# Patient Record
Sex: Male | Born: 1995 | Race: Black or African American | Hispanic: No | Marital: Single | Smoking: Current every day smoker
Health system: Southern US, Community
[De-identification: ages and names within clinical notes are randomized; demographics above are authoritative.]

## PROBLEM LIST (undated history)

## (undated) DIAGNOSIS — J45909 Unspecified asthma, uncomplicated: Secondary | ICD-10-CM

## (undated) DIAGNOSIS — F909 Attention-deficit hyperactivity disorder, unspecified type: Secondary | ICD-10-CM

## (undated) DIAGNOSIS — F319 Bipolar disorder, unspecified: Secondary | ICD-10-CM

## (undated) DIAGNOSIS — M1990 Unspecified osteoarthritis, unspecified site: Secondary | ICD-10-CM

## (undated) DIAGNOSIS — F39 Unspecified mood [affective] disorder: Secondary | ICD-10-CM

## (undated) DIAGNOSIS — F259 Schizoaffective disorder, unspecified: Secondary | ICD-10-CM

## (undated) DIAGNOSIS — F4310 Post-traumatic stress disorder, unspecified: Secondary | ICD-10-CM

## (undated) DIAGNOSIS — F329 Major depressive disorder, single episode, unspecified: Secondary | ICD-10-CM

## (undated) DIAGNOSIS — F913 Oppositional defiant disorder: Secondary | ICD-10-CM

## (undated) DIAGNOSIS — F32A Depression, unspecified: Secondary | ICD-10-CM

## (undated) HISTORY — DX: Unspecified mood (affective) disorder: F39

## (undated) HISTORY — DX: Bipolar disorder, unspecified: F31.9

## (undated) HISTORY — DX: Attention-deficit hyperactivity disorder, unspecified type: F90.9

## (undated) HISTORY — PX: NO PAST SURGERIES: SHX2092

## (undated) HISTORY — DX: Oppositional defiant disorder: F91.3

---
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
## 2013-01-07 ENCOUNTER — Emergency Department (HOSPITAL_COMMUNITY)
Admission: EM | Admit: 2013-01-07 | Discharge: 2013-01-07 | Disposition: A | Discharge status: Other Acute Inpatient Hospital | Payer: Medicaid Other | Attending: Emergency Medicine | Admitting: Emergency Medicine

## 2013-01-07 ENCOUNTER — Encounter (HOSPITAL_COMMUNITY): Payer: Self-pay | Admitting: Family Medicine

## 2013-01-07 DIAGNOSIS — R45851 Suicidal ideations: Secondary | ICD-10-CM | POA: Insufficient documentation

## 2013-01-07 DIAGNOSIS — M545 Low back pain, unspecified: Secondary | ICD-10-CM | POA: Insufficient documentation

## 2013-01-07 DIAGNOSIS — F913 Oppositional defiant disorder: Secondary | ICD-10-CM | POA: Insufficient documentation

## 2013-01-07 DIAGNOSIS — Z79899 Other long term (current) drug therapy: Secondary | ICD-10-CM | POA: Insufficient documentation

## 2013-01-07 DIAGNOSIS — F319 Bipolar disorder, unspecified: Secondary | ICD-10-CM | POA: Insufficient documentation

## 2013-01-07 DIAGNOSIS — F17200 Nicotine dependence, unspecified, uncomplicated: Secondary | ICD-10-CM | POA: Insufficient documentation

## 2013-01-07 DIAGNOSIS — Z8659 Personal history of other mental and behavioral disorders: Secondary | ICD-10-CM | POA: Insufficient documentation

## 2013-01-07 LAB — RAPID URINE DRUG SCREEN, HOSP PERFORMED
Cocaine: NOT DETECTED
Opiates: NOT DETECTED
Tetrahydrocannabinol: NOT DETECTED

## 2013-01-07 LAB — COMPREHENSIVE METABOLIC PANEL
ALT: 157 U/L — ABNORMAL HIGH (ref 0–53)
Albumin: 4.1 g/dL (ref 3.5–5.2)
Alkaline Phosphatase: 97 U/L (ref 39–117)
BUN: 9 mg/dL (ref 6–23)
Potassium: 3.9 mEq/L (ref 3.5–5.1)
Sodium: 139 mEq/L (ref 135–145)
Total Protein: 7.1 g/dL (ref 6.0–8.3)

## 2013-01-07 LAB — CBC
MCHC: 34.7 g/dL (ref 30.0–36.0)
RDW: 13.4 % (ref 11.5–15.5)

## 2013-01-07 LAB — ETHANOL: Alcohol, Ethyl (B): <11 mg/dL (ref 0–11)

## 2013-01-07 MED ORDER — LORAZEPAM 1 MG PO TABS
1.0000 mg | ORAL_TABLET | Freq: Once | ORAL | Status: AC
  Administered 2013-01-07: 1 mg via ORAL
  Filled 2013-01-07: qty 2

## 2013-01-07 MED ORDER — ACETAMINOPHEN 325 MG PO TABS
650.0000 mg | ORAL_TABLET | Freq: Once | ORAL | Status: AC
  Administered 2013-01-07: 650 mg via ORAL
  Filled 2013-01-07: qty 2

## 2013-01-07 NOTE — ED Provider Notes (Signed)
Disposition has not been able to be arranged and does not appear to be Advertising account executive. Patient will be transferred to [facility] where she can be kept in a psychiatric holding area pending disposition. Case is been discussed with Dr. Patria Mane who agrees to accept the patient in transfer.  Dione Booze, MD 01/07/13 2259

## 2013-01-07 NOTE — ED Notes (Signed)
Pt. Currently denies SI/HI. He does state that he does not have someone to pick him up. Allowed to take pillow and blanket with him to lobby to use while he waits for ride. All property returned to Pt. Before d/c.

## 2013-01-07 NOTE — BH Assessment (Signed)
Assessment Note   Ryan Bartlett is an 17 y.o. male that presented today after being discharged from [facility] voicing ongoing suicidal ideation with plan and intent to hang self.  Pt admits "I am always suicidal and have been since I was [age].  I put on my discharge plans plan to hang myself after discharge today."  Pt has had multiple previous attempts, including overdose that had him medically admitted at [facility] in 2013.  Pt admittedly has nowhere to stay and was not able to get a bed at [facility] and was sent to [facility] for placement.  Pt then presented to [facility].  Pt admits that he is unable to contract for safety and states "I have nothing to live for."  Pt denies Cannibus or alcohol use since his admission to [facility].  Clinician contacted [facility] and they are currently at capacity but encouraged Clinician to send referral for review.  Axis I: BiPolar II Disorder; r/o Cannibus Abuse Axis II: r/o AntiSocial Personality Disorder Axis III:  Past Medical History  Diagnosis Date  . ADHD (attention deficit hyperactivity disorder)   . Unspecified episodic mood disorder   . Oppositional defiant disorder   . Bipolar disorder    Axis IV: economic problems, housing problems, other psychosocial or environmental problems, problems related to legal system/crime, problems related to social environment and problems with primary support group Axis V: 31-40 impairment in reality testing  Past Medical History:  Past Medical History  Diagnosis Date  . ADHD (attention deficit hyperactivity disorder)   . Unspecified episodic mood disorder   . Oppositional defiant disorder   . Bipolar disorder     Past Surgical History  Procedure Laterality Date  . No past surgeries      Family History:  Family History  Problem Relation Age of Onset  . Depression Mother     Social History:  reports that he has been smoking Cigarettes.  He has a 2.5 pack-year smoking history. He does not have any smokeless tobacco  history on file. He reports that he does not drink alcohol or use illicit drugs.  Additional Social History:  Alcohol / Drug Use Pain Medications: Yes; see MAR Prescriptions: Yes; see MAR Over the Counter: PRN History of alcohol / drug use?: Yes Longest period of sobriety (when/how long): except for week at [facility] none in years Negative Consequences of Use: Financial;Legal;Personal relationships;Work / Science writer Symptoms: Agitation;Patient aware of relationship between substance abuse and physical/medical complications;Irritability Substance #1 Name of Substance 1: Cannibus 1 - Age of First Use: 12 1 - Amount (size/oz): 4-5 blunts 1 - Frequency: QD 1 - Duration: years 1 - Last Use / Amount: 9 days Substance #2 Name of Substance 2: ETOH 2 - Age of First Use: 12 2 - Amount (size/oz): several drinks 2 - Frequency: every few days 2 - Duration: years 2 - Last Use / Amount: last night- 5/20  CIWA: CIWA-Ar BP: 142/77 mmHg Pulse Rate: 117 COWS:    Allergies: No Known Allergies  Home Medications:  (Not in a hospital admission)  OB/GYN Status:  No LMP for male patient.  General Assessment Data Location of Assessment: [facility] ED Living Arrangements: Other (Comment) (Homeless on the street) Can pt return to current living arrangement?: No Admission Status: Voluntary Is patient capable of signing voluntary admission?: Yes Transfer from: Acute Hospital Referral Source: Other Museum/gallery curator)  Education Status Is patient currently in school?: Yes Current Grade: working on GED Highest grade of school patient has completed: 10th  Name of school: [employer]  Risk to self Suicidal Ideation: Yes-Currently Present Suicidal Intent: Yes-Currently Present Is patient at risk for suicide?: Yes Suicidal Plan?: Yes-Currently Present Specify Current Suicidal Plan: to hang self Access to Means: Yes Specify Access to Suicidal Means: available cords and blankets What has been your use of drugs/alcohol  within the last 12 months?: Cannibus and occassional alcohol use Previous Attempts/Gestures: Yes How many times?:  (multiple) Other Self Harm Risks: impulsive and destructive Triggers for Past Attempts: Other personal contacts;Unpredictable Intentional Self Injurious Behavior: Damaging Comment - Self Injurious Behavior: chronicity of mental illness Family Suicide History: No Recent stressful life event(s): Conflict (Comment);Turmoil (Comment) (released from [facility] while still suicidal; legal charges) Persecutory voices/beliefs?: Yes Depression: Yes Depression Symptoms: Guilt;Loss of interest in usual pleasures;Feeling worthless/self pity Substance abuse history and/or treatment for substance abuse?: Yes Suicide prevention information given to non-admitted patients: Not applicable  Risk to Others Homicidal Ideation: No Thoughts of Harm to Others: No Current Homicidal Intent: No Current Homicidal Plan: No Access to Homicidal Means: No Identified Victim: none per pt History of harm to others?: Yes Assessment of Violence: In past 6-12 months Violent Behavior Description: charges for resisting arrest Does patient have access to weapons?: Yes (Comment) (knives and possible guns available when not in ED) Criminal Charges Pending?: Yes Describe Pending Criminal Charges: Trespassing, Larceny and Conspiracy, Damage to Real Property, Resisting Arrest Does patient have a court date: No  Psychosis Hallucinations: Auditory (hears faint voices calling his name) Delusions: None noted  Mental Status Report Appear/Hygiene:  (casual in scrubs) Eye Contact: Good Motor Activity: Unremarkable Speech: Logical/coherent Level of Consciousness: Alert Mood: Depressed Affect: Apathetic;Depressed;Sad Anxiety Level: Minimal Thought Processes: Relevant Judgement: Impaired Orientation: Person;Place;Time;Situation Obsessive Compulsive Thoughts/Behaviors: Moderate  Cognitive Functioning Concentration:  Normal Memory: Recent Intact;Remote Intact IQ: Average Insight: Poor Impulse Control: Poor Appetite: Good Weight Loss: 0 Weight Gain: 0 Sleep: No Change Total Hours of Sleep:  ("about six") Vegetative Symptoms: None  ADLScreening [facility] Assessment Services) Patient's cognitive ability adequate to safely complete daily activities?: Yes Patient able to express need for assistance with ADLs?: Yes Independently performs ADLs?: Yes (appropriate for developmental age)  Abuse/Neglect [facility]) Physical Abuse: Denies Verbal Abuse: Denies Sexual Abuse: Denies  Prior Inpatient Therapy Prior Inpatient Therapy: Yes Prior Therapy Dates: discharged today Prior Therapy Facilty/Provider(s): [facility] today and multiple other hospitalizations Reason for Treatment: SI  Prior Outpatient Therapy Prior Outpatient Therapy: Yes Prior Therapy Dates: currently Prior Therapy Facilty/Provider(s): [facility] Reason for Treatment: ongoing and chronic Si  ADL Screening (condition at time of admission) Patient's cognitive ability adequate to safely complete daily activities?: Yes Patient able to express need for assistance with ADLs?: Yes Independently performs ADLs?: Yes (appropriate for developmental age) Weakness of Legs: None Weakness of Arms/Hands: None       Abuse/Neglect Assessment (Assessment to be complete while patient is alone) Physical Abuse: Denies Verbal Abuse: Denies Sexual Abuse: Denies Exploitation of patient/patient's resources: Denies Self-Neglect: Denies Values / Beliefs Cultural Requests During Hospitalization: None Spiritual Requests During Hospitalization: None   Advance Directives (For Healthcare) Advance Directive: Patient does not have advance directive    Additional Information 1:1 In Past 12 Months?: Yes CIRT Risk: No Elopement Risk: No Does patient have medical clearance?: Yes     Disposition:  Please consider for inpatient treatment. Disposition Initial Assessment  Completed for this Encounter: Yes Disposition of Patient: Inpatient treatment program Type of inpatient treatment program: Adult  On Site Evaluation by:   Reviewed with Physician:  Angelica Ran 01/07/2013 6:41 PM

## 2013-01-07 NOTE — ED Notes (Signed)
Report given to K. Cobb, RN. 

## 2013-01-07 NOTE — ED Notes (Signed)
Called house coverage to verify if sitter had been called by triage nurse. She had not so sitter was requested.

## 2013-01-07 NOTE — ED Notes (Signed)
Report received from Ulla Gallo, RN-

## 2013-01-07 NOTE — ED Notes (Signed)
Per pt he has been having thoughts of killing himself. sts [facility] sent him here. Denies plan.

## 2013-01-07 NOTE — ED Provider Notes (Signed)
History    This chart was scribed for non-physician practitioner, Roxy Horseman PA-C  working with Vida Roller, MD by Donne Anon, ED Scribe. This patient was seen in room TR06C/TR06C and the patient's care was started at 1415.   CSN: [id number]  Arrival date & time 01/07/13  1420   First MD Initiated Contact with Patient 01/07/13 1454      Chief Complaint  Patient presents with  . Suicidal     The history is provided by the patient. No language interpreter was used.   HPI Comments: Ryan Bartlett is a 17 y.o. male who presents to the Emergency Department complaining of SI since he was 12. He denies any recent stressors. He was discharged from [facility] in [place] earlier today and he stated he said that if he was discharged he would hang himself. He states he still feels like harming himself. He is compliant with is medication. He denies HI. He states that he is experiencing gradual onset, mild lower back pain currently. He has tried 800 mg Ibuprofen with little relief. He states he used marijuana but stopped 2 weeks ago when he entered [facility]. He states he drinks alcohol on occasion. He denies CP, difficulty breathing, fever, nausea, vomiting or any other pain.   Past Medical History  Diagnosis Date  . ADHD (attention deficit hyperactivity disorder)   . Unspecified episodic mood disorder   . Oppositional defiant disorder   . Bipolar disorder     Past Surgical History  Procedure Laterality Date  . No past surgeries      Family History  Problem Relation Age of Onset  . Depression Mother     History  Substance Use Topics  . Smoking status: Current Some Day Smoker -- 0.50 packs/day for 5 years    Types: Cigarettes  . Smokeless tobacco: Not on file  . Alcohol Use: No     Comment: Pt denies use      Review of Systems  Respiratory: Negative for shortness of breath.   Cardiovascular: Negative for chest pain.  Gastrointestinal: Negative for nausea and  vomiting.  Musculoskeletal: Positive for back pain.  Psychiatric/Behavioral: Positive for suicidal ideas.  All other systems reviewed and are negative.    Allergies  Review of patient's allergies indicates no known allergies.  Home Medications   Current Outpatient Rx  Name  Route  Sig  Dispense  Refill  . ibuprofen (ADVIL,MOTRIN) 200 MG tablet   Oral   Take 200 mg by mouth every 6 (six) hours as needed for pain.         Marland Kitchen PARoxetine (PAXIL) 10 MG tablet   Oral   Take 10 mg by mouth every morning.           BP 142/77  Pulse 117  Temp(Src) 98.3 F (36.8 C)  Resp 18  SpO2 98%  Physical Exam  Nursing note and vitals reviewed. Constitutional: He is oriented to person, place, and time. He appears well-developed and well-nourished. No distress.  HENT:  Head: Normocephalic and atraumatic.  Eyes: EOM are normal.  Neck: Neck supple. No tracheal deviation present.  Cardiovascular: Normal heart sounds.   Tachycardiac   Pulmonary/Chest: Effort normal and breath sounds normal. No respiratory distress. He has no wheezes. He has no rales. He exhibits no tenderness.  Abdominal: Soft. He exhibits no distension. There is no tenderness.  Musculoskeletal: Normal range of motion.  Right sided rhomboid muscle groups tender to palpation.  Neurological: He is  alert and oriented to person, place, and time.  Sensation in strength intact throughout.  Skin: Skin is warm and dry.  Psychiatric: He has a normal mood and affect. His behavior is normal.    ED Course  Procedures (including critical care time) DIAGNOSTIC STUDIES: Oxygen Saturation is 98% on room air, normal by my interpretation.    COORDINATION OF CARE: 3:20 PM We are going to move the pt to pod C. Will discuss pt with ACT team. Order basic labs. Will reevaluate.   Labs Reviewed  CBC  COMPREHENSIVE METABOLIC PANEL  ETHANOL  ACETAMINOPHEN LEVEL  SALICYLATE LEVEL  URINE RAPID DRUG SCREEN (HOSP PERFORMED)   Results  for orders placed during the hospital encounter of 01/07/13  CBC      Result Value Range   WBC 7.6  4.0 - 10.5 K/uL   RBC 4.98  4.22 - 5.81 MIL/uL   Hemoglobin 14.2  13.0 - 17.0 g/dL   HCT 19.1  47.8 - 29.5 %   MCV 82.1  78.0 - 100.0 fL   MCH 28.5  26.0 - 34.0 pg   MCHC 34.7  30.0 - 36.0 g/dL   RDW 62.1  30.8 - 65.7 %   Platelets 201  150 - 400 K/uL  COMPREHENSIVE METABOLIC PANEL      Result Value Range   Sodium 139  135 - 145 mEq/L   Potassium 3.9  3.5 - 5.1 mEq/L   Chloride 102  96 - 112 mEq/L   CO2 25  19 - 32 mEq/L   Glucose, Bld 121 (*) 70 - 99 mg/dL   BUN 9  6 - 23 mg/dL   Creatinine, Ser 8.46  0.50 - 1.35 mg/dL   Calcium 9.2  8.4 - 96.2 mg/dL   Total Protein 7.1  6.0 - 8.3 g/dL   Albumin 4.1  3.5 - 5.2 g/dL   AST 47 (*) 0 - 37 U/L   ALT 157 (*) 0 - 53 U/L   Alkaline Phosphatase 97  39 - 117 U/L   Total Bilirubin 0.4  0.3 - 1.2 mg/dL   GFR calc non Af Amer >90  >90 mL/min   GFR calc Af Amer >90  >90 mL/min  ETHANOL      Result Value Range   Alcohol, Ethyl (B) <11  0 - 11 mg/dL  ACETAMINOPHEN LEVEL      Result Value Range   Acetaminophen (Tylenol), Serum <15.0  10 - 30 ug/mL  SALICYLATE LEVEL      Result Value Range   Salicylate Lvl <2.0 (*) 2.8 - 20.0 mg/dL  URINE RAPID DRUG SCREEN (HOSP PERFORMED)      Result Value Range   Opiates NONE DETECTED  NONE DETECTED   Cocaine NONE DETECTED  NONE DETECTED   Benzodiazepines NONE DETECTED  NONE DETECTED   Amphetamines NONE DETECTED  NONE DETECTED   Tetrahydrocannabinol NONE DETECTED  NONE DETECTED   Barbiturates NONE DETECTED  NONE DETECTED     Filed Vitals:   01/07/13 1830  BP: 127/78  Pulse: 80  Temp: 97.4 F (36.3 C)  Resp: 18     1. Suicidal behavior       MDM  Patient with SI. Plan is to consult the activity. Will order telepsych.  Patient medically clear.  Anticipate admit to [facility].  I personally performed the services described in this documentation, which was scribed in my presence. The recorded  information has been reviewed and is accurate.  Roxy Horseman, PA-C 01/07/13 2024

## 2013-01-07 NOTE — ED Notes (Addendum)
Pt states "I want to kill myself...Marland KitchenMarland KitchenI'm going to hang myself". When questioned why, he said "because I don't have anywhere to go". Pt was discharged from [facility] today,where he has been for 8 days. Before that, he was at [facility]. Pt HAS BEEN WANDED. Belongings at nurses station.

## 2013-01-08 ENCOUNTER — Encounter (HOSPITAL_COMMUNITY): Payer: Self-pay | Admitting: Emergency Medicine

## 2013-01-08 ENCOUNTER — Emergency Department (HOSPITAL_COMMUNITY)
Admission: EM | Admit: 2013-01-08 | Discharge: 2013-01-08 | Disposition: A | Discharge status: Home / Self Care | Payer: Medicaid Other | Attending: Emergency Medicine | Admitting: Emergency Medicine

## 2013-01-08 DIAGNOSIS — M545 Low back pain, unspecified: Secondary | ICD-10-CM | POA: Insufficient documentation

## 2013-01-08 DIAGNOSIS — Z8659 Personal history of other mental and behavioral disorders: Secondary | ICD-10-CM | POA: Insufficient documentation

## 2013-01-08 DIAGNOSIS — Z5900 Homelessness unspecified: Secondary | ICD-10-CM | POA: Insufficient documentation

## 2013-01-08 DIAGNOSIS — Z79899 Other long term (current) drug therapy: Secondary | ICD-10-CM | POA: Insufficient documentation

## 2013-01-08 DIAGNOSIS — F913 Oppositional defiant disorder: Secondary | ICD-10-CM | POA: Insufficient documentation

## 2013-01-08 DIAGNOSIS — F319 Bipolar disorder, unspecified: Secondary | ICD-10-CM | POA: Insufficient documentation

## 2013-01-08 DIAGNOSIS — F17200 Nicotine dependence, unspecified, uncomplicated: Secondary | ICD-10-CM | POA: Insufficient documentation

## 2013-01-08 DIAGNOSIS — F39 Unspecified mood [affective] disorder: Secondary | ICD-10-CM | POA: Insufficient documentation

## 2013-01-08 MED ORDER — CARBAMAZEPINE 200 MG PO TABS
200.0000 mg | ORAL_TABLET | Freq: Three times a day (TID) | ORAL | Status: DC
  Administered 2013-01-08: 200 mg via ORAL
  Filled 2013-01-08 (×3): qty 1

## 2013-01-08 MED ORDER — ZOLPIDEM TARTRATE 5 MG PO TABS: 5.0000 mg | ORAL_TABLET | Freq: Every evening | ORAL | Status: DC | PRN

## 2013-01-08 MED ORDER — ONDANSETRON HCL 4 MG PO TABS: 4.0000 mg | ORAL_TABLET | Freq: Three times a day (TID) | ORAL | Status: DC | PRN

## 2013-01-08 MED ORDER — ACETAMINOPHEN 325 MG PO TABS: 650.0000 mg | ORAL_TABLET | ORAL | Status: DC | PRN

## 2013-01-08 MED ORDER — QUETIAPINE FUMARATE 200 MG PO TABS
200.0000 mg | ORAL_TABLET | Freq: Every day | ORAL | Status: DC
Start: 2013-01-08 — End: 2013-01-08

## 2013-01-08 MED ORDER — PAROXETINE HCL 20 MG PO TABS
20.0000 mg | ORAL_TABLET | ORAL | Status: DC
  Administered 2013-01-08: 20 mg via ORAL
  Filled 2013-01-08 (×2): qty 1

## 2013-01-08 MED ORDER — NICOTINE 21 MG/24HR TD PT24
21.0000 mg | MEDICATED_PATCH | Freq: Every day | TRANSDERMAL | Status: DC
  Administered 2013-01-08: 21 mg via TRANSDERMAL
  Filled 2013-01-08: qty 1

## 2013-01-08 MED ORDER — IBUPROFEN 400 MG PO TABS: 600.0000 mg | ORAL_TABLET | Freq: Three times a day (TID) | ORAL | Status: DC | PRN

## 2013-01-08 MED ORDER — ALUM & MAG HYDROXIDE-SIMETH 200-200-20 MG/5ML PO SUSP: 30.0000 mL | ORAL | Status: DC | PRN

## 2013-01-08 NOTE — Progress Notes (Signed)
CSW met with the Pt at the bedside for homeless resources and referrals to [facility]: specifically ACT TEAM referral.   CSW had contacted Elissa Lovett [phone] at [facility] and Ms. Earna Coder would like Pt referral information faxed to [phone] for review. Ms. Earna Coder also stated that Pt would be a perfect candidate for ACT TEAM services.   Pt is aware of resources for ACT TEAM and gave permission to send information for referral. Pt was given two bus passes and information for [employer] and [facility] contact numbers.   Pt appreciative and voiced understanding of importance of updating [facility] of his location for assistance.   No further CSW needs.   Leron Croak, LCSWA [facility] Emergency Dept.  [phone]

## 2013-01-08 NOTE — ED Provider Notes (Signed)
Medical screening examination/treatment/procedure(s) were performed by non-physician practitioner and as supervising physician I was immediately available for consultation/collaboration.  Lesslie Mckeehan, MD 01/08/13 2134 

## 2013-01-08 NOTE — BH Assessment (Signed)
BHH Assessment Progress Note      ACT was consulted to see pt pending telepsych recommendations.  Pt received telepsych and discharge with outpatient referrals recommended as well as a social work consult for homeless resources.  Referrals given to pt and social work to be notified.

## 2013-01-08 NOTE — ED Notes (Signed)
Patient states he has been having trouble with suicidal thoughts since he was 17 years old. Patient has attempted in the past he states. Patients plan this morning was to take all of his medication that he has. Patient denies any thoughts of hurting anyone else. Patient states he has no triggers that causes him to have the suicidal thoughts he just feels like dying. Patient was placed in blue scrubs and all belongings in bags. Patient does have stress ball in room to help relieve with anxiety and stress. All cords from monitor and phone were removed from room. Will continue to monitor.

## 2013-01-08 NOTE — ED Provider Notes (Signed)
Medical screening examination/treatment/procedure(s) were performed by non-physician practitioner and as supervising physician I was immediately available for consultation/collaboration.    Vida Roller, MD 01/08/13 1359

## 2013-01-08 NOTE — ED Notes (Signed)
House coverage aware of pt.

## 2013-01-08 NOTE — ED Provider Notes (Signed)
History     CSN: [id number]  Arrival date & time 01/08/13  4098   First MD Initiated Contact with Patient 01/08/13 [phone]      Chief Complaint  Patient presents with  . Suicidal    (Consider location/radiation/quality/duration/timing/severity/associated sxs/prior treatment) HPI  Ryan Bartlett is a 17 y.o. male who admits to being homeless, presents to the Emergency Department complaining of SI since he was 12. He denies any recent stressors. He was discharged from [facility] in [place] two days ago and then ALSO discharged from our ER last night after a Telepsych said he could be discharged, slept in the waiting room and checked back in this morning.  He stated he said last night that if he was discharged he would hang himself. This morning he says he will overdose on his Seroquel.     Marland Kitchen He is compliant with is medication. He denies HI. He states that he is experiencing gradual onset, mild lower back pain currently. He has tried 800 mg Ibuprofen with little relief. He states he used marijuana but stopped 2 weeks ago.when he entered [facility]. He states he drinks alcohol on occasion. He denies CP, difficulty breathing, fever, nausea, vomiting or any other pain.    Past Medical History  Diagnosis Date  . ADHD (attention deficit hyperactivity disorder)   . Unspecified episodic mood disorder   . Oppositional defiant disorder   . Bipolar disorder     Past Surgical History  Procedure Laterality Date  . No past surgeries      Family History  Problem Relation Age of Onset  . Depression Mother     History  Substance Use Topics  . Smoking status: Current Some Day Smoker -- 0.50 packs/day for 5 years    Types: Cigarettes  . Smokeless tobacco: Not on file  . Alcohol Use: No     Comment: Pt denies use      Review of Systems  All other systems reviewed and are negative.    Allergies  Peanut-containing drug products and Lactose intolerance (gi)  Home Medications    Current Outpatient Rx  Name  Route  Sig  Dispense  Refill  . carbamazepine (TEGRETOL) 200 MG tablet   Oral   Take 200 mg by mouth 3 (three) times daily.         . cyclobenzaprine (FLEXERIL) 5 MG tablet   Oral   Take 5 mg by mouth 3 (three) times daily as needed for muscle spasms. For spasms         . ibuprofen (ADVIL,MOTRIN) 200 MG tablet   Oral   Take 800 mg by mouth every 6 (six) hours as needed for pain.          Marland Kitchen PARoxetine (PAXIL) 20 MG tablet   Oral   Take 20 mg by mouth every morning.         Marland Kitchen QUEtiapine (SEROQUEL) 100 MG tablet   Oral   Take 200 mg by mouth at bedtime.           BP 132/86  Pulse 82  Temp(Src) 97.7 F (36.5 C) (Oral)  Resp 19  SpO2 99%  Physical Exam  Nursing note and vitals reviewed. Constitutional: He appears well-developed and well-nourished. No distress.  HENT:  Head: Normocephalic and atraumatic.  Eyes: Pupils are equal, round, and reactive to light.  Neck: Normal range of motion. Neck supple.  Cardiovascular: Normal rate and regular rhythm.   Pulmonary/Chest: Effort normal.  Abdominal: Soft.  Neurological: He is alert.  Skin: Skin is warm and dry.  Psychiatric: He exhibits a depressed mood. He expresses suicidal ideation. He expresses no homicidal ideation. He expresses suicidal plans. He expresses no homicidal plans.    ED Course  Procedures (including critical care time)  Labs Reviewed - No data to display No results found.   1. Homelessness   2. Suicidal ideation       MDM   ACt consulted. Recommend Telepsych. If patient is recommended dc by telepsych will consult Social Work.  Holding orders placed. Med rec completed.      Dorthula Matas, PA-C 01/08/13 [phone]

## 2013-01-08 NOTE — ED Notes (Signed)
Patient comes in stating he is suicidal. Patient was seen here last night for same thing, was then discharged. Stayed out in the waiting room throughout the night woke up and feelings of wanting to hurt his self again. States his plan is take his medication all at once. Patient states he is homeless. Has been seen several times for these feelings.

## 2013-01-08 NOTE — BH Assessment (Signed)
BHH Assessment Progress Note      Counselor was working on placement with [facility] and [facility] (pt was recently dc from there, but currently no beds available).  Charge nurse told this Clinical research associate that he would be moved to [facility] for holding due to increasing number of patients and bed availability in their psych ED.  Charge nurse later informed this Clinical research associate that patient had been discharged.

## 2013-01-11 ENCOUNTER — Emergency Department (HOSPITAL_COMMUNITY)
Admission: EM | Admit: 2013-01-11 | Discharge: 2013-01-12 | Disposition: A | Discharge status: Other Acute Inpatient Hospital | Payer: Medicaid Other | Attending: Emergency Medicine | Admitting: Emergency Medicine

## 2013-01-11 ENCOUNTER — Encounter (HOSPITAL_COMMUNITY): Payer: Self-pay | Admitting: Emergency Medicine

## 2013-01-11 DIAGNOSIS — T1491XA Suicide attempt, initial encounter: Secondary | ICD-10-CM

## 2013-01-11 DIAGNOSIS — F909 Attention-deficit hyperactivity disorder, unspecified type: Secondary | ICD-10-CM | POA: Insufficient documentation

## 2013-01-11 DIAGNOSIS — F3162 Bipolar disorder, current episode mixed, moderate: Secondary | ICD-10-CM

## 2013-01-11 DIAGNOSIS — Z8659 Personal history of other mental and behavioral disorders: Secondary | ICD-10-CM | POA: Insufficient documentation

## 2013-01-11 DIAGNOSIS — F17200 Nicotine dependence, unspecified, uncomplicated: Secondary | ICD-10-CM | POA: Insufficient documentation

## 2013-01-11 DIAGNOSIS — F902 Attention-deficit hyperactivity disorder, combined type: Secondary | ICD-10-CM

## 2013-01-11 DIAGNOSIS — F912 Conduct disorder, adolescent-onset type: Secondary | ICD-10-CM | POA: Insufficient documentation

## 2013-01-11 DIAGNOSIS — IMO0002 Reserved for concepts with insufficient information to code with codable children: Secondary | ICD-10-CM | POA: Insufficient documentation

## 2013-01-11 DIAGNOSIS — R45851 Suicidal ideations: Secondary | ICD-10-CM | POA: Insufficient documentation

## 2013-01-11 DIAGNOSIS — F319 Bipolar disorder, unspecified: Secondary | ICD-10-CM | POA: Insufficient documentation

## 2013-01-11 LAB — BASIC METABOLIC PANEL
Calcium: 9.4 mg/dL (ref 8.4–10.5)
GFR calc non Af Amer: >90 mL/min (ref >90)
Glucose, Bld: 104 mg/dL — ABNORMAL HIGH (ref 70–99)
Potassium: 4.1 mEq/L (ref 3.5–5.1)
Sodium: 140 mEq/L (ref 135–145)

## 2013-01-11 LAB — RAPID URINE DRUG SCREEN, HOSP PERFORMED
Amphetamines: NOT DETECTED
Barbiturates: NOT DETECTED
Benzodiazepines: NOT DETECTED

## 2013-01-11 LAB — URINALYSIS, ROUTINE W REFLEX MICROSCOPIC
Bilirubin Urine: NEGATIVE
Glucose, UA: NEGATIVE mg/dL
Ketones, ur: NEGATIVE mg/dL
Leukocytes, UA: NEGATIVE
Nitrite: NEGATIVE
Protein, ur: NEGATIVE mg/dL
pH: 7.5 (ref 5.0–8.0)

## 2013-01-11 LAB — CBC WITH DIFFERENTIAL/PLATELET
Hemoglobin: 13.1 g/dL (ref 13.0–17.0)
Lymphocytes Relative: 25 % (ref 12–46)
Lymphs Abs: 1.9 10*3/uL (ref 0.7–4.0)
Monocytes Relative: 11 % (ref 3–12)
Neutro Abs: 4.9 10*3/uL (ref 1.7–7.7)
Neutrophils Relative %: 65 % (ref 43–77)
Platelets: 210 10*3/uL (ref 150–400)
RBC: 4.86 MIL/uL (ref 4.22–5.81)
WBC: 7.5 10*3/uL (ref 4.0–10.5)

## 2013-01-11 MED ORDER — NICOTINE 21 MG/24HR TD PT24
21.0000 mg | MEDICATED_PATCH | Freq: Every day | TRANSDERMAL | Status: DC
Start: 2013-01-12 — End: 2013-01-12
  Administered 2013-01-12: 21 mg via TRANSDERMAL
  Filled 2013-01-11: qty 1

## 2013-01-11 MED ORDER — ZOLPIDEM TARTRATE 5 MG PO TABS: 5.0000 mg | ORAL_TABLET | Freq: Every evening | ORAL | Status: DC | PRN

## 2013-01-11 MED ORDER — ACETAMINOPHEN 325 MG PO TABS: 650.0000 mg | ORAL_TABLET | ORAL | Status: DC | PRN

## 2013-01-11 MED ORDER — LORAZEPAM 1 MG PO TABS: 1.0000 mg | ORAL_TABLET | Freq: Three times a day (TID) | ORAL | Status: DC | PRN

## 2013-01-11 MED ORDER — IBUPROFEN 600 MG PO TABS
600.0000 mg | ORAL_TABLET | Freq: Three times a day (TID) | ORAL | Status: DC | PRN
  Administered 2013-01-12 (×2): 600 mg via ORAL
  Filled 2013-01-11 (×2): qty 1

## 2013-01-11 MED ORDER — ONDANSETRON HCL 4 MG PO TABS: 4.0000 mg | ORAL_TABLET | Freq: Three times a day (TID) | ORAL | Status: DC | PRN

## 2013-01-11 NOTE — ED Notes (Signed)
Move to RM 38

## 2013-01-11 NOTE — ED Notes (Signed)
Pt states that he began feeling suicidal at 12. Has several plans including overdose, hanging, and other previous methods. Pt wwent to [facility] at 0500 this morning and was waiting for admission when he was sent home. Pt came to [facility].

## 2013-01-11 NOTE — ED Notes (Addendum)
Pt transferred from triage presents, SI with plan to electrocute self., Denies HI.  Pt states [facility] Dced him and he presented here for futher evaluation. Admits to drinking 4 40 0z beers per day., marijuana & cocaine abuse. Feeling hopeless. Diag. With Bipolar, Anxiety, Depression. Pt on home monitor,ankle bracelet as a stipulation for being out.  Wil not elaborate further. Pt also reports he has tried SI with the past 30 days by running into brick walls.  Pt calm & cooperative at present. Tech Art at bedside while bracelet is charging.

## 2013-01-11 NOTE — ED Notes (Signed)
Pt has prescriptions for his psy meds in with his belongings.

## 2013-01-11 NOTE — ED Provider Notes (Signed)
History     CSN: [id number]  Arrival date & time 01/11/13  2049   First MD Initiated Contact with Patient 01/11/13 2135      Chief Complaint  Patient presents with  . Suicidal    (Consider location/radiation/quality/duration/timing/severity/associated sxs/prior treatment) HPI Comments: Patient with long standing Psy Hx presents tonight stating he has a plan to electrocute himself with his ankle monitor.  He was at [facility] this morning and discharged , he was at [facility] for 3 days and discharged 5 days ago.  Currently staying at the shelter, did not finish high school, his mother put him out of her home and does not work    The history is provided by the patient.    Past Medical History  Diagnosis Date  . ADHD (attention deficit hyperactivity disorder)   . Unspecified episodic mood disorder   . Oppositional defiant disorder   . Bipolar disorder     Past Surgical History  Procedure Laterality Date  . No past surgeries      Family History  Problem Relation Age of Onset  . Depression Mother     History  Substance Use Topics  . Smoking status: Current Every Day Smoker -- 1.00 packs/day for 5 years    Types: Cigarettes  . Smokeless tobacco: Never Used  . Alcohol Use: 3.6 oz/week    6 Cans of beer per week      Review of Systems  Constitutional: Negative for activity change and appetite change.  Eyes: Negative for visual disturbance.  Respiratory: Negative for cough.   Gastrointestinal: Negative for abdominal pain.  Skin: Negative for rash.  Neurological: Negative for headaches.  Psychiatric/Behavioral: Positive for suicidal ideas.    Allergies  Peanut-containing drug products and Lactose intolerance (gi)  Home Medications   No current outpatient prescriptions on file.  BP 125/81  Pulse 83  Temp(Src) 98.6 F (37 C) (Oral)  Resp 16  SpO2 100%  Physical Exam  Nursing note and vitals reviewed. Constitutional: He appears well-developed and  well-nourished.  HENT:  Head: Normocephalic and atraumatic.  Eyes: Pupils are equal, round, and reactive to light.  Neck: Normal range of motion.  Cardiovascular: Normal rate and regular rhythm.   Pulmonary/Chest: Effort normal and breath sounds normal.  Abdominal: Soft. Bowel sounds are normal.  Musculoskeletal: Normal range of motion. He exhibits no edema and no tenderness.  Electric monitoring device R ankle  Skin: Skin is warm and dry.  Psychiatric: He has a normal mood and affect. His speech is normal. He is agitated. Cognition and memory are normal. He expresses inappropriate judgment. He expresses suicidal ideation. He expresses suicidal plans.    ED Course  Procedures (including critical care time)  Labs Reviewed  URINALYSIS, ROUTINE W REFLEX MICROSCOPIC - Abnormal; Notable for the following:    Urobilinogen, UA 2.0 (*)    All other components within normal limits  BASIC METABOLIC PANEL - Abnormal; Notable for the following:    Glucose, Bld 104 (*)    All other components within normal limits  URINE RAPID DRUG SCREEN (HOSP PERFORMED)  CBC WITH DIFFERENTIAL   No results found.   1. Suicide attempt   2. ADHD (attention deficit hyperactivity disorder), combined type   3. Bipolar affective disorder, mixed, moderate   4. Conduct disorder, adolescent onset type   5. Suicidal ideation       MDM           Arman Filter, NP 01/16/13 [phone]

## 2013-01-12 ENCOUNTER — Encounter (HOSPITAL_COMMUNITY): Payer: Self-pay | Admitting: Behavioral Health

## 2013-01-12 ENCOUNTER — Inpatient Hospital Stay (HOSPITAL_COMMUNITY)
Admission: AD | Admit: 2013-01-12 | Discharge: 2013-01-19 | DRG: 885 | Disposition: A | Discharge status: Home / Self Care | Payer: Medicaid Other | Source: Intra-hospital | Attending: Psychiatry | Admitting: Psychiatry

## 2013-01-12 DIAGNOSIS — F329 Major depressive disorder, single episode, unspecified: Secondary | ICD-10-CM

## 2013-01-12 DIAGNOSIS — T1491XA Suicide attempt, initial encounter: Secondary | ICD-10-CM

## 2013-01-12 DIAGNOSIS — R45851 Suicidal ideations: Secondary | ICD-10-CM

## 2013-01-12 DIAGNOSIS — R443 Hallucinations, unspecified: Secondary | ICD-10-CM

## 2013-01-12 DIAGNOSIS — F909 Attention-deficit hyperactivity disorder, unspecified type: Secondary | ICD-10-CM | POA: Diagnosis present

## 2013-01-12 DIAGNOSIS — Z79899 Other long term (current) drug therapy: Secondary | ICD-10-CM

## 2013-01-12 DIAGNOSIS — F919 Conduct disorder, unspecified: Secondary | ICD-10-CM | POA: Diagnosis present

## 2013-01-12 DIAGNOSIS — F39 Unspecified mood [affective] disorder: Secondary | ICD-10-CM

## 2013-01-12 DIAGNOSIS — F912 Conduct disorder, adolescent-onset type: Secondary | ICD-10-CM

## 2013-01-12 DIAGNOSIS — F902 Attention-deficit hyperactivity disorder, combined type: Secondary | ICD-10-CM | POA: Diagnosis present

## 2013-01-12 DIAGNOSIS — F3162 Bipolar disorder, current episode mixed, moderate: Principal | ICD-10-CM | POA: Diagnosis present

## 2013-01-12 DIAGNOSIS — F1210 Cannabis abuse, uncomplicated: Secondary | ICD-10-CM | POA: Diagnosis present

## 2013-01-12 MED ORDER — HYDROXYZINE HCL 25 MG PO TABS: 25.0000 mg | ORAL_TABLET | Freq: Four times a day (QID) | ORAL | Status: DC | PRN

## 2013-01-12 MED ORDER — MAGNESIUM HYDROXIDE 400 MG/5ML PO SUSP: 30.0000 mL | Freq: Every day | ORAL | Status: DC | PRN

## 2013-01-12 MED ORDER — PAROXETINE HCL 30 MG PO TABS
30.0000 mg | ORAL_TABLET | Freq: Every day | ORAL | Status: DC
  Administered 2013-01-13: 30 mg via ORAL
  Filled 2013-01-12: qty 1
  Filled 2013-01-12: qty 3
  Filled 2013-01-12: qty 1

## 2013-01-12 MED ORDER — VITAMIN B-1 100 MG PO TABS
100.0000 mg | ORAL_TABLET | Freq: Every day | ORAL | Status: DC
  Filled 2013-01-12: qty 1

## 2013-01-12 MED ORDER — ONDANSETRON 4 MG PO TBDP: 4.0000 mg | ORAL_TABLET | Freq: Four times a day (QID) | ORAL | Status: DC | PRN

## 2013-01-12 MED ORDER — ADULT MULTIVITAMIN W/MINERALS CH
1.0000 | ORAL_TABLET | Freq: Every day | ORAL | Status: DC
  Filled 2013-01-12 (×2): qty 1

## 2013-01-12 MED ORDER — ALUM & MAG HYDROXIDE-SIMETH 200-200-20 MG/5ML PO SUSP
30.0000 mL | ORAL | Status: DC | PRN
  Administered 2013-01-12: 30 mL via ORAL

## 2013-01-12 MED ORDER — LOPERAMIDE HCL 2 MG PO CAPS: 2.0000 mg | ORAL_CAPSULE | ORAL | Status: DC | PRN

## 2013-01-12 MED ORDER — QUETIAPINE FUMARATE ER 200 MG PO TB24
200.0000 mg | ORAL_TABLET | Freq: Every day | ORAL | Status: DC
  Filled 2013-01-12: qty 1

## 2013-01-12 MED ORDER — QUETIAPINE FUMARATE ER 200 MG PO TB24
200.0000 mg | ORAL_TABLET | Freq: Every day | ORAL | Status: DC
  Administered 2013-01-12: 200 mg via ORAL
  Filled 2013-01-12 (×3): qty 1

## 2013-01-12 MED ORDER — CARBAMAZEPINE 200 MG PO TABS
200.0000 mg | ORAL_TABLET | Freq: Three times a day (TID) | ORAL | Status: DC
  Administered 2013-01-13: 200 mg via ORAL
  Filled 2013-01-12 (×5): qty 1

## 2013-01-12 MED ORDER — THIAMINE HCL 100 MG/ML IJ SOLN: 100.0000 mg | Freq: Once | INTRAMUSCULAR | Status: DC

## 2013-01-12 MED ORDER — ACETAMINOPHEN 325 MG PO TABS
650.0000 mg | ORAL_TABLET | Freq: Four times a day (QID) | ORAL | Status: DC | PRN
  Administered 2013-01-14: 650 mg via ORAL

## 2013-01-12 MED ORDER — CHLORDIAZEPOXIDE HCL 25 MG PO CAPS: 25.0000 mg | ORAL_CAPSULE | Freq: Four times a day (QID) | ORAL | Status: DC | PRN

## 2013-01-12 MED ORDER — CARBAMAZEPINE 200 MG PO TABS
200.0000 mg | ORAL_TABLET | Freq: Three times a day (TID) | ORAL | Status: DC
  Administered 2013-01-12: 200 mg via ORAL
  Filled 2013-01-12 (×3): qty 1

## 2013-01-12 MED ORDER — PAROXETINE HCL 30 MG PO TABS
30.0000 mg | ORAL_TABLET | Freq: Every day | ORAL | Status: DC
  Administered 2013-01-12: 30 mg via ORAL
  Filled 2013-01-12 (×2): qty 1

## 2013-01-12 NOTE — Progress Notes (Addendum)
CSW left message for [facility] on crisis line, and unable to reach [facility] on local lines of [phone] or [phone] or [phone]. CSW called [employer] who is also unable to reach [employer] regarding patient. CSW discussed case with NP who plans to evaluate patient. Patient continues to endorse SI with plan to electrocute self. Patient also continues to endorse AH, telling patient to electrocute self.   Catha Gosselin, LCSWA  [phone] .01/12/2013 11:41am

## 2013-01-12 NOTE — Tx Team (Signed)
Initial Interdisciplinary Treatment Plan  PATIENT STRENGTHS: (choose at least two) Ability for insight Capable of independent living Motivation for treatment/growth  PATIENT STRESSORS: Educational concerns Financial difficulties Substance abuse   PROBLEM LIST: Problem List/Patient Goals Date to be addressed Date deferred Reason deferred Estimated date of resolution  SI 01/12/13     Psychosis 01/12/13                                                DISCHARGE CRITERIA:  Ability to meet basic life and health needs Adequate post-discharge living arrangements Improved stabilization in mood, thinking, and/or behavior Verbal commitment to aftercare and medication compliance  PRELIMINARY DISCHARGE PLAN: Attend aftercare/continuing care group Attend PHP/IOP  PATIENT/FAMIILY INVOLVEMENT: This treatment plan has been presented to and reviewed with the patient, Ryan Bartlett, and/or family member.  The patient and family have been given the opportunity to ask questions and make suggestions.  Takyah Ciaramitaro L 01/12/2013, 5:58 PM

## 2013-01-12 NOTE — Progress Notes (Addendum)
Addendum: EDP discussed with AC, patient accepted to [facility] as EDP and NP feel patient needs inpatient treatment.  Catha Gosselin, Theresia Majors  [phone] .01/12/2013 1532pm  Addendum: CSW discussed with AC, stating that patient was declined by administration. Per administration requesting telepsych consult. Patient was seen by NP who recommended in patient. CSW informed EDP. EDP requesting to speak with [facility] administration. Patient shared with CSW that he is currently residing at [facility] and would like to return when psychiatrically stable. CSW confirmed with [facility] that patient is a resident there, and patient can return when stable for discharge.   Patient accepted to 400-1 [facility] to Dr. Jannifer Franklin. CSW informed Rn and EDP. CSW completed support paperwork and faxed to [facility] and given to RN.   Marland KitchenCatha Gosselin, Theresia Majors  [phone] 01/12/2013.d 1457pm

## 2013-01-12 NOTE — BH Assessment (Signed)
BHH Assessment Progress Note  Pt was ran by Rosey Bath at [facility], per [place] a telepsych needed to be done. Per Tresa Endo if the telepsych recommends inpt treatment then the patient needed to referred back to [facility], and if [facility] had no beds then seek placement elsewhere. All information was reported to Melynda Ripple at [facility].

## 2013-01-12 NOTE — ED Notes (Signed)
telepsych cancelled, report called to [facility] for admission.

## 2013-01-12 NOTE — ED Provider Notes (Signed)
Alert, pleasant cooperative, [phone]. Offers no complaint  Doug Sou, MD 01/12/13 [phone]

## 2013-01-12 NOTE — Consult Note (Signed)
Reason for Consult: Suicidal thought Referring Physician: KAMARIAN SAHAKIAN is an 17 y.o. male.  HPI: 17 years old [name] was brought in from [facility] yesterday by an Technical sales engineer for endorsing suicidal thoughts with plans to electrocute himself.  He was discharged from [facility] and requested to be brought here.  There is ongoing hx of same presentation at [facility] hospital ER after he was discharged from [facility].  Today during this encounter, he re[ports he  still feeling suicidal and plan is to electrocute himself.  He reports his depression is 10/10 and he also stated his medications does not relieve his depression or relieve his suicidal thoughts.  He reports he is homeless but later stated he stays at a shelter but does not feel safe  going back there because of the thoughts of hurting himself.  He denies HI/Visual hallucination.  We will monitor for safety at this time and reevaluate for appropriate disposition.  Past Medical History  Diagnosis Date  . ADHD (attention deficit hyperactivity disorder)   . Unspecified episodic mood disorder   . Oppositional defiant disorder   . Bipolar disorder     Past Surgical History  Procedure Laterality Date  . No past surgeries      Family History  Problem Relation Age of Onset  . Depression Mother     Social History:  reports that he has been smoking Cigarettes.  He has a 5 pack-year smoking history. He has never used smokeless tobacco. He reports that he drinks about 3.6 ounces of alcohol per week. He reports that he uses illicit drugs (Marijuana and Cocaine).  Allergies:  Allergies  Allergen Reactions  . Peanut-Containing Drug Products Anaphylaxis  . Lactose Intolerance (Gi) Diarrhea and Nausea And Vomiting    Medications: I have reviewed the patient's current medications.  Results for orders placed during the hospital encounter of 01/11/13 (from the past 48 hour(s))  URINALYSIS, ROUTINE W REFLEX MICROSCOPIC     Status:  Abnormal   Collection Time    01/11/13  9:21 PM      Result Value Range   Color, Urine YELLOW  YELLOW   APPearance CLEAR  CLEAR   Specific Gravity, Urine 1.030  1.005 - 1.030   pH 7.5  5.0 - 8.0   Glucose, UA NEGATIVE  NEGATIVE mg/dL   Hgb urine dipstick NEGATIVE  NEGATIVE   Bilirubin Urine NEGATIVE  NEGATIVE   Ketones, ur NEGATIVE  NEGATIVE mg/dL   Protein, ur NEGATIVE  NEGATIVE mg/dL   Urobilinogen, UA 2.0 (*) 0.0 - 1.0 mg/dL   Nitrite NEGATIVE  NEGATIVE   Leukocytes, UA NEGATIVE  NEGATIVE   Comment: MICROSCOPIC NOT DONE ON URINES WITH NEGATIVE PROTEIN, BLOOD, LEUKOCYTES, NITRITE, OR GLUCOSE <1000 mg/dL.  URINE RAPID DRUG SCREEN (HOSP PERFORMED)     Status: None   Collection Time    01/11/13  9:21 PM      Result Value Range   Opiates NONE DETECTED  NONE DETECTED   Cocaine NONE DETECTED  NONE DETECTED   Benzodiazepines NONE DETECTED  NONE DETECTED   Amphetamines NONE DETECTED  NONE DETECTED   Tetrahydrocannabinol NONE DETECTED  NONE DETECTED   Barbiturates NONE DETECTED  NONE DETECTED   Comment:            DRUG SCREEN FOR MEDICAL PURPOSES     ONLY.  IF CONFIRMATION IS NEEDED     FOR ANY PURPOSE, NOTIFY LAB     WITHIN 5 DAYS.  LOWEST DETECTABLE LIMITS     FOR URINE DRUG SCREEN     Drug Class       Cutoff (ng/mL)     Amphetamine      1000     Barbiturate      200     Benzodiazepine   200     Tricyclics       300     Opiates          300     Cocaine          300     THC              50  BASIC METABOLIC PANEL     Status: Abnormal   Collection Time    01/11/13 10:18 PM      Result Value Range   Sodium 140  135 - 145 mEq/L   Potassium 4.1  3.5 - 5.1 mEq/L   Chloride 103  96 - 112 mEq/L   CO2 28  19 - 32 mEq/L   Glucose, Bld 104 (*) 70 - 99 mg/dL   BUN 12  6 - 23 mg/dL   Creatinine, Ser 1.61  0.50 - 1.35 mg/dL   Calcium 9.4  8.4 - 09.6 mg/dL   GFR calc non Af Amer >90  >90 mL/min   GFR calc Af Amer >90  >90 mL/min   Comment:            The eGFR has  been calculated     using the CKD EPI equation.     This calculation has not been     validated in all clinical     situations.     eGFR's persistently     <90 mL/min signify     possible Chronic Kidney Disease.  CBC WITH DIFFERENTIAL     Status: None   Collection Time    01/11/13 10:18 PM      Result Value Range   WBC 7.5  4.0 - 10.5 K/uL   RBC 4.86  4.22 - 5.81 MIL/uL   Hemoglobin 13.1  13.0 - 17.0 g/dL   HCT 04.5  40.9 - 81.1 %   MCV 82.1  78.0 - 100.0 fL   MCH 27.0  26.0 - 34.0 pg   MCHC 32.8  30.0 - 36.0 g/dL   RDW 91.4  78.2 - 95.6 %   Platelets 210  150 - 400 K/uL   Neutrophils Relative % 65  43 - 77 %   Neutro Abs 4.9  1.7 - 7.7 K/uL   Lymphocytes Relative 25  12 - 46 %   Lymphs Abs 1.9  0.7 - 4.0 K/uL   Monocytes Relative 11  3 - 12 %   Monocytes Absolute 0.8  0.1 - 1.0 K/uL   Eosinophils Relative 0  0 - 5 %   Eosinophils Absolute 0.0  0.0 - 0.7 K/uL   Basophils Relative 0  0 - 1 %   Basophils Absolute 0.0  0.0 - 0.1 K/uL    No results found.  Review of Systems  Constitutional: Negative.   HENT: Negative.   Eyes: Negative.   Respiratory: Negative.   Cardiovascular: Negative.   Gastrointestinal: Negative.   Genitourinary: Negative.   Musculoskeletal: Negative.   Skin: Negative.   Neurological: Negative.   Endo/Heme/Allergies: Negative.   Psychiatric/Behavioral: Positive for depression (Rates his depression 10/10,, will restart his anti depressants), suicidal ideas (States he is constantlly suicidal since age 52 despite  taking medeications) and hallucinations (Reports auditory hallucination -voice telling him to electocute himself). Negative for memory loss and substance abuse. The patient has insomnia (Report poor sleep last night because he did not receive his Seroquel.  We will restart.). The patient is not nervous/anxious.    Blood pressure 142/82, pulse 65, temperature 98.5 F (36.9 C), temperature source Oral, resp. rate 16, SpO2 100.00%. Physical Exam   Constitutional: He is oriented to person, place, and time. He appears well-developed and well-nourished.  HENT:  Head: Normocephalic and atraumatic.  Eyes: Conjunctivae and EOM are normal. Pupils are equal, round, and reactive to light.  Neck: Normal range of motion. Neck supple.  Cardiovascular: Normal rate, regular rhythm, normal heart sounds and intact distal pulses.   Respiratory: Effort normal and breath sounds normal.  GI: Soft. Bowel sounds are normal.  Musculoskeletal: Normal range of motion.  Neurological: He is alert and oriented to person, place, and time. He has normal reflexes.  Skin: Skin is warm and dry.    Assessment/Plan:  Admit for crisis management/stabilization. Review and reinstate any pertinent home medications for other health issues.   Medication management to treat current mood problems.  Plan to admit here in our inpatient unit or refer to other facilities.  Dahlia Byes, C  PMHNP-BC 01/12/2013, 2:40 PM

## 2013-01-12 NOTE — BHH Counselor (Signed)
Received a call from [place] stating that Tresa Endo  would like patient to have a telepsych. Sts that patient was recently hospitalized at [facility]. Patient then showed up at [facility]. Following [facility] Mixer he then showed up at [facility] Emergency Department. Writer informed Maryjo Rochester that Julieanne Cotton was here evaluating patients including Shule[place]. Per Maryjo Rochester this was the request of Tresa Endo. Writer contacted Dr. Rennis Chris to request an order for telepsych. Patient will remain in the ED awaiting a telepsych consult and evaluation by [facility] to determine further dis positioning.

## 2013-01-12 NOTE — Progress Notes (Signed)
Admission note: Pt reports passive SI. Pt contracts for safety at this time. Per pt, he's been experiencing on and off AVH. He reports that the voices are telling him to kill himself. He's been feeling hopeless  for about a week and began feeling SI. He went to [place] seeking help. He was discharged from there the same day unexpectedly. Pt then requested to go to the ED for help. Pt stressor are: being unemployed, dropped out of school and was recently kicked out of his mother house d/t going home high off marijuana. Pt denies any other substance abuse at this time.  *Ankle bracelet to his left ankle. Pt ankle bracelet charger is at the nurses station.   Pt assessed, searched, and explained the policy here at [facility]. Pt receptive to treatment.

## 2013-01-12 NOTE — Progress Notes (Signed)
Patient ID: Ryan Bartlett, male   DOB: Mar 22, 1996, 17 y.o.   MRN: [id number] D: pt. Visible on unit, in hall and dayroom. Pt. Reports he's here because "I was gonna electrocute myself" Pt. Contracts for safety. Pt. Reports "I been depressed since I was 17 years old", he notes family problems. "I need to go to a group home or somewhere with structure, til I can get on my feet". Pt. Says mom will let him return home if he completes rehab. "mom kicked me out for smoking weed" "I was out selling weed and stuff but I never encouraged my sister". Pt. Has on ankle bracelet, charged tonight. A: Writer introduced self to client and reviewed med administration times. Writer encouraged group. Staff will monitor q58min for safety. R: Pt. Is safe on the unit. Pt. Attended group.
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
## 2013-01-12 NOTE — ED Provider Notes (Signed)
Patient alert awake states he's currently suicidal and would kill himself by electric eating himself. [facility] Results for orders placed during the hospital encounter of 01/11/13  URINALYSIS, ROUTINE W REFLEX MICROSCOPIC      Result Value Range   Color, Urine YELLOW  YELLOW   APPearance CLEAR  CLEAR   Specific Gravity, Urine 1.030  1.005 - 1.030   pH 7.5  5.0 - 8.0   Glucose, UA NEGATIVE  NEGATIVE mg/dL   Hgb urine dipstick NEGATIVE  NEGATIVE   Bilirubin Urine NEGATIVE  NEGATIVE   Ketones, ur NEGATIVE  NEGATIVE mg/dL   Protein, ur NEGATIVE  NEGATIVE mg/dL   Urobilinogen, UA 2.0 (*) 0.0 - 1.0 mg/dL   Nitrite NEGATIVE  NEGATIVE   Leukocytes, UA NEGATIVE  NEGATIVE  URINE RAPID DRUG SCREEN (HOSP PERFORMED)      Result Value Range   Opiates NONE DETECTED  NONE DETECTED   Cocaine NONE DETECTED  NONE DETECTED   Benzodiazepines NONE DETECTED  NONE DETECTED   Amphetamines NONE DETECTED  NONE DETECTED   Tetrahydrocannabinol NONE DETECTED  NONE DETECTED   Barbiturates NONE DETECTED  NONE DETECTED  BASIC METABOLIC PANEL      Result Value Range   Sodium 140  135 - 145 mEq/L   Potassium 4.1  3.5 - 5.1 mEq/L   Chloride 103  96 - 112 mEq/L   CO2 28  19 - 32 mEq/L   Glucose, Bld 104 (*) 70 - 99 mg/dL   BUN 12  6 - 23 mg/dL   Creatinine, Ser 4.09  0.50 - 1.35 mg/dL   Calcium 9.4  8.4 - 81.1 mg/dL   GFR calc non Af Amer >90  >90 mL/min   GFR calc Af Amer >90  >90 mL/min  CBC WITH DIFFERENTIAL      Result Value Range   WBC 7.5  4.0 - 10.5 K/uL   RBC 4.86  4.22 - 5.81 MIL/uL   Hemoglobin 13.1  13.0 - 17.0 g/dL   HCT 91.4  78.2 - 95.6 %   MCV 82.1  78.0 - 100.0 fL   MCH 27.0  26.0 - 34.0 pg   MCHC 32.8  30.0 - 36.0 g/dL   RDW 21.3  08.6 - 57.8 %   Platelets 210  150 - 400 K/uL   Neutrophils Relative % 65  43 - 77 %   Neutro Abs 4.9  1.7 - 7.7 K/uL   Lymphocytes Relative 25  12 - 46 %   Lymphs Abs 1.9  0.7 - 4.0 K/uL   Monocytes  Relative 11  3 - 12 %   Monocytes Absolute 0.8  0.1 - 1.0 K/uL   Eosinophils Relative 0  0 - 5 %   Eosinophils Absolute 0.0  0.0 - 0.7 K/uL   Basophils Relative 0  0 - 1 %   Basophils Absolute 0.0  0.0 - 0.1 K/uL   No results found.   Doug Sou, MD 01/12/13 [phone]

## 2013-01-13 DIAGNOSIS — F919 Conduct disorder, unspecified: Secondary | ICD-10-CM

## 2013-01-13 DIAGNOSIS — F913 Oppositional defiant disorder: Secondary | ICD-10-CM

## 2013-01-13 DIAGNOSIS — F319 Bipolar disorder, unspecified: Secondary | ICD-10-CM

## 2013-01-13 DIAGNOSIS — F333 Major depressive disorder, recurrent, severe with psychotic symptoms: Secondary | ICD-10-CM

## 2013-01-13 DIAGNOSIS — F3162 Bipolar disorder, current episode mixed, moderate: Principal | ICD-10-CM

## 2013-01-13 DIAGNOSIS — F1210 Cannabis abuse, uncomplicated: Secondary | ICD-10-CM

## 2013-01-13 DIAGNOSIS — F1010 Alcohol abuse, uncomplicated: Secondary | ICD-10-CM

## 2013-01-13 DIAGNOSIS — F909 Attention-deficit hyperactivity disorder, unspecified type: Secondary | ICD-10-CM

## 2013-01-13 MED ORDER — FLUOXETINE HCL 10 MG PO CAPS
10.0000 mg | ORAL_CAPSULE | Freq: Every day | ORAL | Status: DC
  Administered 2013-01-14 – 2013-01-15 (×2): 10 mg via ORAL
  Filled 2013-01-13 (×3): qty 1

## 2013-01-13 MED ORDER — CARBAMAZEPINE 200 MG PO TABS
200.0000 mg | ORAL_TABLET | Freq: Two times a day (BID) | ORAL | Status: DC
  Administered 2013-01-13 – 2013-01-19 (×12): 200 mg via ORAL
  Filled 2013-01-13 (×14): qty 1

## 2013-01-13 MED ORDER — QUETIAPINE FUMARATE ER 300 MG PO TB24
300.0000 mg | ORAL_TABLET | Freq: Every day | ORAL | Status: DC
  Administered 2013-01-13 – 2013-01-18 (×6): 300 mg via ORAL
  Filled 2013-01-13 (×7): qty 1

## 2013-01-13 NOTE — Tx Team (Signed)
  Interdisciplinary Treatment Plan Update   Date Reviewed:  01/13/2013  Time Reviewed:  8:12 AM  Progress in Treatment:   Attending groups: No Participating in groups: No Taking medication as prescribed: Yes  Tolerating medication: Yes Family/Significant other contact made: No Patient understands diagnosis: Yes  As evidenced by asking for help with SI, psychosis Discussing patient identified problems/goals with staff: Yes  See initial plan Medical problems stabilized or resolved: Yes Denies suicidal/homicidal ideation: No  But contracts for safety Patient has not harmed self or others: Yes  For review of initial/current patient goals, please see plan of care.  Estimated Length of Stay:  4-5 days  Reason for Continuation of Hospitalization: Depression Hallucinations Medication stabilization Suicidal ideation  New Problems/Goals identified:  N/A  Discharge Plan or Barriers:   unknown  Additional Comments:  Upon admission to the unit Ryan Bartlett was noted to be wearing an ankle bracelet which he stated was for his upcoming court date on 6/24 for pending charges of larceny, conspiracy and damage to property. He was alert and oriented x 3, reporting SI with plans but stated he had no means to do so on the unit, and did contract for safety with this provider. He also endorsed auditory and command hallucinations stating that he hurt himself and others, but no visual hallucinations.  Benoit reported that he did drink alcohol every day, with his last drink being the Friday before he was admitted he could not state how much he drank, and he denied any withdrawal symptoms. He stated he smoked THC every other day but could not quantify how much.   Attendees:  Signature: Thedore Mins, MD 01/13/2013 8:12 AM   Signature: Richelle Ito, [name] 01/13/2013 8:12 AM  Signature: Verne Spurr, PA 01/13/2013 8:12 AM  Signature: Neill Loft, RN 01/13/2013 8:12 AM  Signature: Liborio Nixon, RN 01/13/2013 8:12 AM   Signature:  01/13/2013 8:12 AM  Signature:   01/13/2013 8:12 AM  Signature:    Signature:    Signature:    Signature:    Signature:    Signature:      Scribe for Treatment Team:   Richelle Ito, [name]  01/13/2013 8:12 AM

## 2013-01-13 NOTE — H&P (Signed)
Psychiatric Admission Assessment Adult  Patient Identification:  Ryan Bartlett Date of Evaluation:  01/13/2013 Chief Complaint:  BIPOLAR DISORDER History of Present Illness: Ryan Bartlett presented to the [facility], the same day after being discharged from [facility] that morning reporting SI with a plan to hang himself or to electrocute himself. He has a long history of mental illness including Bipolar disorder, ADHD, ODD, Antisocial personality disorder, and substance abuse.  He initially stated that he had no where to go and he would hang himself if he was discharged. He was evaluated by tele-psych and admission to in patient services was advised. He was given medical clearance and transferred to [facility] for further stabilization and care. His labs were unremarkable with the exception of his liver enzymes which were mildly elevated.  The remainder were normal or negative including the UDS.      Upon admission to the unit Ryan Bartlett was noted to be wearing an ankle bracelet which he stated was for his upcoming court date on 6/24 for pending charges of larceny, conspiracy and damage to property.  He was alert and oriented x 3, reporting SI with plans but stated he had no means to do so on the unit, and did contract for safety with this provider. He also endorsed auditory and command hallucinations stating that he hurt himself and others, but no visual hallucinations.      Ryan Bartlett reported that he did drink alcohol every day, with his last drink being the Friday before he was admitted he could not state how much he drank, and he denied any withdrawal symptoms. He stated he smoked THC every other day but could not quantify how much. Elements:  Location:  adult in patient admission. Quality:  moderate chronic. Severity:  moderate. Timing:  worsening over the last 6 months. Duration:  since age 1. Context:  poor family relationships, legal problems, housing, access to medical care. Associated Signs/Synptoms: Depression  Symptoms:  depressed mood, difficulty concentrating, hopelessness, suicidal thoughts with specific plan, anxiety, (Hypo) Manic Symptoms:  Impulsivity, Anxiety Symptoms:  Excessive Worry, Psychotic Symptoms:  Hallucinations: Auditory Command:  to hurt himself and others PTSD Symptoms: NA  Psychiatric Specialty Exam: Physical Exam  Constitutional: He appears well-developed and well-nourished.  Patient is seen and chart is reviewed, no further exam is needed at this time. I agree with the assessment completed by the provider in the ED.  Psychiatric: His speech is normal. His mood appears anxious. His affect is blunt. He is withdrawn. Thought content is paranoid. Thought content is not delusional. Cognition and memory are impaired. He expresses impulsivity and inappropriate judgment. He exhibits a depressed mood. He expresses suicidal ideation. He expresses no homicidal ideation. He expresses suicidal plans. He expresses no homicidal plans. He exhibits normal recent memory and normal remote memory.    Review of Systems  Constitutional: Negative.  Negative for fever, chills, weight loss, malaise/fatigue and diaphoresis.  HENT: Negative for congestion and sore throat.   Eyes: Negative for blurred vision, double vision and photophobia.  Respiratory: Negative for cough, shortness of breath and wheezing.   Cardiovascular: Negative for chest pain, palpitations and PND.  Gastrointestinal: Negative for heartburn, nausea, vomiting, abdominal pain, diarrhea and constipation.  Musculoskeletal: Negative for myalgias, joint pain and falls.  Neurological: Negative for dizziness, tingling, tremors, sensory change, speech change, focal weakness, seizures, loss of consciousness, weakness and headaches.  Endo/Heme/Allergies: Negative for polydipsia. Does not bruise/bleed easily.  Psychiatric/Behavioral: Positive for depression, suicidal ideas, hallucinations and substance abuse. Negative for  memory loss. The  patient is nervous/anxious and has insomnia.     Blood pressure 108/73, pulse 94, temperature 97.7 F (36.5 C), temperature source Oral, resp. rate 20, height 5\' 8"  (1.727 m), weight 85.276 kg (188 lb).Body mass index is 28.59 kg/(m^2).  General Appearance: Disheveled  Eye Contact::  Minimal  Speech:  Clear and Coherent  Volume:  Decreased  Mood:  Depressed  Affect:  Flat  Thought Process:  Goal Directed  Orientation:  Full (Time, Place, and Person)  Thought Content:  Hallucinations: Auditory Command:  to harm self and others  Suicidal Thoughts:  Yes, but can contract for safety  Homicidal Thoughts:  No  Memory:  Immediate;   Poor  Judgement:  Impaired  Insight:  Lacking  Psychomotor Activity:  Normal  Concentration:  Poor  Recall:  Poor  Akathisia:  No  Handed:  Right  AIMS (if indicated):     Assets:  Physical Health  Sleep:  Number of Hours: 6.25    Past Psychiatric History: Diagnosis:  Hospitalizations:  Outpatient Care:  Substance Abuse Care:  Self-Mutilation:  Suicidal Attempts:  Violent Behaviors:   Past Medical History:   Past Medical History  Diagnosis Date  . ADHD (attention deficit hyperactivity disorder)   . Unspecified episodic mood disorder   . Oppositional defiant disorder   . Bipolar disorder    None. Allergies:   Allergies  Allergen Reactions  . Peanut-Containing Drug Products Anaphylaxis  . Lactose Intolerance (Gi) Diarrhea and Nausea And Vomiting   PTA Medications: Prescriptions prior to admission  Medication Sig Dispense Refill  . carbamazepine (TEGRETOL) 200 MG tablet Take 200 mg by mouth 3 (three) times daily.      . cyclobenzaprine (FLEXERIL) 5 MG tablet Take 5 mg by mouth 3 (three) times daily as needed for muscle spasms. For spasms      . ibuprofen (ADVIL,MOTRIN) 200 MG tablet Take 800 mg by mouth every 6 (six) hours as needed for pain.       Marland Kitchen PARoxetine (PAXIL) 20 MG tablet Take 20 mg by mouth every morning.      Marland Kitchen QUEtiapine  (SEROQUEL) 100 MG tablet Take 200 mg by mouth at bedtime.        Previous Psychotropic Medications:  Medication/Dose                 Substance Abuse History in the last 12 months:  yes  Consequences of Substance Abuse: Medical Consequences:  elevated liver enzymes Family Consequences:  mother put him out of the home for being high  Social History:  reports that he has been smoking Cigarettes.  He has a 5 pack-year smoking history. He has never used smokeless tobacco. He reports that he drinks about 3.6 ounces of alcohol per week. He reports that he uses illicit drugs (Marijuana and Cocaine). Additional Social History: History of alcohol / drug use?: Yes Withdrawal Symptoms:  (none) Name of Substance 1: THC 1 - Age of First Use: 12 1 - Amount (size/oz): 4 blunts 1 - Frequency: daily 1 - Duration: few days ago                  Current Place of Residence:   Place of Birth:   Family Members: Marital Status:  Single Children:  Sons:  Daughters: Relationships: Education:  11th grade, mixed classes, special ed and regular classes per patient Educational Problems/Performance: Religious Beliefs/Practices: History of Abuse (Emotional/Phsycial/Sexual) Occupational Experiences; Military History:  None. Legal History:  15 charges currently  pending, for larceny, conspiracy, damage to property. Patient is wearing an ankle bracelet and states he has a court date on 02/10/2013 Hobbies/Interests:  Family History:   Family History  Problem Relation Age of Onset  . Depression Mother     Results for orders placed during the hospital encounter of 01/11/13 (from the past 72 hour(s))  URINALYSIS, ROUTINE W REFLEX MICROSCOPIC     Status: Abnormal   Collection Time    01/11/13  9:21 PM      Result Value Range   Color, Urine YELLOW  YELLOW   APPearance CLEAR  CLEAR   Specific Gravity, Urine 1.030  1.005 - 1.030   pH 7.5  5.0 - 8.0   Glucose, UA NEGATIVE  NEGATIVE mg/dL    Hgb urine dipstick NEGATIVE  NEGATIVE   Bilirubin Urine NEGATIVE  NEGATIVE   Ketones, ur NEGATIVE  NEGATIVE mg/dL   Protein, ur NEGATIVE  NEGATIVE mg/dL   Urobilinogen, UA 2.0 (*) 0.0 - 1.0 mg/dL   Nitrite NEGATIVE  NEGATIVE   Leukocytes, UA NEGATIVE  NEGATIVE   Comment: MICROSCOPIC NOT DONE ON URINES WITH NEGATIVE PROTEIN, BLOOD, LEUKOCYTES, NITRITE, OR GLUCOSE <1000 mg/dL.  URINE RAPID DRUG SCREEN (HOSP PERFORMED)     Status: None   Collection Time    01/11/13  9:21 PM      Result Value Range   Opiates NONE DETECTED  NONE DETECTED   Cocaine NONE DETECTED  NONE DETECTED   Benzodiazepines NONE DETECTED  NONE DETECTED   Amphetamines NONE DETECTED  NONE DETECTED   Tetrahydrocannabinol NONE DETECTED  NONE DETECTED   Barbiturates NONE DETECTED  NONE DETECTED   Comment:            DRUG SCREEN FOR MEDICAL PURPOSES     ONLY.  IF CONFIRMATION IS NEEDED     FOR ANY PURPOSE, NOTIFY LAB     WITHIN 5 DAYS.                LOWEST DETECTABLE LIMITS     FOR URINE DRUG SCREEN     Drug Class       Cutoff (ng/mL)     Amphetamine      1000     Barbiturate      200     Benzodiazepine   200     Tricyclics       300     Opiates          300     Cocaine          300     THC              50  BASIC METABOLIC PANEL     Status: Abnormal   Collection Time    01/11/13 10:18 PM      Result Value Range   Sodium 140  135 - 145 mEq/L   Potassium 4.1  3.5 - 5.1 mEq/L   Chloride 103  96 - 112 mEq/L   CO2 28  19 - 32 mEq/L   Glucose, Bld 104 (*) 70 - 99 mg/dL   BUN 12  6 - 23 mg/dL   Creatinine, Ser 1.61  0.50 - 1.35 mg/dL   Calcium 9.4  8.4 - 09.6 mg/dL   GFR calc non Af Amer >90  >90 mL/min   GFR calc Af Amer >90  >90 mL/min   Comment:            The eGFR has been calculated  using the CKD EPI equation.     This calculation has not been     validated in all clinical     situations.     eGFR's persistently     <90 mL/min signify     possible Chronic Kidney Disease.  CBC WITH DIFFERENTIAL      Status: None   Collection Time    01/11/13 10:18 PM      Result Value Range   WBC 7.5  4.0 - 10.5 K/uL   RBC 4.86  4.22 - 5.81 MIL/uL   Hemoglobin 13.1  13.0 - 17.0 g/dL   HCT 16.1  09.6 - 04.5 %   MCV 82.1  78.0 - 100.0 fL   MCH 27.0  26.0 - 34.0 pg   MCHC 32.8  30.0 - 36.0 g/dL   RDW 40.9  81.1 - 91.4 %   Platelets 210  150 - 400 K/uL   Neutrophils Relative % 65  43 - 77 %   Neutro Abs 4.9  1.7 - 7.7 K/uL   Lymphocytes Relative 25  12 - 46 %   Lymphs Abs 1.9  0.7 - 4.0 K/uL   Monocytes Relative 11  3 - 12 %   Monocytes Absolute 0.8  0.1 - 1.0 K/uL   Eosinophils Relative 0  0 - 5 %   Eosinophils Absolute 0.0  0.0 - 0.7 K/uL   Basophils Relative 0  0 - 1 %   Basophils Absolute 0.0  0.0 - 0.1 K/uL   Psychological Evaluations:  Assessment:   AXIS I:  Bipolar disorder, ADHD, ODD, Conduct disorder, MDD severe recurrent with psychotic features, cannabis abuse, alcohol abuse, AXIS II:  anti-social personality disorder AXIS III:   Past Medical History  Diagnosis Date  . ADHD (attention deficit hyperactivity disorder)   . Unspecified episodic mood disorder   . Oppositional defiant disorder   . Bipolar disorder    AXIS IV:  educational problems, housing problems, occupational problems, problems related to legal system/crime, problems related to social environment, problems with access to health care services and problems with primary support group AXIS V:  41-50 serious symptoms  Treatment Plan/Recommendations:  1. Admit for crisis management and stabilization. 2. Medication management to reduce current symptoms to base line and improve the patient's overall level of functioning. 3. Treat health problems as indicated. 4. Develop treatment plan to decrease risk of relapse upon discharge and to reduce the need for readmission. 5. Psycho-social education regarding relapse prevention and self care. 6. Health care follow up as needed for medical problems. 7. Restart home medications  where appropriate. 8. ELOS: 5-7 days Treatment Plan Summary: Daily contact with patient to assess and evaluate symptoms and progress in treatment Medication management Current Medications:  Current Facility-Administered Medications  Medication Dose Route Frequency Provider Last Rate Last Dose  . acetaminophen (TYLENOL) tablet 650 mg  650 mg Oral Q6H PRN Earney Navy, NP      . alum & mag hydroxide-simeth (MAALOX/MYLANTA) 200-200-20 MG/5ML suspension 30 mL  30 mL Oral Q4H PRN Earney Navy, NP   30 mL at 01/12/13 1853  . carbamazepine (TEGRETOL) tablet 200 mg  200 mg Oral TID Earney Navy, NP   200 mg at 01/13/13 0808  . magnesium hydroxide (MILK OF MAGNESIA) suspension 30 mL  30 mL Oral Daily PRN Earney Navy, NP      . PARoxetine (PAXIL) tablet 30 mg  30 mg Oral Daily Earney Navy, NP  30 mg at 01/13/13 0808  . QUEtiapine (SEROQUEL XR) 24 hr tablet 200 mg  200 mg Oral QHS Earney Navy, NP   200 mg at 01/12/13 2154    Observation Level/Precautions:  routine  Laboratory:  reviewed  Psychotherapy:  Individual and group  Medications:  Seroquel, Paxil, Tegratol, Flexeril  Consultations:  If needed.  Discharge Concerns:  Patient is requesting placement in a group home.  Estimated LOS: 5-7 days  Other:     I certify that inpatient services furnished can reasonably be expected to improve the patient's condition.   Rona Ravens. [place] RPAC 9:45 AM 01/13/2013   Seen and agreed. Thedore Mins, MD

## 2013-01-13 NOTE — BHH Suicide Risk Assessment (Signed)
Suicide Risk Assessment  Admission Assessment     Nursing information obtained from:  Patient Demographic factors:  Male;Adolescent or young adult;Low socioeconomic status;Unemployed Current Mental Status:  Suicidal ideation indicated by patient;Self-harm behaviors Loss Factors:  Decrease in vocational status;Financial problems / change in socioeconomic status Historical Factors:  Prior suicide attempts;Impulsivity Risk Reduction Factors:  Sense of responsibility to family  CLINICAL FACTORS:   Severe Anxiety and/or Agitation Bipolar Disorder:   Mixed State Alcohol/Substance Abuse/Dependencies Previous Psychiatric Diagnoses and Treatments  COGNITIVE FEATURES THAT CONTRIBUTE TO RISK:  Closed-mindedness Polarized thinking    SUICIDE RISK:   Mild:  Suicidal ideation of limited frequency, intensity, duration, and specificity.  There are no identifiable plans, no associated intent, mild dysphoria and related symptoms, good self-control (both objective and subjective assessment), few other risk factors, and identifiable protective factors, including available and accessible social support.  PLAN OF CARE:1. Admit for crisis management and stabilization. 2. Medication management to reduce current symptoms to base line and improve the  patient's overall level of functioning 3. Treat health problems as indicated. 4. Develop treatment plan to decrease risk of relapse upon discharge and the need for  readmission. 5. Psycho-social education regarding relapse prevention and self care. 6. Health care follow up as needed for medical problems. 7. Restart home medications where appropriate.   I certify that inpatient services furnished can reasonably be expected to improve the patient's condition.  Ryan Tsutsui,MD 01/13/2013, 10:40 AM

## 2013-01-13 NOTE — Clinical Social Work Note (Signed)
  Type of Therapy: Process Group Therapy  Participation Level:  Did Not Attend      Summary of Progress/Problems: Today's group addressed the issue of overcoming obstacles.  Patients were asked to identify their biggest obstacle post d/c that stands in the way of their on-going success, and then problem solve as to how to manage this.       Ryan Bartlett 01/13/2013   4:08 PM

## 2013-01-13 NOTE — Progress Notes (Signed)
Patient ID: Ryan Bartlett, male   DOB: [birth year], 17 y.o.   MRN: [id number]  Pt currently asleep; no s/s of distress noted at this time. Respirations regular and unlabored.

## 2013-01-13 NOTE — B[facility] Group Notes (Signed)
[facility] [name] Aftercare Discharge Planning Group Note   01/13/2013 8:12 AM  Participation Quality:  Did not attend    [place]

## 2013-01-13 NOTE — Consult Note (Signed)
Reviewed the information documented and agree with the treatment plan.  Detric Scalisi,JANARDHAHA R. 01/13/2013 3:31 PM

## 2013-01-13 NOTE — Progress Notes (Signed)
Patient ID: Ryan Bartlett, male   DOB: 08/30/1995, 17 y.o.   MRN: [id number]  D: Patient with minimal interaction with staff/peers. Pt with dull, flat affect endorsing depression.  A: Q 15 minute safety checks for safety, encourage staff/peer interaction and group participation. Administer medications as ordered by MD. R: Pt compliant with medications, but is isolative to his room during the day.

## 2013-01-13 NOTE — Progress Notes (Addendum)
Patient ID: Ryan Bartlett, male   DOB: 03-24-1996, 17 y.o.   MRN: [id number] D-Patient reports he slept well and that his appetite is good.  He reports low energy and poor ability to pay attention.  He rates his depression at 5 and hopelessness at 6.  He reports cravings and admits that he is hearing voices and that he is having some thoughts of self harm.   He does contract for safety.  His eye contact is very brief.  A- supported patient.  R- Pt met with PA.  He is not attending groups.

## 2013-01-14 DIAGNOSIS — F3160 Bipolar disorder, current episode mixed, unspecified: Secondary | ICD-10-CM

## 2013-01-14 MED ORDER — NICOTINE 14 MG/24HR TD PT24
14.0000 mg | MEDICATED_PATCH | Freq: Every day | TRANSDERMAL | Status: DC
  Administered 2013-01-14 – 2013-01-19 (×5): 14 mg via TRANSDERMAL
  Filled 2013-01-14 (×9): qty 1

## 2013-01-14 MED ORDER — NAPROXEN 250 MG PO TABS
250.0000 mg | ORAL_TABLET | Freq: Two times a day (BID) | ORAL | Status: DC
  Administered 2013-01-14 – 2013-01-18 (×9): 250 mg via ORAL
  Filled 2013-01-14 (×12): qty 1

## 2013-01-14 NOTE — Progress Notes (Signed)
Recreation Therapy Notes  Date: 05.28.2014 Time: 9:30am Location: [address] Dayroom  Group Topic/Focus: Problem Solving  Participation Level:  Did not attend  [employer], LRT/CTRS  Ryan Bartlett L 01/14/2013 10:18 AM

## 2013-01-14 NOTE — Progress Notes (Signed)
The focus of this group is to help patients review their daily goal of treatment and discuss progress on daily workbooks. Pt attended the evening group session and responded to all discussion prompts from the Writer. Pt reported having an "okay" day, a highlight of which was having a good conversation with his family over the phone. Pt had to be redirected several times during group for holding side conversations while others were talking. Pt smiled and laughed throughout the session and stated no additional needs from Nursing Staff this evening when prompted.

## 2013-01-14 NOTE — Clinical Social Work Note (Signed)
[facility] [employer] Group Therapy  01/14/2013 , 1:20 PM    Type of Therapy:  [employer] Presentation  Participation Level:  Active  Participation Quality:  Attentive  Affect:  Blunted  Cognitive:  Oriented  Insight:  Limited  Engagement in Therapy:  Engaged  Modes of Intervention:  Discussion, Education and Socialization  Summary of Progress/Problems:  Onalee Hua from [employer] came to present his recovery story and play the guitar.  Ryan Bartlett related to the presenters use of marijuana, and talked about how he believes that contributed to him coming into the hospital.  He came and went from group about 3 different times before finally leaving for good.  Ryan Bartlett 01/14/2013 , 1:20 PM

## 2013-01-14 NOTE — B[facility] Group Notes (Signed)
[facility] [name] Aftercare Discharge Planning Group Note   01/14/2013 9:19 AM  Participation Quality:  Engaged  Mood/Affect:  Depressed  Depression Rating:  5  Anxiety Rating:  5  Thoughts of Suicide:  Yes Will you contract for safety?   NA  Current AVH:  No  Plan for Discharge/Comments:  Rahkim initially declined to come to group, saying his stomach hurt, but agreed to try it.  Sat through group.  Stated that he needs to get into someplace that is structured.  "Otherwise I will just be on the street."  Interested in [facility] rehab-will call together to find out status.    Transportation Means:  unclear  Supports: unclear   [place], Baldo Daub

## 2013-01-14 NOTE — BHH Counselor (Signed)
Adult Psychosocial Assessment Update Interdisciplinary Team  Previous [facility] admissions/discharges:  Admissions Discharges  Date: current Date:  Date:  05/13/12 Date:  Date:  03/29/12 Date:  Date: Date:  Date: Date:   Changes since the last Psychosocial Assessment (including adherence to outpatient mental health and/or substance abuse treatment, situational issues contributing to decompensation and/or relapse). Vito presented to the [facility], the same day after being discharged from [facility] that morning reporting SI with a plan to hang himself or to electrocute himself. He has a long history of mental illness including Bipolar disorder, ADHD, ODD, Antisocial personality disorder, and substance abuse. He initially stated that he had no where to go and he would hang himself if he was discharged.    Upon admission to the unit Cal[place] was noted to be wearing an ankle bracelet which he stated was for his upcoming court date on 6/24 for pending charges of larceny, conspiracy and damage to property. He was alert and oriented x 3, reporting SI with plans but stated he had no means to do so on the unit, and did contract for safety with this provider. He also endorsed auditory and command hallucinations stating that he hurt himself and others, but no visual hallucinations.  Abdulai reported that he did drink alcohol every day, with his last drink being the Friday before he was admitted he could not state how much he drank, and he denied any withdrawal symptoms. He stated he smoked THC every other day but could not quantify how much.              Discharge Plan 1. Will you be returning to the same living situation after discharge?   Yes: No:      If no, what is your plan?    States he wants to get into rehab       2. Would you like a referral for services when you are discharged? Yes:     If yes, for what services?  No:        See above       Summary and Recommendations  (to be completed by the evaluator) Fread is not able to return home as he came home high, which was a violation of the rules.  He was most recently staying in the shelter after an admission to [facility] for the same presenting symptoms as here.  He would like to get into rehab, but is vague about his amount of use of alcohol, and UDS was negative for THC, though he states he smokes 4-5 blunts a day.  He will likely be sent back to the shelter from here.  He can benefit from crises stabilization, medication management, therapeutic milieu and referral for services.                       Signature:  [name], 01/14/2013 11:03 AM

## 2013-01-14 NOTE — Progress Notes (Signed)
[facility] MD Progress Note  01/14/2013 10:30 AM Ryan Bartlett  MRN:  [id number] Subjective: "I have been hearing voices telling me to hurt myself". Objective: Patient reports ongoing auditory hallucinations, mood swings, suicidal thoughts and irritability. Patient says he is stressed out due to having no job, legal and financial problem. He is estranged from his family, says that his mother kicked him out of the house doing to getting "high" everyday. He lives in the shelter currently. Patient is requesting for placement in group home and substance rehab program.  Diagnosis:  Axis I:Bipolar 1 Disorder mixed episode                               Cannabis use disorder  ADL's:  Intact  Sleep: Fair  Appetite:  Fair  Suicidal Ideation: yes Plan:  denies Intent:  denies Means:  denies Homicidal Ideation: no Plan:  denies Intent:  denies Means:  denies AEB (as evidenced by):  Psychiatric Specialty Exam: Review of Systems  Constitutional: Negative.   HENT: Negative.   Eyes: Negative.   Respiratory: Negative.   Cardiovascular: Negative.   Gastrointestinal: Negative.   Genitourinary: Negative.   Musculoskeletal: Positive for joint pain.  Skin: Negative.   Neurological: Negative.   Endo/Heme/Allergies: Negative.   Psychiatric/Behavioral: Positive for depression, suicidal ideas and hallucinations. The patient is nervous/anxious and has insomnia.     Blood pressure 111/70, pulse 80, temperature 97.5 F (36.4 C), temperature source Oral, resp. rate 20, height 5\' 8"  (1.727 m), weight 85.276 kg (188 lb).Body mass index is 28.59 kg/(m^2).  General Appearance: Fairly Groomed  Patent attorney::  Fair  Speech:  Clear and Coherent  Volume:  Normal  Mood:  Anxious and Dysphoric  Affect:  Blunt  Thought Process:  Goal Directed  Orientation:  Full (Time, Place, and Person)  Thought Content:  Hallucinations: Auditory  Suicidal Thoughts:  Yes.  without intent/plan  Homicidal Thoughts:  No   Memory:  Immediate;   Fair Recent;   Fair Remote;   Fair  Judgement:  Poor  Insight:  Lacking  Psychomotor Activity:  Normal  Concentration:  Fair  Recall:  Fair  Akathisia:  No  Handed:  Right  AIMS (if indicated):     Assets:  Communication Skills Desire for Improvement  Sleep:  Number of Hours: 6.5   Current Medications: Current Facility-Administered Medications  Medication Dose Route Frequency Provider Last Rate Last Dose  . acetaminophen (TYLENOL) tablet 650 mg  650 mg Oral Q6H PRN Earney Navy, NP      . alum & mag hydroxide-simeth (MAALOX/MYLANTA) 200-200-20 MG/5ML suspension 30 mL  30 mL Oral Q4H PRN Earney Navy, NP   30 mL at 01/12/13 1853  . carbamazepine (TEGRETOL) tablet 200 mg  200 mg Oral BID Gwendalynn Eckstrom   200 mg at 01/14/13 0832  . FLUoxetine (PROZAC) capsule 10 mg  10 mg Oral Daily Lief Palmatier   10 mg at 01/14/13 0832  . magnesium hydroxide (MILK OF MAGNESIA) suspension 30 mL  30 mL Oral Daily PRN Earney Navy, NP      . naproxen (NAPROSYN) tablet 250 mg  250 mg Oral BID WC Ashawn Rinehart      . QUEtiapine (SEROQUEL XR) 24 hr tablet 300 mg  300 mg Oral QPC supper Chelby Salata   300 mg at 01/13/13 1712    Lab Results: No results found for this or any previous visit (from  the past 48 hour(s)).  Physical Findings: AIMS: Facial and Oral Movements Muscles of Facial Expression: None, normal Lips and Perioral Area: None, normal Jaw: None, normal Tongue: None, normal,Extremity Movements Upper (arms, wrists, hands, fingers): None, normal Lower (legs, knees, ankles, toes): None, normal, Trunk Movements Neck, shoulders, hips: None, normal, Overall Severity Severity of abnormal movements (highest score from questions above): None, normal Incapacitation due to abnormal movements: None, normal Patient's awareness of abnormal movements (rate only patient's report): No Awareness, Dental Status Current problems with teeth and/or dentures?:  No Does patient usually wear dentures?: No  CIWA:    COWS:     Treatment Plan Summary: Daily contact with patient to assess and evaluate symptoms and progress in treatment Medication management  Plan:1. Admit for crisis management and stabilization. 2. Medication management to reduce current symptoms to base line and improve the  patient's overall level of functioning 3. Treat health problems as indicated. 4. Develop treatment plan to decrease risk of relapse upon discharge and the need for  readmission. 5. Psycho-social education regarding relapse prevention and self care. 6. Health care follow up as needed for medical problems. 7. Will continue patient on current medication regimen. 8. ELOS 3-5 days   Medical Decision Making Problem Points:  Established problem, stable/improving (1), Review of last therapy session (1) and Review of psycho-social stressors (1) Data Points:  Order Aims Assessment (2) Review of medication regiment & side effects (2) Review of new medications or change in dosage (2)  I certify that inpatient services furnished can reasonably be expected to improve the patient's condition.   Lelania Bia,MD 01/14/2013, 10:30 AM

## 2013-01-14 NOTE — Progress Notes (Signed)
D: Patient denies SI/HI and A/V hallucinations verbally but his self inventory worksheet he reported he was having constant thoughts of SI and patient does contract; patient reports sleep is fair; reports appetite is good; reports energy level is normal ; reports ability to pay attention is poor; rates depression as 4/10; rates hopelessness 5/10; patient reports knee pain   A: Monitored q 15 minutes; patient encouraged to attend groups; patient educated about medications; patient given medications per physician orders; patient encouraged to express feelings and/or concerns  R: Patient is flat but at times aninmated; patient is cooperative; patient's interaction with staff and peers is minimal; patient was able to set goal to talk with staff 1:1 when having feelings of SI; patient is taking medications as prescribed and tolerating medications; patient is attending some groups but not engaging a lot in conversation

## 2013-01-15 MED ORDER — FLUOXETINE HCL 20 MG PO CAPS
20.0000 mg | ORAL_CAPSULE | Freq: Every day | ORAL | Status: DC
  Administered 2013-01-16 – 2013-01-19 (×4): 20 mg via ORAL
  Filled 2013-01-15 (×5): qty 1

## 2013-01-15 NOTE — Clinical Social Work Note (Signed)
[facility] Group Notes:  (Counselor/Nursing/MHT/Case Management/Adjunct)  07/03/2012 2:20 PM  Type of Therapy:  Group Therapy  Participation Level:  Minimal  Participation Quality:  Appropriate  Affect:  Depressed  Cognitive:  Oriented  Insight:  Limited  Engagement in Group:  Limited  Engagement in Therapy:  Limited  Modes of Intervention:  Discussion, Exploration and Socialization  Summary of Progress/Problems: .balance: The topic for group was balance in life.  Pt participated in the discussion about when their life was in balance and out of balance and how this feels.  Pt discussed ways to get back in balance and short term goals they can work on to get where they want to be.  Joas says he is unbalanced, and has been since the ago of 12 "when all this started."  Finally came up with a scenario in which he is balanced, which is when he is riding his skateboard, but is unable to access that in his mind.  Made it clear to me in group that he believes only being here three days is not enough to help him with his mental illness, especially since "I always have those thoughts."  Told him to express this to the Dr tomorrow AM.   [name] 07/03/2012, 2:20 PM

## 2013-01-15 NOTE — Progress Notes (Signed)
Patient ID: Ryan Bartlett, male   DOB: 11-24-1995, 17 y.o.   MRN: [id number] [facility] MD Progress Note  01/15/2013 10:08 AM Ryan Bartlett  MRN:  [id number] Subjective: "I am feeling tired and sleepy today". Objective: Patient reports decreased auditory hallucinations, mood swings and irritability. He reports feeling tired and a little depressed. He endorsed occasional suicidal thoughts with no significant plan. Patient has limited insight and showing poor judgement. Patient is compliant with his medication and has not reported any adverse reactions.  Diagnosis:  Axis I:Bipolar 1 Disorder mixed episode                               Cannabis use disorder  ADL's:  Intact  Sleep: Fair  Appetite:  Fair  Suicidal Ideation: no Plan:  denies Intent:  denies Means:  denies Homicidal Ideation: no Plan:  denies Intent:  denies Means:  denies AEB (as evidenced by):  Psychiatric Specialty Exam: Review of Systems  Constitutional: Negative.   HENT: Negative.   Eyes: Negative.   Respiratory: Negative.   Cardiovascular: Negative.   Gastrointestinal: Negative.   Genitourinary: Negative.   Musculoskeletal: Positive for joint pain.  Skin: Negative.   Neurological: Negative.   Endo/Heme/Allergies: Negative.   Psychiatric/Behavioral: Positive for depression and hallucinations. The patient is nervous/anxious.     Blood pressure 119/68, pulse 67, temperature 97.2 F (36.2 C), temperature source Oral, resp. rate 16, height 5\' 8"  (1.727 m), weight 85.276 kg (188 lb).Body mass index is 28.59 kg/(m^2).  General Appearance: Fairly Groomed  Patent attorney::  Fair  Speech:  Clear and Coherent  Volume:  Normal  Mood:  Anxious and Dysphoric  Affect:  Blunt  Thought Process:  Goal Directed  Orientation:  Full (Time, Place, and Person)  Thought Content:  Hallucinations: Auditory  Suicidal Thoughts:  Yes.  without intent/plan  Homicidal Thoughts:  No  Memory:  Immediate;   Fair Recent;   Fair Remote;    Fair  Judgement:  Poor  Insight:  Lacking  Psychomotor Activity:  Normal  Concentration:  Fair  Recall:  Fair  Akathisia:  No  Handed:  Right  AIMS (if indicated):     Assets:  Communication Skills Desire for Improvement  Sleep:  Number of Hours: 5.5   Current Medications: Current Facility-Administered Medications  Medication Dose Route Frequency Provider Last Rate Last Dose  . acetaminophen (TYLENOL) tablet 650 mg  650 mg Oral Q6H PRN Earney Navy, NP   650 mg at 01/14/13 1524  . alum & mag hydroxide-simeth (MAALOX/MYLANTA) 200-200-20 MG/5ML suspension 30 mL  30 mL Oral Q4H PRN Earney Navy, NP   30 mL at 01/12/13 1853  . carbamazepine (TEGRETOL) tablet 200 mg  200 mg Oral BID Elih Mooney   200 mg at 01/15/13 0820  . [START ON 01/16/2013] FLUoxetine (PROZAC) capsule 20 mg  20 mg Oral Daily Briseis Aguilera      . magnesium hydroxide (MILK OF MAGNESIA) suspension 30 mL  30 mL Oral Daily PRN Earney Navy, NP      . naproxen (NAPROSYN) tablet 250 mg  250 mg Oral BID WC Maeve Debord   250 mg at 01/15/13 0820  . nicotine (NICODERM CQ - dosed in mg/24 hours) patch 14 mg  14 mg Transdermal Daily Marlea Gambill   14 mg at 01/15/13 0821  . QUEtiapine (SEROQUEL XR) 24 hr tablet 300 mg  300 mg Oral QPC supper Sapphira Harjo  Damiean Lukes   300 mg at 01/14/13 1827    Lab Results: No results found for this or any previous visit (from the past 48 hour(s)).  Physical Findings: AIMS: Facial and Oral Movements Muscles of Facial Expression: None, normal Lips and Perioral Area: None, normal Jaw: None, normal Tongue: None, normal,Extremity Movements Upper (arms, wrists, hands, fingers): None, normal Lower (legs, knees, ankles, toes): None, normal, Trunk Movements Neck, shoulders, hips: None, normal, Overall Severity Severity of abnormal movements (highest score from questions above): None, normal Incapacitation due to abnormal movements: None, normal Patient's awareness of abnormal  movements (rate only patient's report): No Awareness, Dental Status Current problems with teeth and/or dentures?: No Does patient usually wear dentures?: No  CIWA:  CIWA-Ar Total: 0 COWS:  COWS Total Score: 0  Treatment Plan Summary: Daily contact with patient to assess and evaluate symptoms and progress in treatment Medication management  Plan:1. Admit for crisis management and stabilization. 2. Medication management to reduce current symptoms to base line and improve the  patient's overall level of functioning 3. Treat health problems as indicated. 4. Develop treatment plan to decrease risk of relapse upon discharge and the need for  readmission. 5. Psycho-social education regarding relapse prevention and self care. 6. Health care follow up as needed for medical problems. 7. Will increase Prozac to 20mg  po daily to address  Symptoms of depression. 8. ELOS 3-5 days   Medical Decision Making Problem Points:  Established problem, stable/improving (1), Review of last therapy session (1) and Review of psycho-social stressors (1) Data Points:  Order Aims Assessment (2) Review of medication regiment & side effects (2) Review of new medications or change in dosage (2)  I certify that inpatient services furnished can reasonably be expected to improve the patient's condition.   Kimberlye Dilger,MD 01/15/2013, 10:08 AM

## 2013-01-15 NOTE — [facility] Group Notes (Signed)
[facility] [name] Aftercare Discharge Planning Group Note   01/15/2013 9:24 AM  Participation Quality:  Minimal  Mood/Affect:  Depressed  Depression Rating:  7  Anxiety Rating:  7  Thoughts of Suicide:  Yes Will you contract for safety?   NA  Current AVH:  No  Plan for Discharge/Comments:  Let Rhyse know that Floydene Flock is not an option from here, nor is [facility].  We will send him to back to the shelter and make a referral to the ACT or CST team.  Eliberto Ivory indicated he understands, and said he preferred ACT.  Transportation Means: bus  Supports: none  [place], [place] B

## 2013-01-15 NOTE — Progress Notes (Signed)
Patient ID: Ryan Bartlett, male   DOB: Jan 13, 1996, 17 y.o.   MRN: [id number] D: Pt. Angry reports he was told that he would not be able to make it in [employer]. Pt. Reports" I don't vibe with the doctor, I don't like him, I rather see NP the rest of the time". Pt. Reports he worked at an [employer]/rest[employer] a few months and he likes it "I like animals" "I'm going to get this stuff expunged off my record and I'm going back to school" "do you know how many people have had one of these things on(pt point to ankle bracelet) and got themselves together?" Pt. Reports Dr. Lucianne Muss going to take him back as a client. "I been with Dr. Lucianne Muss since I was 17 years old, I wanted to hug her when she said she would take me back, she just like a second mother, I'm so happy she going to take me back" A: Writer encouraged pt. To move forward, and encouraged him to continue using coping skill and walk away when angry. Staff will monitor q60min for safety. R: Pt. Is safe on the unit. Pt. Attended group.

## 2013-01-15 NOTE — Progress Notes (Addendum)
D:  Patient's self inventory sheet, patient has fair sleep, good appetite, low energy level, poor attention span.  Rated depression #2, hopelessness #1.  Has experienced cravings.  SI constant, contracts for safety.  Has experienced pain.  Pain goal #4, worst pain #8.   A:  Medications administered per MD orders.  Emotional support and encouragement given to patient. R:  Denied HI.  Denied A/V hallucinations at this time.  SI constant, contracts for safety.  Has thoughts to hang himself but will talk to staff if  Needed. Ankle bracelet intact.  Patient stated he ate good breakfast this morning.  Feels tired, did not sleep well last night. Patient has been SI since age 17 years, not sure of triggers.  Has [facility] screening this morning.  May have to go back to shelter.  Prefers ACT team.  Dr. Lucianne Muss will take him back as pt.  Problem will be if patient will show up for appts.  Stated he does not plan to smoke/drink after discharge.  May be discharged on Friday.  Patient stated he does not want to be discharged tomorrow, feels he will hurt himself if he is discharged.  Thinking about going into shower and electrocuting himself with ankle bracelet.  Dad does not love him, and mom put him out of house.  Explained to patient that mother wants him to make right decisions, that she loves him, wants him to go back to school, stay out of trouble, have a productive and good life, many good years ahead of him.  Patient agrees that he needs to make right decisions about his future.  1800  Patient stated again that he is not ready for discharge.   "I don't feel any different."

## 2013-01-15 NOTE — Progress Notes (Addendum)
Patient ID: Ryan Bartlett, male   DOB: 03-14-1996, 17 y.o.   MRN: [id number]  D: Pt endorses SI/AH but contracts for safety. Pt states " voices tell me to do something to hurt myself"  Pt denies HI/VH. Pt is  Cooperative. Pt forwards little, is evasive and provides minimal contact with Clinical research associate.  A: Pt was offered support and encouragement. Pt was given scheduled medications. Pt was encourage to attend groups. Q 15 minute checks were done for safety.   R:Pt attends groups and interacts with peers and staff. Pt is taking medication. Pt has no complaints at this time.Pt receptive to treatment and safety maintained on unit.

## 2013-01-16 NOTE — Progress Notes (Signed)
D: Patient denies HI and visual hallucinations and admits to on and off thoughts of SI and admits to auditory hallucinations of voices telling him to hurt himself ; patient reports sleep is fair; reports appetite is good; reports energy level is low ; reports ability to pay attention is poor; rates depression as 9/10; rates hopelessness 10/10;   A: Monitored q 15 minutes; patient encouraged to attend groups; patient educated about medications; patient given medications per physician orders; patient encouraged to express feelings and/or concerns  R: Patient is flat and sad; patient is cooperative patient's interaction with staff and peers is appropriate but minimal; patient was able to set goal to talk with staff 1:1 when having feelings of SI ; patient is taking medications as prescribed and tolerating medications; patient is attending all groups and engaging some

## 2013-01-16 NOTE — Progress Notes (Signed)
Adult Psychoeducational Group Note  Date:  01/16/2013 Time:  8:00PM Group Topic/Focus:  Wrap-Up Group:   The focus of this group is to help patients review their daily goal of treatment and discuss progress on daily workbooks.  Participation Level:  Active  Participation Quality:  Appropriate and Attentive  Affect:  Appropriate  Cognitive:  Alert and Appropriate  Insight: Appropriate  Engagement in Group:  Engaged  Modes of Intervention:  Discussion  Additional Comments:  Pt. Was attentive and appropriate during tonight's group. Pt stated that he know the true meaning on brotherhood. Pt is working having a positive support group. Pt. Talked about limiting gang involment. Pt is also talked about decrease in smoking marijuana   April Manson 01/16/2013, 8:42 PM

## 2013-01-16 NOTE — Progress Notes (Signed)
Recreation Therapy Notes  Date: 05.30.2014 Time: 9:30am Location: [address] Day[address]      Group Topic/Focus: Communication  Participation Level: Did not attend  Danyiel Crespin L Starsky Nanna, LRT/CTRS  Logan Vegh L 01/16/2013 2:46 PM 

## 2013-01-16 NOTE — [facility] Group Notes (Signed)
[facility] [name] Aftercare Discharge Planning Group Note   01/16/2013 9:41 AM  Participation Quality:  Minimal  Mood/Affect:  Flat  Depression Rating:  7  Anxiety Rating:  3  Thoughts of Suicide:  Yes Will you contract for safety?   Yes  Current AVH:  No  Plan for Discharge/Comments:  Elex continues to complain of unceasing thoughts of self harm.  Let him know there is no medication that can cure that, and that people are able to progress on an out pt basis if they are willing to do the work it takes to deal with issues.  Will stay thru the weekend.  Transportation Means: bus  Supports:  family  [place], [place] Distance B

## 2013-01-16 NOTE — Tx Team (Signed)
  Interdisciplinary Treatment Plan Update   Date Reviewed:  01/16/2013  Time Reviewed:  8:08 AM  Progress in Treatment:   Attending groups: Yes Participating in groups: Yes Taking medication as prescribed: Yes  Tolerating medication: Yes Family/Significant other contact made: Yes  Patient understands diagnosis: Yes  Discussing patient identified problems/goals with staff: Yes Medical problems stabilized or resolved: Yes Denies suicidal/homicidal ideation: No  But contracts for safety Patient has not harmed self or others: Yes  For review of initial/current patient goals, please see plan of care.  Estimated Length of Stay:  likely d/c on Monday 6/2  Reason for Continuation of Hospitalization: Medication stabilization Suicidal ideation Increase coping skills  New Problems/Goals identified:  N/A  Discharge Plan or Barriers:   D/C to shelter  Additional Comments:  Pt is being interviewed by ACT team today; hopefully he will be able to access that service at d/c  Attendees:  Signature: Thedore Mins, MD 01/16/2013 8:08 AM   Signature: Richelle Ito, [name] 01/16/2013 8:08 AM  Signature: Verne Spurr, PA 01/16/2013 8:08 AM  Signature: Isaac Laud, RN 01/16/2013 8:08 AM  Signature:  01/16/2013 8:08 AM  Signature:  01/16/2013 8:08 AM  Signature:   01/16/2013 8:08 AM  Signature:    Signature:    Signature:    Signature:    Signature:    Signature:      Scribe for Treatment Team:   Richelle Ito, [name]  01/16/2013 8:08 AM

## 2013-01-17 NOTE — Progress Notes (Signed)
Patient ID: Ryan Bartlett, male   DOB: 04/30/1996, 17 y.o.   MRN: [id number] Psychoeducational Group Note  Date:  01/17/2013 Time:1000am  Group Topic/Focus:  Identifying Needs:   The focus of this group is to help patients identify their personal needs that have been historically problematic and identify healthy behaviors to address their needs.  Participation Level:  Did Not Attend  Participation Quality:    Affect:   Cognitive: Insight  Engagement in Group:  Additional Comments:  Inventory and Psychoeducational group   [name] 01/17/2013,10:25 AM

## 2013-01-17 NOTE — Progress Notes (Signed)
D:  Patient stayed in his room most of the morning.  States roommate kept him up much of the night.  Did not attend groups today.  Affect remains flat/sad.  He declined to fill out his self inventory today.   A:  Medications given as scheduled.  Moved patient to a different room today.  Encouraged him to be up and active in the milieu.  R:  Pleasant and cooperative.  Interacting well with staff and peers.  Declines groups today.

## 2013-01-17 NOTE — [facility] Group Notes (Signed)
[facility] Group Notes: (Clinical Social Work)   01/17/2013      Type of Therapy:  Group Therapy   Participation Level:  Did Not Attend    Ambrose Mantle, [name] 01/17/2013, 1:08 PM

## 2013-01-17 NOTE — Progress Notes (Signed)
Patient ID: Ryan Bartlett, male   DOB: 06/16/1996, 17 y.o.   MRN: [id number]  D: Pt denies SI/HI/AVH/pain at this time. Pt is pleasant and cooperative. Pt worried about up-coming court/trial. Pt states " I don't want to go to jail". Pt said he had a good day, he got news he will receive two visitors (friends).   A: Pt was offered support and encouragement. Pt was given scheduled medications. Pt was encourage to attend groups. Q 15 minute checks were done for safety.   R:Pt attends groups and interacts well with peers and staff. Pt is taking medication. Pt has no  complaints at this time.Pt receptive to treatment and safety maintained on unit.

## 2013-01-17 NOTE — Progress Notes (Signed)
Patient ID: SIRR KABEL, male   DOB: Sep 26, 1995, 17 y.o.   MRN: [id number] [facility] MD Progress Note  01/17/2013 10:28 AM LYNKIN SAINI  MRN:  [id number] Subjective: "I still has difficulty sleeping.  ". Objective: Patient admitted to endorse passive suicidal thinking and auditory hallucination.  He admitted not sleeping well in the night however he felt better with increase Prozac.  He denied any side effects including any tremors or shakes.  He is complying with his medication and did not have any adverse effects.  He is not a management problem in the unit.   Diagnosis:  Axis I:Bipolar 1 Disorder mixed episode                               Cannabis use disorder  ADL's:  Intact  Sleep: Fair  Appetite:  Fair  Suicidal Ideation: no Plan:  denies Intent:  denies Means:  denies Homicidal Ideation: no Plan:  denies Intent:  denies Means:  denies AEB (as evidenced by):  Psychiatric Specialty Exam: Review of Systems  Constitutional: Negative.   HENT: Negative.   Eyes: Negative.   Respiratory: Negative.   Cardiovascular: Negative.   Gastrointestinal: Negative.   Genitourinary: Negative.   Musculoskeletal: Positive for joint pain.  Skin: Negative.   Neurological: Negative.   Endo/Heme/Allergies: Negative.   Psychiatric/Behavioral: Positive for depression and hallucinations. The patient is nervous/anxious.     Blood pressure 121/79, pulse 72, temperature 96.9 F (36.1 C), temperature source Oral, resp. rate 16, height 5\' 8"  (1.727 m), weight 85.276 kg (188 lb).Body mass index is 28.59 kg/(m^2).  General Appearance: Fairly Groomed  Patent attorney::  Fair  Speech:  Clear and Coherent  Volume:  Normal  Mood:  Anxious and Dysphoric  Affect:  Blunt  Thought Process:  Goal Directed  Orientation:  Full (Time, Place, and Person)  Thought Content:  Hallucinations: Auditory  Suicidal Thoughts:  Yes.  without intent/plan  Homicidal Thoughts:  No  Memory:  Immediate;   Fair Recent;    Fair Remote;   Fair  Judgement:  Poor  Insight:  Lacking  Psychomotor Activity:  Normal  Concentration:  Fair  Recall:  Fair  Akathisia:  No  Handed:  Right  AIMS (if indicated):     Assets:  Communication Skills Desire for Improvement  Sleep:  Number of Hours: 5   Current Medications: Current Facility-Administered Medications  Medication Dose Route Frequency Provider Last Rate Last Dose  . acetaminophen (TYLENOL) tablet 650 mg  650 mg Oral Q6H PRN Earney Navy, NP   650 mg at 01/14/13 1524  . alum & mag hydroxide-simeth (MAALOX/MYLANTA) 200-200-20 MG/5ML suspension 30 mL  30 mL Oral Q4H PRN Earney Navy, NP   30 mL at 01/12/13 1853  . carbamazepine (TEGRETOL) tablet 200 mg  200 mg Oral BID Mojeed Akintayo   200 mg at 01/17/13 0807  . FLUoxetine (PROZAC) capsule 20 mg  20 mg Oral Daily Mojeed Akintayo   20 mg at 01/17/13 0807  . magnesium hydroxide (MILK OF MAGNESIA) suspension 30 mL  30 mL Oral Daily PRN Earney Navy, NP      . naproxen (NAPROSYN) tablet 250 mg  250 mg Oral BID WC Mojeed Akintayo   250 mg at 01/17/13 0807  . nicotine (NICODERM CQ - dosed in mg/24 hours) patch 14 mg  14 mg Transdermal Daily Mojeed Akintayo   14 mg at 01/17/13 0807  .  QUEtiapine (SEROQUEL XR) 24 hr tablet 300 mg  300 mg Oral QPC supper Mojeed Akintayo   300 mg at 01/16/13 1836    Lab Results: No results found for this or any previous visit (from the past 48 hour(s)).  Physical Findings: AIMS: Facial and Oral Movements Muscles of Facial Expression: None, normal Lips and Perioral Area: None, normal Jaw: None, normal Tongue: None, normal,Extremity Movements Upper (arms, wrists, hands, fingers): None, normal Lower (legs, knees, ankles, toes): None, normal, Trunk Movements Neck, shoulders, hips: None, normal, Overall Severity Severity of abnormal movements (highest score from questions above): None, normal Incapacitation due to abnormal movements: None, normal Patient's awareness  of abnormal movements (rate only patient's report): No Awareness, Dental Status Current problems with teeth and/or dentures?: No Does patient usually wear dentures?: No  CIWA:  CIWA-Ar Total: 0 COWS:  COWS Total Score: 0  Treatment Plan Summary: Daily contact with patient to assess and evaluate symptoms and progress in treatment Medication management  Plan:1. Admit for crisis management and stabilization. 2. Medication management to reduce current symptoms to base line and improve the  patient's overall level of functioning 3. Treat health problems as indicated. 4. Develop treatment plan to decrease risk of relapse upon discharge and the need for  readmission. 5. Psycho-social education regarding relapse prevention and self care. 6. Health care follow up as needed for medical problems. 7. continue Prozac Seroquel and Tegretol to address Symptoms of depression.  We will get Tegretol level on Monday. 8. ELOS 3-5 days   Medical Decision Making Problem Points:  Established problem, stable/improving (1), Review of last therapy session (1) and Review of psycho-social stressors (1) Data Points:  Order Aims Assessment (2) Review of medication regiment & side effects (2) Review of new medications or change in dosage (2)  I certify that inpatient services furnished can reasonably be expected to improve the patient's condition.   Sevyn Paredez T.,MD 01/17/2013, 10:28 AM

## 2013-01-18 NOTE — [facility] Group Notes (Signed)
[facility] Group Notes:  (Clinical Social Work)  01/18/2013   11:15-11:45AM  Summary of Progress/Problems:  The main focus of today's process group was to listen to a variety of genres of music and to identify that different types of music provoke different responses.  The patient then was able to identify personally what was soothing for them, as well as energizing.  Handouts were used to record feelings evoked, as well as how patient can personally use this knowledge in sleep habits, with depression, and with other symptoms.  The patient expressed good understanding and ability to use the information.  Type of Therapy:  Music Therapy   Participation Level:  Active  Participation Quality:  Attentive and Sharing  Affect:  Appropriate  Cognitive:  Appropriate  Insight:  Engaged  Engagement in Therapy:  Engaged  Modes of Intervention:   Activity, Exploration  Ambrose Mantle, [name] 01/18/2013, 1:10 PM

## 2013-01-18 NOTE — Progress Notes (Signed)
Pt reports his sleep as fair.  His appetite is good energy low and ability to pay attention as poor.  He rated his his depression a 1 hopelessness 0 and denied any anxiety on his self-inventory.  He denies any S/H ideation.  He rated both his depression and hopelessness a 5 and his anxiety a 7.  Pt has an ACT team that follows him at home.

## 2013-01-18 NOTE — Progress Notes (Signed)
Patient ID: BURNICE OESTREICHER, male   DOB: 1996-02-26, 17 y.o.   MRN: [id number] Psychoeducational Group Note  Date:  01/18/2013 Time:  1000am  Group Topic/Focus:  Making Healthy Choices:   The focus of this group is to help patients identify negative/unhealthy choices they were using prior to admission and identify positive/healthier coping strategies to replace them upon discharge.  Participation Level:  Did Not Attend  Participation Quality:    Affect: Cognitive:  Insight:  Engagement in Group:  Additional Comments:  Inventory and Psychoeducational group   [name] 01/18/2013,10:16 AM

## 2013-01-18 NOTE — Progress Notes (Signed)
Patient ID: Ryan Bartlett, male   DOB: 1996/01/19, 17 y.o.   MRN: [id number] [facility] MD Progress Note  01/18/2013 10:36 AM Ryan Bartlett  MRN:  [id number] Subjective: "I still hear voices". Objective: Patient seen chart reviewed.  He is doing better on his medication however he continued to endorse auditory hallucination or passive suicidal thinking.  He believes medicine it is helping him .  Intensity is less frequent .  He requires encouragement to participate in groups.  However he has not a management problem.  Denies any tremors or shakes.    Diagnosis:  Axis I:Bipolar 1 Disorder mixed episode                               Cannabis use disorder  ADL's:  Intact  Sleep: Fair  Appetite:  Fair  Suicidal Ideation: no Plan:  denies Intent:  denies Means:  denies Homicidal Ideation: no Plan:  denies Intent:  denies Means:  denies AEB (as evidenced by):  Psychiatric Specialty Exam: Review of Systems  Constitutional: Negative.   HENT: Negative.   Eyes: Negative.   Respiratory: Negative.   Cardiovascular: Negative.   Gastrointestinal: Negative.   Genitourinary: Negative.   Musculoskeletal: Positive for joint pain.  Skin: Negative.   Neurological: Negative.   Endo/Heme/Allergies: Negative.   Psychiatric/Behavioral: Positive for depression and hallucinations. The patient is nervous/anxious.     Blood pressure 115/74, pulse 96, temperature 97.7 F (36.5 C), temperature source Oral, resp. rate 18, height 5\' 8"  (1.727 m), weight 85.276 kg (188 lb).Body mass index is 28.59 kg/(m^2).  General Appearance: Fairly Groomed  Patent attorney::  Fair  Speech:  Clear and Coherent  Volume:  Normal  Mood:  Anxious  Affect:  Restricted  Thought Process:  Goal Directed  Orientation:  Full (Time, Place, and Person)  Thought Content:  Hallucinations: Auditory  Suicidal Thoughts:  Yes.  without intent/plan  Homicidal Thoughts:  No  Memory:  Immediate;   Fair Recent;   Fair Remote;   Fair   Judgement:  Poor  Insight:  Lacking  Psychomotor Activity:  Normal  Concentration:  Fair  Recall:  Fair  Akathisia:  No  Handed:  Right  AIMS (if indicated):     Assets:  Communication Skills Desire for Improvement  Sleep:  Number of Hours: 5.25   Current Medications: Current Facility-Administered Medications  Medication Dose Route Frequency Provider Last Rate Last Dose  . acetaminophen (TYLENOL) tablet 650 mg  650 mg Oral Q6H PRN Earney Navy, NP   650 mg at 01/14/13 1524  . alum & mag hydroxide-simeth (MAALOX/MYLANTA) 200-200-20 MG/5ML suspension 30 mL  30 mL Oral Q4H PRN Earney Navy, NP   30 mL at 01/12/13 1853  . carbamazepine (TEGRETOL) tablet 200 mg  200 mg Oral BID Mojeed Akintayo   200 mg at 01/18/13 0813  . FLUoxetine (PROZAC) capsule 20 mg  20 mg Oral Daily Mojeed Akintayo   20 mg at 01/18/13 0814  . magnesium hydroxide (MILK OF MAGNESIA) suspension 30 mL  30 mL Oral Daily PRN Earney Navy, NP      . naproxen (NAPROSYN) tablet 250 mg  250 mg Oral BID WC Mojeed Akintayo   250 mg at 01/18/13 0814  . nicotine (NICODERM CQ - dosed in mg/24 hours) patch 14 mg  14 mg Transdermal Daily Mojeed Akintayo   14 mg at 01/17/13 0807  . QUEtiapine (SEROQUEL XR) 24 hr  tablet 300 mg  300 mg Oral QPC supper Mojeed Akintayo   300 mg at 01/17/13 2234    Lab Results: No results found for this or any previous visit (from the past 48 hour(s)).  Physical Findings: AIMS: Facial and Oral Movements Muscles of Facial Expression: None, normal Lips and Perioral Area: None, normal Jaw: None, normal Tongue: None, normal,Extremity Movements Upper (arms, wrists, hands, fingers): None, normal Lower (legs, knees, ankles, toes): None, normal, Trunk Movements Neck, shoulders, hips: None, normal, Overall Severity Severity of abnormal movements (highest score from questions above): None, normal Incapacitation due to abnormal movements: None, normal Patient's awareness of abnormal  movements (rate only patient's report): No Awareness, Dental Status Current problems with teeth and/or dentures?: No Does patient usually wear dentures?: No  CIWA:  CIWA-Ar Total: 0 COWS:  COWS Total Score: 0  Treatment Plan Summary: Daily contact with patient to assess and evaluate symptoms and progress in treatment Medication management  Plan:1. Admit for crisis management and stabilization. 2. Medication management to reduce current symptoms to base line and improve the  patient's overall level of functioning 3. Treat health problems as indicated. 4. Develop treatment plan to decrease risk of relapse upon discharge and the need for  readmission. 5. Psycho-social education regarding relapse prevention and self care. 6. Health care follow up as needed for medical problems. 7. continue Prozac Seroquel and Tegretol to address Symptoms of depression.  We will get Tegretol level on Monday. 8. ELOS 2-3 days   Medical Decision Making Problem Points:  Established problem, stable/improving (1), Review of last therapy session (1) and Review of psycho-social stressors (1) Data Points:  Order Aims Assessment (2) Review of medication regiment & side effects (2) Review of new medications or change in dosage (2)  I certify that inpatient services furnished can reasonably be expected to improve the patient's condition.   Tammi Boulier T.,MD 01/18/2013, 10:36 AM

## 2013-01-18 NOTE — Progress Notes (Signed)
Patient ID: Ryan Bartlett, male   DOB: 04/14/1996, 17 y.o.   MRN: [id number] Psychoeducational Group Note  Date:  01/18/2013 Time:  1000am  Group Topic/Focus:  Making Healthy Choices:   The focus of this group is to help patients identify negative/unhealthy choices they were using prior to admission and identify positive/healthier coping strategies to replace them upon discharge.  Participation Level:  Did Not Attend  Participation Quality:    Affect: Cognitive:  Insight: Engagement in Group: Additional Comments:  Spiritual group   [name] 01/18/2013,11:15 AM

## 2013-01-18 NOTE — Progress Notes (Signed)
D.  Pt. Has a flat affect, depressed mood.  Reports being depressed since the age of 66. Reports a family member he was close to died.  Presently denies HI and denies A/V hallucinations.  Endorses passive SI with a plan to electrocute himself using cord to charger for anklet bracelet.   A.  Encouraged pt. To verbalize feelings and attend groups. R.  Pt. Remains safe.

## 2013-01-19 MED ORDER — CARBAMAZEPINE 200 MG PO TABS: 200.0000 mg | ORAL_TABLET | Freq: Three times a day (TID) | ORAL | Status: DC

## 2013-01-19 MED ORDER — QUETIAPINE FUMARATE ER 300 MG PO TB24: 300.0000 mg | ORAL_TABLET | Freq: Every day | ORAL | Status: DC

## 2013-01-19 MED ORDER — FLUOXETINE HCL 20 MG PO CAPS: 20.0000 mg | ORAL_CAPSULE | Freq: Every day | ORAL | Status: DC

## 2013-01-19 NOTE — Progress Notes (Signed)
Patient ID: Ryan Bartlett, male   DOB: [birth year], 17 y.o.   MRN: [id number]  D: Patient sitting in dayroom with ankle bracelet on charge due to not charging it earlier. Reports doing o.k. No SI at present. Very drowsy and says ready for bed once charged. A: Staff will continue to monitor on q 15 minute checks, follow treatment plan, and give meds. R: Continues to charge bracelet and watch TV.

## 2013-01-19 NOTE — Tx Team (Signed)
  Interdisciplinary Treatment Plan Update   Date Reviewed:  01/19/2013  Time Reviewed:  11:20 AM  Progress in Treatment:   Attending groups: Yes Participating in groups: Yes Taking medication as prescribed: Yes  Tolerating medication: Yes Family/Significant other contact made: Yes  Patient understands diagnosis: Yes  Discussing patient identified problems/goals with staff: Yes Medical problems stabilized or resolved: Yes Denies suicidal/homicidal ideation: Yes  In tx team Patient has not harmed self or others: Yes  For review of initial/current patient goals, please see plan of care.  Estimated Length of Stay:  D/C today  Reason for Continuation of Hospitalization:   New Problems/Goals identified:  N/A  Discharge Plan or Barriers:   return to shelter, follow up with ACT team  Additional Comments:  Attendees:  Signature: Thedore Mins, MD 01/19/2013 11:20 AM   Signature: Richelle Ito, [name] 01/19/2013 11:20 AM  Signature: Verne Spurr, PA 01/19/2013 11:20 AM  Signature: Cammy Brochure, RN 01/19/2013 11:20 AM  Signature:  01/19/2013 11:20 AM  Signature:  01/19/2013 11:20 AM  Signature:   01/19/2013 11:20 AM  Signature:    Signature:    Signature:    Signature:    Signature:    Signature:      Scribe for Treatment Team:   Richelle Ito, [name]  01/19/2013 11:20 AM

## 2013-01-19 NOTE — [facility] Suicide Risk Assessment (Signed)
Suicide Risk Assessment  Discharge Assessment     Demographic Factors:  Male, Adolescent or young adult and Unemployed  Mental Status Per Nursing Assessment::   On Admission:  Suicidal ideation indicated by patient;Self-harm behaviors  Current Mental Status by Physician: patient denies suicidal ideation, intent or plan  Loss Factors: Decrease in vocational status and Financial problems/change in socioeconomic status  Historical Factors: Impulsivity  Risk Reduction Factors:   Positive social support and Positive therapeutic relationship  Continued Clinical Symptoms:  Alcohol/Substance Abuse/Dependencies  Cognitive Features That Contribute To Risk:  Closed-mindedness Polarized thinking    Suicide Risk:  Minimal: No identifiable suicidal ideation.  Patients presenting with no risk factors but with morbid ruminations; may be classified as minimal risk based on the severity of the depressive symptoms  Discharge Diagnoses:   AXIS I:  Bipolar affective disorder, mixed, moderate              Cannabis use disorder              Conduct disorder  AXIS II:  Cluster B Traits AXIS III:   Past Medical History  Diagnosis Date   AXIS IV:  economic problems, housing problems, other psychosocial or environmental problems, problems related to legal system/crime and problems related to social environment AXIS V:  61-70 mild symptoms  Plan Of Care/Follow-up recommendations:  Activity:  as tolerated Diet:  healthy Tests:  routine blood test Other:  patient to keep his after care appointment   Is patient on multiple antipsychotic therapies at discharge:  No   Has Patient had three or more failed trials of antipsychotic monotherapy by history:  No  Recommended Plan for Multiple Antipsychotic Therapies: N/A  Brison Fiumara,MD 01/19/2013, 10:12 AM

## 2013-01-19 NOTE — Progress Notes (Signed)
[facility] Adult Case Management Discharge Plan :  Will you be returning to the same living situation after discharge: No. At discharge, do you have transportation home?:Yes,  ACT team Do you have the ability to pay for your medications:Yes,  MCD  Release of information consent forms completed and in the chart;  Patient's signature needed at discharge.  Patient to Follow up at: Follow-up Information   Follow up with [facility]. (Will see you the day of d/c)    Contact information:   [address] [facility]  [phone]      Patient denies SI/HI:   Yes,  yes    Safety Planning and Suicide Prevention discussed:  Yes,  yes  [name] 01/19/2013, 11:18 AM

## 2013-01-19 NOTE — Progress Notes (Signed)
Recreation Therapy Notes  Date: 06.02.2014 Time: 9:30am Location: [address] Dayroom      Group Topic/Focus: Goal Setting  Participation Level: Did not attend  [employer], LRT/CTRS  Navi Erber L 01/19/2013 10:15 AM

## 2013-01-19 NOTE — Discharge Summary (Signed)
Physician Discharge Summary Note  Patient:  Ryan Bartlett is an 17 y.o., male MRN:  [id number] DOB:  01-27-1996 Patient phone:  [phone] (home)  Patient address:   [address] [place] [address],   Date of Admission:  01/12/2013 Date of Discharge: 01/19/2013  Reason for Admission:  Bipolar disorder and suicidal ideation  Discharge Diagnoses: Principal Problem:   Bipolar affective disorder, mixed, moderate Active Problems:   ADHD (attention deficit hyperactivity disorder), combined type  Review of Systems  Constitutional: Negative.  Negative for fever, chills, weight loss, malaise/fatigue and diaphoresis.  HENT: Negative for congestion and sore throat.   Eyes: Negative for blurred vision, double vision and photophobia.  Respiratory: Negative for cough, shortness of breath and wheezing.   Cardiovascular: Negative for chest pain, palpitations and PND.  Gastrointestinal: Negative for heartburn, nausea, vomiting, abdominal pain, diarrhea and constipation.  Musculoskeletal: Negative for myalgias, joint pain and falls.  Neurological: Negative for dizziness, tingling, tremors, sensory change, speech change, focal weakness, seizures, loss of consciousness, weakness and headaches.  Endo/Heme/Allergies: Negative for polydipsia. Does not bruise/bleed easily.  Psychiatric/Behavioral: Negative for depression, suicidal ideas, hallucinations, memory loss and substance abuse. The patient is not nervous/anxious and does not have insomnia.   Discharge Diagnoses:  AXIS I: Bipolar affective disorder, mixed, moderate  Cannabis use disorder  Conduct disorder  AXIS II: Cluster B Traits  AXIS III:  Past Medical History   Diagnosis  Date   AXIS IV: economic problems, housing problems, other psychosocial or environmental problems, problems related to legal system/crime and problems related to social environment  AXIS V: 61-70 mild symptoms     Level of Care:  OP  Hospital Course:  Ryan Bartlett was  admitted after presenting to the [facility] on the same day he was discharged from [facility] claiming initially that he had no place to go and that if he was discharged he would hang himself. He has a long history of mental illness including Bipolar affective disorder, ADHD, Conduct disorder, Personality disorder with cluster B traits, alcohol abuse and daily cannabis abuse.  Tele-psych was completed and in-patient admission was recommended.  His labs were unremarkable. He was noted to be wearing an ankle bracelet due to his upcoming court date for trespassing, larceny, conspiracy and damage to property.      He was accepted to [facility], given medical clearance and transferred to [facility] for further stabilization and medication management.  Upon arrival to the unit Ryan Bartlett stated he had no means to attempt suicide and did contract for safety. He was evaluated and restarted on his regular medication management for symptoms.  Initially Ryan Bartlett was resistant to the staff but did agree to follow the rules and work toward a suitable discharge plan for his recovery and stability.       He attended groups, took his medication as requested and was not a behavioral problem.  He worked closely with the CM to achieve housing upon discharge and to re-establish his care in the [facility] outpatient clinic with Dr. Lucianne Muss who agreed to continue seeing him if he would keep his appointments.     On the day of discharge Ryan Bartlett was able to agree to the follow up plan as noted below. He denied SI/HI and AVH. He was in full contact with reality and his judgement and insight had improved. He had also made positive contact with his family who agreed to be more supportive if Ryan Bartlett would follow their rules and discontinue using THC.  He was  discharged in much improved condition and appeared motivated to pursue follow up as planned.  Consults:  None  Significant Diagnostic Studies:  None  Discharge Vitals:   Blood pressure 123/80, pulse 78,  temperature 97.6 F (36.4 C), temperature source Oral, resp. rate 20, height 5\' 8"  (1.727 m), weight 85.276 kg (188 lb). Body mass index is 28.59 kg/(m^2). Lab Results:   No results found for this or any previous visit (from the past 72 hour(s)).  Physical Findings: AIMS: Facial and Oral Movements Muscles of Facial Expression: None, normal Lips and Perioral Area: None, normal Jaw: None, normal Tongue: None, normal,Extremity Movements Upper (arms, wrists, hands, fingers): None, normal Lower (legs, knees, ankles, toes): None, normal, Trunk Movements Neck, shoulders, hips: None, normal, Overall Severity Severity of abnormal movements (highest score from questions above): None, normal Incapacitation due to abnormal movements: None, normal Patient's awareness of abnormal movements (rate only patient's report): No Awareness, Dental Status Current problems with teeth and/or dentures?: No Does patient usually wear dentures?: No  CIWA:  CIWA-Ar Total: 0 COWS:  COWS Total Score: 0  Psychiatric Specialty Exam: See Psychiatric Specialty Exam and Suicide Risk Assessment completed by Attending Physician prior to discharge.  Discharge destination:  Home  Is patient on multiple antipsychotic therapies at discharge:  No   Has Patient had three or more failed trials of antipsychotic monotherapy by history:  No  Recommended Plan for Multiple Antipsychotic Therapies:  Not applicable  Discharge Orders   Future Orders Complete By Expires     Diet - low sodium heart healthy  As directed     Discharge instructions  As directed     Comments:      Take all of your medications as directed. Be sure to keep all of your follow up appointments.  If you are unable to keep your follow up appointment, call your Doctor's office to let them know, and reschedule.  Make sure that you have enough medication to last until your appointment. Be sure to get plenty of rest. Going to bed at the same time each night will  help. Try to avoid sleeping during the day.  Increase your activity as tolerated. Regular exercise will help you to sleep better and improve your mental health. Eating a heart healthy diet is recommended. Try to avoid salty or fried foods. Be sure to avoid all alcohol and illegal drugs.    Increase activity slowly  As directed         Medication List    STOP taking these medications       cyclobenzaprine 5 MG tablet  Commonly known as:  FLEXERIL     ibuprofen 200 MG tablet  Commonly known as:  ADVIL,MOTRIN     PARoxetine 20 MG tablet  Commonly known as:  PAXIL     QUEtiapine 100 MG tablet  Commonly known as:  SEROQUEL      TAKE these medications     Indication   carbamazepine 200 MG tablet  Commonly known as:  TEGRETOL  Take 1 tablet (200 mg total) by mouth 3 (three) times daily. For mood stabilization.   Indication:  Manic-Depression     FLUoxetine 20 MG capsule  Commonly known as:  PROZAC  Take 1 capsule (20 mg total) by mouth daily. For anxiety and depression.   Indication:  Depressive Phase of Manic-Depression     QUEtiapine 300 MG 24 hr tablet  Commonly known as:  SEROQUEL XR  Take 1 tablet (300 mg total) by  mouth daily after supper. For bipolar disorder.   Indication:  Manic-Depression, Schizophrenia           Follow-up Information   Follow up with [facility].   Contact information:   [address] [facility]  [phone]      Follow-up recommendations:   Activities: Resume activity as tolerated. Diet: Heart healthy low sodium diet Tests: Follow up testing will be determined by your out patient provider. Comments:     Total Discharge Time:  Greater than 30 minutes.  Signed: Morgen Linebaugh 01/19/2013, 9:08 AM

## 2013-01-19 NOTE — Progress Notes (Signed)
D:  Patient discharged to shelter.  All belongings retrieved from room and from locker number 5.  Patient denies suicidal ideation at this time.  A:  Reviewed all discharge instructions, medications, and follow up care with patient and with Erin from the ACT team.  Escorted the patient off the unit and to the front lobby.  R:  Verbalizes understanding of all discharge instructions.  Patient left the unit ambulatory and left the building with Erin.

## 2013-01-19 NOTE — [facility] Suicide Risk Assessment (Signed)
[facility] INPATIENT:  Family/Significant Other Suicide Prevention Education  Suicide Prevention Education:  Education Completed; Ryan Bartlett, mother, [phone]  has been identified by the patient as the family member/significant other with whom the patient will be residing, and identified as the person(s) who will aid the patient in the event of a mental health crisis (suicidal ideations/suicide attempt).  With written consent from the patient, the family member/significant other has been provided the following suicide prevention education, prior to the and/or following the discharge of the patient.  The suicide prevention education provided includes the following:  Suicide risk factors  Suicide prevention and interventions  National Suicide Hotline telephone number  [facility] assessment telephone number  [facility] Emergency Assistance 911  [facility] and/or Residential Mobile Crisis Unit telephone number  Request made of family/significant other to:  Remove weapons (e.g., guns, rifles, knives), all items previously/currently identified as safety concern.    Remove drugs/medications (over-the-counter, prescriptions, illicit drugs), all items previously/currently identified as a safety concern.  The family member/significant other verbalizes understanding of the suicide prevention education information provided.  The family member/significant other agrees to remove the items of safety concern listed above.  Pt will not be staying with mother.  He is d/cing to shelter.  Ryan Bartlett 01/19/2013, 12:15 PM

## 2013-01-21 NOTE — Progress Notes (Signed)
Patient Discharge Instructions:  After Visit Summary (AVS):   Faxed to:  01/21/13 Psychiatric Admission Assessment Note:   Faxed to:  01/21/13 Suicide Risk Assessment - Discharge Assessment:   Faxed to:  01/21/13 Faxed/Sent to the [facility] provider:  01/21/13 Faxed to [facility] @ [phone]  Jerelene Redden, 01/21/2013, 3:42 PM

## 2013-01-21 NOTE — ED Provider Notes (Signed)
Medical screening examination/treatment/procedure(s) were performed by non-physician practitioner and as supervising physician I was immediately available for consultation/collaboration.  Derwood Kaplan, MD 01/21/13 1118

## 2013-01-22 LAB — CARBAMAZEPINE, FREE AND TOTAL
Carbamazepine Metabolite -: 2.2 ug/mL — ABNORMAL HIGH (ref 0.2–2.0)
Carbamazepine Metabolite: 1.3 ug/mL — ABNORMAL HIGH (ref 0.1–1.0)
Carbamazepine, Bound: 4.2 ug/mL
Carbamazepine, Free: 1.3 ug/mL (ref 1.0–3.0)

## 2013-01-22 NOTE — ED Provider Notes (Signed)
Medical screening examination/treatment/procedure(s) were performed by non-physician practitioner and as supervising physician I was immediately available for consultation/collaboration.   Derwood Kaplan, MD 01/22/13 [phone]

## 2013-01-26 ENCOUNTER — Encounter (HOSPITAL_COMMUNITY): Payer: Self-pay | Admitting: Emergency Medicine

## 2013-01-26 ENCOUNTER — Emergency Department (HOSPITAL_COMMUNITY)
Admission: EM | Admit: 2013-01-26 | Discharge: 2013-01-26 | Disposition: A | Discharge status: Home / Self Care | Payer: Medicaid Other | Attending: Emergency Medicine | Admitting: Emergency Medicine

## 2013-01-26 DIAGNOSIS — M62838 Other muscle spasm: Secondary | ICD-10-CM | POA: Insufficient documentation

## 2013-01-26 DIAGNOSIS — IMO0001 Reserved for inherently not codable concepts without codable children: Secondary | ICD-10-CM | POA: Insufficient documentation

## 2013-01-26 LAB — POCT I-STAT, CHEM 8
BUN: 7 mg/dL (ref 6–23)
Calcium, Ion: 1.19 mmol/L (ref 1.12–1.23)
Chloride: 103 mEq/L (ref 96–112)
HCT: 44 % (ref 39.0–52.0)
Potassium: 3.8 mEq/L (ref 3.5–5.1)
Sodium: 139 mEq/L (ref 135–145)

## 2013-01-26 MED ORDER — METHOCARBAMOL 500 MG PO TABS: 500.0000 mg | ORAL_TABLET | Freq: Two times a day (BID) | ORAL | Status: DC

## 2013-01-26 NOTE — ED Provider Notes (Signed)
Medical screening examination/treatment/procedure(s) were performed by non-physician practitioner and as supervising physician I was immediately available for consultation/collaboration.  Doug Sou, MD 01/26/13 1715

## 2013-01-26 NOTE — ED Notes (Signed)
Pt presenting to ed with c/o muscle spasms onset x 1-2 weeks that woke him up out of his sleep last night

## 2013-01-26 NOTE — ED Provider Notes (Signed)
History     CSN: [id number]  Arrival date & time 01/26/13  0815   First MD Initiated Contact with Patient 01/26/13 [phone]      Chief Complaint  Patient presents with  . Spasms    (Consider location/radiation/quality/duration/timing/severity/associated sxs/prior treatment) HPI  17 year old male presents complaining of muscle spasm. Patient reports intermittent muscle spasm ongoing for the past 2 weeks. Spasm usually affected the right hand, sporadic, lasting only for a few seconds and resolved. He would have 2-3 episodes per day. Last night he had one episode of muscle spasm throughout body lasting for less than a minute and it wakes him up. No complaint of fever, chest pain, shortness of breath, numbness, weakness, rash. Endorse occasional muscle cramp. He has been eating and drinking as usual. Admits to drinking alcohol on occasion, last alcoholic drink was yesterday, 2 pints of Smirnoff.  Patient also admits to using marijuana but denies any other recreational drug use. Patient is a smoker. No recent medication changes.  Past Medical History  Diagnosis Date  . ADHD (attention deficit hyperactivity disorder)   . Unspecified episodic mood disorder   . Oppositional defiant disorder   . Bipolar disorder     Past Surgical History  Procedure Laterality Date  . No past surgeries      Family History  Problem Relation Age of Onset  . Depression Mother     History  Substance Use Topics  . Smoking status: Current Every Day Smoker -- 0.50 packs/day for 5 years    Types: Cigarettes  . Smokeless tobacco: Never Used  . Alcohol Use: 3.6 oz/week    6 Cans of beer per week     Comment: occassionally      Review of Systems  Constitutional: Negative for fever.  HENT: Negative for neck pain.   Respiratory: Negative for chest tightness.   Cardiovascular: Negative for chest pain.  Musculoskeletal: Positive for myalgias.  Skin: Negative for rash.  Neurological: Negative for headaches.   All other systems reviewed and are negative.    Allergies  Peanut-containing drug products and Lactose intolerance (gi)  Home Medications   Current Outpatient Rx  Name  Route  Sig  Dispense  Refill  . carbamazepine (TEGRETOL) 200 MG tablet   Oral   Take 200 mg by mouth 3 (three) times daily.         Marland Kitchen ibuprofen (ADVIL,MOTRIN) 200 MG tablet   Oral   Take 400 mg by mouth every 6 (six) hours as needed for pain.         Marland Kitchen PARoxetine (PAXIL) 20 MG tablet   Oral   Take 20 mg by mouth every morning.         Marland Kitchen QUEtiapine Fumarate (SEROQUEL XR) 150 MG 24 hr tablet   Oral   Take 150 mg by mouth at bedtime.           BP 151/98  Pulse 87  Temp(Src) 98.7 F (37.1 C) (Oral)  Resp 18  SpO2 99%  Physical Exam  Vitals reviewed. Constitutional: He appears well-developed and well-nourished. No distress.  HENT:  Head: Atraumatic.  Eyes: Conjunctivae are normal.  Neck: Neck supple.  Cardiovascular: Normal rate and regular rhythm.   Pulmonary/Chest: Effort normal and breath sounds normal.  Musculoskeletal: He exhibits no edema.  5 of 5 strength to all 4 extremities. Normal coordination on exam. Normal gait.  Skin: Skin is warm. No rash noted.  Psychiatric: He has a normal mood and affect.  ED Course  Procedures (including critical care time)  9:05 AM Sporadic muscle spasm for the past few weeks.  Physical exam is unremarkable.  Pt is afebrile, VSS.  Will check electrolytes.    9:56 AM Normal electrolytes, no active cramping.  Will d/c with muscle relaxant.  I counsel of alcohol cesssation, smoking cessation and to have pt f/u with PCP.    Labs Reviewed  POCT I-STAT, CHEM 8   No results found.   1. Muscle spasm       MDM  BP 151/98  Pulse 87  Temp(Src) 98.7 F (37.1 C) (Oral)  Resp 18  SpO2 99%         Fayrene Helper, PA-C 01/26/13 [phone]

## 2013-01-26 NOTE — ED Notes (Signed)
Called patient- no answer x1

## 2013-01-27 NOTE — Discharge Summary (Signed)
Seen and agreed. Ling Flesch, MD 

## 2013-01-31 ENCOUNTER — Encounter (HOSPITAL_COMMUNITY): Payer: Self-pay | Admitting: Family Medicine

## 2013-01-31 ENCOUNTER — Emergency Department (HOSPITAL_COMMUNITY)
Admission: EM | Admit: 2013-01-31 | Discharge: 2013-01-31 | Disposition: A | Discharge status: Home / Self Care | Payer: Medicaid Other | Attending: Emergency Medicine | Admitting: Emergency Medicine

## 2013-01-31 DIAGNOSIS — M25551 Pain in right hip: Secondary | ICD-10-CM

## 2013-01-31 DIAGNOSIS — M25561 Pain in right knee: Secondary | ICD-10-CM

## 2013-01-31 DIAGNOSIS — M25559 Pain in unspecified hip: Secondary | ICD-10-CM | POA: Insufficient documentation

## 2013-01-31 DIAGNOSIS — F329 Major depressive disorder, single episode, unspecified: Secondary | ICD-10-CM | POA: Insufficient documentation

## 2013-01-31 DIAGNOSIS — F3289 Other specified depressive episodes: Secondary | ICD-10-CM | POA: Insufficient documentation

## 2013-01-31 DIAGNOSIS — G8929 Other chronic pain: Secondary | ICD-10-CM | POA: Insufficient documentation

## 2013-01-31 DIAGNOSIS — M25569 Pain in unspecified knee: Secondary | ICD-10-CM | POA: Insufficient documentation

## 2013-01-31 DIAGNOSIS — F17200 Nicotine dependence, unspecified, uncomplicated: Secondary | ICD-10-CM | POA: Insufficient documentation

## 2013-01-31 DIAGNOSIS — Z79899 Other long term (current) drug therapy: Secondary | ICD-10-CM | POA: Insufficient documentation

## 2013-01-31 DIAGNOSIS — Z8659 Personal history of other mental and behavioral disorders: Secondary | ICD-10-CM | POA: Insufficient documentation

## 2013-01-31 DIAGNOSIS — F319 Bipolar disorder, unspecified: Secondary | ICD-10-CM | POA: Insufficient documentation

## 2013-01-31 HISTORY — DX: Major depressive disorder, single episode, unspecified: F32.9

## 2013-01-31 HISTORY — DX: Depression, unspecified: F32.A

## 2013-01-31 MED ORDER — IBUPROFEN 600 MG PO TABS: 600.0000 mg | ORAL_TABLET | Freq: Four times a day (QID) | ORAL | Status: DC | PRN

## 2013-01-31 MED ORDER — IBUPROFEN 200 MG PO TABS
600.0000 mg | ORAL_TABLET | Freq: Once | ORAL | Status: AC
  Administered 2013-01-31: 600 mg via ORAL
  Filled 2013-01-31: qty 3

## 2013-01-31 NOTE — ED Notes (Signed)
Per EMS, patient here for evaluation of right leg pain. States patient has had this pain for 9 years and that pain flares whenever he walks a lot. Patient reports that he has been walking a lot lately.

## 2013-01-31 NOTE — ED Provider Notes (Signed)
History    This chart was scribed for Renne Crigler, a non-physician practitioner working with Derwood Kaplan, MD by Frederik Pear, ED Scribe. This patient was seen in room WTR6/WTR6 and the patient's care was started at 1823   CSN: [id number]  Arrival date & time 01/31/13  1811   First MD Initiated Contact with Patient 01/31/13 1823      Chief Complaint  Patient presents with  . Leg Pain    (Consider location/radiation/quality/duration/timing/severity/associated sxs/prior treatment) The history is provided by the patient and medical records. No language interpreter was used.    HPI Comments: Ryan Bartlett is a 17 y.o. male who is homeless brought in by EMS with a h/o of chronic right knee pain for the last 8 years who presents to the Emergency Department complaining of worse than baseline, stabbing right knee pain that is aggravated by walking long distances and relieved by nothing that has been intermittent for the last 8 years. He states that he was seen for the same on 06/09 and discharged with robaxin, but is unable to get the medication filled because he is currently homeless.  Past Medical History  Diagnosis Date  . ADHD (attention deficit hyperactivity disorder)   . Unspecified episodic mood disorder   . Oppositional defiant disorder   . Bipolar disorder   . Depression     Past Surgical History  Procedure Laterality Date  . No past surgeries      Family History  Problem Relation Age of Onset  . Depression Mother     History  Substance Use Topics  . Smoking status: Current Every Day Smoker -- 0.50 packs/day for 5 years    Types: Cigarettes  . Smokeless tobacco: Never Used  . Alcohol Use: 3.6 oz/week    6 Cans of beer per week     Comment: occassionally      Review of Systems  Constitutional: Negative for fever and chills.  Respiratory: Negative for shortness of breath.   Gastrointestinal: Negative for nausea and vomiting.  Musculoskeletal: Positive  for arthralgias (right knee pain).  Neurological: Negative for weakness.    Allergies  Peanut-containing drug products and Lactose intolerance (gi)  Home Medications   Current Outpatient Rx  Name  Route  Sig  Dispense  Refill  . carbamazepine (TEGRETOL) 200 MG tablet   Oral   Take 200 mg by mouth 3 (three) times daily.         Marland Kitchen PARoxetine (PAXIL) 20 MG tablet   Oral   Take 20 mg by mouth every morning.         Marland Kitchen QUEtiapine Fumarate (SEROQUEL XR) 150 MG 24 hr tablet   Oral   Take 150 mg by mouth at bedtime.           Pulse 82  Temp(Src) 98.3 F (36.8 C) (Oral)  Resp 18  SpO2 99%  Physical Exam  Nursing note and vitals reviewed. Constitutional: He appears well-developed and well-nourished. No distress.  HENT:  Head: Normocephalic and atraumatic.  Eyes: EOM are normal. Pupils are equal, round, and reactive to light.  Neck: Normal range of motion. Neck supple. No tracheal deviation present.  Cardiovascular: Normal rate.   Pulmonary/Chest: Effort normal. No respiratory distress.  Abdominal: Soft. He exhibits no distension.  Musculoskeletal: Normal range of motion. He exhibits tenderness. He exhibits no edema.  Tenderness to palpation along the joint line of the right knee. Full ROM. Able to ambulate. Exam was Tenderness over greater tuberosity with palpation.  Neurological: He is alert.  Skin: Skin is warm and dry.  Psychiatric: He has a normal mood and affect. His behavior is normal.    ED Course  Procedures (including critical care time)  DIAGNOSTIC STUDIES: Oxygen Saturation is 99% on room air, normal by my interpretation.    COORDINATION OF CARE:  18:55- Discussed planned course of treatment with the patient, including ibuprofen, who is agreeable at this time.  19:15- Medication Orders- ibuprofen (advil, motrin) tablet 600 mg- once.   Labs Reviewed - No data to display No results found.   1. Right knee pain   2. Right hip pain    Patient seen  and examined.    Vital signs reviewed and are as follows: Filed Vitals:   01/31/13 1818  Pulse: 82  Temp: 98.3 F (36.8 C)  Resp: 18   Counseled on use NSAIDs.   Patient was counseled on RICE protocol and told to rest injury, use ice for no longer than 15 minutes every hour, compress the area, and elevate above the level of their heart as much as possible to reduce swelling.  Questions answered.  Patient verbalized understanding.    MDM  Chronic knee pain, NSAIDs indicated. Pt ambulatory. Do not suspect severe injury. LE is neurovascularly intact.    I personally performed the services described in this documentation, which was scribed in my presence. The recorded information has been reviewed and is accurate.       Renne Crigler, PA-C 01/31/13 2142

## 2013-02-01 NOTE — ED Provider Notes (Signed)
Medical screening examination/treatment/procedure(s) were performed by non-physician practitioner and as supervising physician I was immediately available for consultation/collaboration.  Cheyeanne Roadcap, MD 02/01/13 1511 

## 2013-03-11 ENCOUNTER — Emergency Department (HOSPITAL_COMMUNITY): Payer: Medicaid Other

## 2013-03-11 ENCOUNTER — Encounter (HOSPITAL_COMMUNITY): Payer: Self-pay | Admitting: Emergency Medicine

## 2013-03-11 ENCOUNTER — Emergency Department (HOSPITAL_COMMUNITY)
Admission: EM | Admit: 2013-03-11 | Discharge: 2013-03-11 | Disposition: A | Discharge status: Home / Self Care | Payer: Medicaid Other | Attending: Emergency Medicine | Admitting: Emergency Medicine

## 2013-03-11 DIAGNOSIS — Y9301 Activity, walking, marching and hiking: Secondary | ICD-10-CM | POA: Insufficient documentation

## 2013-03-11 DIAGNOSIS — Z79899 Other long term (current) drug therapy: Secondary | ICD-10-CM | POA: Insufficient documentation

## 2013-03-11 DIAGNOSIS — X503XXA Overexertion from repetitive movements, initial encounter: Secondary | ICD-10-CM | POA: Insufficient documentation

## 2013-03-11 DIAGNOSIS — Z8659 Personal history of other mental and behavioral disorders: Secondary | ICD-10-CM | POA: Insufficient documentation

## 2013-03-11 DIAGNOSIS — Y929 Unspecified place or not applicable: Secondary | ICD-10-CM | POA: Insufficient documentation

## 2013-03-11 DIAGNOSIS — S93409A Sprain of unspecified ligament of unspecified ankle, initial encounter: Secondary | ICD-10-CM | POA: Insufficient documentation

## 2013-03-11 DIAGNOSIS — F319 Bipolar disorder, unspecified: Secondary | ICD-10-CM | POA: Insufficient documentation

## 2013-03-11 DIAGNOSIS — F17200 Nicotine dependence, unspecified, uncomplicated: Secondary | ICD-10-CM | POA: Insufficient documentation

## 2013-03-11 MED ORDER — IBUPROFEN 800 MG PO TABS: 800.0000 mg | ORAL_TABLET | Freq: Three times a day (TID) | ORAL | Status: DC

## 2013-03-11 NOTE — ED Notes (Signed)
Ortho tech called for application of ASO splint and crutches.

## 2013-03-11 NOTE — ED Notes (Signed)
Per EMS: Pt picked up from bench.  States that his right ankle just started hurting so he called EMS.  Denies injury.

## 2013-03-11 NOTE — ED Provider Notes (Signed)
History    CSN: [id number] Arrival date & time 03/11/13  1525  First MD Initiated Contact with Patient 03/11/13 1630     Chief Complaint  Patient presents with  . Ankle Pain   (Consider location/radiation/quality/duration/timing/severity/associated sxs/prior Treatment) HPI Comments: Patient comes to the ER by ambulance for evaluation of ankle pain. Patient reports that he twisted his ankle while walking, stumbled over a curb. Patient is complaining of pain on the outside portion of the ankle. He is able to bear weight, but it hurts. Pain is moderate to severe when he walks on it. No other injury.  Patient is a 17 y.o. male presenting with ankle pain.  Ankle Pain  Past Medical History  Diagnosis Date  . ADHD (attention deficit hyperactivity disorder)   . Unspecified episodic mood disorder   . Oppositional defiant disorder   . Bipolar disorder   . Depression    Past Surgical History  Procedure Laterality Date  . No past surgeries     Family History  Problem Relation Age of Onset  . Depression Mother    History  Substance Use Topics  . Smoking status: Current Every Day Smoker -- 0.50 packs/day for 5 years    Types: Cigarettes  . Smokeless tobacco: Never Used  . Alcohol Use: 3.6 oz/week    6 Cans of beer per week     Comment: occassionally    Review of Systems  Musculoskeletal: Positive for arthralgias.    Allergies  Peanut-containing drug products and Lactose intolerance (gi)  Home Medications   Current Outpatient Rx  Name  Route  Sig  Dispense  Refill  . carbamazepine (TEGRETOL) 200 MG tablet   Oral   Take 200 mg by mouth 3 (three) times daily.         Marland Kitchen PARoxetine (PAXIL) 20 MG tablet   Oral   Take 20 mg by mouth every morning.         Marland Kitchen QUEtiapine Fumarate (SEROQUEL XR) 150 MG 24 hr tablet   Oral   Take 300 mg by mouth at bedtime.           BP 133/74  Pulse 87  Resp 16  SpO2 100% Physical Exam  Constitutional: He is oriented to person,  place, and time. He appears well-developed and well-nourished. No distress.  HENT:  Head: Normocephalic and atraumatic.  Right Ear: Hearing normal.  Left Ear: Hearing normal.  Nose: Nose normal.  Mouth/Throat: Oropharynx is clear and moist and mucous membranes are normal.  Eyes: Conjunctivae and EOM are normal. Pupils are equal, round, and reactive to light.  Neck: Normal range of motion. Neck supple.  Cardiovascular: Regular rhythm, S1 normal and S2 normal.  Exam reveals no gallop and no friction rub.   No murmur heard. Pulmonary/Chest: Effort normal and breath sounds normal. No respiratory distress. He exhibits no tenderness.  Abdominal: Soft. Normal appearance and bowel sounds are normal. There is no hepatosplenomegaly. There is no tenderness. There is no rebound, no guarding, no tenderness at McBurney's point and negative Murphy's sign. No hernia.  Musculoskeletal: Normal range of motion.       Right ankle: He exhibits normal range of motion, no swelling, no ecchymosis, no deformity and no laceration. Tenderness. Lateral malleolus tenderness found. Achilles tendon exhibits no pain and no defect.  Neurological: He is alert and oriented to person, place, and time. He has normal strength. No cranial nerve deficit or sensory deficit. Coordination normal. GCS eye subscore is 4. GCS verbal  subscore is 5. GCS motor subscore is 6.  Skin: Skin is warm, dry and intact. No rash noted. No cyanosis.  Psychiatric: He has a normal mood and affect. His speech is normal and behavior is normal. Thought content normal.    ED Course  Procedures (including critical care time) Labs Reviewed - No data to display Dg Ankle Complete Left  03/11/2013   *RADIOLOGY REPORT*  Clinical Data: Hurt left ankle walking today, left ankle pain  LEFT ANKLE COMPLETE - 3+ VIEW  Comparison: None  Findings: Osseous mineralization normal. Ankle mortise intact. No acute fracture, dislocation or bone destruction. Soft tissues  unremarkable.  IMPRESSION: No acute osseous abnormalities.   Original Report Authenticated By: Ulyses Southward, M.D.   Diagnosis: Ankle Sprain  MDM  Patient presents with ankle pain. X-ray was negative.  Gilda Crease, MD 03/11/13 [phone]

## 2013-05-21 ENCOUNTER — Emergency Department (HOSPITAL_COMMUNITY)
Admission: EM | Admit: 2013-05-21 | Discharge: 2013-05-22 | Disposition: A | Discharge status: Home / Self Care | Payer: Medicaid Other | Attending: Emergency Medicine | Admitting: Emergency Medicine

## 2013-05-21 ENCOUNTER — Encounter (HOSPITAL_COMMUNITY): Payer: Self-pay | Admitting: Emergency Medicine

## 2013-05-21 DIAGNOSIS — R440 Auditory hallucinations: Secondary | ICD-10-CM

## 2013-05-21 DIAGNOSIS — F39 Unspecified mood [affective] disorder: Secondary | ICD-10-CM | POA: Insufficient documentation

## 2013-05-21 DIAGNOSIS — R45851 Suicidal ideations: Secondary | ICD-10-CM | POA: Insufficient documentation

## 2013-05-21 DIAGNOSIS — F913 Oppositional defiant disorder: Secondary | ICD-10-CM | POA: Insufficient documentation

## 2013-05-21 DIAGNOSIS — F1210 Cannabis abuse, uncomplicated: Secondary | ICD-10-CM | POA: Insufficient documentation

## 2013-05-21 DIAGNOSIS — Z91199 Patient's noncompliance with other medical treatment and regimen due to unspecified reason: Secondary | ICD-10-CM | POA: Insufficient documentation

## 2013-05-21 DIAGNOSIS — Z9119 Patient's noncompliance with other medical treatment and regimen: Secondary | ICD-10-CM | POA: Insufficient documentation

## 2013-05-21 DIAGNOSIS — F259 Schizoaffective disorder, unspecified: Secondary | ICD-10-CM | POA: Insufficient documentation

## 2013-05-21 DIAGNOSIS — F29 Unspecified psychosis not due to a substance or known physiological condition: Secondary | ICD-10-CM

## 2013-05-21 DIAGNOSIS — F17200 Nicotine dependence, unspecified, uncomplicated: Secondary | ICD-10-CM | POA: Insufficient documentation

## 2013-05-21 DIAGNOSIS — F909 Attention-deficit hyperactivity disorder, unspecified type: Secondary | ICD-10-CM | POA: Insufficient documentation

## 2013-05-21 LAB — CBC
HCT: 41.9 % (ref 39.0–52.0)
Hemoglobin: 14.4 g/dL (ref 13.0–17.0)
MCHC: 34.4 g/dL (ref 30.0–36.0)
RBC: 5.1 MIL/uL (ref 4.22–5.81)
WBC: 8.3 10*3/uL (ref 4.0–10.5)

## 2013-05-21 LAB — COMPREHENSIVE METABOLIC PANEL
ALT: 38 U/L (ref 0–53)
Albumin: 4.3 g/dL (ref 3.5–5.2)
Alkaline Phosphatase: 63 U/L (ref 39–117)
Chloride: 102 mEq/L (ref 96–112)
GFR calc Af Amer: >90 mL/min (ref >90)
Glucose, Bld: 95 mg/dL (ref 70–99)
Potassium: 3.5 mEq/L (ref 3.5–5.1)
Sodium: 140 mEq/L (ref 135–145)
Total Bilirubin: 0.4 mg/dL (ref 0.3–1.2)
Total Protein: 7.5 g/dL (ref 6.0–8.3)

## 2013-05-21 LAB — ACETAMINOPHEN LEVEL: Acetaminophen (Tylenol), Serum: <15 ug/mL (ref 10–30)

## 2013-05-21 LAB — RAPID URINE DRUG SCREEN, HOSP PERFORMED
Barbiturates: NOT DETECTED
Benzodiazepines: NOT DETECTED
Cocaine: NOT DETECTED
Tetrahydrocannabinol: POSITIVE — AB

## 2013-05-21 MED ORDER — RISPERIDONE 2 MG PO TABS
2.0000 mg | ORAL_TABLET | Freq: Every day | ORAL | Status: DC
  Administered 2013-05-21 – 2013-05-22 (×2): 2 mg via ORAL
  Filled 2013-05-21 (×2): qty 1

## 2013-05-21 MED ORDER — NICOTINE 21 MG/24HR TD PT24
21.0000 mg | MEDICATED_PATCH | Freq: Every day | TRANSDERMAL | Status: DC
  Administered 2013-05-21: 21 mg via TRANSDERMAL
  Filled 2013-05-21: qty 1

## 2013-05-21 MED ORDER — ZOLPIDEM TARTRATE 5 MG PO TABS: 5.0000 mg | ORAL_TABLET | Freq: Every evening | ORAL | Status: DC | PRN

## 2013-05-21 MED ORDER — ONDANSETRON HCL 4 MG PO TABS: 4.0000 mg | ORAL_TABLET | Freq: Three times a day (TID) | ORAL | Status: DC | PRN

## 2013-05-21 MED ORDER — IBUPROFEN 200 MG PO TABS: 600.0000 mg | ORAL_TABLET | Freq: Three times a day (TID) | ORAL | Status: DC | PRN

## 2013-05-21 MED ORDER — LORAZEPAM 1 MG PO TABS: 1.0000 mg | ORAL_TABLET | Freq: Three times a day (TID) | ORAL | Status: DC | PRN

## 2013-05-21 MED ORDER — ACETAMINOPHEN 325 MG PO TABS: 650.0000 mg | ORAL_TABLET | ORAL | Status: DC | PRN

## 2013-05-21 MED ORDER — ALUM & MAG HYDROXIDE-SIMETH 200-200-20 MG/5ML PO SUSP: 30.0000 mL | ORAL | Status: DC | PRN

## 2013-05-21 NOTE — BH Assessment (Addendum)
Assessment Note   Patient is a 17 year old black male that reports SI with a plan to kill himself by cutting his wrist or by hitting himself in the head with his boot.  Patient reports that he has to kill himself because the howling dogs are telling him to do it.  Patient reports that he can, "talk to and understand what all dogs are saying".  Patient reports that he has been able to understand dogs since he was 54 years old.  Patient reports that he hears dogs howling and talking to him on a daily basis.  Patient reports that he has not been taking his medication for one week.  Patient reports that he is not able to contract for safety.   Patient reports a prior history of psychiatric hospitalizations.  Patient is a poor historian and was not able to tell me when or where he was hospitalized for previous suicide attemepts.   Documentation in the epic chart does not reports any incident. 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  Patient reports a  history of Bipolar Disorder, Schizophrenia, Oppositional Defiant Disorder, ADHD, and Depression.    Patient reports a history of cutting and biting himself.  Patient reports that he began biting himself when he was 17 years old.  Patient reports that he bites himself daily.  Patient reports that he began cutting himself when he was 17 years old. Patient reports that he cuts himself in the wrist daily.   Patient denies denies homicidal ideations as well as any illicit drug use.    Axis I: Schizoaffective Disorder Axis II: Deferred Axis III:  Past Medical History  Diagnosis Date  . ADHD (attention deficit hyperactivity disorder)   . Unspecified episodic mood disorder   . Oppositional defiant disorder   . Bipolar disorder   . Depression    Axis IV: economic problems, educational problems, occupational problems, other psychosocial or environmental problems, problems related to social environment, problems with access to  health care services and problems with primary support group Axis V: 21-30 behavior considerably influenced by delusions or hallucinations OR serious impairment in judgment, communication OR inability to function in almost all areas  Past Medical History:  Past Medical History  Diagnosis Date  . ADHD (attention deficit hyperactivity disorder)   . Unspecified episodic mood disorder   . Oppositional defiant disorder   . Bipolar disorder   . Depression     Past Surgical History  Procedure Laterality Date  . No past surgeries      Family History:  Family History  Problem Relation Age of Onset  . Depression Mother     Social History:  reports that he has been smoking Cigarettes.  He has a 2.5 pack-year smoking history. He has never used smokeless tobacco. He reports that he drinks about 3.6 ounces of alcohol per week. He reports that he uses illicit drugs (Marijuana).  Additional Social History:     CIWA: CIWA-Ar BP: 130/73 mmHg Pulse Rate: 86 COWS:    Allergies:  Allergies  Allergen Reactions  . Peanut-Containing Drug Products Anaphylaxis  . Lactose Intolerance (Gi) Diarrhea and Nausea And Vomiting    Home Medications:  (Not in a hospital admission)  OB/GYN Status:  No LMP for male patient.  General Assessment Data Location of Assessment: [facility] ED Is this a Tele or Face-to-Face Assessment?: Face-to-Face Is this an Initial Assessment or a Re-assessment for this encounter?: Initial Assessment Living Arrangements:  Other relatives (Sister for one week) Can pt return to current living arrangement?: Yes Admission Status: Voluntary Is patient capable of signing voluntary admission?: Yes Transfer from: Acute Hospital Referral Source: Self/Family/Friend  Medical Screening Exam [facility] Walk-in ONLY) Medical Exam completed: Yes  [facility] Crisis Care Plan Living Arrangements: Other relatives (Sister for one week)     Risk to self Suicidal Ideation: Yes-Currently  Present Suicidal Intent: Yes-Currently Present Is patient at risk for suicide?: Yes Suicidal Plan?: Yes-Currently Present Specify Current Suicidal Plan: cut himself or hit himself in the head with his boot Access to Means: Yes Specify Access to Suicidal Means: anything sharp or his boot What has been your use of drugs/alcohol within the last 12 months?: none Previous Attempts/Gestures: Yes How many times?: 5 (Patint reports that there are too many to count. ) Other Self Harm Risks: cutting and bitting Triggers for Past Attempts: Unpredictable Intentional Self Injurious Behavior: Cutting (Bitting ) Comment - Self Injurious Behavior: Patient started cutting at the age of 17 years old  (Patient started bitting at the age of 17 years old. ) Family Suicide History: No Recent stressful life event(s): Other (Comment) (Moved out his mothers home a week.) Persecutory voices/beliefs?: Yes Depression: No Substance abuse history and/or treatment for substance abuse?: No Suicide prevention information given to non-admitted patients: Yes  Risk to Others Homicidal Ideation: No Thoughts of Harm to Others: No Current Homicidal Intent: No Current Homicidal Plan: No Access to Homicidal Means: No Identified Victim: None Assessment of Violence: None Noted Violent Behavior Description: calm Does patient have access to weapons?: No Criminal Charges Pending?: No Does patient have a court date: No  Psychosis Hallucinations: Auditory Delusions: Grandiose (He can speak to and understand dogs.)  Mental Status Report Appear/Hygiene: Disheveled;Body odor;Poor hygiene Eye Contact: Fair Motor Activity: Freedom of movement;Restlessness Speech: Soft Level of Consciousness: Alert Mood: Suspicious Affect: Preoccupied;Apprehensive Anxiety Level: None Thought Processes: Flight of Ideas Judgement: Unimpaired Orientation: Person;Place;Time;Situation Obsessive Compulsive Thoughts/Behaviors: None  Cognitive  Functioning Concentration: Decreased Memory: Recent Impaired;Remote Impaired IQ: Average Insight: Fair Impulse Control: Poor Appetite: Fair Weight Loss: 0 Weight Gain: 0 Sleep: Decreased Total Hours of Sleep: 5 Vegetative Symptoms: Not bathing;Decreased grooming  ADLScreening [facility] Assessment Services) Patient's cognitive ability adequate to safely complete daily activities?: Yes Patient able to express need for assistance with ADLs?: Yes Independently performs ADLs?: Yes (appropriate for developmental age)  Prior Inpatient Therapy Prior Inpatient Therapy: Yes Prior Therapy Dates: unable to remember Prior Therapy Facilty/Provider(s): unable to remember  Reason for Treatment: SI  Prior Outpatient Therapy Prior Outpatient Therapy: Yes Prior Therapy Dates: ongiong  Prior [facility] Reason for Treatment: ACTT Team   ADL Screening (condition at time of admission) Patient's cognitive ability adequate to safely complete daily activities?: Yes Patient able to express need for assistance with ADLs?: Yes Independently performs ADLs?: Yes (appropriate for developmental age)         Values / Beliefs Cultural Requests During Hospitalization: None Spiritual Requests During Hospitalization: None        Additional Information 1:1 In Past 12 Months?: No CIRT Risk: No Elopement Risk: No Does patient have medical clearance?: Yes     Disposition:  Disposition Initial Assessment Completed for this Encounter: Yes Disposition of Patient: Other dispositions Other disposition(s): Other (Comment) (Pending psych consult )  On Site Evaluation by:   Reviewed with Physician:    Phillip Heal LaVerne 05/21/2013 4:23 AM

## 2013-05-21 NOTE — BHH Counselor (Signed)
Writer was informed that there are not any beds at [facility]  Writer informed the nurse working with the patient that his disposition will be pending the psych consult at 8:30am today.    Writer informed the [facility] Office and spoke to Berna Spare) regarding the patients disposition so that it can be noted on the log in the Assessment Office.
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
## 2013-05-21 NOTE — ED Notes (Signed)
Pt reports he has been off of his medication x 1 week., stated he was still feeling suicidal on his Paxil and Tegretol.

## 2013-05-21 NOTE — ED Notes (Signed)
Patient sitting up in bed watching t.v. No s/s of distress noted. Pt states he has passive SI but has felt that way since he was 17 yo.

## 2013-05-21 NOTE — ED Notes (Signed)
Patient arrived to the unit via ambulatory with a steady gait.

## 2013-05-21 NOTE — ED Notes (Signed)
Per [employer], pt reports SI, hx of depression since he was 11. Pt calm and cooperative at this time.

## 2013-05-21 NOTE — ED Provider Notes (Signed)
Medical screening examination/treatment/procedure(s) were performed by non-physician practitioner and as supervising physician I was immediately available for consultation/collaboration.  Dierdra Salameh, MD 05/21/13 2300 

## 2013-05-21 NOTE — ED Provider Notes (Signed)
CSN: [id number]     Arrival date & time 05/21/13  0059 History   First MD Initiated Contact with Patient 05/21/13 0114     Chief Complaint  Patient presents with  . Medical Clearance   (Consider location/radiation/quality/duration/timing/severity/associated sxs/prior Treatment) HPI Comments: Patient is a 17 year old male with a history of bipolar disorder, oppositional defiant disorder, ADHD, and depression who presents for suicidal ideations. Patient states that he has been having associated auditory hallucinations and that the voices have been telling him to kill himself. Patient denies taking any of his prescribed antipsychotics; he endorses recently been weaned off of Seroquel because he was self medicating inappropriately. Patient denies homicidal ideations as well as any illicit drug use. He denies recent alcohol use. Patient denies plan of suicide. He has no pain complaints at this time.  The history is provided by the patient. No language interpreter was used.    Past Medical History  Diagnosis Date  . ADHD (attention deficit hyperactivity disorder)   . Unspecified episodic mood disorder   . Oppositional defiant disorder   . Bipolar disorder   . Depression    Past Surgical History  Procedure Laterality Date  . No past surgeries     Family History  Problem Relation Age of Onset  . Depression Mother    History  Substance Use Topics  . Smoking status: Current Every Day Smoker -- 0.50 packs/day for 5 years    Types: Cigarettes  . Smokeless tobacco: Never Used  . Alcohol Use: 3.6 oz/week    6 Cans of beer per week     Comment: occassionally    Review of Systems  Constitutional: Negative for fever.  Psychiatric/Behavioral: Positive for suicidal ideas and hallucinations.  All other systems reviewed and are negative.    Allergies  Peanut-containing drug products and Lactose intolerance (gi)  Home Medications   Current Outpatient Rx  Name  Route  Sig  Dispense   Refill  . carbamazepine (TEGRETOL) 200 MG tablet   Oral   Take 200 mg by mouth 3 (three) times daily.         Marland Kitchen PARoxetine (PAXIL) 20 MG tablet   Oral   Take 20 mg by mouth every morning.          BP 109/65  Pulse 60  Temp(Src) 97.7 F (36.5 C) (Oral)  Resp 18  SpO2 97%  Physical Exam  Nursing note and vitals reviewed. Constitutional: He is oriented to person, place, and time. He appears well-developed and well-nourished. No distress.  HENT:  Head: Normocephalic and atraumatic.  Mouth/Throat: Oropharynx is clear and moist. No oropharyngeal exudate.  Eyes: Conjunctivae and EOM are normal. Pupils are equal, round, and reactive to light. No scleral icterus.  Neck: Normal range of motion. Neck supple.  Cardiovascular: Normal rate, regular rhythm and normal heart sounds.   Pulmonary/Chest: Effort normal and breath sounds normal. No respiratory distress. He has no wheezes. He has no rales.  Abdominal: Soft. He exhibits no distension. There is no tenderness.  Musculoskeletal: Normal range of motion.  Neurological: He is alert and oriented to person, place, and time.  Skin: Skin is warm and dry. No rash noted. He is not diaphoretic. No erythema. No pallor.  Psychiatric: His speech is normal. His affect is blunt. He is not agitated and not aggressive. Cognition and memory are normal. He expresses suicidal ideation. He expresses no homicidal ideation. He expresses no homicidal plans.    ED Course  Procedures (including critical care time)  Labs Review Labs Reviewed  SALICYLATE LEVEL - Abnormal; Notable for the following:    Salicylate Lvl <2.0 (*)    All other components within normal limits  URINE RAPID DRUG SCREEN (HOSP PERFORMED) - Abnormal; Notable for the following:    Tetrahydrocannabinol POSITIVE (*)    All other components within normal limits  ACETAMINOPHEN LEVEL  CBC  COMPREHENSIVE METABOLIC PANEL  ETHANOL   Imaging Review No results found.  MDM   1. Suicidal  ideations   2. Auditory hallucinations     Patient presents for suicidal ideations with auditory hallucinations. He states he has been noncompliant with his antipsychotic medications. Physical exam unremarkable. Urine drug screen positive for THC. Patient medically cleared. Pending TTS eval. Temp psych orders placed.    Antony Madura, PA-C 05/21/13 [phone]

## 2013-05-22 ENCOUNTER — Inpatient Hospital Stay (HOSPITAL_COMMUNITY)
Admission: AD | Admit: 2013-05-22 | Discharge: 2013-06-01 | DRG: 885 | Disposition: A | Discharge status: Home / Self Care | Payer: Medicaid Other | Source: Intra-hospital | Attending: Psychiatry | Admitting: Psychiatry

## 2013-05-22 ENCOUNTER — Encounter (HOSPITAL_COMMUNITY): Payer: Self-pay | Admitting: *Deleted

## 2013-05-22 DIAGNOSIS — F902 Attention-deficit hyperactivity disorder, combined type: Secondary | ICD-10-CM

## 2013-05-22 DIAGNOSIS — F3162 Bipolar disorder, current episode mixed, moderate: Secondary | ICD-10-CM | POA: Diagnosis present

## 2013-05-22 DIAGNOSIS — F913 Oppositional defiant disorder: Secondary | ICD-10-CM | POA: Diagnosis present

## 2013-05-22 DIAGNOSIS — F259 Schizoaffective disorder, unspecified: Secondary | ICD-10-CM

## 2013-05-22 DIAGNOSIS — F909 Attention-deficit hyperactivity disorder, unspecified type: Secondary | ICD-10-CM | POA: Diagnosis present

## 2013-05-22 DIAGNOSIS — F22 Delusional disorders: Secondary | ICD-10-CM

## 2013-05-22 DIAGNOSIS — R45851 Suicidal ideations: Secondary | ICD-10-CM

## 2013-05-22 DIAGNOSIS — F314 Bipolar disorder, current episode depressed, severe, without psychotic features: Principal | ICD-10-CM

## 2013-05-22 DIAGNOSIS — F912 Conduct disorder, adolescent-onset type: Secondary | ICD-10-CM

## 2013-05-22 DIAGNOSIS — F3130 Bipolar disorder, current episode depressed, mild or moderate severity, unspecified: Secondary | ICD-10-CM | POA: Diagnosis present

## 2013-05-22 DIAGNOSIS — F1210 Cannabis abuse, uncomplicated: Secondary | ICD-10-CM

## 2013-05-22 DIAGNOSIS — Z79899 Other long term (current) drug therapy: Secondary | ICD-10-CM

## 2013-05-22 MED ORDER — RISPERIDONE 2 MG PO TABS
2.0000 mg | ORAL_TABLET | Freq: Every day | ORAL | Status: DC
  Filled 2013-05-22: qty 1

## 2013-05-22 MED ORDER — ALUM & MAG HYDROXIDE-SIMETH 200-200-20 MG/5ML PO SUSP: 30.0000 mL | ORAL | Status: DC | PRN

## 2013-05-22 MED ORDER — ACETAMINOPHEN 325 MG PO TABS
650.0000 mg | ORAL_TABLET | Freq: Four times a day (QID) | ORAL | Status: DC | PRN
  Administered 2013-05-24 – 2013-05-31 (×5): 650 mg via ORAL
  Filled 2013-05-22: qty 2

## 2013-05-22 MED ORDER — INFLUENZA VAC SPLIT QUAD 0.5 ML IM SUSP
0.5000 mL | INTRAMUSCULAR | Status: AC
  Administered 2013-05-23: 0.5 mL via INTRAMUSCULAR
  Filled 2013-05-22: qty 0.5

## 2013-05-22 MED ORDER — MAGNESIUM HYDROXIDE 400 MG/5ML PO SUSP: 30.0000 mL | Freq: Every day | ORAL | Status: DC | PRN

## 2013-05-22 NOTE — Progress Notes (Signed)
17 year old male patient admitted on voluntary basis. On admission, pt reports that he was brought in by the police and the reason being was that he told a friend that he was having auditory hallucinations to hit himself in the head with a boot. This person then called the police and was subsequently brought to the hospital. Pt does endorse auditory hallucinations and feels his medications that he is prescribed are not working to fix this problem and wants to find a medication that does. Pt endorses marijuana usage and states he self medicates as it helps with the voices. Pt reports that he lives with his sister and plans to discharge there. Pt does report that he has an ACT team that follows him. Pt does endorse depression and suicidal thoughts but is able to contract for safety while on the unit. Pt reports that he was last hospitalized at [facility] during the summer and believes it was June when he was there. Pt was oriented to the unit and safety maintained.

## 2013-05-22 NOTE — Tx Team (Signed)
Initial Interdisciplinary Treatment Plan  PATIENT STRENGTHS: (choose at least two) Ability for insight Average or above average intelligence Capable of independent living  PATIENT STRESSORS: Substance abuse   PROBLEM LIST: Problem List/Patient Goals Date to be addressed Date deferred Reason deferred Estimated date of resolution  Auditory Hallucinations 05/22/13     Depression 05/22/13                                                DISCHARGE CRITERIA:  Ability to meet basic life and health needs Improved stabilization in mood, thinking, and/or behavior Verbal commitment to aftercare and medication compliance  PRELIMINARY DISCHARGE PLAN: Attend aftercare/continuing care group Return to previous living arrangement  PATIENT/FAMIILY INVOLVEMENT: This treatment plan has been presented to and reviewed with the patient, Ryan Bartlett, and/or family member, .  The patient and family have been given the opportunity to ask questions and make suggestions.  Ryan Bartlett, [facility] 05/22/2013, 11:42 PM

## 2013-05-22 NOTE — Progress Notes (Signed)
   CARE MANAGEMENT ED NOTE 05/22/2013  Patient:  TYDARIUS, YAWN   Account Number:  [id number]  Date Initiated:  05/22/2013  Documentation initiated by:  Edd Arbour  Subjective/Objective Assessment:   17 yr old male medicaid [place] access     Subjective/Objective Assessment Detail:     Action/Plan:   Cm provided pt with a list of medicaid & self pay [place] providers   Action/Plan Detail:   Anticipated DC Date:       Status Recommendation to Physician:   Result of Recommendation:    Other ED Services  Consult Working Psychologist, educational  Other  Outpatient Services - Pt will follow up  PCP issues    Choice offered to / List presented to:            Status of service:  Completed, signed off  ED Comments:   ED Comments Detail:

## 2013-05-22 NOTE — Consult Note (Signed)
Patient Identification:  Ryan Bartlett Date of Evaluation:  05/22/2013   History of Present Illness:  Patient came in seeking treatment for auditory hallucination with the voice telling him to hit his head on the wall.  Patient denies Suicide but states he is afraid to go home and "do something in front of kids"  He had very poor eye contact during the interview and was vague in answering some questions.  We will admit him to our 400 hall and manage his medication.  He appears anxious and is not able to contract for safety.  He denies suicide/homicide at this time but states he might act on his auditory hallucination and hit hs head on the wall.  Past Psychiatric History: Schizoaffective d/o   Past Medical History:     Past Medical History  Diagnosis Date  . ADHD (attention deficit hyperactivity disorder)   . Unspecified episodic mood disorder   . Oppositional defiant disorder   . Bipolar disorder   . Depression        Past Surgical History  Procedure Laterality Date  . No past surgeries      Allergies:  Allergies  Allergen Reactions  . Peanut-Containing Drug Products Anaphylaxis  . Lactose Intolerance (Gi) Diarrhea and Nausea And Vomiting    Current Medications:  Prior to Admission medications   Medication Sig Start Date End Date Taking? Authorizing Provider  carbamazepine (TEGRETOL) 200 MG tablet Take 200 mg by mouth 3 (three) times daily.   Yes Historical Provider, MD  PARoxetine (PAXIL) 20 MG tablet Take 20 mg by mouth every morning.   Yes Historical Provider, MD    Social History:    reports that he has been smoking Cigarettes.  He has a 2.5 pack-year smoking history. He has never used smokeless tobacco. He reports that he drinks about 3.6 ounces of alcohol per week. He reports that he uses illicit drugs (Marijuana).   Family History:    Family History  Problem Relation Age of Onset  . Depression Mother     Mental Status Examination/Evaluation: Psychiatric  Specialty Exam: @PHYSEXAMBYAGE2 @  @ROS @  Blood pressure 117/74, pulse 80, temperature 97.8 F (36.6 C), temperature source Oral, resp. rate 20, SpO2 98.00%.There is no weight on file to calculate BMI.  General Appearance: Casual  Eye Contact::  Poor  Speech:  Clear and Coherent and Normal Rate  Volume:  Normal  Mood:  Anxious and Depressed  Affect:  Congruent and Depressed  Thought Process:  Coherent  Orientation:  Full (Time, Place, and Person)  Thought Content:  Hallucinations: Auditory Command:  HIT HIS HEAD ON THE WALL  Suicidal Thoughts:  No  Homicidal Thoughts:  No  Memory:  Immediate;   Fair Recent;   Fair Remote;   Fair  Judgement:  Poor  Insight:  Shallow  Psychomotor Activity:  Normal  Concentration:  Fair  Recall:  NA  Akathisia:  NA  Handed:  Right  AIMS (if indicated):     Assets:  Desire for Improvement  Sleep:          DIAGNOSIS:   AXIS I   Schizoaffective d/o  AXIS II  Deffered  AXIS III See medical notes.  AXIS IV housing problems, occupational problems, other psychosocial or environmental problems and problems related to social environment  AXIS V 21-30 behavior considerably influenced by delusions or hallucinations OR serious impairment in judgment, communication OR inability to function in almost all areas     Assessment/Plan:  Consult and face to  face interview with Dr Ladona Ridgel Patient is accepted for inpatient psychiatric admission for safety and stabilization We will resume his home antipsychotic medications.

## 2013-05-22 NOTE — Consult Note (Signed)
Note agreed with after review

## 2013-05-22 NOTE — ED Notes (Signed)
Pt up to shower

## 2013-05-22 NOTE — Consult Note (Signed)
Note reviewed and agreed with  

## 2013-05-22 NOTE — ED Provider Notes (Signed)
9:35 PM Patient accepted to [facility]. Dr. Manfred Arch is the accepting physician.  Audree Camel, MD 05/22/13 2135

## 2013-05-22 NOTE — ED Notes (Signed)
Pt aaox3 sitting up in bed.  Pt deneis Si at this time.  Pt reports hearing "someone howling."  Pt denies voices are commanding and pt denies VH.  Pt laughs loud to himself.

## 2013-05-23 ENCOUNTER — Encounter (HOSPITAL_COMMUNITY): Payer: Self-pay | Admitting: Psychiatry

## 2013-05-23 DIAGNOSIS — F912 Conduct disorder, adolescent-onset type: Secondary | ICD-10-CM

## 2013-05-23 DIAGNOSIS — F3130 Bipolar disorder, current episode depressed, mild or moderate severity, unspecified: Secondary | ICD-10-CM | POA: Diagnosis present

## 2013-05-23 DIAGNOSIS — F1210 Cannabis abuse, uncomplicated: Secondary | ICD-10-CM

## 2013-05-23 DIAGNOSIS — F314 Bipolar disorder, current episode depressed, severe, without psychotic features: Principal | ICD-10-CM

## 2013-05-23 DIAGNOSIS — F259 Schizoaffective disorder, unspecified: Secondary | ICD-10-CM | POA: Diagnosis present

## 2013-05-23 MED ORDER — NICOTINE 21 MG/24HR TD PT24
21.0000 mg | MEDICATED_PATCH | Freq: Every day | TRANSDERMAL | Status: DC
  Administered 2013-05-23 – 2013-05-26 (×3): 21 mg via TRANSDERMAL
  Filled 2013-05-23 (×7): qty 1

## 2013-05-23 MED ORDER — TRAZODONE HCL 100 MG PO TABS
100.0000 mg | ORAL_TABLET | Freq: Every day | ORAL | Status: DC
  Administered 2013-05-23 – 2013-05-25 (×3): 100 mg via ORAL
  Filled 2013-05-23 (×5): qty 1

## 2013-05-23 MED ORDER — ARIPIPRAZOLE 5 MG PO TABS
5.0000 mg | ORAL_TABLET | Freq: Every day | ORAL | Status: DC
  Administered 2013-05-23 – 2013-05-25 (×3): 5 mg via ORAL
  Filled 2013-05-23 (×5): qty 1

## 2013-05-23 MED ORDER — LAMOTRIGINE 100 MG PO TABS
100.0000 mg | ORAL_TABLET | Freq: Every day | ORAL | Status: DC
  Administered 2013-05-23 – 2013-06-01 (×10): 100 mg via ORAL
  Filled 2013-05-23 (×12): qty 1

## 2013-05-23 NOTE — [facility] Group Notes (Signed)
[facility] Group Notes:  (Clinical Social Work)  05/23/2013  11:15-11:45AM  Summary of Progress/Problems:   The main focus of today's process group was for the patient to identify ways in which they have in the past sabotaged their own recovery and reasons they may have done this/what they received from doing it.  We then worked to identify a specific plan to avoid doing this when discharged from the hospital for this admission.  The patient expressed that he came back to the hospital because his medications stopped working and he stopped taking them.  He plans to use a pillbox, which he used successfully in the past.  Type of Therapy:  Group Therapy - Process  Participation Level:  Active  Participation Quality:  Attentive and Sharing  Affect:  Blunted and Depressed  Cognitive:  Alert and Appropriate  Insight:  Developing/Improving  Engagement in Therapy:  Developing/Improving  Modes of Intervention:  Clarification, Education, Exploration, Discussion  Ambrose Mantle, [name] 05/23/2013, 1:19 PM

## 2013-05-23 NOTE — Progress Notes (Signed)
D) Pt. Continues to hear the dogs howling, but is not hearing 'words' telling him to hurt himself. States "I slept well last night" Stated that "when I did hear the voices they were telling me to hit myself in the head with my shoe". Presently denies active SI or HI.  A) Given support and reassurance along with praise. Assured of his safety. R) Denies SI and HI. Auditory hallucinations decreasing decreasing in substance and only noises are being heard presently

## 2013-05-23 NOTE — [facility] Suicide Risk Assessment (Signed)
Suicide Risk Assessment  Admission Assessment     Nursing information obtained from:    Demographic factors:    Current Mental Status:    Loss Factors:    Historical Factors:    Risk Reduction Factors:     CLINICAL FACTORS:   Bipolar Disorder:   Depressive phase  COGNITIVE FEATURES THAT CONTRIBUTE TO RISK:  Closed-mindedness    SUICIDE RISK:   Severe:  Frequent, intense, and enduring suicidal ideation, specific plan, no subjective intent, but some objective markers of intent (i.e., choice of lethal method), the method is accessible, some limited preparatory behavior, evidence of impaired self-control, severe dysphoria/symptomatology, multiple risk factors present, and few if any protective factors, particularly a lack of social support.  PLAN OF CARE:  I certify that inpatient services furnished can reasonably be expected to improve the patient's condition.  Ancil Linsey 05/23/2013, 3:40 PM

## 2013-05-23 NOTE — Progress Notes (Signed)
Patient ID: Ryan Bartlett, male   DOB: 06-03-1996, 17 y.o.   MRN: [id number] D. The patient has a depressed mood and affect. Although present in the dayroom all evening, minimal interaction in the milieu. No auditory hallucinations at present, but states he hears howling at night. Admits to still having some passive suicidal ideation without a plan. A. Encouraged patient to attend evening wrap up group. Met with patient 1:1 to assess. Reviewed and administered HS medication. R. The patient attended evening group and was able to identify his mother and sister as his support system. His goal for tomorrow is to work on controlling his anger. He knows part of his recovery plan means taking his medication.  He stated he was happy that he was back on his medication. The patient is able to verbally contract for safety. Denied any thoughts to hurt others. Compliant with medication.
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
## 2013-05-23 NOTE — H&P (Signed)
NP not was reviewed, and I concur with above note.

## 2013-05-23 NOTE — Progress Notes (Signed)
[facility] Group Notes:  (Nursing/MHT/Case Management/Adjunct)  Date:  05/23/2013  Time:  2000  Type of Therapy:  Psychoeducational Skills  Participation Level:  Minimal  Participation Quality:  Inattentive  Affect:  Flat  Cognitive:  Appropriate  Insight:  Good  Engagement in Group:  Distracting  Modes of Intervention:  Education  Summary of Progress/Problems: The patient verbalized that his day went well, but offered few details. He stated that going outside for fresh air was very positive and that the doctor placed him on new medication. His support system consists of his mother and another relative. His goal for tomorrow is to not get angry.   Hazle Coca S 05/23/2013, 9:27 PM

## 2013-05-23 NOTE — [facility] Counselor (Signed)
Adult Comprehensive Assessment  Patient ID: Ryan Bartlett, male   DOB: September 04, 1995, 17 y.o.   MRN: [id number]  Information Source: Information source: Patient  Current Stressors:  Educational / Learning stressors: Denies Employment / Job issues: Denies Family Relationships: Denies Surveyor, quantity / Lack of resources (include bankruptcy): Denies Housing / Lack of housing: Is staying with sister, cannot find another place to stay but wants his own place Physical health (include injuries & life threatening diseases): Denies Social relationships: Denies Substance abuse: Denies Bereavement / Loss: Denies  Living/Environment/Situation:  Living Arrangements:  (Sister, her 2 sons) Living conditions (as described by patient or guardian): Sleeps on the couch  How long has patient lived in current situation?: 1 week, was in jail before that 1 month, before that was staying with mother What is atmosphere in current home: Temporary;Supportive;Loving;Comfortable  Family History:  Marital status: Single Does patient have children?: No  Childhood History:  By whom was/is the patient raised?: Mother Description of patient's relationship with caregiver when they were a child: Good relationship with mother growing up Patient's description of current relationship with people who raised him/her: Good relationship with mother but when he got out of jail, they had a disagreement and he left.  He cannot go back there now. Does patient have siblings?: Yes Number of Siblings: 4 Description of patient's current relationship with siblings: Is staying with older sister and they get along very well.  Gets along with one other sister "randomly", does not get along with one sister at all, gets along with his brother all the time. Did patient suffer any verbal/emotional/physical/sexual abuse as a child?: No Did patient suffer from severe childhood neglect?: No Has patient ever been sexually abused/assaulted/raped as  an adolescent or adult?: No Was the patient ever a victim of a crime or a disaster?: No Witnessed domestic violence?: No Has patient been effected by domestic violence as an adult?: No  Education:  Highest grade of school patient has completed: 11th Currently a student?: No Learning disability?: Yes What learning problems does patient have?: ADHD  Employment/Work Situation:   Employment situation: Unemployed What is the longest time patient has a held a job?: Has never held a job Where was the patient employed at that time?: NA Has patient ever been in the [employer]?: No Has patient ever served in Buyer, retail?: No  Financial Resources:   Financial resources: No income;Medicaid Does patient have a Lawyer or guardian?: No  Alcohol/Substance Abuse:   What has been your use of drugs/alcohol within the last 12 months?: Alcohol "not that often, maybe once a week" - does not get drunk.  Marijuana daily, 1 joint. If attempted suicide, did drugs/alcohol play a role in this?: No Alcohol/Substance Abuse Treatment Hx: Denies past history Has alcohol/substance abuse ever caused legal problems?: No  Social Support System:   Patient's Community Support System: Good Describe Community Support System: Family Type of faith/religion: None How does patient's faith help to cope with current illness?: NA  Leisure/Recreation:   Leisure and Hobbies: Skate  Strengths/Needs:   What things does the patient do well?: Animals, little kids In what areas does patient struggle / problems for patient: Does not know  Discharge Plan:   Does patient have access to transportation?: No Plan for no access to transportation at discharge: Needs a bus pass Will patient be returning to same living situation after discharge?: Yes Currently receiving community mental health services: Yes (From Whom) ([facility]) If no, would patient like referral  for services when discharged?: No (Wants to return  to [facility]) Does patient have financial barriers related to discharge medications?: Yes Patient description of barriers related to discharge medications: Has Medicaid, but no income  Summary/Recommendations:     This is a 17yo African American male with SI with a plan to kill himself by cutting his wrist or by hitting himself in the head with his boot. Patient reports that he has to kill himself because the howling dogs are telling him to do it. Patient reports that he can, "talk to and understand what all dogs are saying", has been able to understand dogs since he was 17 years old.. Patient reports that he has not been taking his medication for one week. Patient reports that he is not able to contract for safety.  Patient reports a prior history of psychiatric hospitalizations. 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 Patient reports a history of Bipolar Disorder, Schizophrenia, Oppositional Defiant Disorder, ADHD, and Depression. Patient reports a history of cutting and biting himself. Patient reports that he began biting himself when he was 17 years old. Patient reports that he bites himself daily. Patient reports that he began cutting himself when he was 17 years old. Patient reports that he cuts himself in the wrist daily.   He would benefit from safety monitoring, medication evaluation, psychoeducation, group therapy, and discharge planning to link with ongoing resources.    Sarina Ser. 05/23/2013

## 2013-05-24 MED ORDER — HYDROXYZINE HCL 50 MG PO TABS
50.0000 mg | ORAL_TABLET | Freq: Four times a day (QID) | ORAL | Status: DC | PRN
  Administered 2013-05-24 – 2013-05-30 (×2): 50 mg via ORAL
  Filled 2013-05-24 (×2): qty 1
  Filled 2013-05-24: qty 30

## 2013-05-24 NOTE — Progress Notes (Signed)
D Pt is seen OOB UAL on the adult unit...tolelated fair. HE is appropriate with this Clinical research associate. He takes his AM meds as ordered and he    Circulates out ion the hall, in between groups, tolerated well.    A He reports he still cont to hear " howling" at night. He has reulated his emotions and behavior today and there have been no periods of escalation.    R Safety is in place and poc moves forward

## 2013-05-24 NOTE — Progress Notes (Signed)
Patient ID: Ryan Bartlett, male   DOB: [birth year], 17 y.o.   MRN: [id number] D. The patient is pleasant and interacting appropriately in the milieu. He still has a depressed mood and affect. Stated he has been feeling more anxious this evening. He reports he has not been sleeping well due to his roommates poor hygiene. Requested to change rooms or sleep in the quiet room. He reports he is still hearing the howling at night and that it wakes him up.  A. Met with patient to assess. Encouraged to attend evening group. Medicated with PRN medication for anxiety. Administered HS medication for sleep. R. The patient denies any suicidal ideation at present and is able to verbally contract for safety. Attended and actively participated in evening wrap up group.  Compliant with medication.

## 2013-05-24 NOTE — [facility] Group Notes (Signed)
[facility] Group Notes:  (Clinical Social Work)  05/24/2013   11:15am-12:00pm  Summary of Progress/Problems:  The main focus of today's process group was to listen to a variety of genres of music and to identify that different types of music provoke different responses.  The patient then was able to identify personally what was soothing for them, as well as energizing.  Handouts were used to record feelings evoked, as well as how patient can personally use this knowledge in sleep habits, with depression, and with other symptoms.  The patient expressed understanding of concepts, as well as knowledge of how each type of music affected them and how this can be used when they are at home as a tool in their recovery.  Type of Therapy:  Music Therapy   Participation Level:  Active  Participation Quality:  Attentive and Sharing  Affect:  Blunted  Cognitive:  Oriented  Insight:  Engaged  Engagement in Therapy:  Engaged  Modes of Intervention:   Activity, Exploration  Ambrose Mantle, [name] 05/24/2013, 12:45 PM

## 2013-05-24 NOTE — Progress Notes (Signed)
[facility] Group Notes:  (Nursing/MHT/Case Management/Adjunct)  Date:  05/24/2013  Time:  2000  Type of Therapy:  Psychoeducational Skills  Participation Level:  Minimal  Participation Quality:  Attentive  Affect:  Blunted  Cognitive:  Appropriate  Insight:  Lacking  Engagement in Group:  Lacking  Modes of Intervention:  Education  Summary of Progress/Problems: The patient had little to share with the group this evening. He would only share that he attended groups today. His goal for tomorrow is to work on his temper. He states that he copes with his anger by returning to his bedroom until he feels calm.   Dwan Fennel S 05/24/2013, 10:30 PM

## 2013-05-24 NOTE — Progress Notes (Signed)
[facility] MD Progress Note  05/24/2013 3:57 PM Ryan Bartlett  MRN:  [id number] Subjective:  "I can't sleep" Pt reports having difficulty sleeping. He reports hearing howling. Pt had a verbal altercation with a peer last night over a pillow, and started to escalate. Mood is angry and depressed. He is tolerating meds and is med adherent. He denies SI/HI/VH. Diagnosis:   DSM5: Bipolar I Disorder  ADL's:  Intact  Sleep: Poor  Appetite:  Fair  Suicidal Ideation:  denies Homicidal Ideation:  denies AEB (as evidenced by):  Psychiatric Specialty Exam: ROS  Blood pressure 123/84, pulse 91, temperature 97.8 F (36.6 C), temperature source Oral, resp. rate 16, height 5\' 8"  (1.727 m), weight 83.915 kg (185 lb).Body mass index is 28.14 kg/(m^2).  General Appearance: Guarded  Eye Contact::  Minimal  Speech:  Slow  Volume:  Normal  Mood:  Depressed  Affect:  Congruent  Thought Process:  Goal Directed  Orientation:  Full (Time, Place, and Person)  Thought Content:  Hallucinations: Auditory and Paranoid Ideation  Suicidal Thoughts:  No  Homicidal Thoughts:  No  Memory:  Negative  Judgement:  Poor  Insight:  Lacking  Psychomotor Activity:  Normal  Concentration:  Fair  Recall:  Fair  Akathisia:  No  Handed:  Right  AIMS (if indicated):     Assets:  Desire for Improvement  Sleep:  Number of Hours: 6.5   Current Medications: Current Facility-Administered Medications  Medication Dose Route Frequency Provider Last Rate Last Dose  . acetaminophen (TYLENOL) tablet 650 mg  650 mg Oral Q6H PRN Earney Navy, NP   650 mg at 05/24/13 0865  . alum & mag hydroxide-simeth (MAALOX/MYLANTA) 200-200-20 MG/5ML suspension 30 mL  30 mL Oral Q4H PRN Earney Navy, NP      . ARIPiprazole (ABILIFY) tablet 5 mg  5 mg Oral Daily Nanine Means, NP   5 mg at 05/24/13 0759  . lamoTRIgine (LAMICTAL) tablet 100 mg  100 mg Oral Daily Nanine Means, NP   100 mg at 05/24/13 0759  . magnesium hydroxide  (MILK OF MAGNESIA) suspension 30 mL  30 mL Oral Daily PRN Earney Navy, NP      . nicotine (NICODERM CQ - dosed in mg/24 hours) patch 21 mg  21 mg Transdermal Daily Mojeed Akintayo   21 mg at 05/24/13 0800  . traZODone (DESYREL) tablet 100 mg  100 mg Oral QHS Nanine Means, NP   100 mg at 05/23/13 2154    Lab Results: No results found for this or any previous visit (from the past 48 hour(s)).  Physical Findings: AIMS: Facial and Oral Movements Muscles of Facial Expression: None, normal Lips and Perioral Area: None, normal Jaw: None, normal Tongue: None, normal,Extremity Movements Upper (arms, wrists, hands, fingers): None, normal Lower (legs, knees, ankles, toes): None, normal, Trunk Movements Neck, shoulders, hips: None, normal, Overall Severity Severity of abnormal movements (highest score from questions above): None, normal Incapacitation due to abnormal movements: None, normal Patient's awareness of abnormal movements (rate only patient's report): No Awareness, Dental Status Current problems with teeth and/or dentures?: No Does patient usually wear dentures?: No  CIWA:    COWS:     Treatment Plan Summary: Daily contact with patient to assess and evaluate symptoms and progress in treatment Medication management  Plan: Cont to observe for response to current meds. Will add vistaril PRN anxiety/insomnia.  Medical Decision Making Problem Points:  Established problem, worsening (2) Data Points:  Review of medication regiment &  side effects (2)  I certify that inpatient services furnished can reasonably be expected to improve the patient's condition.   Ancil Linsey 05/24/2013, 3:57 PM

## 2013-05-25 DIAGNOSIS — F319 Bipolar disorder, unspecified: Secondary | ICD-10-CM

## 2013-05-25 MED ORDER — ARIPIPRAZOLE 10 MG PO TABS
10.0000 mg | ORAL_TABLET | Freq: Every day | ORAL | Status: DC
  Administered 2013-05-26: 10 mg via ORAL
  Filled 2013-05-25 (×2): qty 1

## 2013-05-25 MED ORDER — ARIPIPRAZOLE 5 MG PO TABS
5.0000 mg | ORAL_TABLET | Freq: Once | ORAL | Status: AC
  Administered 2013-05-25: 5 mg via ORAL
  Filled 2013-05-25: qty 1

## 2013-05-25 NOTE — [facility] Group Notes (Signed)
[facility] [name] Group Therapy  05/25/2013 1:15 pm  Type of Therapy: Process Group Therapy  Participation Level:  Minimal  Participation Quality:  Appropriate  Affect:  Flat  Cognitive:  Oriented  Insight:  None  Engagement in Group:  Limited  Engagement in Therapy:  None  Modes of Intervention:  Activity, Clarification, Education, Problem-solving and Support  Summary of Progress/Problems: Today's group addressed the issue of overcoming obstacles.  Patients were asked to identify their biggest obstacle post d/c that stands in the way of their on-going success, and then problem solve as to how to manage this.  Ryan Bartlett identified his temper as his biggest challenge.  He states his way of coping is by spending time with his dog.  The dog has a calming effect.  Limited insight.  Ryan Bartlett 05/25/2013   2:48 PM

## 2013-05-25 NOTE — Tx Team (Signed)
  Interdisciplinary Treatment Plan Update   Date Reviewed:  05/25/2013  Time Reviewed:  8:16 AM  Progress in Treatment:   Attending groups: No Participating in groups: No Taking medication as prescribed: Yes  Tolerating medication: Yes Family/Significant other contact made: No Patient understands diagnosis: No  Limited insight Discussing patient identified problems/goals with staff: Yes  See initial care plan Medical problems stabilized or resolved: Yes Denies suicidal/homicidal ideation: Yes  In tx team Patient has not harmed self or others: Yes  For review of initial/current patient goals, please see plan of care.  Estimated Length of Stay:  4-5 days  Reason for Continuation of Hospitalization: Delusions  Hallucinations Medication stabilization  New Problems/Goals identified:  N/A  Discharge Plan or Barriers:   return home, follow up with ACT team  Additional Comments:  17 year old male patient admitted on voluntary basis. On admission, pt reports that he was brought in by the police and the reason being was that he told a friend that he was having auditory hallucinations to hit himself in the head with a boot. This person then called the police and was subsequently brought to the hospital. Pt does endorse auditory hallucinations and feels his medications that he is prescribed are not working to fix this problem and wants to find a medication that does. Pt endorses marijuana usage and states he self medicates as it helps with the voices. Pt reports that he lives with his sister and plans to discharge there. Pt does report that he has an ACT team that follows him. Pt does endorse depression and suicidal thoughts but is able to contract for safety while on the unit. Pt reports that he was last hospitalized at [facility] during the summer   Attendees:  Signature: Thedore Mins, MD 05/25/2013 8:16 AM   Signature: Richelle Ito, [name] 05/25/2013 8:16 AM  Signature: Fransisca Kaufmann, NP  05/25/2013 8:16 AM  Signature: Joslyn Devon, RN 05/25/2013 8:16 AM  Signature: Liborio Nixon, RN 05/25/2013 8:16 AM  Signature:  05/25/2013 8:16 AM  Signature:   05/25/2013 8:16 AM  Signature:    Signature:    Signature:    Signature:    Signature:    Signature:      Scribe for Treatment Team:   [employer], [name]  05/25/2013 8:16 AM

## 2013-05-25 NOTE — Progress Notes (Signed)
Patient ID: Ryan Bartlett, male   DOB: [birth year], 17 y.o.   MRN: [id number] D:Patient presents with auditory hallucinations.  Patient reports "I hear the wolves howling constantly.  When is it a full moon because they will get louder."  Patient has been sitting in day room watching TV and taking short naps.  He has been attending groups with minimal participation.  Patient has little insight regarding his diagnosis.  He reports that he has passive SI, however, contracts verbally for safety.  He denies any HI.  Endorses AH.  Patient has minimal interaction with staff and others.  A:Continue to monitor medication management and MD orders.  Safety checks completed every 15 minutes per protocol.  R:Patient's behavior has been appropriate.

## 2013-05-25 NOTE — Progress Notes (Signed)
D  Pt. Reports a good day but states he continues to hear howling in his head.  Pt. Still has passive SI .  A Writer offered support. Gave pt. A journal to write down his thoughts and things that he may want to discuss with his MD.  Encouraged pt. To come to staff if he was having SI thoughts.  R Pt. Agreed to come to staff.  Accepted the journal because he wants to discuss the howling noises with his MD.  Pt was calm and cooperative and is presently resting quietly.

## 2013-05-25 NOTE — Progress Notes (Signed)
Recreation Therapy Notes  Date: 10.06.2014 Time: 9:30am Location: [address] Dayroom  Group Topic: Memory/Cognition  Goal Area(s) Addresses:  Patient will verbalize understanding of importance of working on memory/cognition. Patient will successfully repeat sequence performed by LRT.   Behavioral Response: Did not attend.  [name] Levelle Edelen, LRT/CTRS  Jennfer Gassen L 05/25/2013 11:59 AM

## 2013-05-25 NOTE — Progress Notes (Signed)
Adult Psychoeducational Group Note  Date:  05/25/2013 Time:  11:37 AM  Group Topic/Focus:  Self Care:   The focus of this group is to help patients understand the importance of self-care in order to improve or restore emotional, physical, spiritual, interpersonal, and financial health.  Participation Level:  Minimal  Participation Quality:  Appropriate  Affect:  Appropriate  Cognitive:  Alert  Insight: Limited  Engagement in Group:  Limited  Modes of Intervention:  Discussion  Additional Comments:  Pt. Shared how he has taken care of himself in the past such as playing with his dog and little brother. Group discussed areas of self-care that can be improved.   Ruta Hinds [facility] 05/25/2013, 11:37 AM

## 2013-05-25 NOTE — [facility] Group Notes (Signed)
[facility] [name] Aftercare Discharge Planning Group Note   05/25/2013 8:16 AM  Participation Quality:  Did not attend    [place]

## 2013-05-25 NOTE — Progress Notes (Signed)
Patient ID: Ryan Bartlett, male   DOB: April 03, 1996, 17 y.o.   MRN: [id number] [facility] Ryan Bartlett Progress Note  05/25/2013 10:39 AM Ryan Bartlett  MRN:  [id number] Subjective:   Patient states "I'm tired of not being able to sleep. It's hard because I hear the wolves howling. In my head I am a wolf. I was raised around a lot of dogs. It gets worse when a full moon is approaching. I want some relief from this. I don't think the howling has improved since I came here. I was taken off seroquel because I was abusing it and taking it during the day to sleep."   Objective:  Patient observed lying in bed during the morning hours. Staff report that patient is acting less agitated on the unit. He is medication compliant and is working on developing better coping skills to manage his anger. The patient however remains psychotic and delusional at this time.   Diagnosis:   DSM5: Bipolar I Disorder  ADL's:  Intact  Sleep: Fair,  Patient charted as sleeping 6.5 hours but continues to report he is not getting any sleep at night.   Appetite:  Fair  Suicidal Ideation:  denies Homicidal Ideation:  denies AEB (as evidenced by):  Psychiatric Specialty Exam: Review of Systems  Constitutional: Negative.   HENT: Negative.   Eyes: Negative.   Respiratory: Negative.   Cardiovascular: Negative.   Gastrointestinal: Negative.   Genitourinary: Negative.   Musculoskeletal: Negative.   Skin: Negative.   Neurological: Negative.   Endo/Heme/Allergies: Negative.   Psychiatric/Behavioral: Positive for depression and hallucinations. Negative for suicidal ideas, memory loss and substance abuse. The patient has insomnia. The patient is not nervous/anxious.     Blood pressure 94/58, pulse 80, temperature 97.3 F (36.3 C), temperature source Oral, resp. rate 16, height 5\' 8"  (1.727 m), weight 83.915 kg (185 lb).Body mass index is 28.14 kg/(m^2).  General Appearance: Guarded  Eye Contact::  Minimal  Speech:  Slow   Volume:  Normal  Mood:  Depressed  Affect:  Congruent  Thought Process:  Goal Directed  Orientation:  Full (Time, Place, and Person)  Thought Content:  Hallucinations: Auditory and Paranoid Ideation, Delusions  Suicidal Thoughts:  No  Homicidal Thoughts:  No  Memory:  Negative  Judgement:  Poor  Insight:  Lacking  Psychomotor Activity:  Normal  Concentration:  Fair  Recall:  Fair  Akathisia:  No  Handed:  Right  AIMS (if indicated):     Assets:  Desire for Improvement  Sleep:  Number of Hours: 6.5   Current Medications: Current Facility-Administered Medications  Medication Dose Route Frequency Provider Last Rate Last Dose  . acetaminophen (TYLENOL) tablet 650 mg  650 mg Oral Q6H PRN Earney Navy, NP   650 mg at 05/24/13 7829  . alum & mag hydroxide-simeth (MAALOX/MYLANTA) 200-200-20 MG/5ML suspension 30 mL  30 mL Oral Q4H PRN Earney Navy, NP      . ARIPiprazole (ABILIFY) tablet 5 mg  5 mg Oral Daily Nanine Means, NP   5 mg at 05/25/13 5621  . hydrOXYzine (ATARAX/VISTARIL) tablet 50 mg  50 mg Oral Q6H PRN Caprice Kluver, Ryan Bartlett   50 mg at 05/24/13 2120  . lamoTRIgine (LAMICTAL) tablet 100 mg  100 mg Oral Daily Nanine Means, NP   100 mg at 05/25/13 3086  . magnesium hydroxide (MILK OF MAGNESIA) suspension 30 mL  30 mL Oral Daily PRN Earney Navy, NP      . nicotine (  NICODERM CQ - dosed in mg/24 hours) patch 21 mg  21 mg Transdermal Daily Mojeed Akintayo   21 mg at 05/24/13 0800  . traZODone (DESYREL) tablet 100 mg  100 mg Oral QHS Nanine Means, NP   100 mg at 05/24/13 2118    Lab Results: No results found for this or any previous visit (from the past 48 hour(s)).  Physical Findings: AIMS: Facial and Oral Movements Muscles of Facial Expression: None, normal Lips and Perioral Area: None, normal Jaw: None, normal Tongue: None, normal,Extremity Movements Upper (arms, wrists, hands, fingers): None, normal Lower (legs, knees, ankles, toes): None, normal, Trunk  Movements Neck, shoulders, hips: None, normal, Overall Severity Severity of abnormal movements (highest score from questions above): None, normal Incapacitation due to abnormal movements: None, normal Patient's awareness of abnormal movements (rate only patient's report): No Awareness, Dental Status Current problems with teeth and/or dentures?: No Does patient usually wear dentures?: No  CIWA:    COWS:     Treatment Plan Summary: Daily contact with patient to assess and evaluate symptoms and progress in treatment Medication management  Plan: Continue crisis management and stabilization.  Medication management: Reviewed with patient who stated no untoward effects. Increase Abilify to 10 mg daily for continued report of auditory hallucinations. Continue Lamictal 100 mg daily for improved mood stability. Continue Trazodone 100 mg at hs for improved quality of sleep.  Encouraged patient to attend groups and participate in group counseling sessions and activities.  Discharge plan in progress.  Continue current treatment plan.  Address health issues: Vitals reviewed and stable.   Medical Decision Making Problem Points:  Established problem, worsening (2) Data Points:  Review of medication regiment & side effects (2)  I certify that inpatient services furnished can reasonably be expected to improve the patient's condition.   Ryan Bartlett 05/25/2013, 10:39 AM  Reviewed note, agree with findings and plan.  Ryan Bartlett, M.D.  05/25/2013 3:21 PM

## 2013-05-26 MED ORDER — NICOTINE POLACRILEX 2 MG MT GUM
2.0000 mg | CHEWING_GUM | OROMUCOSAL | Status: DC | PRN
  Administered 2013-05-26 – 2013-06-01 (×12): 2 mg via ORAL
  Filled 2013-05-26 (×5): qty 1
  Filled 2013-05-26: qty 30
  Filled 2013-05-26 (×2): qty 1

## 2013-05-26 MED ORDER — ZIPRASIDONE HCL 20 MG PO CAPS
20.0000 mg | ORAL_CAPSULE | Freq: Every day | ORAL | Status: DC
  Administered 2013-05-26: 20 mg via ORAL
  Filled 2013-05-26 (×2): qty 1

## 2013-05-26 MED ORDER — TRAZODONE HCL 100 MG PO TABS
100.0000 mg | ORAL_TABLET | Freq: Every evening | ORAL | Status: DC | PRN
  Administered 2013-05-26 – 2013-05-27 (×2): 100 mg via ORAL
  Filled 2013-05-26 (×2): qty 1

## 2013-05-26 NOTE — Progress Notes (Signed)
Patient ID: Ryan Bartlett, male   DOB: 1995-12-19, 17 y.o.   MRN: [id number] Pt visible in the milieu.  Interacting appropriately with staff and peers.  Needs assessed.  Pt reported he has not been sleeping well and requested medication for insomnia at bedtime.  Denied additional needs.  Support and encouragement given.  Fifteen minute checks in progress. Pt safe on unit.

## 2013-05-26 NOTE — Progress Notes (Signed)
Patient ID: Ryan Bartlett, male   DOB: [birth year], 17 y.o.   MRN: [id number] D:Patient presents with stable, neutral mood this morning.  States that he did not sleep well.  He stated, "they gave me trazodone, but it didn't help any."  Patient was advised to inform MD regarding his sleeplessness.  Patient reports that he continues to hear howling.  He stated, "It's not like regular voices.  This is very different.  The howling is very loud."  Patient denied SI, however, wrote that he is having passive thoughts off and on.  Patient is interacting with staff and others on the milieu.  Patient denies HI.  Patient is attending groups with minimal participation.  A:Continue to monitor medication management and MD orders.  Safety checks completed every 15 minutes per protocol.  R:Patient's behavior is appropriate.

## 2013-05-26 NOTE — [facility] Group Notes (Signed)
Adult Psychoeducational Group Note  Date:  05/26/2013 Time:  8:35 PM  Group Topic/Focus:  Wrap-Up Group:   The focus of this group is to help patients review their daily goal of treatment and discuss progress on daily workbooks.  Participation Level:  Active  Participation Quality:  Appropriate  Affect:  Appropriate  Cognitive:  Appropriate  Insight: Appropriate  Engagement in Group:  Engaged  Modes of Intervention:  Discussion  Additional Comments:  Paige stated that he had an all right day.  He had a few outbursts today because other patients made some snide remarks towards him.  He also stated he was able to get some rest because he couldn't go outside because of his outbursts and because he doesn't sleep at night.  He said he did not reach his goal of controlling his anger so he will try again tomorrow.  Caroll Rancher A 05/26/2013, 8:35 PM

## 2013-05-26 NOTE — Progress Notes (Signed)
[facility] MD Progress Note  05/26/2013 11:49 AM EMMAUEL HALLUMS  MRN:  [id number] Subjective: Mr. Schorr is a 17 y/o male with pa past psychiatric history significant for ODD, Bipolar I Disorder, and  ADHD.  The patient reports that he has believed he was a wolf since the age of 16 or earlier. The patient add that he enjoyed the [employer] and [employer], when he was 15 and this "intesified feelings about being a wolf."  He admits to believing that when he dies he will become a wolf, but no specific belief that he would have to commit suicide to become a wolf.  He reports he had been on Abilify at higher doses for years but still heard the howling of wolves.  The patient admits to continued suicidal ideation which started since the age of 16 with recent command type hallucinations telling him to hurt himself and how to do it. He reports sleep is poor despite taking trazodone. Unable to contact foster mother for collateral information.  Diagnosis:   DSM5: Schizophrenia Disorders:  Delusional Disorder (297.1)  Substance/Addictive Disorders:  Cannabis Use Disorder - Moderate 9304.30) Bipolar I disorder, severe, most recent episode depressed with psychotic features.  Rule out Schizoaffective Disorder   ADL's:  Intact  Sleep: Poor  Appetite:  Good  Suicidal Ideation:  Plan:  Yes Intent:  No Means:  Yes Homicidal Ideation:  Plan:  Patient denies Intent:  Patient denies Means:  Patient denies AEB (as evidenced by): Behavior in the unit.  Psychiatric Specialty Exam: Review of Systems  Constitutional: Negative for fever and chills.  Cardiovascular: Negative for chest pain, palpitations and orthopnea.  Gastrointestinal: Negative for heartburn, nausea, vomiting, abdominal pain, diarrhea and constipation.  Neurological: Negative for dizziness, seizures and loss of consciousness.    Blood pressure 144/74, pulse 94, temperature 97.3 F (36.3 C), temperature source Oral, resp. rate 18, height  5\' 8"  (1.727 m), weight 83.915 kg (185 lb).Body mass index is 28.14 kg/(m^2).  General Appearance: Casual and Fairly Groomed  Patent attorney::  Good  Speech:  Clear and Coherent and Normal Rate  Volume:  Normal  Mood:  Depressed  Affect:  Restricted  Thought Process:  Goal Directed  Orientation:  Full (Time, Place, and Person)  Thought Content:  Delusions and Hallucinations: Auditory  Suicidal Thoughts:  Yes.  without intent/plan  Homicidal Thoughts:  No  Memory:  Immediate;   Good Recent;   Good Remote;   Good  Judgement:  Impaired  Insight:  Shallow  Psychomotor Activity:  Normal  Concentration:  Good  Recall:  Fair  Akathisia:  No  Handed:  Left  AIMS (if indicated):   As noted in the chart  Assets:  Communication Skills Financial Resources/Insurance Housing Physical Health Social Support  Sleep:  Number of Hours: 5.5   Current Medications: Current Facility-Administered Medications  Medication Dose Route Frequency Provider Last Rate Last Dose  . acetaminophen (TYLENOL) tablet 650 mg  650 mg Oral Q6H PRN Earney Navy, NP   650 mg at 05/24/13 4098  . alum & mag hydroxide-simeth (MAALOX/MYLANTA) 200-200-20 MG/5ML suspension 30 mL  30 mL Oral Q4H PRN Earney Navy, NP      . ARIPiprazole (ABILIFY) tablet 10 mg  10 mg Oral Daily Fransisca Kaufmann, NP   10 mg at 05/26/13 0731  . hydrOXYzine (ATARAX/VISTARIL) tablet 50 mg  50 mg Oral Q6H PRN Caprice Kluver, MD   50 mg at 05/24/13 2120  . lamoTRIgine (LAMICTAL) tablet  100 mg  100 mg Oral Daily Nanine Means, NP   100 mg at 05/26/13 0731  . magnesium hydroxide (MILK OF MAGNESIA) suspension 30 mL  30 mL Oral Daily PRN Earney Navy, NP      . nicotine (NICODERM CQ - dosed in mg/24 hours) patch 21 mg  21 mg Transdermal Daily Mojeed Akintayo   21 mg at 05/26/13 0732  . traZODone (DESYREL) tablet 100 mg  100 mg Oral QHS Nanine Means, NP   100 mg at 05/25/13 2222    Lab Results: No results found for this or any previous visit  (from the past 48 hour(s)).  Physical Findings: AIMS: Facial and Oral Movements Muscles of Facial Expression: None, normal Lips and Perioral Area: None, normal Jaw: None, normal Tongue: None, normal,Extremity Movements Upper (arms, wrists, hands, fingers): None, normal Lower (legs, knees, ankles, toes): None, normal, Trunk Movements Neck, shoulders, hips: None, normal, Overall Severity Severity of abnormal movements (highest score from questions above): None, normal Incapacitation due to abnormal movements: None, normal Patient's awareness of abnormal movements (rate only patient's report): No Awareness, Dental Status Current problems with teeth and/or dentures?: No Does patient usually wear dentures?: No  CIWA:    COWS:     Treatment Plan Summary: Daily contact with patient to assess and evaluate symptoms and progress in treatment Medication management Continue to take part in groups.  Plan: 1. Continue to provide a safe, secure environment on the unit. 2. Patient will continue to attend groups.  3. Medication management as follows: A) Will discontinue Abilify-patient reports it did not help with auditory hallucinations in the past. B) Trial of Geodon with start with 20 mg With supper tonight and titrate by 20-40 mg daily, if tolerated to control symptoms.  C) Given past history of ODD, the validity of his auditory hallucinations should be considered.  D) Will consider lithium with this patient given suicidal ideation. E) Continue Lamictal 100 mg  F) Continue Hydroxyzine PRN G) Change trazodone to PRN  Medical Decision Making Problem Points:  Established problem, worsening (2) and Review of psycho-social stressors (1) Data Points:  Order Aims Assessment (2) Review or order medicine tests (1) Review and summation of old records (2) Review of medication regiment & side effects (2) Review of new medications or change in dosage (2)  I certify that inpatient services furnished  can reasonably be expected to improve the patient's condition.   Michille Mcelrath 05/26/2013, 11:49 AM

## 2013-05-26 NOTE — [facility] Group Notes (Signed)
[facility] [name] Group Therapy  05/26/2013 , 2:06 PM   Type of Therapy:  Group Therapy  Participation Level:  Active  Participation Quality:  Attentive  Affect:  Appropriate  Cognitive:  Alert  Insight:  Improving  Engagement in Therapy:  Engaged  Modes of Intervention:  Discussion, Exploration and Socialization  Summary of Progress/Problems: Today's group focused on the term Diagnosis.  Participants were asked to define the term, and then pronounce whether it is a negative, positive or neutral term.  "Diagnosis means learning how to overcome, and there is a learning curve."  Jaideep talked about his diagnosis of ADHD and his struggles with school work, and how that meant he learned it would take him longer to learn what other people were learning, but he needed to ask for help and work with a Engineer, technical sales, and he would get it eventually.  [name] 05/26/2013 , 2:06 PM

## 2013-05-27 DIAGNOSIS — F315 Bipolar disorder, current episode depressed, severe, with psychotic features: Secondary | ICD-10-CM

## 2013-05-27 DIAGNOSIS — F22 Delusional disorders: Secondary | ICD-10-CM

## 2013-05-27 DIAGNOSIS — F1220 Cannabis dependence, uncomplicated: Secondary | ICD-10-CM

## 2013-05-27 MED ORDER — HALOPERIDOL 5 MG PO TABS
5.0000 mg | ORAL_TABLET | Freq: Two times a day (BID) | ORAL | Status: DC
  Administered 2013-05-27 – 2013-05-31 (×8): 5 mg via ORAL
  Filled 2013-05-27 (×12): qty 1

## 2013-05-27 MED ORDER — BENZTROPINE MESYLATE 0.5 MG PO TABS
0.5000 mg | ORAL_TABLET | Freq: Two times a day (BID) | ORAL | Status: DC
  Administered 2013-05-27 – 2013-05-31 (×8): 0.5 mg via ORAL
  Filled 2013-05-27 (×13): qty 1

## 2013-05-27 NOTE — Progress Notes (Signed)
Patient ID: Ryan Bartlett, male   DOB: 01/02/1996, 17 y.o.   MRN: [id number] [facility] MD Progress Note  05/27/2013 1:40 PM Ryan Bartlett  MRN:  [id number] Subjective:  Patient states "The howling is worse at night and its makes it hard for me to sleep. I think there is a full moon so that is making it worse too. I feel like more of a wolfe than a person. I had an anger outburst too yesterday. I'm trying to control myself better. I don't like people telling me what to do."   Objective:  Patient observed engaging in milieu therapy. He is open to speaking with Clinical research associate. The patient remains delusional and psychotic at this time. Burhan is reporting that he is having psychotic symptoms during the day as well as at night.  Diagnosis:   DSM5: Schizophrenia Disorders:  Delusional Disorder (297.1)  Substance/Addictive Disorders:  Cannabis Use Disorder - Moderate 9304.30) Bipolar I disorder, severe, most recent episode depressed with psychotic features.  Rule out Schizoaffective Disorder   ADL's:  Intact  Sleep: Fair, documented as 5.25 hours  Appetite:  Good  Suicidal Ideation:  Plan:  Yes Intent:  No Means:  Yes Homicidal Ideation:  Plan:  Patient denies Intent:  Patient denies Means:  Patient denies AEB (as evidenced by): Behavior in the unit.  Psychiatric Specialty Exam: Review of Systems  Constitutional: Negative for fever and chills.  Respiratory: Negative.   Cardiovascular: Negative for chest pain, palpitations and orthopnea.  Gastrointestinal: Negative for heartburn, nausea, vomiting, abdominal pain, diarrhea and constipation.  Musculoskeletal: Negative.   Neurological: Negative for dizziness, seizures and loss of consciousness.  Psychiatric/Behavioral: Positive for depression and hallucinations. Negative for suicidal ideas, memory loss and substance abuse. The patient is nervous/anxious and has insomnia.     Blood pressure 127/80, pulse 77, temperature 97.4 F (36.3 C),  temperature source Oral, resp. rate 16, height 5\' 8"  (1.727 m), weight 83.915 kg (185 lb).Body mass index is 28.14 kg/(m^2).  General Appearance: Casual and Fairly Groomed  Patent attorney::  Good  Speech:  Clear and Coherent and Normal Rate  Volume:  Normal  Mood:  Depressed  Affect:  Restricted  Thought Process:  Goal Directed  Orientation:  Full (Time, Place, and Person)  Thought Content:  Delusions and Hallucinations: Auditory  Suicidal Thoughts:  Yes.  without intent/plan  Homicidal Thoughts:  No  Memory:  Immediate;   Good Recent;   Good Remote;   Good  Judgement:  Impaired  Insight:  Shallow  Psychomotor Activity:  Normal  Concentration:  Good  Recall:  Fair  Akathisia:  No  Handed:  Left  AIMS (if indicated):   As noted in the chart  Assets:  Communication Skills Financial Resources/Insurance Housing Physical Health Social Support  Sleep:  Number of Hours: 5.25   Current Medications: Current Facility-Administered Medications  Medication Dose Route Frequency Provider Last Rate Last Dose  . acetaminophen (TYLENOL) tablet 650 mg  650 mg Oral Q6H PRN Earney Navy, NP   650 mg at 05/27/13 0441  . alum & mag hydroxide-simeth (MAALOX/MYLANTA) 200-200-20 MG/5ML suspension 30 mL  30 mL Oral Q4H PRN Earney Navy, NP      . benztropine (COGENTIN) tablet 0.5 mg  0.5 mg Oral BID Fransisca Kaufmann, NP      . haloperidol (HALDOL) tablet 5 mg  5 mg Oral BID Fransisca Kaufmann, NP      . hydrOXYzine (ATARAX/VISTARIL) tablet 50 mg  50 mg Oral Q6H PRN  Caprice Kluver, MD   50 mg at 05/24/13 2120  . lamoTRIgine (LAMICTAL) tablet 100 mg  100 mg Oral Daily Nanine Means, NP   100 mg at 05/27/13 0735  . magnesium hydroxide (MILK OF MAGNESIA) suspension 30 mL  30 mL Oral Daily PRN Earney Navy, NP      . nicotine polacrilex (NICORETTE) gum 2 mg  2 mg Oral PRN Larena Sox, MD   2 mg at 05/27/13 0934  . traZODone (DESYREL) tablet 100 mg  100 mg Oral QHS PRN Larena Sox, MD   100 mg  at 05/26/13 2209    Lab Results: No results found for this or any previous visit (from the past 48 hour(s)).  Physical Findings: AIMS: Facial and Oral Movements Muscles of Facial Expression: None, normal Lips and Perioral Area: None, normal Jaw: None, normal Tongue: None, normal,Extremity Movements Upper (arms, wrists, hands, fingers): None, normal Lower (legs, knees, ankles, toes): None, normal, Trunk Movements Neck, shoulders, hips: None, normal, Overall Severity Severity of abnormal movements (highest score from questions above): None, normal Incapacitation due to abnormal movements: None, normal Patient's awareness of abnormal movements (rate only patient's report): No Awareness, Dental Status Current problems with teeth and/or dentures?: No Does patient usually wear dentures?: No  CIWA:    COWS:     Treatment Plan Summary: Daily contact with patient to assess and evaluate symptoms and progress in treatment Medication management Continue to take part in groups.  Plan: Continue crisis management and stabilization.  Medication management: Discontinue geodon. After consulting with Dr. Jannifer Franklin he feels Haldol 5 mg BID for psychosis is the best treatment choice. Give Cogentin 0.5 mg BID for EPS prevention. Continue Lamictal for mood stability and Trazodone for sleep.  Encouraged patient to attend groups and participate in group counseling sessions and activities.  Discharge plan in progress.  Continue current treatment plan.  Address health issues: Vitals reviewed and stable.   Medical Decision Making Problem Points:  Established problem, worsening (2) and Review of psycho-social stressors (1) Data Points:  Order Aims Assessment (2) Review or order medicine tests (1) Review and summation of old records (2) Review of medication regiment & side effects (2) Review of new medications or change in dosage (2)  I certify that inpatient services furnished can reasonably be expected  to improve the patient's condition.   Lam Mccubbins NP-C 05/27/2013, 1:40 PM

## 2013-05-27 NOTE — Progress Notes (Signed)
Patient ID: Ryan Bartlett, male   DOB: [birth year], 17 y.o.   MRN: [id number] D:Patient continues to have AH.  Patient reports that he is hearing wolves howling.  He does report that have decreased.  He also denies SI/HI/AVH.  He denies any passive SI.  He rates his depression 4/10; his hopelessness a 5/10.  Patient continues to improve with his treatment.  He agrees to have a better day today because of his previous agitation yesterday.  He presents with a bright affect his morning.  A:Continue to monitor medication management and MD orders.  Safety checks completed every 15 minutes per protocol.  R:Patient is interacting well with staff.

## 2013-05-27 NOTE — Progress Notes (Signed)
Recreation Therapy Notes  Date: 10.08.2014 Time: 9:30am Location: [address] Dayroom  Group Topic: Leisure Education  Goal Area(s) Addresses:  Patient will verbalize activity of interest by end of group session. Patient will verbalize the ability to use positive leisure/recreation as a coping mechanism.  Behavioral Response: Engaged, Attentive, Appropriate  Intervention: Game  Activity: Leisure ABC's. Patients were asked to identify at least one leisure activity per letter of the alphabet.   Education:  Leisure Education, Building control surveyor, Coping Skills  Education Outcome: Acknowledges understanding  Clinical Observations/Feedback: Patient actively engaged in group activity, identifying appropriate activities to go with letters of the alphabet.  Patietn related participation in leisure activities to building relationships, as well as being a form of stress relief.   Ryan Bartlett, LRT/CTRS  Lucyann Romano L 05/27/2013 12:15 PM

## 2013-05-27 NOTE — Progress Notes (Signed)
D Pt. Observed resting quietly in bed with his eyes closed.  RR even and unlabored. No signs of distress or discomfort noted.  A Q 15 min cks. Were done for safety.  R safety maintained on the unit

## 2013-05-27 NOTE — [facility] Group Notes (Signed)
[facility] Group Notes:  (Counselor/Nursing/MHT/Case Management/Adjunct)  05/27/2013 1:15PM  Type of Therapy:  Group Therapy  Participation Level:  Active  Participation Quality:  Appropriate  Affect:  Flat  Cognitive:  Oriented  Insight:  Improving  Engagement in Group:  Limited  Engagement in Therapy:  Limited  Modes of Intervention:  Discussion, Exploration and Socialization  Summary of Progress/Problems: The topic for group was balance in life.  Pt participated in the discussion about when their life was in balance and out of balance and how this feels.  Pt discussed ways to get back in balance and short term goals they can work on to get where they want to be. Edson states he gets unbalanced when he loses his temper and lashes out at others.  Shared that he got mad at a dog and kicked it.  Later felt bad about it.  Says he uses his grandmother for support.  Spent much of group sleeping, or at least resting his eyes with his head covered.   [name] 05/27/2013 1:26 PM

## 2013-05-27 NOTE — [facility] Group Notes (Signed)
[facility] [name] Aftercare Discharge Planning Group Note   05/27/2013 9:58 AM  Participation Quality:  Active  Mood/Affect:  Flat  Depression Rating:    Anxiety Rating:    Thoughts of Suicide:  No Will you contract for safety?  N/A  Current AVH:  No  Plan for Discharge/Comments:  Was very sleepy.  Ryan Bartlett indicated that he is having trouble sleeping at night and is usually sleeping during the daytime.  "I can't sleep at all."  Asked about calling his big brother with the CSW after group.    Transportation Means: unknown  Supports:  Unknown   Simona Huh

## 2013-05-27 NOTE — Progress Notes (Signed)
The focus of this group is to help patients review their daily goal of treatment and discuss progress on daily workbooks. Pt attended the evening group session and responded to discussion prompts from the Writer. Pt reported having had a good day on the unit, the highlight of which was being able to go outside to the courtyard and play basketball. Pt reported having no additional needs from Nursing Staff this evening. Pt required redirection several times in order to keep from interrupting his peers with joking comments. Pt's affect was bright and playful.

## 2013-05-27 NOTE — Progress Notes (Signed)
Patient ID: Ryan Bartlett, male   DOB: Jul 15, 1996, 17 y.o.   MRN: [id number] D: pt. Reports "doing better than yesterday, I got to go outside and I didn't curse nobody out today" "I meet my goal" " I still hear hollering in my head, like wolves" " I just need help with my temper" "My brother said I need to get myself together so I can go to [employer], he take out a lot of time with me" A: Clinical research associate introduced self to client and encouraged him by commending him for reaching today's goal. Writer encouraged group. Staff will monitor q62min for safety. R: Pt. Is safe on the unit and attended group.

## 2013-05-28 MED ORDER — TRAZODONE HCL 50 MG PO TABS
150.0000 mg | ORAL_TABLET | Freq: Every day | ORAL | Status: DC
  Administered 2013-05-28 – 2013-05-31 (×4): 150 mg via ORAL
  Filled 2013-05-28 (×4): qty 1
  Filled 2013-05-28: qty 3
  Filled 2013-05-28: qty 1

## 2013-05-28 NOTE — Progress Notes (Signed)
Pt rates his depression as a 2 on 1-10 scale with 10 being the most depressed. Pt reports that he has auditory hallucinations of a wolf howling. He denies any visual hallucinations. Pt is logical/coherant when stating that he has auditory and does not appear to be responding to internal stimuli. Pt reports that he has si thoughts to electrorcute himself. He c/o rt knee pain that he reports having on/off since he was a young child. A:Supported pt to discuss feelings. Gave medications as ordered and prn medication as needed. Offered encouragement 15 minute checks. R:Pt denies hi. Pt contracts with staff for safety. Safety maintained on the unit.

## 2013-05-28 NOTE — Tx Team (Signed)
  Interdisciplinary Treatment Plan Update   Date Reviewed:  05/28/2013  Time Reviewed:  4:35 PM  Progress in Treatment:   Attending groups: Yes Participating in groups: Yes Taking medication as prescribed: Yes  Tolerating medication: Yes Family/Significant other contact made: Yes  Patient understands diagnosis: Yes  Discussing patient identified problems/goals with staff: Yes Medical problems stabilized or resolved: Yes Denies suicidal/homicidal ideation: Yes Patient has not harmed self or others: Yes  For review of initial/current patient goals, please see plan of care.  Estimated Length of Stay:  3-5 days  Reason for Continuation of Hospitalization: Hallucinations Medication stabilization  New Problems/Goals identified:  N/A  Discharge Plan or Barriers:   return home, follow up outpt  Additional Comments:  Patient continues to reports hearing voices of wolf howling in his head. He also reports occasional mood swings, depression, delusional thinking, racing thoughts and decreased need for sleep. However, he is compliant with his medications and has not endorsed any adverse reactions.  Med switched to haldol yesterday.  Trazodone added for sleep today.   Attendees:  Signature: Thedore Mins, MD 05/28/2013 4:35 PM   Signature: Richelle Ito, [name] 05/28/2013 4:35 PM  Signature: Fransisca Kaufmann, NP 05/28/2013 4:35 PM  Signature: Joslyn Devon, RN 05/28/2013 4:35 PM  Signature: Liborio Nixon, RN 05/28/2013 4:35 PM  Signature:  05/28/2013 4:35 PM  Signature:   05/28/2013 4:35 PM  Signature:    Signature:    Signature:    Signature:    Signature:    Signature:      Scribe for Treatment Team:   Richelle Ito, [name]  05/28/2013 4:35 PM

## 2013-05-28 NOTE — Progress Notes (Signed)
Patient ID: Ryan Bartlett, male   DOB: 1996-07-19, 17 y.o.   MRN: [id number] [facility] MD Progress Note  05/28/2013 10:52 AM KAEDIN HICKLIN  MRN:  [id number] Subjective: "I am still hearing wolf  Howling worse at night and its makes it hard for me to sleep."  Objective: Patient continues to reports hearing voices of wolf howling in his head. He also reports occasional mood swings, depression, delusional thinking, racing thoughts and decreased need for sleep. However, he is compliant with his medications and has not endorsed any adverse reactions.  Diagnosis:   DSM5: Schizophrenia Disorders:  Delusional Disorder (297.1)  Substance/Addictive Disorders:  Cannabis Use Disorder - Moderate 9304.30) Bipolar I disorder, severe, most recent episode depressed with psychotic features.  Rule out Schizoaffective Disorder   ADL's:  Intact  Sleep: Fair  Appetite:  Good  Suicidal Ideation:  Plan:  No Intent:  No Means:  Yes Homicidal Ideation:  Plan:  Patient denies Intent:  Patient denies Means:  Patient denies AEB (as evidenced by): Behavior in the unit.  Psychiatric Specialty Exam: Review of Systems  Constitutional: Negative for fever and chills.  Respiratory: Negative.   Cardiovascular: Negative for chest pain, palpitations and orthopnea.  Gastrointestinal: Negative for heartburn, nausea, vomiting, abdominal pain, diarrhea and constipation.  Musculoskeletal: Negative.   Neurological: Negative for dizziness, seizures and loss of consciousness.  Psychiatric/Behavioral: Positive for depression and hallucinations. Negative for suicidal ideas, memory loss and substance abuse. The patient is nervous/anxious and has insomnia.     Blood pressure 134/83, pulse 87, temperature 97.6 F (36.4 C), temperature source Oral, resp. rate 18, height 5\' 8"  (1.727 m), weight 83.915 kg (185 lb).Body mass index is 28.14 kg/(m^2).  General Appearance: Casual and Fairly Groomed  Patent attorney::  Good  Speech:   Clear and Coherent and Normal Rate  Volume:  Normal  Mood:  Depressed  Affect:  Restricted  Thought Process:  Goal Directed  Orientation:  Full (Time, Place, and Person)  Thought Content:  Delusions and Hallucinations: Auditory  Suicidal Thoughts:  Yes.  without intent/plan  Homicidal Thoughts:  No  Memory:  Immediate;   Good Recent;   Good Remote;   Good  Judgement:  Impaired  Insight:  Shallow  Psychomotor Activity:  Normal  Concentration:  Good  Recall:  Fair  Akathisia:  No  Handed:  Left  AIMS (if indicated):   As noted in the chart  Assets:  Communication Skills Financial Resources/Insurance Housing Physical Health Social Support  Sleep:  Number of Hours: 5.25   Current Medications: Current Facility-Administered Medications  Medication Dose Route Frequency Provider Last Rate Last Dose  . acetaminophen (TYLENOL) tablet 650 mg  650 mg Oral Q6H PRN Earney Navy, NP   650 mg at 05/27/13 0441  . alum & mag hydroxide-simeth (MAALOX/MYLANTA) 200-200-20 MG/5ML suspension 30 mL  30 mL Oral Q4H PRN Earney Navy, NP      . benztropine (COGENTIN) tablet 0.5 mg  0.5 mg Oral BID Fransisca Kaufmann, NP   0.5 mg at 05/28/13 0740  . haloperidol (HALDOL) tablet 5 mg  5 mg Oral BID Fransisca Kaufmann, NP   5 mg at 05/28/13 0740  . hydrOXYzine (ATARAX/VISTARIL) tablet 50 mg  50 mg Oral Q6H PRN Caprice Kluver, MD   50 mg at 05/24/13 2120  . lamoTRIgine (LAMICTAL) tablet 100 mg  100 mg Oral Daily Nanine Means, NP   100 mg at 05/28/13 0740  . magnesium hydroxide (MILK OF MAGNESIA) suspension 30  mL  30 mL Oral Daily PRN Earney Navy, NP      . nicotine polacrilex (NICORETTE) gum 2 mg  2 mg Oral PRN Larena Sox, MD   2 mg at 05/28/13 1030  . traZODone (DESYREL) tablet 100 mg  100 mg Oral QHS PRN Larena Sox, MD   100 mg at 05/27/13 2135    Lab Results: No results found for this or any previous visit (from the past 48 hour(s)).  Physical Findings: AIMS: Facial and Oral  Movements Muscles of Facial Expression: None, normal Lips and Perioral Area: None, normal Jaw: None, normal Tongue: None, normal,Extremity Movements Upper (arms, wrists, hands, fingers): None, normal Lower (legs, knees, ankles, toes): None, normal, Trunk Movements Neck, shoulders, hips: None, normal, Overall Severity Severity of abnormal movements (highest score from questions above): None, normal Incapacitation due to abnormal movements: None, normal Patient's awareness of abnormal movements (rate only patient's report): No Awareness, Dental Status Current problems with teeth and/or dentures?: No Does patient usually wear dentures?: No  CIWA:    COWS:     Treatment Plan Summary: Daily contact with patient to assess and evaluate symptoms and progress in treatment Medication management Continue to take part in groups.  Plan: Continue crisis management and stabilization.  Medication management: Continue  Haldol 5 mg BID for psychosis/delusions. Give Cogentin 0.5 mg BID for EPS prevention. Continue Lamictal for mood stability and increase  Trazodone to 150mg  po Qhs  for sleep.  Encouraged patient to attend groups and participate in group counseling sessions and activities.  Discharge plan in progress.  Continue current treatment plan.  Address health issues: Vitals reviewed and stable.   Medical Decision Making Problem Points:  Established problem, worsening (2) and Review of psycho-social stressors (1) Data Points:  Order Aims Assessment (2) Review or order medicine tests (1) Review and summation of old records (2) Review of medication regiment & side effects (2) Review of new medications or change in dosage (2)  I certify that inpatient services furnished can reasonably be expected to improve the patient's condition.   Thedore Mins, MD 05/28/2013, 10:52 AM

## 2013-05-28 NOTE — Progress Notes (Signed)
Seen and agreed. Walter Min, MD 

## 2013-05-28 NOTE — [facility] Group Notes (Signed)
[facility] Group Notes:  (Nursing/MHT/Case Management/Adjunct)  Date:  05/28/2013 Time:  1:18 PM  Type of Therapy:  Group Therapy   Participation Level:  Minimal  Participation Quality:  Drowsy  Affect:  Appropriate  Cognitive:  Appropriate  Insight:  Appropriate  Engagement in Group:  Limited  Modes of Intervention: Discussion, Exploration and Socialization   Summary of Progress/Problems:  The topic for group was balance in life. Pt participated in the discussion about when their life was in balance and out of balance and how this feels. Pt discussed ways to get back in balance and short term goals they can work on to get where they want to be.  In response to how he can control his anger, Ryan Bartlett stated that he has to talk to someone to stay calm.  Identified that he moves from unbalance to balance by sleeping, skateboarding, and wrestling.  Indicated that he can sleep during the daytime at his sister's house when he D/C's. Laid down on the couch towards the end of group.  Spent much of group with blanket over his head and face.  Appears to prefer to tune out.  Simona Huh MSW Intern  05/28/2013 1:18 PM

## 2013-05-28 NOTE — Progress Notes (Signed)
Patient ID: Ryan Bartlett, male   DOB: [birth year], 17 y.o.   MRN: [id number] D: pt. Visible on the unit seen in hall and dayroom. Pt. Reports toothache/headache. Pt. Reports "I still hear the howls, like a wolve. A: Writer provided emotional support and administered Tylenol 650 mg for pain. Staff will monitor q13min for safety. Staff encouraged group. R: pt. Is safe on the unit. Pt. Attended group.

## 2013-05-29 NOTE — Progress Notes (Signed)
D:Pt continues to report hallucinations of hearing wolves howling and passive SI. Pt is interacting with peers and no responding to internal stimuli.  A:Supported pt to discuss feelings. Gave medications as ordered and 15 minute checks.  R:Pt contracts with staff for safety. Safety maintained on the unit.

## 2013-05-29 NOTE — Progress Notes (Signed)
Patient ID: Ryan Bartlett, male   DOB: Feb 13, 1996, 17 y.o.   MRN: [id number] [facility] MD Progress Note  05/29/2013 11:13 AM Ryan Bartlett  MRN:  [id number] Subjective: "I am still not doing better. I hear the howling of wolves. I feel like my life has no purpose. I have suicidal thoughts to kill myself by putting a radio in the tub if I had one. I have tried so many ways to die but nothing works. I have no goals for myself. I would rate my depression at eight and my hopelessness at ten."   Objective: Patient continues to reports hearing voices of wolf howling in his head. He also reports occasional mood swings, depression, delusional thinking, and trouble sleeping. However, he is compliant with his medications and has not endorsed any adverse reactions. He is visible on the unit observed interacting with peers and attending groups.   Diagnosis:   DSM5: Schizophrenia Disorders:  Delusional Disorder (297.1)  Substance/Addictive Disorders:  Cannabis Use Disorder - Moderate 9304.30) Bipolar I disorder, severe, most recent episode depressed with psychotic features.  Rule out Schizoaffective Disorder  ADL's:  Intact  Sleep: Fair  Appetite:  Good  Suicidal Ideation:  Patient presently contracts for safety on the unit.  Homicidal Ideation:  Plan:  Patient denies Intent:  Patient denies Means:  Patient denies AEB (as evidenced by): Behavior in the unit.  Psychiatric Specialty Exam: Review of Systems  Constitutional: Negative for fever and chills.  Respiratory: Negative.   Cardiovascular: Negative for chest pain, palpitations and orthopnea.  Gastrointestinal: Negative for heartburn, nausea, vomiting, abdominal pain, diarrhea and constipation.  Musculoskeletal: Negative.   Neurological: Negative for dizziness, seizures and loss of consciousness.  Psychiatric/Behavioral: Positive for depression and hallucinations. Negative for suicidal ideas, memory loss and substance abuse. The patient is  nervous/anxious and has insomnia.     Blood pressure 113/77, pulse 70, temperature 97 F (36.1 C), temperature source Oral, resp. rate 16, height 5\' 8"  (1.727 m), weight 83.915 kg (185 lb).Body mass index is 28.14 kg/(m^2).  General Appearance: Casual and Fairly Groomed  Patent attorney::  Good  Speech:  Clear and Coherent and Normal Rate  Volume:  Normal  Mood:  Depressed  Affect:  Restricted  Thought Process:  Goal Directed  Orientation:  Full (Time, Place, and Person)  Thought Content:  Delusions and Hallucinations: Auditory  Suicidal Thoughts:  Yes.  without intent/plan  Homicidal Thoughts:  No  Memory:  Immediate;   Good Recent;   Good Remote;   Good  Judgement:  Impaired  Insight:  Shallow  Psychomotor Activity:  Normal  Concentration:  Good  Recall:  Fair  Akathisia:  No  Handed:  Left  AIMS (if indicated):   As noted in the chart  Assets:  Communication Skills Financial Resources/Insurance Housing Physical Health Social Support  Sleep:  Number of Hours: 4   Current Medications: Current Facility-Administered Medications  Medication Dose Route Frequency Provider Last Rate Last Dose  . acetaminophen (TYLENOL) tablet 650 mg  650 mg Oral Q6H PRN Earney Navy, NP   650 mg at 05/28/13 2133  . alum & mag hydroxide-simeth (MAALOX/MYLANTA) 200-200-20 MG/5ML suspension 30 mL  30 mL Oral Q4H PRN Earney Navy, NP      . benztropine (COGENTIN) tablet 0.5 mg  0.5 mg Oral BID Fransisca Kaufmann, NP   0.5 mg at 05/29/13 0743  . haloperidol (HALDOL) tablet 5 mg  5 mg Oral BID Fransisca Kaufmann, NP   5 mg  at 05/29/13 0743  . hydrOXYzine (ATARAX/VISTARIL) tablet 50 mg  50 mg Oral Q6H PRN Caprice Kluver, MD   50 mg at 05/24/13 2120  . lamoTRIgine (LAMICTAL) tablet 100 mg  100 mg Oral Daily Nanine Means, NP   100 mg at 05/29/13 0743  . magnesium hydroxide (MILK OF MAGNESIA) suspension 30 mL  30 mL Oral Daily PRN Earney Navy, NP      . nicotine polacrilex (NICORETTE) gum 2 mg  2 mg  Oral PRN Larena Sox, MD   2 mg at 05/29/13 0744  . traZODone (DESYREL) tablet 150 mg  150 mg Oral QHS Mojeed Akintayo   150 mg at 05/28/13 2134    Lab Results: No results found for this or any previous visit (from the past 48 hour(s)).  Physical Findings: AIMS: Facial and Oral Movements Muscles of Facial Expression: None, normal Lips and Perioral Area: None, normal Jaw: None, normal Tongue: None, normal,Extremity Movements Upper (arms, wrists, hands, fingers): None, normal Lower (legs, knees, ankles, toes): None, normal, Trunk Movements Neck, shoulders, hips: None, normal, Overall Severity Severity of abnormal movements (highest score from questions above): None, normal Incapacitation due to abnormal movements: None, normal Patient's awareness of abnormal movements (rate only patient's report): No Awareness, Dental Status Current problems with teeth and/or dentures?: No Does patient usually wear dentures?: No  CIWA:    COWS:     Treatment Plan Summary: Daily contact with patient to assess and evaluate symptoms and progress in treatment Medication management Continue to take part in groups.  Plan: Continue crisis management and stabilization.  Medication management: Continue  Haldol 5 mg BID for psychosis/delusions. Give Cogentin 0.5 mg BID for EPS prevention. Continue Lamictal for mood stability and continue Trazodone to 150mg  po Qhs for sleep.  Encouraged patient to attend groups and participate in group counseling sessions and activities.  Discharge plan in progress. Anticipate d/c on Monday.  Continue current treatment plan.  Address health issues: Vitals reviewed and stable.   Medical Decision Making Problem Points:  Established problem, stabilizing (1) and Review of psycho-social stressors (1) Data Points:  Order Aims Assessment (2) Review or order medicine tests (1) Review and summation of old records (2) Review of medication regiment & side effects (2) Review of  new medications or change in dosage (2)  I certify that inpatient services furnished can reasonably be expected to improve the patient's condition.   Fransisca Kaufmann, NP-C 05/29/2013, 11:13 AM

## 2013-05-29 NOTE — Progress Notes (Signed)
D: Pt c/o bilateral foot pain. No wounds noted by Clinical research associate.  Pt reports hearing howling sounds. Pt pos for SI. Pt contracts for safety. A: Medications administered as ordered per MD. Verbal support given. Pt encouraged to attend groups. 15 minute checks performed for safety. R: Pt attended evening group. Safety maintained.

## 2013-05-29 NOTE — [facility] Group Notes (Signed)
[facility] [name] Group Therapy  05/29/2013  1:05 PM  Type of Therapy:  Group therapy  Participation Level:  Got Nikolas out of bed to attend.  He did not want to come, and true to his word, did not participate.  Slept.    Summary of Progress/Problems:  Chaplain was here to lead a group on themes of hope and courage.  [name] 05/29/2013 1:14 PM

## 2013-05-29 NOTE — [facility] Group Notes (Signed)
[facility] [name] Aftercare Discharge Planning Group Note   05/29/2013 10:05 AM  Participation Quality:  Engaged  Mood/Affect:  Flat  Depression Rating:  denies  Anxiety Rating:  denies  Thoughts of Suicide:  No Will you contract for safety?   NA  Current AVH:  Yes  States he is still hearing howling, but to a lesser degree.  Plan for Discharge/Comments:  States he plans to return to housing with his "sister."  Ok with being here thru the weekend.  No new complaints.  States the new medication is causing no problems.  Transportation Means: ACT team  Supports: ACT team  Ryan Bartlett

## 2013-05-29 NOTE — Progress Notes (Signed)
Recreation Therapy Notes  Date: 10.10.2014 Time: 9:30am Location: [address] Dayroom  Group Topic: Leisure Education  Goal Area(s) Addresses:  Patient will verbalize activity of interest by end of group session. Patient will verbalize the ability to use positive leisure/recreation as a coping mechanism.  Behavioral Response: Partially engaged, Appropriate  Intervention: Game  Activity: Leisure Charades. Patients selected leisure activity from provided container and acted them out for group to guess.   Education:  Discharge Planning, Coping Skills  Education Outcome: Acknowledges understanding  Clinical Observations/Feedback: Patient partially participated in group session, preferring to guess leisure activities vs act them out. Patient wandered in and out of group session periodically. Patient was not present for group discussion on the benefits of leisure.   [name] Jeilani Grupe, LRT/CTRS  Mckyla Deckman L 05/29/2013 12:16 PM

## 2013-05-30 NOTE — Progress Notes (Signed)
The focus of this group is to help patients review their daily goal of treatment and discuss progress on daily workbooks. Pt attended the evening group session and responded to all discussion prompts from the Writer. Pt reported having had a good day on the unit, the highlight of which was being able to go outside to the courtyard twice. Pt was active in group and volunteered supportive comments to his peers when they spoke. Pt reported having no additional needs from Nursing Staff this evening. His affect was appropriate.

## 2013-05-30 NOTE — Progress Notes (Signed)
Patient ID: Ryan Bartlett, male   DOB: 04/17/1996, 17 y.o.   MRN: [id number]  [facility] MD Progress Note  05/30/2013 4:21 PM Ryan Bartlett  MRN:  [id number] Subjective:  Patient states "I finally slept better last night. The howling is still there but it is not as loud. I am still having thoughts of hurting myself but I would not do that here. I would rate my depression at a six today."   Objective: Patient continues to reports hearing voices of wolf howling in his head. He also reports occasional mood swings, depression, delusional thinking that he is  wolf, and trouble sleeping. However, he is compliant with his medications and has not endorsed any adverse reactions. He is visible on the unit observed interacting with peers and attending groups.   Diagnosis:   DSM5: Schizophrenia Disorders:  Delusional Disorder (297.1)  Substance/Addictive Disorders:  Cannabis Use Disorder - Moderate 9304.30) Bipolar I disorder, severe, most recent episode depressed with psychotic features.  Rule out Schizoaffective Disorder  ADL's:  Intact  Sleep: Fair  Appetite:  Good  Suicidal Ideation:  Patient presently contracts for safety on the unit.  Homicidal Ideation:  Plan:  Patient denies Intent:  Patient denies Means:  Patient denies AEB (as evidenced by): Behavior in the unit.  Psychiatric Specialty Exam: Review of Systems  Constitutional: Negative for fever and chills.  Respiratory: Negative.   Cardiovascular: Negative for chest pain, palpitations and orthopnea.  Gastrointestinal: Negative for heartburn, nausea, vomiting, abdominal pain, diarrhea and constipation.  Musculoskeletal: Negative.   Neurological: Negative for dizziness, seizures and loss of consciousness.  Psychiatric/Behavioral: Positive for depression and hallucinations. Negative for suicidal ideas, memory loss and substance abuse. The patient is nervous/anxious and has insomnia.     Blood pressure 119/79, pulse 102, temperature  97.9 F (36.6 C), temperature source Oral, resp. rate 16, height 5\' 8"  (1.727 m), weight 83.915 kg (185 lb).Body mass index is 28.14 kg/(m^2).  General Appearance: Casual and Fairly Groomed  Patent attorney::  Good  Speech:  Clear and Coherent and Normal Rate  Volume:  Normal  Mood:  Depressed  Affect:  Restricted  Thought Process:  Goal Directed  Orientation:  Full (Time, Place, and Person)  Thought Content:  Delusions and Hallucinations: Auditory  Suicidal Thoughts:  Yes.  without intent/plan  Homicidal Thoughts:  No  Memory:  Immediate;   Good Recent;   Good Remote;   Good  Judgement:  Impaired  Insight:  Shallow  Psychomotor Activity:  Normal  Concentration:  Good  Recall:  Fair  Akathisia:  No  Handed:  Left  AIMS (if indicated):   As noted in the chart  Assets:  Communication Skills Financial Resources/Insurance Housing Physical Health Social Support  Sleep:  Number of Hours: 6.25   Current Medications: Current Facility-Administered Medications  Medication Dose Route Frequency Provider Last Rate Last Dose  . acetaminophen (TYLENOL) tablet 650 mg  650 mg Oral Q6H PRN Earney Navy, NP   650 mg at 05/28/13 2133  . alum & mag hydroxide-simeth (MAALOX/MYLANTA) 200-200-20 MG/5ML suspension 30 mL  30 mL Oral Q4H PRN Earney Navy, NP      . benztropine (COGENTIN) tablet 0.5 mg  0.5 mg Oral BID Fransisca Kaufmann, NP   0.5 mg at 05/30/13 1609  . haloperidol (HALDOL) tablet 5 mg  5 mg Oral BID Fransisca Kaufmann, NP   5 mg at 05/30/13 1609  . hydrOXYzine (ATARAX/VISTARIL) tablet 50 mg  50 mg Oral Q6H PRN Vinay  Genia Harold, MD   50 mg at 05/30/13 1505  . lamoTRIgine (LAMICTAL) tablet 100 mg  100 mg Oral Daily Nanine Means, NP   100 mg at 05/30/13 0732  . magnesium hydroxide (MILK OF MAGNESIA) suspension 30 mL  30 mL Oral Daily PRN Earney Navy, NP      . nicotine polacrilex (NICORETTE) gum 2 mg  2 mg Oral PRN Larena Sox, MD   2 mg at 05/30/13 1505  . traZODone (DESYREL)  tablet 150 mg  150 mg Oral QHS Mojeed Akintayo   150 mg at 05/29/13 2148    Lab Results: No results found for this or any previous visit (from the past 48 hour(s)).  Physical Findings: AIMS: Facial and Oral Movements Muscles of Facial Expression: None, normal Lips and Perioral Area: None, normal Jaw: None, normal Tongue: None, normal,Extremity Movements Upper (arms, wrists, hands, fingers): None, normal Lower (legs, knees, ankles, toes): None, normal, Trunk Movements Neck, shoulders, hips: None, normal, Overall Severity Severity of abnormal movements (highest score from questions above): None, normal Incapacitation due to abnormal movements: None, normal Patient's awareness of abnormal movements (rate only patient's report): No Awareness, Dental Status Current problems with teeth and/or dentures?: No Does patient usually wear dentures?: No  CIWA:    COWS:     Treatment Plan Summary: Daily contact with patient to assess and evaluate symptoms and progress in treatment Medication management Continue to take part in groups.  Plan: Continue crisis management and stabilization.  Medication management: Continue  Haldol 5 mg BID for psychosis/delusions. Give Cogentin 0.5 mg BID for EPS prevention. Continue Lamictal for mood stability and continue Trazodone to 150mg  po Qhs for sleep.  Encouraged patient to attend groups and participate in group counseling sessions and activities.  Discharge plan in progress. Anticipate d/c on Monday.  Continue current treatment plan.  Address health issues: Vitals reviewed and stable.   Medical Decision Making Problem Points:  Established problem, stabilizing (1) and Review of psycho-social stressors (1) Data Points:  Order Aims Assessment (2) Review or order medicine tests (1) Review and summation of old records (2) Review of medication regiment & side effects (2) Review of new medications or change in dosage (2)  I certify that inpatient services  furnished can reasonably be expected to improve the patient's condition.   Fransisca Kaufmann, NP-C 05/30/2013, 4:21 PM I agreed with findings and treatment plan of this patient  I agreed with findings and treatment plan of this patient.

## 2013-05-30 NOTE — Progress Notes (Signed)
Patient ID: Ryan Bartlett, male   DOB: 05-Nov-1995, 17 y.o.   MRN: [id number] 05-30-13 nursing shift note: D: pt stated that he is feeling better. He stated that the auditory hallucinations are decreasing and continues to endorse mood swings. He stated he has constant SI,  but when RN assesses him the suicide ideation seems to be more passive. A: when asked he is able to contract verbally for the suicide ideation. He has spent much of the day in bed, sleeping. Staff continues to encourage and support him. R: on his inventory sheet he wrote: slept poor, appetite good, energy normal, attention improving and he didn't not address his depression or hopelessness. The rest of the inventory sheet was blank.  RN continues to monitor and Q 15 min ck's continue.

## 2013-05-30 NOTE — Progress Notes (Addendum)
Patient ID: Ryan Bartlett, male   DOB: 1996-04-01, 17 y.o.   MRN: [id number] Psychoeducational Group Note  Date:  05/30/2013 Time:0900am  Group Topic/Focus:  Identifying Needs:   The focus of this group is to help patients identify their personal needs that have been historically problematic and identify healthy behaviors to address their needs.  Participation Level:  Did Not Attend  Participation Quality:    Affect:  Cognitive:  Insight: Engagement in Group:Additional Comments:inventory group   [name] 05/30/2013,9:38 AM

## 2013-05-30 NOTE — [facility] Group Notes (Signed)
[facility] Group Notes:  (Nursing/MHT/Case Management/Adjunct)  Date:  05/30/2013  Time:  5:21 PM  Type of Therapy:  Psychoeducational Skills  Participation Level:  Active  Participation Quality:  Appropriate  Affect:  Appropriate  Cognitive:  Appropriate  Insight:  Appropriate  Engagement in Group:  Engaged  Modes of Intervention:  Problem-solving  Summary of Progress/Problems: Pt attended healthy coping skills group, and participated in outside activity.   Jacquelyne Balint Shanta 05/30/2013, 5:21 PM

## 2013-05-31 MED ORDER — HALOPERIDOL 5 MG PO TABS
10.0000 mg | ORAL_TABLET | Freq: Two times a day (BID) | ORAL | Status: DC
  Administered 2013-05-31 – 2013-06-01 (×2): 10 mg via ORAL
  Filled 2013-05-31 (×5): qty 2

## 2013-05-31 MED ORDER — BENZTROPINE MESYLATE 1 MG PO TABS
1.0000 mg | ORAL_TABLET | Freq: Two times a day (BID) | ORAL | Status: DC
  Administered 2013-05-31 – 2013-06-01 (×2): 1 mg via ORAL
  Filled 2013-05-31 (×4): qty 1

## 2013-05-31 NOTE — Progress Notes (Signed)
Patient ID: CYLAN BORUM, male   DOB: 1995-12-05, 17 y.o.   MRN: [id number] 05-31-2013 @ 1558 nursing shift note: d: pt stated he continues to have passive SI, but is able to verbally contract for the passive SI. He denies any self harm or hi. He is going to meals, taking his medications and participating in groups. A: staff continues to support and encourage him. He asked the RN to dress his blister on the bottom of his right foot. Nurse applied a dressing, neosporin and a 2x2 guaze. He also had some calf pain that was resolved with acetaminophen prn. Haldol was increased to help with his auditory hallucinations at night. R: on his inventory sheet he wrote; slept fair, appetite good, energy normal, attention good, with his depression at 4 and hopelessness at 5. He stated he is having some withdrawal cravings. After discharge he plans to "stop smoking".  RN will monitor and Q 15 min ck's continue.

## 2013-05-31 NOTE — [facility] Group Notes (Signed)
[facility] Group Notes:  (Clinical Social Work)  05/31/2013   11:15am-12:00pm  Summary of Progress/Problems:  The main focus of today's process group was to listen to a variety of genres of music and to identify that different types of music provoke different responses.  The patient then was able to identify personally what was soothing for them, as well as energizing.  Handouts were used to record feelings evoked, as well as how patient can personally use this knowledge in sleep habits, with depression, and with other symptoms.  The patient slept through group, would not sit up or go to his room.  Type of Therapy:  Music Therapy   Participation Level:  None  Participation Quality:  Drowsy  Affect:  Blunted  Cognitive:  Oriented  Insight:  None  Engagement in Therapy:  None  Modes of Intervention:   Activity, Exploration  Ambrose Mantle, [name] 05/31/2013, 12:48 PM

## 2013-05-31 NOTE — Progress Notes (Signed)
Pt up and around milieu. Restless during group time. Walking in and out, switching seats often. States today has been a better day overall. Continues to hear "the howling" though also states it's better overall. Denies VH. Maintains his sleep is poor however chart and MHT on hall indicate he has slept soundly. Pt given support. Encouraged to speak with team tomorrow about his sleep concerns should they persist tonight. Denies SI/HI and remains safe. Lawrence Marseilles
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
## 2013-05-31 NOTE — Progress Notes (Signed)
Patient ID: Ryan Bartlett, male   DOB: [birth year], 17 y.o.   MRN: [id number] Psychoeducational Group Note  Date:  05/31/2013 Time:  0930am  Group Topic/Focus:  Making Healthy Choices:   The focus of this group is to help patients identify negative/unhealthy choices they were using prior to admission and identify positive/healthier coping strategies to replace them upon discharge.  Participation Level:  Active  Participation Quality:  Appropriate  Affect:  Appropriate  Cognitive:  Appropriate  Insight:  Supportive  Engagement in Group:  Supportive  Additional Comments:  Inventory group   [name] 05/31/2013,10:16 AM

## 2013-05-31 NOTE — [facility] Group Notes (Signed)
[facility] Group Notes:  (Nursing/MHT/Case Management/Adjunct)  Date:  05/31/2013  Time:  11:33 AM  Type of Therapy:  Psychoeducational Skills  Participation Level:  Active  Participation Quality:  Appropriate  Affect:  Appropriate  Cognitive:  Appropriate  Insight:  Appropriate  Engagement in Group:  Engaged  Modes of Intervention:  Problem-solving  Summary of Progress/Problems: Pt attended healthy coping skills group, pt also engaged in off the unit activity.   Jacquelyne Balint Shanta 05/31/2013, 11:33 AM

## 2013-05-31 NOTE — Progress Notes (Addendum)
Patient ID: Ryan Bartlett, male   DOB: 1995/10/25, 17 y.o.   MRN: [id number]     [facility] MD Progress Note  05/31/2013 2:25 PM Ryan Bartlett  MRN:  [id number] Subjective:  Patient states "Well I'm still hearing the howling at night that is disrupting my sleep. I don't really think it's better. I don't hear it as much during the day just at night."  Objective: Patient continues to reports hearing voices of wolf howling in his head. He also reports occasional mood swings, depression, delusional thinking that he is wolf, and trouble sleeping. However, he is compliant with his medications and has not endorsed any adverse reactions. The patient is observed on the unit interacting with peers and appears to be in no acute distress.   Diagnosis:   DSM5: Schizophrenia Disorders:  Delusional Disorder (297.1)  Substance/Addictive Disorders:  Cannabis Use Disorder - Moderate 9304.30) Bipolar I disorder, severe, most recent episode depressed with psychotic features.  Rule out Schizoaffective Disorder  ADL's:  Intact  Sleep: Fair  Appetite:  Good  Suicidal Ideation:  Patient presently contracts for safety on the unit.  Homicidal Ideation:  Plan:  Patient denies Intent:  Patient denies Means:  Patient denies AEB (as evidenced by): Behavior in the unit.  Psychiatric Specialty Exam: Review of Systems  Constitutional: Negative for fever and chills.  Respiratory: Negative.   Cardiovascular: Negative for chest pain, palpitations and orthopnea.  Gastrointestinal: Negative for heartburn, nausea, vomiting, abdominal pain, diarrhea and constipation.  Musculoskeletal: Negative.   Neurological: Negative for dizziness, seizures and loss of consciousness.  Psychiatric/Behavioral: Positive for depression and hallucinations. Negative for suicidal ideas, memory loss and substance abuse. The patient is nervous/anxious and has insomnia.     Blood pressure 120/63, pulse 94, temperature 98.6 F (37 C),  temperature source Oral, resp. rate 16, height 5\' 8"  (1.727 m), weight 83.915 kg (185 lb).Body mass index is 28.14 kg/(m^2).  General Appearance: Casual and Fairly Groomed  Patent attorney::  Good  Speech:  Clear and Coherent and Normal Rate  Volume:  Normal  Mood:  Depressed  Affect:  Restricted  Thought Process:  Goal Directed  Orientation:  Full (Time, Place, and Person)  Thought Content:  Delusions and Hallucinations: Auditory  Suicidal Thoughts:  Yes.  without intent/plan  Homicidal Thoughts:  No  Memory:  Immediate;   Good Recent;   Good Remote;   Good  Judgement:  Impaired  Insight:  Shallow  Psychomotor Activity:  Normal  Concentration:  Good  Recall:  Fair  Akathisia:  No  Handed:  Left  AIMS (if indicated):   As noted in the chart  Assets:  Communication Skills Financial Resources/Insurance Housing Physical Health Social Support  Sleep:  Number of Hours: 6   Current Medications: Current Facility-Administered Medications  Medication Dose Route Frequency Provider Last Rate Last Dose  . acetaminophen (TYLENOL) tablet 650 mg  650 mg Oral Q6H PRN Earney Navy, NP   650 mg at 05/31/13 1352  . alum & mag hydroxide-simeth (MAALOX/MYLANTA) 200-200-20 MG/5ML suspension 30 mL  30 mL Oral Q4H PRN Earney Navy, NP      . benztropine (COGENTIN) tablet 0.5 mg  0.5 mg Oral BID Fransisca Kaufmann, NP   0.5 mg at 05/31/13 0729  . haloperidol (HALDOL) tablet 5 mg  5 mg Oral BID Fransisca Kaufmann, NP   5 mg at 05/31/13 5638  . hydrOXYzine (ATARAX/VISTARIL) tablet 50 mg  50 mg Oral Q6H PRN Caprice Kluver, MD  50 mg at 05/30/13 1505  . lamoTRIgine (LAMICTAL) tablet 100 mg  100 mg Oral Daily Nanine Means, NP   100 mg at 05/31/13 0729  . magnesium hydroxide (MILK OF MAGNESIA) suspension 30 mL  30 mL Oral Daily PRN Earney Navy, NP      . nicotine polacrilex (NICORETTE) gum 2 mg  2 mg Oral PRN Larena Sox, MD   2 mg at 05/31/13 0643  . traZODone (DESYREL) tablet 150 mg  150 mg Oral  QHS Mojeed Akintayo   150 mg at 05/30/13 2110    Lab Results: No results found for this or any previous visit (from the past 48 hour(s)).  Physical Findings: AIMS: Facial and Oral Movements Muscles of Facial Expression: None, normal Lips and Perioral Area: None, normal Jaw: None, normal Tongue: None, normal,Extremity Movements Upper (arms, wrists, hands, fingers): None, normal Lower (legs, knees, ankles, toes): None, normal, Trunk Movements Neck, shoulders, hips: None, normal, Overall Severity Severity of abnormal movements (highest score from questions above): None, normal Incapacitation due to abnormal movements: None, normal Patient's awareness of abnormal movements (rate only patient's report): No Awareness, Dental Status Current problems with teeth and/or dentures?: No Does patient usually wear dentures?: No  CIWA:    COWS:     Treatment Plan Summary: Daily contact with patient to assess and evaluate symptoms and progress in treatment Medication management Continue to take part in groups.  Plan: Continue crisis management and stabilization.  Medication management: Increase Haldol 10 mg BID for psychosis/delusions. Give Cogentin 1 mg BID for EPS prevention. Continue Lamictal for mood stability and continue Trazodone to 150mg  po Qhs for sleep.  Encouraged patient to attend groups and participate in group counseling sessions and activities.  Discharge plan in progress. Anticipate d/c on Monday.  Continue current treatment plan.  Address health issues: Vitals reviewed and stable.   Medical Decision Making Problem Points:  Established problem, stabilizing (1) and Review of psycho-social stressors (1) Data Points:  Order Aims Assessment (2) Review or order medicine tests (1) Review and summation of old records (2) Review of medication regiment & side effects (2) Review of new medications or change in dosage (2)  I certify that inpatient services furnished can reasonably be  expected to improve the patient's condition.   Fransisca Kaufmann, NP-C 05/31/2013, 2:25 PM  I agreed with findings and treatment plan of this patient

## 2013-05-31 NOTE — Progress Notes (Signed)
The focus of this group is to help patients review their daily goal of treatment and discuss progress on daily workbooks. Pt attended the evening group session and responded to discussion prompts from the Writer. Pt reported having had a good day on the unit, the highlights of which were being able to play basketball in the gym and meal times. Pt shared that he planned to take better care of himself by "being the best me I can possibly be." Pt reported having no additional needs from Nursing Staff this evening beyond towels and soap, which were given to him following group. Pt was unable to remain seated during group and got up/moved seats several times. Pt also required redirection so as not to talk over his peers while they spoke. Pt's affect was neutral.

## 2013-05-31 NOTE — [facility] Group Notes (Signed)
Adult Psychoeducational Group Note  Date:  05/31/2013 Time:  5:02 PM  Group Topic/Focus:  Making Healthy Choices:   The focus of this group is to help patients identify negative/unhealthy choices they were using prior to admission and identify positive/healthier coping strategies to replace them upon discharge.  Participation Level:  Active  Participation Quality:  Appropriate  Affect:  Appropriate  Cognitive:  Appropriate  Insight: Appropriate  Engagement in Group:  Engaged  Modes of Intervention:  Discussion, Socialization and Support  Additional Comments:  Pt participated during the group activity and shared during the discussion.  Tania Ade 05/31/2013, 5:02 PM

## 2013-06-01 DIAGNOSIS — F411 Generalized anxiety disorder: Secondary | ICD-10-CM

## 2013-06-01 DIAGNOSIS — F919 Conduct disorder, unspecified: Secondary | ICD-10-CM

## 2013-06-01 DIAGNOSIS — F3130 Bipolar disorder, current episode depressed, mild or moderate severity, unspecified: Secondary | ICD-10-CM

## 2013-06-01 DIAGNOSIS — F909 Attention-deficit hyperactivity disorder, unspecified type: Secondary | ICD-10-CM

## 2013-06-01 MED ORDER — TRAZODONE HCL 150 MG PO TABS: 150.0000 mg | ORAL_TABLET | Freq: Every day | ORAL | Status: DC

## 2013-06-01 MED ORDER — LAMOTRIGINE 100 MG PO TABS: 100.0000 mg | ORAL_TABLET | Freq: Every day | ORAL | Status: DC

## 2013-06-01 MED ORDER — HALOPERIDOL 10 MG PO TABS: 10.0000 mg | ORAL_TABLET | Freq: Two times a day (BID) | ORAL | Status: DC

## 2013-06-01 MED ORDER — HYDROXYZINE HCL 50 MG PO TABS: 50.0000 mg | ORAL_TABLET | Freq: Four times a day (QID) | ORAL | Status: DC | PRN

## 2013-06-01 MED ORDER — BENZTROPINE MESYLATE 1 MG PO TABS: 1.0000 mg | ORAL_TABLET | Freq: Two times a day (BID) | ORAL | Status: DC

## 2013-06-01 NOTE — Tx Team (Signed)
  Interdisciplinary Treatment Plan Update   Date Reviewed:  06/01/2013  Time Reviewed:  11:05 AM  Progress in Treatment:   Attending groups: Yes Participating in groups: Yes Taking medication as prescribed: Yes  Tolerating medication: Yes Family/Significant other contact made: Yes  Patient understands diagnosis: Yes  Discussing patient identified problems/goals with staff: Yes Medical problems stabilized or resolved: Yes Denies suicidal/homicidal ideation: Yes Patient has not harmed self or others: Yes  For review of initial/current patient goals, please see plan of care.  Estimated Length of Stay:  D/C today  Reason for Continuation of Hospitalization:   New Problems/Goals identified:  N/A  Discharge Plan or Barriers:   return home, follow up outpt  Additional Comments:  Attendees:  Signature: Thedore Mins, MD 06/01/2013 11:05 AM   Signature: Richelle Ito, [name] 06/01/2013 11:05 AM  Signature: Fransisca Kaufmann, NP 06/01/2013 11:05 AM  Signature: Joslyn Devon, RN 06/01/2013 11:05 AM  Signature: Liborio Nixon, RN 06/01/2013 11:05 AM  Signature:  06/01/2013 11:05 AM  Signature:   06/01/2013 11:05 AM  Signature:    Signature:    Signature:    Signature:    Signature:    Signature:      Scribe for Treatment Team:   Richelle Ito, [name]  06/01/2013 11:05 AM

## 2013-06-01 NOTE — [facility] Suicide Risk Assessment (Signed)
Suicide Risk Assessment  Discharge Assessment     Demographic Factors:  Male, Adolescent or young adult, Low socioeconomic status and Unemployed  Mental Status Per Nursing Assessment::   On Admission:     Current Mental Status by Physician: patient denies suicidal ideation, intent or plan  Loss Factors: Financial problems/change in socioeconomic status  Historical Factors: Family history of mental illness or substance abuse and Impulsivity  Risk Reduction Factors:   Sense of responsibility to family, Living with another person, especially a relative and Positive therapeutic relationship  Continued Clinical Symptoms:  Resolving mood and delusional symptoms  Cognitive Features That Contribute To Risk:  Closed-mindedness Polarized thinking    Suicide Risk:  Minimal: No identifiable suicidal ideation.  Patients presenting with no risk factors but with morbid ruminations; may be classified as minimal risk based on the severity of the depressive symptoms  Discharge Diagnoses:   AXIS I:  Bipolar I disorder, most recent episode (or current) depressed, severe, without mention of psychotic behavior              Cannabis use disorder AXIS II:  Deferred AXIS III:   Past Medical History  Diagnosis Date   AXIS IV:  other psychosocial or environmental problems and problems related to social environment AXIS V:  61-70 mild symptoms  Plan Of Care/Follow-up recommendations:  Activity:  as tolerated Diet:  healthy Tests:  routine Other:  patient to keep his after care appointment  Is patient on multiple antipsychotic therapies at discharge:  No   Has Patient had three or more failed trials of antipsychotic monotherapy by history:  No  Recommended Plan for Multiple Antipsychotic Therapies: N/A  Thedore Mins, MD 06/01/2013, 9:38 AM

## 2013-06-01 NOTE — Discharge Summary (Signed)
Physician Discharge Summary Note  Patient:  Ryan Bartlett is an 17 y.o., male MRN:  [id number] DOB:  January 18, 1996 Patient phone:  [phone] (home)  Patient address:   [address] [place] [address],   Date of Admission:  05/22/2013 Date of Discharge: 06/01/2013  Reason for Admission:  Delusional, depression with suicidal ideations and a plan  Discharge Diagnoses: Principal Problem:   Bipolar I disorder, most recent episode (or current) depressed, severe, without mention of psychotic behavior Active Problems:   ADHD (attention deficit hyperactivity disorder), combined type   Cannabis abuse   Conduct disorder, adolescent onset type  Review of Systems  Constitutional: Negative.   HENT: Negative.   Eyes: Negative.   Respiratory: Negative.   Cardiovascular: Negative.   Gastrointestinal: Negative.   Genitourinary: Negative.   Musculoskeletal: Negative.   Skin: Negative.   Neurological: Negative.   Endo/Heme/Allergies: Negative.   Psychiatric/Behavioral: The patient is nervous/anxious.     DSM5:  Substance/Addictive Disorders:  Cannabis Use Disorder - Moderate 9304.30)  Axis Diagnosis:   AXIS I:  ADHD, hyperactive type, Anxiety Disorder NOS, Bipolar, Depressed and Conduct Disorder AXIS II:  Deferred AXIS III:   Past Medical History  Diagnosis Date  . ADHD (attention deficit hyperactivity disorder)   . Unspecified episodic mood disorder   . Oppositional defiant disorder   . Bipolar disorder   . Depression    AXIS IV:  other psychosocial or environmental problems, problems related to social environment and problems with primary support group AXIS V:  61-70 mild symptoms  Level of Care:  OP  Hospital Course:  On admission:  17 year old black male that reports SI with a plan to kill himself by cutting his wrist or by hitting himself in the head with his boot. Patient reports that he has to kill himself because the howling dogs are telling him to do it. Patient  reports that he can, "talk to and understand what all dogs are saying". Patient reports that he has been able to understand dogs since he was 89 years old. Patient reports that he hears dogs howling and talking to him on a daily basis. Patient reports that he has not been taking his medication for one week. Patient reports that he is not able to contract for safety. Patient reports a prior history of psychiatric hospitalizations. Patient is a poor historian and was not able to tell me when or where he was hospitalized for previous suicide attemepts. Documentation in the epic chart does not reports any incident. 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 Patient reports a history of Bipolar Disorder, Schizophrenia, Oppositional Defiant Disorder, ADHD, and Depression. Patient reports a history of cutting and biting himself. Patient reports that he began biting himself when he was 17 years old. Patient reports that he bites himself daily. Patient reports that he began cutting himself when he was 17 years old. Patient reports that he cuts himself in the wrist daily. Patient denies denies homicidal ideations as well as any illicit drug use.   During hospitalization:  Medications managed--His Tegretol 200 mg TID for mood stability and Paxil 20 mg for depression were discontinued.  Haldol 10 mg BID for psychosis, Cogentin 1 mg BID for EPS prevention, Vistaril 50 mg PRN every six hours for anxiety, Lamictal 100 mg for mood stability, and Trazodone 150 mg for sleep ordered.  Ryan Bartlett's mood stabilized; he attended and participated in group therapy.  Patient denied suicidal/homicidal ideations and auditory/visual  hallucinations, follow-up appointments encouraged to attend, Rx given.  Ryan Bartlett is mentally and physically stable for discharge.  Consults:  None  Significant Diagnostic Studies:  labs: completed, reviewed, stable  Discharge Vitals:   Blood pressure 108/68, pulse 83, temperature  97.6 F (36.4 C), temperature source Oral, resp. rate 18, height 5\' 8"  (1.727 m), weight 185 lb (83.915 kg). Body mass index is 28.14 kg/(m^2). Lab Results:   No results found for this or any previous visit (from the past 72 hour(s)).  Physical Findings: AIMS: Facial and Oral Movements Muscles of Facial Expression: None, normal Lips and Perioral Area: None, normal Jaw: None, normal Tongue: None, normal,Extremity Movements Upper (arms, wrists, hands, fingers): None, normal Lower (legs, knees, ankles, toes): None, normal, Trunk Movements Neck, shoulders, hips: None, normal, Overall Severity Severity of abnormal movements (highest score from questions above): None, normal Incapacitation due to abnormal movements: None, normal Patient's awareness of abnormal movements (rate only patient's report): No Awareness, Dental Status Current problems with teeth and/or dentures?: No Does patient usually wear dentures?: No  CIWA:    COWS:     Psychiatric Specialty Exam: See Psychiatric Specialty Exam and Suicide Risk Assessment completed by Attending Physician prior to discharge.  Discharge destination:  Home  Is patient on multiple antipsychotic therapies at discharge:  No   Has Patient had three or more failed trials of antipsychotic monotherapy by history:  No  Recommended Plan for Multiple Antipsychotic Therapies: NA  Discharge Orders   Future Orders Complete By Expires   Activity as tolerated - No restrictions  As directed    Diet - low sodium heart healthy  As directed        Medication List    STOP taking these medications       carbamazepine 200 MG tablet  Commonly known as:  TEGRETOL     PARoxetine 20 MG tablet  Commonly known as:  PAXIL      TAKE these medications     Indication   benztropine 1 MG tablet  Commonly known as:  COGENTIN  Take 1 tablet (1 mg total) by mouth 2 (two) times daily.   Indication:  Extrapyramidal Reaction caused by Medications      haloperidol 10 MG tablet  Commonly known as:  HALDOL  Take 1 tablet (10 mg total) by mouth 2 (two) times daily.   Indication:  Psychosis     hydrOXYzine 50 MG tablet  Commonly known as:  ATARAX/VISTARIL  Take 1 tablet (50 mg total) by mouth every 6 (six) hours as needed for anxiety (agitation).      lamoTRIgine 100 MG tablet  Commonly known as:  LAMICTAL  Take 1 tablet (100 mg total) by mouth daily.   Indication:  Depressive Phase of Manic-Depression     traZODone 150 MG tablet  Commonly known as:  DESYREL  Take 1 tablet (150 mg total) by mouth at bedtime.   Indication:  Trouble Sleeping           Follow-up Information   Follow up with [facility] ACT team.      Follow-up recommendations:  Activity:  as tolerated Diet:  low-sodium heart healthy diet  Comments:  Patient will continue his care with his ACT team.  Total Discharge Time:  Greater than 30 minutes.  SignedNanine Means, PMH-NP 06/01/2013, 10:29 AM

## 2013-06-01 NOTE — Progress Notes (Signed)
Adult Psychoeducational Group Note  Date:  06/01/2013 Time:  11:00AM Group Topic/Focus:  Wellness Toolbox:   The focus of this group is to discuss various aspects of wellness, balancing those aspects and exploring ways to increase the ability to experience wellness.  Patients will create a wellness toolbox for use upon discharge.  Participation Level:  Active  Participation Quality:  Appropriate and Attentive  Affect:  Appropriate  Cognitive:  Alert and Appropriate  Insight: Appropriate  Engagement in Group:  Engaged  Modes of Intervention:  Discussion  Additional Comments:  Pt. Was attentive and appropriate during today's group discussion. Pt was able to complete self care assessment. Pt shared that he need to improve on his physical self care and take his medication as prescribed daily. Pt stated that he dont have a problem saying no to extra responsibilities.   Bing Plume D 06/01/2013, 1:28 PM

## 2013-06-01 NOTE — Progress Notes (Signed)
Recreation Therapy Notes  Date: 10.13.2014 Time: 9:30am Location: [address] Dayroom  Group Topic: Leisure Education  Goal Area(s) Addresses:  Patient will verbalize activity of interest by end of group session. Patient will verbalize the ability to use positive leisure/recreation as a coping mechanism.  Behavioral Response: Engaged, Appropriate  Intervention: Game  Activity: Patients were asked to select a letter of the alphabet, state an activity that starts with that letter and then identify an emotion they experience when participating in selected activity.   Education:  Leisure Education, Pharmacologist, Building control surveyor.   Education Outcome: Acknowledges understanding  Clinical Observations/Feedback: Patient actively participated in activity, stating appropriate activities, as well as emotions. Patient offered suggestions to peer as needed.   [name] Adaia Matthies, LRT/CTRS  Cresta Riden L 06/01/2013 4:07 PM

## 2013-06-01 NOTE — [facility] Suicide Risk Assessment (Signed)
[facility] INPATIENT:  Family/Significant Other Suicide Prevention Education  Suicide Prevention Education:  Contact Attempts: Milquan and Bonnielee Haff, friends, [phone],  has been identified by the patient as the family member/significant other with whom the patient will be residing, and identified as the person(s) who will aid the patient in the event of a mental health crisis.  With written consent from the patient, two attempts were made to provide suicide prevention education, prior to and/or following the patient's discharge.  We were unsuccessful in providing suicide prevention education.  A suicide education pamphlet was given to the patient to share with family/significant other.  Date and time of first attempt:05/26/13 @ 1:45PM Date and time of second attempt:05/29/13 @ 3:00PM  [name] 06/01/2013, 11:01 AM

## 2013-06-01 NOTE — Progress Notes (Addendum)
Patient ID: Ryan Bartlett, male   DOB: 21-Jul-1996, 17 y.o.   MRN: [id number] D:Patient to be discharged home today.  He plans to live with his sister.  He denies any SI/HI.  He reports that the auditory hallucinations have decreased.  He is compliant with his medications and has improved with his treatment.  Patient requested samples of nicorette gum to take home which pharmacy can provide. A: Initiate discharge paperwork and review medications with same.  15 minute checks completed every 15 minutes per protocol.  Patient has been attending groups and participating in his treatment. R: His behavior has been appropriate. 1300: patient discharged home.  His ACTT rep is picking him up.  He received all personal belongings, medication samples and prescriptions.  He denies SI/HI.  Patient had bright affect and was eager to go home to his sister's house.

## 2013-06-03 NOTE — Progress Notes (Signed)
Seen and agreed. Chuckie Mccathern, MD 

## 2013-06-03 NOTE — Discharge Summary (Signed)
Seen and agreed. Mekhi Sonn, MD 

## 2013-06-04 NOTE — Progress Notes (Signed)
Patient Discharge Instructions:  After Visit Summary (AVS):   Faxed to:  06/04/13 Discharge Summary Note:   Faxed to:  06/04/13 Psychiatric Admission Assessment Note:   Faxed to:  06/04/13 Suicide Risk Assessment - Discharge Assessment:   Faxed to:  06/04/13 Faxed/Sent to the Next Level Care provider:  06/04/13 Faxed to [facility] @ [phone]  Jerelene Redden, 06/04/2013, 3:59 PM

## 2013-06-08 ENCOUNTER — Encounter (HOSPITAL_COMMUNITY): Payer: Self-pay | Admitting: Emergency Medicine

## 2013-06-08 ENCOUNTER — Emergency Department (HOSPITAL_COMMUNITY)
Admission: EM | Admit: 2013-06-08 | Discharge: 2013-06-08 | Disposition: A | Discharge status: Home / Self Care | Payer: Medicaid Other | Attending: Emergency Medicine | Admitting: Emergency Medicine

## 2013-06-08 DIAGNOSIS — Z87828 Personal history of other (healed) physical injury and trauma: Secondary | ICD-10-CM | POA: Insufficient documentation

## 2013-06-08 DIAGNOSIS — Z79899 Other long term (current) drug therapy: Secondary | ICD-10-CM | POA: Insufficient documentation

## 2013-06-08 DIAGNOSIS — M791 Myalgia, unspecified site: Secondary | ICD-10-CM

## 2013-06-08 DIAGNOSIS — F319 Bipolar disorder, unspecified: Secondary | ICD-10-CM | POA: Insufficient documentation

## 2013-06-08 DIAGNOSIS — IMO0001 Reserved for inherently not codable concepts without codable children: Secondary | ICD-10-CM | POA: Insufficient documentation

## 2013-06-08 DIAGNOSIS — F17200 Nicotine dependence, unspecified, uncomplicated: Secondary | ICD-10-CM | POA: Insufficient documentation

## 2013-06-08 MED ORDER — IBUPROFEN 800 MG PO TABS: 800.0000 mg | ORAL_TABLET | Freq: Three times a day (TID) | ORAL | Status: DC
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
## 2013-06-08 NOTE — ED Notes (Signed)
Pt states since the end of last week his lower body has been hurting  Pt states his joints hurt esp when he walks  Pt states his joints just ache and throb

## 2013-06-12 NOTE — ED Provider Notes (Signed)
Medical screening examination/treatment/procedure(s) were performed by non-physician practitioner and as supervising physician I was immediately available for consultation/collaboration.  EKG Interpretation   None        Liandra Mendia F Geralynn Capri, MD 06/12/13 0731 

## 2013-06-30 ENCOUNTER — Emergency Department (HOSPITAL_COMMUNITY)
Admission: EM | Admit: 2013-06-30 | Discharge: 2013-07-01 | Disposition: A | Discharge status: Other Acute Inpatient Hospital | Payer: Medicaid Other | Attending: Emergency Medicine | Admitting: Emergency Medicine

## 2013-06-30 ENCOUNTER — Encounter (HOSPITAL_COMMUNITY): Payer: Self-pay | Admitting: Emergency Medicine

## 2013-06-30 DIAGNOSIS — F319 Bipolar disorder, unspecified: Secondary | ICD-10-CM | POA: Insufficient documentation

## 2013-06-30 DIAGNOSIS — R45851 Suicidal ideations: Secondary | ICD-10-CM | POA: Insufficient documentation

## 2013-06-30 DIAGNOSIS — Z79899 Other long term (current) drug therapy: Secondary | ICD-10-CM | POA: Insufficient documentation

## 2013-06-30 DIAGNOSIS — Z23 Encounter for immunization: Secondary | ICD-10-CM | POA: Insufficient documentation

## 2013-06-30 DIAGNOSIS — R443 Hallucinations, unspecified: Secondary | ICD-10-CM | POA: Insufficient documentation

## 2013-06-30 DIAGNOSIS — S61409A Unspecified open wound of unspecified hand, initial encounter: Secondary | ICD-10-CM | POA: Insufficient documentation

## 2013-06-30 DIAGNOSIS — F17200 Nicotine dependence, unspecified, uncomplicated: Secondary | ICD-10-CM | POA: Insufficient documentation

## 2013-06-30 DIAGNOSIS — X789XXA Intentional self-harm by unspecified sharp object, initial encounter: Secondary | ICD-10-CM | POA: Insufficient documentation

## 2013-06-30 LAB — RAPID URINE DRUG SCREEN, HOSP PERFORMED
Amphetamines: NOT DETECTED
Barbiturates: NOT DETECTED
Cocaine: NOT DETECTED
Opiates: NOT DETECTED
Tetrahydrocannabinol: NOT DETECTED

## 2013-06-30 LAB — CBC WITH DIFFERENTIAL/PLATELET
Basophils Absolute: 0 10*3/uL (ref 0.0–0.1)
Basophils Relative: 0 % (ref 0–1)
Eosinophils Absolute: 0.2 10*3/uL (ref 0.0–0.7)
Lymphocytes Relative: 28 % (ref 12–46)
MCH: 28.9 pg (ref 26.0–34.0)
MCHC: 35.1 g/dL (ref 30.0–36.0)
Monocytes Relative: 10 % (ref 3–12)
Neutrophils Relative %: 60 % (ref 43–77)
Platelets: 217 10*3/uL (ref 150–400)
RDW: 12.9 % (ref 11.5–15.5)

## 2013-06-30 LAB — ETHANOL: Alcohol, Ethyl (B): <11 mg/dL (ref 0–11)

## 2013-06-30 LAB — COMPREHENSIVE METABOLIC PANEL
Albumin: 4.2 g/dL (ref 3.5–5.2)
Alkaline Phosphatase: 81 U/L (ref 39–117)
BUN: 11 mg/dL (ref 6–23)
GFR calc non Af Amer: >90 mL/min (ref >90)
Potassium: 4.3 mEq/L (ref 3.5–5.1)
Total Protein: 7.5 g/dL (ref 6.0–8.3)

## 2013-06-30 MED ORDER — IBUPROFEN 200 MG PO TABS: 600.0000 mg | ORAL_TABLET | Freq: Three times a day (TID) | ORAL | Status: DC | PRN

## 2013-06-30 MED ORDER — BENZTROPINE MESYLATE 1 MG PO TABS
1.0000 mg | ORAL_TABLET | Freq: Two times a day (BID) | ORAL | Status: DC
  Administered 2013-06-30: 1 mg via ORAL
  Filled 2013-06-30: qty 1

## 2013-06-30 MED ORDER — HALOPERIDOL 5 MG PO TABS
10.0000 mg | ORAL_TABLET | Freq: Two times a day (BID) | ORAL | Status: DC
  Administered 2013-06-30: 10 mg via ORAL
  Filled 2013-06-30: qty 2

## 2013-06-30 MED ORDER — ONDANSETRON HCL 4 MG PO TABS: 4.0000 mg | ORAL_TABLET | Freq: Three times a day (TID) | ORAL | Status: DC | PRN

## 2013-06-30 MED ORDER — ACETAMINOPHEN 325 MG PO TABS: 650.0000 mg | ORAL_TABLET | ORAL | Status: DC | PRN

## 2013-06-30 MED ORDER — LORAZEPAM 1 MG PO TABS: 1.0000 mg | ORAL_TABLET | Freq: Three times a day (TID) | ORAL | Status: DC | PRN

## 2013-06-30 MED ORDER — LAMOTRIGINE 100 MG PO TABS
100.0000 mg | ORAL_TABLET | Freq: Every day | ORAL | Status: DC
  Administered 2013-06-30: 100 mg via ORAL
  Filled 2013-06-30: qty 1

## 2013-06-30 MED ORDER — TRAZODONE HCL 50 MG PO TABS
150.0000 mg | ORAL_TABLET | Freq: Every day | ORAL | Status: DC
  Administered 2013-06-30: 150 mg via ORAL
  Filled 2013-06-30 (×2): qty 1

## 2013-06-30 MED ORDER — HYDROXYZINE HCL 25 MG PO TABS: 50.0000 mg | ORAL_TABLET | Freq: Four times a day (QID) | ORAL | Status: DC | PRN

## 2013-06-30 MED ORDER — TETANUS-DIPHTH-ACELL PERTUSSIS 5-2.5-18.5 LF-MCG/0.5 IM SUSP
0.5000 mL | Freq: Once | INTRAMUSCULAR | Status: AC
  Administered 2013-06-30: 0.5 mL via INTRAMUSCULAR
  Filled 2013-06-30: qty 0.5

## 2013-06-30 NOTE — ED Notes (Signed)
Per EMS- patient was picked up from [facility].  Patient is requesting medical clearance and reports that he is feeling suicidal. Patient cut a 2 cm cut to his right lateral hand stating that he was punishing himself. Patient reports that he has been non compliant with his medications.

## 2013-06-30 NOTE — ED Notes (Signed)
Patient's wound is covered. I cleaned with sterile saline, bacitracin placed on wound. Patient has sterile 2x2, and tegaderm placed.

## 2013-06-30 NOTE — [facility] Counselor (Signed)
Writer left voicemail to TTS re: TTS consult called in from [facility] for pt.  Evette Cristal, [place] Assessment Counselor

## 2013-06-30 NOTE — Progress Notes (Signed)
Patient confirms he does not have a pcp.  [facility] provided patient with a list of pcps who accept Medicaid insurance in [place].  Patient thankful for resources.

## 2013-06-30 NOTE — ED Notes (Signed)
Patient reports that he is having auditory hallucinations-"howling". Patient denies visual hallucinations. Patient states he is non compliant with his meds because he feels like they do not work. Patient states he is suicidal and his plan is to keep cutting himself, but denies HI. Patient states he has attempted suicide in the past by overdosing and hanging himself which was a month ago.

## 2013-06-30 NOTE — ED Notes (Signed)
Patient pulled dressing off hand

## 2013-06-30 NOTE — ED Provider Notes (Signed)
CSN: [id number]     Arrival date & time 06/30/13  1309 History  This chart was scribed for non-physician practitioner working with Dagmar Hait, MD by Ashley Jacobs, ED scribe. This patient was seen in room WTR3/WLPT3 and the patient's care was started at 2:25 PM.   First MD Initiated Contact with Patient 06/30/13 1422     Chief Complaint  Patient presents with  . Medical Clearance   (Consider location/radiation/quality/duration/timing/severity/associated sxs/prior Treatment) The history is provided by the patient and medical records. No language interpreter was used.   HPI Comments: Ryan Bartlett is a 17 y.o. male who presents to the Emergency Department via EMS for Medical Clearance for suicidal ideation. Pt reports having SI since he was 17 years old, but has recently been thinking of a plan.   He explains that he is experiencing auditory hallucination of hearing "howling" sounds. He is taking Cogentin, Haldol, Atarax, Desyrell and Lamictal regularly. Yesterday he states he cut his R lateral palm with a razor blade as a form of self-punishment. He is unsure of his last tetnus shot. Pt denies the use of recreational drugs and but admits drinking alcohol every other week. He denies HI. Pt has a past medical hx of ADHD, unspecified episodic mood, oppositional defiant disorder, bipolar depression and depression.    Past Medical History  Diagnosis Date  . ADHD (attention deficit hyperactivity disorder)   . Unspecified episodic mood disorder   . Oppositional defiant disorder   . Bipolar disorder   . Depression    Past Surgical History  Procedure Laterality Date  . No past surgeries     Family History  Problem Relation Age of Onset  . Depression Mother   . Diabetes Other   . Hyperlipidemia Other   . Hypertension Other    History  Substance Use Topics  . Smoking status: Current Every Day Smoker -- 0.50 packs/day for 5 years    Types: Cigarettes  . Smokeless tobacco:  Never Used  . Alcohol Use: 3.6 oz/week    6 Cans of beer per week     Comment: occassionally    Review of Systems  Psychiatric/Behavioral: Positive for suicidal ideas, hallucinations and self-injury.  All other systems reviewed and are negative.    Allergies  Peanut-containing drug products and Lactose intolerance (gi)  Home Medications   Current Outpatient Rx  Name  Route  Sig  Dispense  Refill  . benztropine (COGENTIN) 1 MG tablet   Oral   Take 1 tablet (1 mg total) by mouth 2 (two) times daily.   30 tablet   0   . haloperidol (HALDOL) 10 MG tablet   Oral   Take 1 tablet (10 mg total) by mouth 2 (two) times daily.   60 tablet   0   . haloperidol lactate (HALDOL) 5 MG/ML injection   Subcutaneous   Inject into the skin every 30 (thirty) days.         . hydrOXYzine (ATARAX/VISTARIL) 50 MG tablet   Oral   Take 1 tablet (50 mg total) by mouth every 6 (six) hours as needed for anxiety (agitation).   30 tablet   0   . lamoTRIgine (LAMICTAL) 100 MG tablet   Oral   Take 1 tablet (100 mg total) by mouth daily.   30 tablet   0   . traZODone (DESYREL) 150 MG tablet   Oral   Take 1 tablet (150 mg total) by mouth at bedtime.   30 tablet  0    BP 141/78  Pulse 88  Temp(Src) 98.5 F (36.9 C) (Oral)  Resp 18  SpO2 97% Physical Exam  Nursing note and vitals reviewed. Constitutional: He is oriented to person, place, and time. He appears well-developed and well-nourished. No distress.  HENT:  Head: Normocephalic.  Eyes: EOM are normal. Pupils are equal, round, and reactive to light.  Neck: Normal range of motion. Neck supple. No tracheal deviation present.  Cardiovascular: Normal rate, regular rhythm and normal heart sounds.   Pulmonary/Chest: Effort normal and breath sounds normal. No respiratory distress.  Abdominal: Soft. He exhibits no distension.  Musculoskeletal: Normal range of motion.  Neurological: He is alert and oriented to person, place, and time.   Skin: Skin is warm and dry.   1.5 linear supericial laceration to the palmer surface of the right hand just proximal to the 5 th phalanx.    Psychiatric: His speech is normal and behavior is normal. He exhibits a depressed mood. He expresses suicidal ideation. He expresses no homicidal ideation. He expresses suicidal plans. He expresses no homicidal plans.    ED Course  Procedures (including critical care time) DIAGNOSTIC STUDIES: Oxygen Saturation is 97% on room air, normal by my interpretation.    COORDINATION OF CARE: 2:29 PM Discussed course of care with pt which includes T-DAP injection and laboratory screenings/tests. Pt understands and agrees.  Labs Review Labs Reviewed  CBC WITH DIFFERENTIAL  URINE RAPID DRUG SCREEN (HOSP PERFORMED)  COMPREHENSIVE METABOLIC PANEL  ETHANOL   Imaging Review No results found.  EKG Interpretation   None       MDM  No diagnosis found. Patient presenting with suicidal thoughts with plan of cutting himself.  Patient with a superficial laceration to the palmar surface of his hand.  Tetanus updated.  Laceration cleaned and dressing applied.  Laceration is superficial and has been present for >24 hours.  Therefore, do not feel that sutures are indicated at this time.  TTS consulted.  Labs in the ED unremarkable.  Psych holding orders have been placed.  I personally performed the services described in this documentation, which was scribed in my presence. The recorded information has been reviewed and is accurate.    Santiago Glad, PA-C 06/30/13 [phone]

## 2013-06-30 NOTE — BH Assessment (Signed)
Assessment Note  Ryan Bartlett is an 17 y.o. male who presents to the ED with SI. Pt reports that he has a history of depression and SI and has tried to kill himself " a lot of times, like 20 or 30".  Pt reports that he "just woke up this morning and didn't feel right and just didn't feel like being here anymore".  Pt reports that he has previously attempted suicide by cutting, overdosing, and hanging.  Pt reports that he is currently suicidal and has a plan to "just keep cutting and bleeding".  Pt can not contract for safety.  Pt reports that he has access to more razor blades and knives.   Pt reports that he cuts himself "to relieve stress" at least twice a week".   Pt denies HI and VH.  Pt reports that he hears "dogs howling" and that it has been going on for "a few months".   Pt reports no change in appetite but reports that he only gets "about 2 hours of sleep per night". Pt reports feeling depressed and "staying at home, moping around, and not getting out of bed".   Pt reports that he currently takes Lamictal, Haldol, Trazadone, Viseral, and Cogentin.  Pt reports that he has been taking his meds but doesn't feel like they are working.  Pt reports he drinks beer "about once a week".   Pt reports inpatient treatment at [facility], [facility], and [facility].  Pt was unclear about when he was inpatient.  Pt has an ACTT with [facility].    CSW recommends inpatient treatment for patient for stabilization and medication evaluation.  Axis I: Major Depression, Recurrent severe Axis II: Deferred Axis III:  Past Medical History  Diagnosis Date  . ADHD (attention deficit hyperactivity disorder)   . Unspecified episodic mood disorder   . Oppositional defiant disorder   . Bipolar disorder   . Depression    Axis IV: other psychosocial or environmental problems and problems related to social environment Axis V: 31-40 impairment in reality testing  Past Medical History:  Past Medical History   Diagnosis Date  . ADHD (attention deficit hyperactivity disorder)   . Unspecified episodic mood disorder   . Oppositional defiant disorder   . Bipolar disorder   . Depression     Past Surgical History  Procedure Laterality Date  . No past surgeries      Family History:  Family History  Problem Relation Age of Onset  . Depression Mother   . Diabetes Other   . Hyperlipidemia Other   . Hypertension Other     Social History:  reports that he has been smoking Cigarettes.  He has a 2.5 pack-year smoking history. He has never used smokeless tobacco. He reports that he drinks about 3.6 ounces of alcohol per week. He reports that he does not use illicit drugs.  Additional Social History:     CIWA: CIWA-Ar BP: 141/78 mmHg Pulse Rate: 88 COWS:    Allergies:  Allergies  Allergen Reactions  . Peanut-Containing Drug Products Anaphylaxis  . Lactose Intolerance (Gi) Diarrhea and Nausea And Vomiting    Home Medications:  (Not in a hospital admission)  OB/GYN Status:  No LMP for male patient.  General Assessment Data Location of Assessment: [facility] ED Is this a Tele or Face-to-Face Assessment?: Face-to-Face Is this an Initial Assessment or a Re-assessment for this encounter?: Initial Assessment Living Arrangements: Parent Can pt return to current living arrangement?: Yes Admission Status: Voluntary  Is patient capable of signing voluntary admission?: Yes Transfer from: Home Referral Source: Self/Family/Friend     [facility] Crisis Care Plan Living Arrangements: Parent     Risk to self Suicidal Ideation: Yes-Currently Present Suicidal Intent: Yes-Currently Present Is patient at risk for suicide?: Yes Suicidal Plan?: Yes-Currently Present Specify Current Suicidal Plan: cut his wrist Access to Means: Yes Specify Access to Suicidal Means: razor blades (razor blades) What has been your use of drugs/alcohol within the last 12 months?: alcohol Previous Attempts/Gestures: Yes How  many times?: 20 (pt reports he can't remember 20-30 times ) Other Self Harm Risks: cutting Triggers for Past Attempts: Unpredictable Intentional Self Injurious Behavior: Cutting Comment - Self Injurious Behavior:  (pt reports cuts to relieve stress) Family Suicide History: No Recent stressful life event(s):  (none) Persecutory voices/beliefs?: No Depression: Yes Depression Symptoms: Insomnia;Isolating;Loss of interest in usual pleasures Substance abuse history and/or treatment for substance abuse?: No  Risk to Others Homicidal Ideation: No Thoughts of Harm to Others: No Current Homicidal Intent: No Current Homicidal Plan: No Access to Homicidal Means: No Identified Victim: none History of harm to others?: No Assessment of Violence: None Noted Violent Behavior Description: calm Does patient have access to weapons?: Yes (Comment) (knives and razorblades) Criminal Charges Pending?: No Does patient have a court date: No  Psychosis Hallucinations: Auditory Delusions: None noted  Mental Status Report Appear/Hygiene: Disheveled;Poor hygiene Eye Contact: Poor Motor Activity: Freedom of movement Speech: Logical/coherent;Soft Level of Consciousness: Quiet/awake Mood: Depressed Affect: Blunted;Depressed;Sad Anxiety Level: None Thought Processes: Coherent;Relevant Judgement: Unimpaired Orientation: Person;Place;Time;Situation;Appropriate for developmental age Obsessive Compulsive Thoughts/Behaviors: None  Cognitive Functioning Concentration: Decreased Memory: Recent Intact;Remote Impaired IQ: Average Insight: Fair Impulse Control: Poor Appetite: Fair Weight Loss: 0 Weight Gain: 0 Sleep: Decreased Total Hours of Sleep: 2 Vegetative Symptoms: Staying in bed;Not bathing;Decreased grooming  ADLScreening [facility] Assessment Services) Patient's cognitive ability adequate to safely complete daily activities?: Yes Patient able to express need for assistance with ADLs?:  Yes Independently performs ADLs?: Yes (appropriate for developmental age)  Prior Inpatient Therapy Prior Inpatient Therapy: Yes Prior Therapy Dates: 2014 and earlier (pt had hard time remember when inpatient) Prior Therapy Facilty/Provider(s): [facility] Reason for Treatment: SI  Prior Outpatient Therapy Prior Outpatient Therapy: Yes Prior Therapy Dates: ongoing Prior Therapy Facilty/Provider(s): [facility] Reason for Treatment: ACTT  ADL Screening (condition at time of admission) Patient's cognitive ability adequate to safely complete daily activities?: Yes Patient able to express need for assistance with ADLs?: Yes Independently performs ADLs?: Yes (appropriate for developmental age)         Values / Beliefs Cultural Requests During Hospitalization: None Spiritual Requests During Hospitalization: None        Additional Information 1:1 In Past 12 Months?: No CIRT Risk: No Elopement Risk: No Does patient have medical clearance?: Yes     Disposition:  Disposition Initial Assessment Completed for this Encounter: Yes Disposition of Patient: Inpatient treatment program Type of inpatient treatment program: Adult Other disposition(s): Other (Comment) (pending consult with psych)  On Site Evaluation by:   Reviewed with Physician:    Lexine Baton 06/30/2013 5:47 PM

## 2013-07-01 ENCOUNTER — Encounter (HOSPITAL_COMMUNITY): Payer: Self-pay | Admitting: *Deleted

## 2013-07-01 ENCOUNTER — Inpatient Hospital Stay (HOSPITAL_COMMUNITY)
Admission: EM | Admit: 2013-07-01 | Discharge: 2013-07-03 | DRG: 885 | Disposition: A | Discharge status: Home / Self Care | Payer: Medicaid Other | Source: Intra-hospital | Attending: Psychiatry | Admitting: Psychiatry

## 2013-07-01 DIAGNOSIS — R45851 Suicidal ideations: Secondary | ICD-10-CM

## 2013-07-01 DIAGNOSIS — F1210 Cannabis abuse, uncomplicated: Secondary | ICD-10-CM

## 2013-07-01 DIAGNOSIS — F909 Attention-deficit hyperactivity disorder, unspecified type: Secondary | ICD-10-CM | POA: Diagnosis present

## 2013-07-01 DIAGNOSIS — F913 Oppositional defiant disorder: Secondary | ICD-10-CM | POA: Diagnosis present

## 2013-07-01 DIAGNOSIS — F1994 Other psychoactive substance use, unspecified with psychoactive substance-induced mood disorder: Secondary | ICD-10-CM

## 2013-07-01 DIAGNOSIS — F912 Conduct disorder, adolescent-onset type: Secondary | ICD-10-CM

## 2013-07-01 DIAGNOSIS — F3130 Bipolar disorder, current episode depressed, mild or moderate severity, unspecified: Secondary | ICD-10-CM

## 2013-07-01 DIAGNOSIS — F902 Attention-deficit hyperactivity disorder, combined type: Secondary | ICD-10-CM

## 2013-07-01 DIAGNOSIS — F314 Bipolar disorder, current episode depressed, severe, without psychotic features: Principal | ICD-10-CM

## 2013-07-01 DIAGNOSIS — F919 Conduct disorder, unspecified: Secondary | ICD-10-CM

## 2013-07-01 DIAGNOSIS — Z79899 Other long term (current) drug therapy: Secondary | ICD-10-CM

## 2013-07-01 MED ORDER — ALUM & MAG HYDROXIDE-SIMETH 200-200-20 MG/5ML PO SUSP: 30.0000 mL | ORAL | Status: DC | PRN

## 2013-07-01 MED ORDER — TRAZODONE HCL 100 MG PO TABS
100.0000 mg | ORAL_TABLET | Freq: Every day | ORAL | Status: DC
Start: 2013-07-01 — End: 2013-07-03
  Administered 2013-07-01 – 2013-07-02 (×2): 100 mg via ORAL
  Filled 2013-07-01 (×4): qty 1

## 2013-07-01 MED ORDER — BENZTROPINE MESYLATE 1 MG PO TABS
1.0000 mg | ORAL_TABLET | Freq: Two times a day (BID) | ORAL | Status: DC
  Administered 2013-07-01 – 2013-07-03 (×5): 1 mg via ORAL
  Filled 2013-07-01 (×8): qty 1

## 2013-07-01 MED ORDER — ACETAMINOPHEN 325 MG PO TABS
650.0000 mg | ORAL_TABLET | Freq: Four times a day (QID) | ORAL | Status: DC | PRN
Start: 2013-07-01 — End: 2013-07-03
  Administered 2013-07-02: 650 mg via ORAL
  Filled 2013-07-01: qty 2

## 2013-07-01 MED ORDER — TRAZODONE HCL 150 MG PO TABS
150.0000 mg | ORAL_TABLET | Freq: Every day | ORAL | Status: DC
  Filled 2013-07-01 (×2): qty 1

## 2013-07-01 MED ORDER — LAMOTRIGINE 100 MG PO TABS
100.0000 mg | ORAL_TABLET | Freq: Every day | ORAL | Status: DC
  Administered 2013-07-01 – 2013-07-03 (×3): 100 mg via ORAL
  Filled 2013-07-01 (×5): qty 1

## 2013-07-01 MED ORDER — HALOPERIDOL 5 MG PO TABS
10.0000 mg | ORAL_TABLET | Freq: Two times a day (BID) | ORAL | Status: DC
  Administered 2013-07-01 – 2013-07-03 (×5): 10 mg via ORAL
  Filled 2013-07-01 (×8): qty 2

## 2013-07-01 MED ORDER — HYDROXYZINE HCL 50 MG PO TABS
50.0000 mg | ORAL_TABLET | Freq: Four times a day (QID) | ORAL | Status: DC | PRN
  Administered 2013-07-02: 50 mg via ORAL
  Filled 2013-07-01: qty 6
  Filled 2013-07-01: qty 1

## 2013-07-01 MED ORDER — MAGNESIUM HYDROXIDE 400 MG/5ML PO SUSP: 30.0000 mL | Freq: Every day | ORAL | Status: DC | PRN

## 2013-07-01 NOTE — H&P (Signed)
Psychiatric Admission Assessment Adult  Patient Identification:  Ryan Bartlett Date of Evaluation:  07/01/2013 Chief Complaint:  MDD  Mood Disorder History of Present Illness: Ryan Bartlett is an 17 y.o. male admitted voluntarily and emergently from [facility] long ED with increased symptoms of depression and suicidal ideation. Patient has a history of depression and multiple suicide attempts about 20 or 30". Patient reports that he "just woke up this morning and didn't feel right and just didn't feel like being here anymore". He has previously attempted suicide by cutting, overdosing, and hanging. He is currently suicidal and has a plan to "just keep cutting and bleeding". Patient can not contract for safety and required acute inpatient psychiatric hospitalization for crisis stabilization and safety monitoring. He has access to more razor blades and knives. He cuts himself "to relieve stress" at least twice a week". Patient denies HI and VH. He hears "dogs howling" and that it has been going on for "a few months". Patient has decreased need for sleep but no change in his appetite. Patient reports feeling depressed and "staying at home, moping around, and not getting out of bed". Patient reports that he currently takes Lamictal, Haldol, Trazadone, Vistaril and Cogentin. Reportedly he has been taking his meds but doesn't feel like they are working. Patient reports he drinks beer "about once a week". Patient has multiple acute psychiatric inpatient treatment at [facility], [facility], and [facility]. Patient has an ACTT with [facility]. Patient stated that he stopped taking his medication because they're making him sleepy during daytime. Patient also reported he has been attending [facility]).  Elements:  Location:   Psychiatric Hospital unit. Quality:  Depression. Severity:  Suicidal ideation. Timing:  Self-injurious behaviors. Duration:  4 weeks. Context:  Dangerous to  himself. Associated Signs/Synptoms: Depression Symptoms:  depressed mood, anhedonia, insomnia, psychomotor agitation, feelings of worthlessness/guilt, hopelessness, recurrent thoughts of death, suicidal attempt, loss of energy/fatigue, disturbed sleep, decreased labido, decreased appetite, (Hypo) Manic Symptoms:  Distractibility, Impulsivity, Irritable Mood, Labiality of Mood, Anxiety Symptoms:  Excessive Worry, Psychotic Symptoms:  Paranoia, PTSD Symptoms: NA  Psychiatric Specialty Exam: Physical Exam  ROS  Blood pressure 89/54, pulse 92, temperature 98 F (36.7 C), temperature source Oral, resp. rate 16, height 5\' 8"  (1.727 m), weight 88.451 kg (195 lb).Body mass index is 29.66 kg/(m^2).  General Appearance: Disheveled and Guarded  Eye Contact::  Minimal  Speech:  Clear and Coherent and Slow  Volume:  Decreased  Mood:  Anxious, Depressed, Hopeless and Worthless  Affect:  Depressed and Flat  Thought Process:  Goal Directed and Intact  Orientation:  Full (Time, Place, and Person)  Thought Content:  Rumination  Suicidal Thoughts:  Yes.  with intent/plan  Homicidal Thoughts:  No  Memory:  Immediate;   Fair  Judgement:  Impaired  Insight:  Lacking  Psychomotor Activity:  Psychomotor Retardation  Concentration:  Fair  Recall:  Fair  Akathisia:  NA  Handed:  Right  AIMS (if indicated):     Assets:  Communication Skills Desire for Improvement Housing Intimacy Physical Health Resilience Social Support Transportation  Sleep:  Number of Hours: 3    Past Psychiatric History: Diagnosis: Bipolar I disorder, ADHD and conduct disorder   Hospitalizations: Multiple   Outpatient Care:[facility]    Substance Abuse Care: Cannabis   Self-Mutilation: Yes   Suicidal Attempts: Yes   Violent Behaviors: Anger outbursts    Past Medical History:   Past Medical History  Diagnosis  Date  . ADHD (attention deficit hyperactivity disorder)   . Unspecified  episodic mood disorder   . Oppositional defiant disorder   . Bipolar disorder   . Depression    None. Allergies:   Allergies  Allergen Reactions  . Peanut-Containing Drug Products Anaphylaxis  . Lactose Intolerance (Gi) Diarrhea and Nausea And Vomiting   PTA Medications: Prescriptions prior to admission  Medication Sig Dispense Refill  . benztropine (COGENTIN) 1 MG tablet Take 1 tablet (1 mg total) by mouth 2 (two) times daily.  30 tablet  0  . haloperidol (HALDOL) 10 MG tablet Take 1 tablet (10 mg total) by mouth 2 (two) times daily.  60 tablet  0  . haloperidol lactate (HALDOL) 5 MG/ML injection Inject into the skin every 30 (thirty) days.      . hydrOXYzine (ATARAX/VISTARIL) 50 MG tablet Take 1 tablet (50 mg total) by mouth every 6 (six) hours as needed for anxiety (agitation).  30 tablet  0  . lamoTRIgine (LAMICTAL) 100 MG tablet Take 1 tablet (100 mg total) by mouth daily.  30 tablet  0  . traZODone (DESYREL) 150 MG tablet Take 1 tablet (150 mg total) by mouth at bedtime.  30 tablet  0    Previous Psychotropic Medications:  Medication/Dose  Lamictal   Haldol   Cogentin           Substance Abuse History in the last 12 months:  yes  Consequences of Substance Abuse: NA  Social History:  reports that he has been smoking Cigarettes.  He has a 2.5 pack-year smoking history. He has never used smokeless tobacco. He reports that he drinks about 3.6 ounces of alcohol per week. He reports that he does not use illicit drugs. Additional Social History:                      Current Place of Residence:   Place of Birth:   Family Members: Marital Status:  Single Children:  Sons:  Daughters: Relationships: Education:  GED Educational Problems/Performance: Religious Beliefs/Practices: History of Abuse (Emotional/Phsycial/Sexual) Teacher, music History:  None. Legal History: Hobbies/Interests:  Family History:   Family History  Problem  Relation Age of Onset  . Depression Mother   . Diabetes Other   . Hyperlipidemia Other   . Hypertension Other     Results for orders placed during the hospital encounter of 06/30/13 (from the past 72 hour(s))  URINE RAPID DRUG SCREEN (HOSP PERFORMED)     Status: None   Collection Time    06/30/13  1:34 PM      Result Value Range   Opiates NONE DETECTED  NONE DETECTED   Cocaine NONE DETECTED  NONE DETECTED   Benzodiazepines NONE DETECTED  NONE DETECTED   Amphetamines NONE DETECTED  NONE DETECTED   Tetrahydrocannabinol NONE DETECTED  NONE DETECTED   Barbiturates NONE DETECTED  NONE DETECTED   Comment:            DRUG SCREEN FOR MEDICAL PURPOSES     ONLY.  IF CONFIRMATION IS NEEDED     FOR ANY PURPOSE, NOTIFY LAB     WITHIN 5 DAYS.                LOWEST DETECTABLE LIMITS     FOR URINE DRUG SCREEN     Drug Class       Cutoff (ng/mL)     Amphetamine      1000     Barbiturate  200     Benzodiazepine   200     Tricyclics       300     Opiates          300     Cocaine          300     THC              50  CBC WITH DIFFERENTIAL     Status: None   Collection Time    06/30/13  1:48 PM      Result Value Range   WBC 7.5  4.0 - 10.5 K/uL   RBC 5.15  4.22 - 5.81 MIL/uL   Hemoglobin 14.9  13.0 - 17.0 g/dL   HCT 19.1  47.8 - 29.5 %   MCV 82.3  78.0 - 100.0 fL   MCH 28.9  26.0 - 34.0 pg   MCHC 35.1  30.0 - 36.0 g/dL   RDW 62.1  30.8 - 65.7 %   Platelets 217  150 - 400 K/uL   Neutrophils Relative % 60  43 - 77 %   Neutro Abs 4.5  1.7 - 7.7 K/uL   Lymphocytes Relative 28  12 - 46 %   Lymphs Abs 2.1  0.7 - 4.0 K/uL   Monocytes Relative 10  3 - 12 %   Monocytes Absolute 0.7  0.1 - 1.0 K/uL   Eosinophils Relative 2  0 - 5 %   Eosinophils Absolute 0.2  0.0 - 0.7 K/uL   Basophils Relative 0  0 - 1 %   Basophils Absolute 0.0  0.0 - 0.1 K/uL  COMPREHENSIVE METABOLIC PANEL     Status: Abnormal   Collection Time    06/30/13  1:48 PM      Result Value Range   Sodium 137  135 - 145  mEq/L   Potassium 4.3  3.5 - 5.1 mEq/L   Chloride 103  96 - 112 mEq/L   CO2 26  19 - 32 mEq/L   Glucose, Bld 95  70 - 99 mg/dL   BUN 11  6 - 23 mg/dL   Creatinine, Ser 8.46  0.50 - 1.35 mg/dL   Calcium 9.5  8.4 - 96.2 mg/dL   Total Protein 7.5  6.0 - 8.3 g/dL   Albumin 4.2  3.5 - 5.2 g/dL   AST 23  0 - 37 U/L   ALT 26  0 - 53 U/L   Alkaline Phosphatase 81  39 - 117 U/L   Total Bilirubin 0.2 (*) 0.3 - 1.2 mg/dL   GFR calc non Af Amer >90  >90 mL/min   GFR calc Af Amer >90  >90 mL/min   Comment: (NOTE)     The eGFR has been calculated using the CKD EPI equation.     This calculation has not been validated in all clinical situations.     eGFR's persistently <90 mL/min signify possible Chronic Kidney     Disease.  ETHANOL     Status: None   Collection Time    06/30/13  1:48 PM      Result Value Range   Alcohol, Ethyl (B) <11  0 - 11 mg/dL   Comment:            LOWEST DETECTABLE LIMIT FOR     SERUM ALCOHOL IS 11 mg/dL     FOR MEDICAL PURPOSES ONLY   Psychological Evaluations:  Assessment:   DSM5:  Schizophrenia Disorders:   Obsessive-Compulsive  Disorders:   Trauma-Stressor Disorders:   Substance/Addictive Disorders:   Depressive Disorders:  Major Depressive Disorder - Severe (296.23)  AXIS I:  ADHD, combined type, Bipolar, Depressed, Conduct Disorder, Substance Induced Mood Disorder and Cannabis abuse AXIS II:  Cluster B Traits AXIS III:   Past Medical History  Diagnosis Date  . ADHD (attention deficit hyperactivity disorder)   . Unspecified episodic mood disorder   . Oppositional defiant disorder   . Bipolar disorder   . Depression    AXIS IV:  other psychosocial or environmental problems, problems related to social environment and problems with primary support group AXIS V:  41-50 serious symptoms  Treatment Plan/Recommendations:  Admitted for crisis stabilization, safety monitoring and medication management  Treatment Plan Summary: Daily contact with patient  to assess and evaluate symptoms and progress in treatment Medication management Current Medications:  Current Facility-Administered Medications  Medication Dose Route Frequency Provider Last Rate Last Dose  . acetaminophen (TYLENOL) tablet 650 mg  650 mg Oral Q6H PRN Kerry Hough, PA-C      . alum & mag hydroxide-simeth (MAALOX/MYLANTA) 200-200-20 MG/5ML suspension 30 mL  30 mL Oral Q4H PRN Kerry Hough, PA-C      . benztropine (COGENTIN) tablet 1 mg  1 mg Oral BID Kerry Hough, PA-C   1 mg at 07/01/13 1610  . haloperidol (HALDOL) tablet 10 mg  10 mg Oral BID Kerry Hough, PA-C   10 mg at 07/01/13 9604  . hydrOXYzine (ATARAX/VISTARIL) tablet 50 mg  50 mg Oral Q6H PRN Kerry Hough, PA-C      . lamoTRIgine (LAMICTAL) tablet 100 mg  100 mg Oral Daily Kerry Hough, PA-C   100 mg at 07/01/13 0759  . magnesium hydroxide (MILK OF MAGNESIA) suspension 30 mL  30 mL Oral Daily PRN Kerry Hough, PA-C      . traZODone (DESYREL) tablet 150 mg  150 mg Oral QHS Kerry Hough, PA-C        Observation Level/Precautions:  15 minute checks  Laboratory:  Reviewed admission labs  Psychotherapy:  Individual, group therapy and milieu therapy   Medications:  Decreased trazodone to 100 mg because of increased sleepiness during daytime and continue Lamictal 100 mg daily Haldol 10 mg twice daily and Cogentin 1 mg twice daily.   Consultations:  None   Discharge Concerns:  Safety and self injurious behaviors   Estimated LOS: 4-7 days   Other:     I certify that inpatient services furnished can reasonably be expected to improve the patient's condition.   Makenzy Krist,JANARDHAHA R. 11/12/20141:48 PM

## 2013-07-01 NOTE — Progress Notes (Signed)
Adult Psychoeducational Group Note  Date:  07/01/2013 Time:  10:00am Group Topic/Focus:  Therapeutic Activity  Participation Level:  Did Not Attend  Participation Quality:    Affect:    Cognitive:    Insight:   Engagement in Group:      Additional Comments:  Pt did not attend group  Pryor Curia 07/01/2013, 1:54 PM

## 2013-07-01 NOTE — ED Provider Notes (Signed)
Medical screening examination/treatment/procedure(s) were performed by non-physician practitioner and as supervising physician I was immediately available for consultation/collaboration.  EKG Interpretation   None         Dagmar Hait, MD 07/01/13 1506

## 2013-07-01 NOTE — Progress Notes (Signed)
This is a 17 years old [place] American male admitted for suicide attempt. He reported feeling depressed and hopeless and that he woke up yesterday, felt very stressed and decided to cut his palm to release stress. Patient stated ; "I don't feel I'm good enough for my family, everybody makes good grades and I dropped out of School due to poor grades". He also reported; "my little sisters call me stupid".  He denied any substance abuse but endorsed ocassional alcohol use. He endorsed multiple history of suicide attempts. He appeared sad during the assessment , but very polite and cooperative. Writer encouraged and supportive to the patient. Oriented patient to the unit and Q 15 minute check initiated.

## 2013-07-01 NOTE — BHH Group Notes (Signed)
[facility] [name] Aftercare Discharge Planning Group Note   07/01/2013 12:35 PM  Participation Quality:  Did not attend group.   Corrie Reder, Joesph July

## 2013-07-01 NOTE — Tx Team (Signed)
Interdisciplinary Treatment Plan Update   Date Reviewed:  07/01/2013  Time Reviewed:  9:42 AM  Progress in Treatment:   Attending groups: Yes Participating in groups: Yes Taking medication as prescribed: Yes  Tolerating medication: Yes Family/Significant other contact made: No, but will ask patient for consent for collateral contact Patient understands diagnosis: Yes  Discussing patient identified problems/goals with staff: Yes Medical problems stabilized or resolved: Yes Denies suicidal/homicidal ideation: Yes Patient has not harmed self or others: Yes  For review of initial/current patient goals, please see plan of care.  Estimated Length of Stay:  3-5 days  Reasons for Continued Hospitalization:  Anxiety Depression Medication stabilization Suicidal ideation  New Problems/Goals identified:    Discharge Plan or Barriers:   Home with outpatient follow up to be determined  Additional Comments:  Ryan Bartlett is an 17 y.o. male who presents to the ED with SI. Pt reports that he has a history of depression and SI and has tried to kill himself " a lot of times, like 20 or 30". Pt reports that he "just woke up this morning and didn't feel right and just didn't feel like being here anymore". Pt reports that he has previously attempted suicide by cutting, overdosing, and hanging. Pt reports that he is currently suicidal and has a plan to "just keep cutting and bleeding". Pt can not contract for safety. Pt reports that he has access to more razor blades and knives. Pt reports that he cuts himself "to relieve stress" at least twice a week". Pt denies HI and VH. Pt reports that he hears "dogs howling" and that it has been going on for "a few months". Pt reports no change in appetite but reports that he only gets "about 2 hours of sleep per night". Pt reports feeling depressed and "staying at home, moping around, and not getting out of bed". Pt reports that he currently takes Lamictal, Haldol,  Trazadone, Viseral, and Cogentin. Pt reports that he has been taking his meds but doesn't feel like they are working.  Attendees:  Patient:  07/01/2013 9:42 AM   Signature: Mervyn Gay, MD 07/01/2013 9:42 AM  Signature:  Verne Spurr, PA 07/01/2013 9:42 AM  Signature: Harold Barban, RN 07/01/2013 9:42 AM  Signature: Marzetta Board, RN 07/01/2013 9:42 AM  Signature:  07/01/2013 9:42 AM  Signature:  Juline Patch, [name] 07/01/2013 9:42 AM  Signature:  Reyes Ivan, [name] 07/01/2013 9:42 AM  Signature:  Maseta Dorley,Care Coordinator 07/01/2013 9:42 AM  Signature:   07/01/2013 9:42 AM  Signature: Leighton Parody, RN 07/01/2013  9:42 AM  Signature:   07/01/2013  9:42 AM  Signature:  07/01/2013  9:42 AM    Scribe for Treatment Team:   Juline Patch,  07/01/2013 9:42 AM

## 2013-07-01 NOTE — ED Provider Notes (Signed)
12:21 AM Pt admitted to [facility], [facility] accepting. I was not directly involved in pt disposition or MSE.  1. Suicidal ideation      Shanna Cisco, MD 07/01/13 [phone]

## 2013-07-01 NOTE — BHH Counselor (Signed)
Adult Psychosocial Assessment Update Interdisciplinary Team  Previous Behavior Health Hospital admissions/discharges:  Admissions Discharges  Date:  05/22/13 Date:  06/01/13  Date: Date:  Date: Date:  Date: Date:  Date: Date:   Changes since the last Psychosocial Assessment (including adherence to outpatient mental health and/or substance abuse treatment, situational issues contributing to decompensation and/or relapse). Patient reports increased depression.  He advised of being a cutter and having SI. He advised of planning to continue cutting until he died.             Discharge Plan 1. Will you be returning to the same living situation after discharge?   Yes: No:      If no, what is your plan?    Yes, patient will return home with his mother.       2. Would you like a referral for services when you are discharged? Yes:     If yes, for what services?  No:       No.  Patient is followed by [facility] ACT Team.       Summary and Recommendations (to be completed by the evaluator) Dashon Mcintire is a 17 year old African American male admitted with Major Depression Disorder.  He will benefit from crisis stabilization, evaluation for medication, psycho-education groups for coping skills development, group therapy and case management for discharge planning.                        Signature:  Wynn Banker, 07/01/2013 12:22 PM

## 2013-07-01 NOTE — BHH Group Notes (Signed)
[facility] [name] Group Therapy  Emotional Regulation 1:15 - 2: 30 PM        07/01/2013    Type of Therapy:  Group Therapy  Participation Level: Did not attend group.  Wynn Banker 07/01/2013

## 2013-07-01 NOTE — Tx Team (Signed)
Initial Interdisciplinary Treatment Plan  PATIENT STRENGTHS: (choose at least two) Motivation for treatment/growth Physical Health Supportive family/friends  PATIENT STRESSORS: Educational concerns   PROBLEM LIST: Problem List/Patient Goals Date to be addressed Date deferred Reason deferred Estimated date of resolution  Depression 07/01/13                                                      DISCHARGE CRITERIA:  Improved stabilization in mood, thinking, and/or behavior Motivation to continue treatment in a less acute level of care Verbal commitment to aftercare and medication compliance  PRELIMINARY DISCHARGE PLAN: Attend 12-step recovery group Outpatient therapy Return to previous living arrangement  PATIENT/FAMIILY INVOLVEMENT: This treatment plan has been presented to and reviewed with the patient, Ryan Bartlett, and/or family member,  The patient and family have been given the opportunity to ask questions and make suggestions.  Roselie Skinner [facility] 07/01/2013, 2:57 AM

## 2013-07-01 NOTE — Progress Notes (Signed)
D: Patient presents with a flat affect and depressed mood. Declined self inventory sheet. Writer has observed patient lying in bed all day. He's not attending any groups throughout the day. Patient compliant with medication regimen.  A: Support and encouragement provided to patient. Scheduled medications administered per MD orders. Maintain Q15 minute checks for safety.  R: Patient receptive. Denies SI/HI and auditory and visual hallucinations. Patient remains safe.

## 2013-07-01 NOTE — Progress Notes (Signed)
Recreation Therapy Notes  Date: 11.12.2014 Time: 3:00pm Location: [address] Dayroom   Group Topic: Communication, Team Building, Problem Solving  Goal Area(s) Addresses:  Patient will effectively work with peer towards shared goal.  Patient will identify skill used to make activity successful.  Patient will identify how skills used during activity can be used to reach post d/c goals.   Behavioral Response: Did not attend  [name], LRT/CTRS  [name] 07/01/2013 5:08 PM

## 2013-07-01 NOTE — Progress Notes (Signed)
Pt did not attend wrap up group. He says he was too tired even after staff encouraged him to come and that it would not take too long.

## 2013-07-01 NOTE — BHH Suicide Risk Assessment (Signed)
Suicide Risk Assessment  Admission Assessment     Nursing information obtained from:  Patient Demographic factors:  Male Current Mental Status:  NA Loss Factors:  NA Historical Factors:  Prior suicide attempts Risk Reduction Factors:  Living with another person, especially a relative  CLINICAL FACTORS:   Severe Anxiety and/or Agitation Bipolar Disorder:   Depressive phase Depression:   Anhedonia Hopelessness Impulsivity Insomnia Recent sense of peace/wellbeing Severe Alcohol/Substance Abuse/Dependencies Personality Disorders:   Cluster B More than one psychiatric diagnosis Unstable or Poor Therapeutic Relationship Previous Psychiatric Diagnoses and Treatments Medical Diagnoses and Treatments/Surgeries  COGNITIVE FEATURES THAT CONTRIBUTE TO RISK:  Polarized thinking    SUICIDE RISK:   Moderate:  Frequent suicidal ideation with limited intensity, and duration, some specificity in terms of plans, no associated intent, good self-control, limited dysphoria/symptomatology, some risk factors present, and identifiable protective factors, including available and accessible social support.  PLAN OF CARE: Admitted for crisis stabilization, safety monitoring, therapeutic interventions, case management and medication management.  I certify that inpatient services furnished can reasonably be expected to improve the patient's condition.  Nehemiah Settle., M.D. 07/01/2013, 1:46 PM

## 2013-07-01 NOTE — ED Notes (Signed)
Patient discharge via ambulatory with steady gait. No acute distress noted. 

## 2013-07-02 DIAGNOSIS — F912 Conduct disorder, adolescent-onset type: Secondary | ICD-10-CM

## 2013-07-02 DIAGNOSIS — F314 Bipolar disorder, current episode depressed, severe, without psychotic features: Principal | ICD-10-CM

## 2013-07-02 NOTE — Progress Notes (Signed)
Pt reports feeling good today with no concerns. He is denying any psychosocial symptoms. This includes no SI/HI/AVH. He is pleasant and cooperative. He actively participates within the milieu.   A: Writer administered scheduled and prn medications to pt. Continued support and availability as needed was extended to this pt. Staff continue to monitor pt with q31min checks.  R: No adverse drug reactions noted. Pt receptive to treatment. Pt remains safe at this time.

## 2013-07-02 NOTE — Progress Notes (Signed)
Adult Psychoeducational Group Note  Date:  07/02/2013 Time:  2:11 PM  Group Topic/Focus:  Self Esteem Action Plan:   The focus of this group is to help patients create a plan to continue to build self-esteem after discharge.  Participation Level:  None  Participation Quality:  Attentive and Resistant  Affect:  Flat  Cognitive:  Alert  Insight: Lacking  Engagement in Group:  Lacking  Modes of Intervention:  Activity, Discussion, Exploration, Socialization and Support  Additional Comments:  Pt came to group and refused to share for most of the group.  Pt completed a self-esteem action plan and discussed several activities and strategies to help develop a positive self-esteem. Pt was encouraged throughout the group to share the answers to the questions and get feedback and support from the group. Pt discussed positive things about themselves and made positive goals for the future.   Cathlean Cower 07/02/2013, 2:11 PM

## 2013-07-02 NOTE — BHH Suicide Risk Assessment (Signed)
[facility] INPATIENT:  Family/Significant Other Suicide Prevention Education  Suicide Prevention Education:  Patient Refusal for Family/Significant Other Suicide Prevention Education: The patient Ryan Bartlett has refused to provide written consent for family/significant other to be provided Family/Significant Other Suicide Prevention Education during admission and/or prior to discharge.  Physician notified.  Patient advised he does not want to involve family in the hospital admission.  Wynn Banker 07/02/2013, 9:07 AM

## 2013-07-02 NOTE — Progress Notes (Signed)
Morning Wellness Group  9:00 AM  The focus of this group is to educate the patient on the purpose and policies of crisis stabilization and provide a format to answer questions about their admission.  The group details unit policies and expectations of patients while admitted.  Patient did not attend this group. Patient was lying in bed at this time.

## 2013-07-02 NOTE — Progress Notes (Signed)
D: Pt mood is depressed. He has spent most of this shift in the bed complaining of fatigue. He states that tomorrow he should be well rested enough to attend all groups and participate. Pt has been cooperative. A: Support given. Verbalization encouraged. Medications given as prescribed. Pt encouraged to come to nurse with any concerns.  R: Pt is receptive. No complaints of pain or discomfort at this time. Q15 min safety checks maintained. Will continue to monitor pt.

## 2013-07-02 NOTE — Progress Notes (Signed)
[facility] MD Progress Note  07/02/2013 12:24 PM Ryan Bartlett  MRN:  [id number] Subjective:  Ryan Bartlett is up and moving around the unit. He states he is much better and feels that he is ready to return home. He states his mother said he could return to her home. He states he is doing well, has no new problems and really does feel much better. Diagnosis:   DSM5: ADL's:  Intact  Sleep: Good  Appetite:  Good  Suicidal Ideation:  denies Homicidal Ideation:  denies AEB (as evidenced by):  Psychiatric Specialty Exam: ROS  Blood pressure 105/70, pulse 84, temperature 97.8 F (36.6 C), temperature source Oral, resp. rate 16, height 5\' 8"  (1.727 m), weight 88.451 kg (195 lb).Body mass index is 29.66 kg/(m^2).  General Appearance: Disheveled  Eye Contact::  Good  Speech:  Clear and Coherent  Volume:  Normal  Mood:  Anxious  Affect:  Congruent  Thought Process:  Goal Directed  Orientation:  Full (Time, Place, and Person)  Thought Content:  WDL  Suicidal Thoughts:  No  Homicidal Thoughts:  No  Memory:  Immediate;   Poor  Judgement:  Impaired  Insight:  Lacking  Psychomotor Activity:  Normal  Concentration:  Fair  Recall:  Fair  Akathisia:  No  Handed:  Right  AIMS (if indicated):     Assets:  Communication Skills Desire for Improvement Physical Health  Sleep:  Number of Hours: 6.5   Current Medications: Current Facility-Administered Medications  Medication Dose Route Frequency Provider Last Rate Last Dose  . acetaminophen (TYLENOL) tablet 650 mg  650 mg Oral Q6H PRN Kerry Hough, PA-C      . alum & mag hydroxide-simeth (MAALOX/MYLANTA) 200-200-20 MG/5ML suspension 30 mL  30 mL Oral Q4H PRN Kerry Hough, PA-C      . benztropine (COGENTIN) tablet 1 mg  1 mg Oral BID Kerry Hough, PA-C   1 mg at 07/02/13 4098  . haloperidol (HALDOL) tablet 10 mg  10 mg Oral BID Kerry Hough, PA-C   10 mg at 07/02/13 1191  . hydrOXYzine (ATARAX/VISTARIL) tablet 50 mg  50 mg Oral Q6H PRN  Kerry Hough, PA-C      . lamoTRIgine (LAMICTAL) tablet 100 mg  100 mg Oral Daily Nehemiah Settle, MD   100 mg at 07/02/13 0751  . magnesium hydroxide (MILK OF MAGNESIA) suspension 30 mL  30 mL Oral Daily PRN Kerry Hough, PA-C      . traZODone (DESYREL) tablet 100 mg  100 mg Oral QHS Nehemiah Settle, MD   100 mg at 07/01/13 2136    Lab Results:  Results for orders placed during the hospital encounter of 06/30/13 (from the past 48 hour(s))  URINE RAPID DRUG SCREEN (HOSP PERFORMED)     Status: None   Collection Time    06/30/13  1:34 PM      Result Value Range   Opiates NONE DETECTED  NONE DETECTED   Cocaine NONE DETECTED  NONE DETECTED   Benzodiazepines NONE DETECTED  NONE DETECTED   Amphetamines NONE DETECTED  NONE DETECTED   Tetrahydrocannabinol NONE DETECTED  NONE DETECTED   Barbiturates NONE DETECTED  NONE DETECTED   Comment:            DRUG SCREEN FOR MEDICAL PURPOSES     ONLY.  IF CONFIRMATION IS NEEDED     FOR ANY PURPOSE, NOTIFY LAB     WITHIN 5 DAYS.  LOWEST DETECTABLE LIMITS     FOR URINE DRUG SCREEN     Drug Class       Cutoff (ng/mL)     Amphetamine      1000     Barbiturate      200     Benzodiazepine   200     Tricyclics       300     Opiates          300     Cocaine          300     THC              50  CBC WITH DIFFERENTIAL     Status: None   Collection Time    06/30/13  1:48 PM      Result Value Range   WBC 7.5  4.0 - 10.5 K/uL   RBC 5.15  4.22 - 5.81 MIL/uL   Hemoglobin 14.9  13.0 - 17.0 g/dL   HCT 16.1  09.6 - 04.5 %   MCV 82.3  78.0 - 100.0 fL   MCH 28.9  26.0 - 34.0 pg   MCHC 35.1  30.0 - 36.0 g/dL   RDW 40.9  81.1 - 91.4 %   Platelets 217  150 - 400 K/uL   Neutrophils Relative % 60  43 - 77 %   Neutro Abs 4.5  1.7 - 7.7 K/uL   Lymphocytes Relative 28  12 - 46 %   Lymphs Abs 2.1  0.7 - 4.0 K/uL   Monocytes Relative 10  3 - 12 %   Monocytes Absolute 0.7  0.1 - 1.0 K/uL   Eosinophils Relative 2  0 - 5 %    Eosinophils Absolute 0.2  0.0 - 0.7 K/uL   Basophils Relative 0  0 - 1 %   Basophils Absolute 0.0  0.0 - 0.1 K/uL  COMPREHENSIVE METABOLIC PANEL     Status: Abnormal   Collection Time    06/30/13  1:48 PM      Result Value Range   Sodium 137  135 - 145 mEq/L   Potassium 4.3  3.5 - 5.1 mEq/L   Chloride 103  96 - 112 mEq/L   CO2 26  19 - 32 mEq/L   Glucose, Bld 95  70 - 99 mg/dL   BUN 11  6 - 23 mg/dL   Creatinine, Ser 7.82  0.50 - 1.35 mg/dL   Calcium 9.5  8.4 - 95.6 mg/dL   Total Protein 7.5  6.0 - 8.3 g/dL   Albumin 4.2  3.5 - 5.2 g/dL   AST 23  0 - 37 U/L   ALT 26  0 - 53 U/L   Alkaline Phosphatase 81  39 - 117 U/L   Total Bilirubin 0.2 (*) 0.3 - 1.2 mg/dL   GFR calc non Af Amer >90  >90 mL/min   GFR calc Af Amer >90  >90 mL/min   Comment: (NOTE)     The eGFR has been calculated using the CKD EPI equation.     This calculation has not been validated in all clinical situations.     eGFR's persistently <90 mL/min signify possible Chronic Kidney     Disease.  ETHANOL     Status: None   Collection Time    06/30/13  1:48 PM      Result Value Range   Alcohol, Ethyl (B) <11  0 - 11 mg/dL   Comment:  LOWEST DETECTABLE LIMIT FOR     SERUM ALCOHOL IS 11 mg/dL     FOR MEDICAL PURPOSES ONLY    Physical Findings: AIMS: Facial and Oral Movements Muscles of Facial Expression: None, normal Lips and Perioral Area: None, normal Jaw: None, normal Tongue: None, normal,Extremity Movements Upper (arms, wrists, hands, fingers): None, normal Lower (legs, knees, ankles, toes): None, normal, Trunk Movements Neck, shoulders, hips: None, normal, Overall Severity Severity of abnormal movements (highest score from questions above): None, normal Incapacitation due to abnormal movements: None, normal Patient's awareness of abnormal movements (rate only patient's report): No Awareness, Dental Status Current problems with teeth and/or dentures?: No Does patient usually wear dentures?:  No  CIWA:    COWS:     Treatment Plan Summary: Daily contact with patient to assess and evaluate symptoms and progress in treatment Medication management  Plan: 1. Continue crisis management and stabilization. 2. Medication management to reduce current symptoms to base line and improve patient's overall level of functioning 3. Treat health problems as indicated. 4. Develop treatment plan to decrease risk of relapse upon discharge and the need for readmission. 5. Psycho-social education regarding relapse prevention and self care. 6. Health care follow up as needed for medical problems. 7. Continue home medications where appropriate. 8. Case Manager will need to contact his mother to confirm that [place] has a place to go. 9. Could D/C home Friday if mother is ok with this.  Medical Decision Making Problem Points:  Established problem, stable/improving (1) Data Points:  Review or order medicine tests (1)  I certify that inpatient services furnished can reasonably be expected to improve the patient's condition.   Rona Ravens. Mashburn RPAC 9:18 PM 07/02/2013  Reviewed the information documented and agree with the treatment plan.  Daylan Juhnke,JANARDHAHA R. 07/04/2013 11:12 AM

## 2013-07-02 NOTE — Progress Notes (Signed)
D: Patient's affect and mood is appropriate to circumstance. He rated depression, anxiety and feelings of hopelessness "0". Patient continues to lye in bed and not attend any of the scheduled groups. He's tolerating medications well.  A: Support and encouragement provided to patient. Administered scheduled medications per ordering MD. Monitor Q15 minute checks for safety.  R: Patient receptive. Denies SI/HI. Patient remains safe on the unit.

## 2013-07-02 NOTE — Progress Notes (Signed)
Adult Psychoeducational Group Note  Date:  07/02/2013 Time:  8:00PM Group Topic/Focus:  Karaoke  Participation Level:  Active  Participation Quality:  Appropriate and Attentive  Affect:    Cognitive:  Alert and Appropriate  Insight: Appropriate  Engagement in Group:  Engaged  Modes of Intervention:  Socialization  Additional Comments:  Pt. Was appropriate and attentive during tonight's group. Pt attended Karaoke.  Ryan Bartlett 07/02/2013, 8:49 PM

## 2013-07-02 NOTE — Progress Notes (Signed)
Recreation Therapy Notes  Date: 11.13.2014 Time: 2:45pm Location: [address] Dayroom  Group Topic: Animal Assisted Activities (AAA)  Behavioral Response: Engaged, Attentive, Appropriate   Affect: Euthymic  Clinical Observations/Feedback: Dog Team: Slate & handler. Patient interacted appropriately with peer, dog team, and LRT.   Delitha Elms L Jalesha Plotz, LRT/CTRS  Pryor Guettler L 07/02/2013 4:52 PM 

## 2013-07-02 NOTE — [facility] Group Notes (Signed)
.  [facility] [name] Group Therapy  Living A Balanced Life  1:15 - 2: 30          07/02/2013  4:13 PM    Type of Therapy:  Group Therapy  Participation Level:  Appropriate  Participation Quality:  Appropriate  Affect:  Appropriate  Cognitive:  Attentive Appropriate  Insight: Developing/Improving Engaged  Engagement in Therapy:  Developing/ImprovingEngaged  Modes of Intervention:  Discussion Exploration Problem-Solving Supportive.   Summary of Progress/Problems: The topic for group was living a balanced life.  Patient actively engaged in discussion and was able to identify ways life is out of balance.  Patient able to share that he helps his mother out around the house but spends most of his time with friend.  Patient also stated he is in a gang and that sometimes it is good but things can go bad quickly.    Wynn Banker 07/02/2013  4:13 PM

## 2013-07-03 MED ORDER — BENZTROPINE MESYLATE 1 MG PO TABS: 1.0000 mg | ORAL_TABLET | Freq: Two times a day (BID) | ORAL | Status: DC

## 2013-07-03 MED ORDER — HALOPERIDOL LACTATE 5 MG/ML IJ SOLN: 1.0000 mg | INTRAMUSCULAR | Status: DC

## 2013-07-03 MED ORDER — HYDROXYZINE HCL 50 MG PO TABS: 50.0000 mg | ORAL_TABLET | Freq: Four times a day (QID) | ORAL | Status: DC | PRN

## 2013-07-03 MED ORDER — HALOPERIDOL 10 MG PO TABS: 10.0000 mg | ORAL_TABLET | Freq: Two times a day (BID) | ORAL | Status: DC

## 2013-07-03 MED ORDER — TRAZODONE HCL 100 MG PO TABS: 100.0000 mg | ORAL_TABLET | Freq: Every day | ORAL | Status: DC

## 2013-07-03 MED ORDER — LAMOTRIGINE 100 MG PO TABS: 100.0000 mg | ORAL_TABLET | Freq: Every day | ORAL | Status: DC

## 2013-07-03 NOTE — Tx Team (Signed)
Interdisciplinary Treatment Plan Update   Date Reviewed:  07/03/2013  Time Reviewed:  9:39 AM  Progress in Treatment:   Attending groups: Yes Participating in groups: Yes Taking medication as prescribed: Yes  Tolerating medication: Yes Family/Significant other contact made: No, patient declined collateral contact. Patient understands diagnosis: Yes  Discussing patient identified problems/goals with staff: Yes Medical problems stabilized or resolved: Yes Denies suicidal/homicidal ideation: Yes Patient has not harmed self or others: Yes  For review of initial/current patient goals, please see plan of care.  Estimated Length of Stay:  Discharge today.  Reasons for Continued Hospitalization:   New Problems/Goals identified:    Discharge Plan or Barriers:   Home with outpatient follow up with [facility]  Additional Comments:  N/A  Attendees:  Patient:  Ryan Bartlett 07/03/2013 9:39 AM   Signature: Mervyn Gay, MD 07/03/2013 9:39 AM  Signature:  Verne Spurr, PA 07/03/2013 9:39 AM  Signature: Harold Barban, RN 07/03/2013 9:39 AM  Signature:  Onnie Boer, RN 07/03/2013 9:39 AM  Signature:  07/03/2013 9:39 AM  Signature:  Juline Patch, [name] 07/03/2013 9:39 AM  Signature:  Reyes Ivan, [name] 07/03/2013 9:39 AM  Signature:  Maseta Dorley,Care Coordinator 07/03/2013 9:39 AM  Signature:   07/03/2013 9:39 AM  Signature: Leighton Parody, RN 07/03/2013  9:39 AM  Signature:   07/03/2013  9:39 AM  Signature:  07/03/2013  9:39 AM    Scribe for Treatment Team:   Juline Patch,  07/03/2013 9:39 AM

## 2013-07-03 NOTE — Discharge Summary (Signed)
Physician Discharge Summary Note  Patient:  Ryan Bartlett is an 17 y.o., male MRN:  [id number] DOB:  [birth year] Patient phone:  [phone] (home)  Patient address:   [address] [place] [address],   Date of Admission:  07/01/2013 Date of Discharge: 07/03/2013  Reason for Admission:  Depression with suicidal ideation  Discharge Diagnoses: Principal Problem:   Suicidal ideation Active Problems:   ADHD (attention deficit hyperactivity disorder), combined type   Conduct disorder, adolescent onset type   Cannabis abuse   Bipolar I disorder, most recent episode (or current) depressed, severe, without mention of psychotic behavior  ROS  DSM5: Schizophrenia Disorders:  Obsessive-Compulsive Disorders:  Trauma-Stressor Disorders:  Substance/Addictive Disorders:  Depressive Disorders: Major Depressive Disorder - Severe (296.23)  AXIS I: ADHD, combined type, Bipolar, Depressed, Conduct Disorder, Substance Induced Mood Disorder and Cannabis abuse  AXIS II: Cluster B Traits  AXIS III:  Past Medical History   Diagnosis  Date   .  ADHD (attention deficit hyperactivity disorder)    .  Unspecified episodic mood disorder    .  Oppositional defiant disorder    .  Bipolar disorder    .  Depression     AXIS IV: other psychosocial or environmental problems, problems related to social environment and problems with primary support group  AXIS V: 41-50 serious symptoms   Level of Care:  OP  Hospital Course:  Ryan Bartlett is a 17 year old [name] with a long history of mental illness including ADHD, ODD, Bipolar disorder with depression. He presented to the [facility] via EMS whom he called upon waking with suicidal ideation and the idea of planning to commit suicide. He has a history of cutting but reported cutting his R palm with a razor blade the day before.  He reported waking with SI and auditory hallucinations despite being compliant with his medications, using drugs, or recent alcohol  intake.      He was evaluated and accepted to [facility] for further stabilization and treatment. He was evaluated by a psychiatrist upon admission to the unit and medication management was initiated.        Ryan Bartlett was also encouraged to participate in unit programming and groups. His behavior was appropriate while on the unit and he did not need a 1:1 during this admission. He was cooperative with staff and respectful of the other patients.       Ryan Bartlett responded well to a therapeutic environment and quickly noted a good reduction in symptoms. He was ready for discharge with plans to return home and follow up with his outpatient provider.       On the day of discharge he denied any SI/HI or AVH, He was in much improved condition than upon arrival and was discharged home as noted below.  Consults:  None  Significant Diagnostic Studies:  labs: CBC, CMP, UA. UDS  Discharge Vitals:   Blood pressure 103/63, pulse 90, temperature 97.6 F (36.4 C), temperature source Oral, resp. rate 18, height 5\' 8"  (1.727 m), weight 88.451 kg (195 lb). Body mass index is 29.66 kg/(m^2). Lab Results:   No results found for this or any previous visit (from the past 72 hour(s)).  Physical Findings: AIMS: Facial and Oral Movements Muscles of Facial Expression: None, normal Lips and Perioral Area: None, normal Jaw: None, normal Tongue: None, normal,Extremity Movements Upper (arms, wrists, hands, fingers): None, normal Lower (legs, knees, ankles, toes): None, normal, Trunk Movements Neck, shoulders, hips: None, normal, Overall Severity Severity  of abnormal movements (highest score from questions above): None, normal Incapacitation due to abnormal movements: None, normal Patient's awareness of abnormal movements (rate only patient's report): No Awareness, Dental Status Current problems with teeth and/or dentures?: No Does patient usually wear dentures?: No  CIWA:    COWS:     Psychiatric Specialty Exam: See  Psychiatric Specialty Exam and Suicide Risk Assessment completed by Attending Physician prior to discharge.  Discharge destination:  Home  Is patient on multiple antipsychotic therapies at discharge:  No   Has Patient had three or more failed trials of antipsychotic monotherapy by history:  No  Recommended Plan for Multiple Antipsychotic Therapies: NA  Discharge Orders   Future Orders Complete By Expires   Diet - low sodium heart healthy  As directed    Discharge instructions  As directed    Comments:     Take all of your medications as directed. Be sure to keep all of your follow up appointments.  If you are unable to keep your follow up appointment, call your Doctor's office to let them know, and reschedule.  Make sure that you have enough medication to last until your appointment. Be sure to get plenty of rest. Going to bed at the same time each night will help. Try to avoid sleeping during the day.  Increase your activity as tolerated. Regular exercise will help you to sleep better and improve your mental health. Eating a heart healthy diet is recommended. Try to avoid salty or fried foods. Be sure to avoid all alcohol and illegal drugs.   Increase activity slowly  As directed        Medication List       Indication   benztropine 1 MG tablet  Commonly known as:  COGENTIN  Take 1 tablet (1 mg total) by mouth 2 (two) times daily.   Indication:  Extrapyramidal Reaction caused by Medications     haloperidol 10 MG tablet  Commonly known as:  HALDOL  Take 1 tablet (10 mg total) by mouth 2 (two) times daily.   Indication:  Psychosis     haloperidol lactate 5 MG/ML injection  Commonly known as:  HALDOL  Inject 0.2 mLs (1 mg total) into the muscle every 30 (thirty) days. For psychosis.   Indication:  Psychosis     hydrOXYzine 50 MG tablet  Commonly known as:  ATARAX/VISTARIL  Take 1 tablet (50 mg total) by mouth every 6 (six) hours as needed for anxiety (agitation).    Indication:  Anxiety Neurosis     lamoTRIgine 100 MG tablet  Commonly known as:  LAMICTAL  Take 1 tablet (100 mg total) by mouth daily. For mood stabilization.   Indication:  Depressive Phase of Manic-Depression     traZODone 100 MG tablet  Commonly known as:  DESYREL  Take 1 tablet (100 mg total) by mouth at bedtime. For insomnia.   Indication:  Trouble Sleeping           Follow-up Information   Follow up with [facility]ning [facility] On 07/03/2013. (Friday, July 03, 2013 at discharge.)    Contact information:   [address] [place], [place]   [address]  [phone]        Follow-up recommendations:   Activities: Resume activity as tolerated. Diet: Heart healthy low sodium diet Tests: Follow up testing will be determined by your out patient provider. Comments:   Total Discharge Time:  Less than 30 minutes.  Signed: Rona Ravens. Mashburn RPAC 6:48  PM 07/04/2013  Patient is seen and personally for psychiatric evaluation, suicide risk assessment, case discussed with physician extender and made discharge treatment plan. Reviewed the information documented and agree with the treatment plan.  Zadin Lange,JANARDHAHA R. 07/06/2013 9:01 AM

## 2013-07-03 NOTE — Progress Notes (Signed)
[facility] Adult Case Management Discharge Plan :  Will you be returning to the same living situation after discharge: Yes, patient discharging to mother's home. At discharge, do you have transportation home?:Yes,  Patient to be transported by his ACT Team Do you have the ability to pay for your medications:Yes,  Patient has Medicaid.  Release of information consent forms completed and in the chart;  Patient's signature needed at discharge.  Patient to Follow up at: Follow-up Information   Follow up with Isid[facility] Donning [facility] On 07/03/2013. (Friday, July 03, 2013 at discharge.)    Contact information:   [address] [place], [place]   [address]  [phone]        Patient denies SI/HI:   Patient no longer endorsing SI/HI or other thoughts of self harm.   Safety Planning and Suicide Prevention discussed: .Reviewed with all patients during discharge planning group  Kalei Meda, Joesph July 07/03/2013, 10:44 AM

## 2013-07-03 NOTE — [facility] Group Notes (Signed)
[facility] [name] Aftercare Discharge Planning Group Note   07/03/2013 10:43 AM    Participation Quality:  Appropraite  Mood/Affect:  Appropriate  Depression Rating:  0  Anxiety Rating:  0  Thoughts of Suicide:  No  Will you contract for safety?   NA  Current AVH:  No  Plan for Discharge/Comments:  Patient attending discharge planning group and actively participated in group.  He reports doing well and will follow up with [facility] on discharge.  CSW provided all participants with daily workbook.   Transportation Means: Patient has transportation.   Supports:  Patient has a support system.   Mairin Lindsley, Joesph July

## 2013-07-03 NOTE — [facility] Suicide Risk Assessment (Signed)
Suicide Risk Assessment  Discharge Assessment     Demographic Factors:  Male, Adolescent or young adult, Low socioeconomic status, Living alone and Unemployed  Mental Status Per Nursing Assessment::   On Admission:  NA  Current Mental Status by Physician: Mental Status Examination: Patient appeared as per his stated age, casually dressed, and fairly groomed, and maintaining good eye contact. Patient has good mood and his affect was constricted. He has normal rate, rhythm, and volume of speech. His thought process is linear and goal directed. Patient has denied suicidal, homicidal ideations, intentions or plans. Patient has no evidence of auditory or visual hallucinations, delusions, and paranoia. Patient has fair insight judgment and impulse control.  Loss Factors: Financial problems/change in socioeconomic status  Historical Factors: Prior suicide attempts, Family history of mental illness or substance abuse and Impulsivity  Risk Reduction Factors:   Sense of responsibility to family, Religious beliefs about death, Positive social support, Positive therapeutic relationship and Positive coping skills or problem solving skills  Continued Clinical Symptoms:  Bipolar Disorder:   Depressive phase Unstable or Poor Therapeutic Relationship Previous Psychiatric Diagnoses and Treatments  Cognitive Features That Contribute To Risk:  Polarized thinking    Suicide Risk:  Minimal: No identifiable suicidal ideation.  Patients presenting with no risk factors but with morbid ruminations; may be classified as minimal risk based on the severity of the depressive symptoms  Discharge Diagnoses:   AXIS I:  ADHD, combined type, Bipolar, Depressed and Conduct Disorder AXIS II:  Deferred AXIS III:   Past Medical History  Diagnosis Date  . ADHD (attention deficit hyperactivity disorder)   . Unspecified episodic mood disorder   . Oppositional defiant disorder   . Bipolar disorder   . Depression     AXIS IV:  economic problems, occupational problems, other psychosocial or environmental problems, problems related to social environment and problems with primary support group AXIS V:  51-60 moderate symptoms  Plan Of Care/Follow-up recommendations:  Activity:  As tolerated Diet:  Regular  Is patient on multiple antipsychotic therapies at discharge:  No   Has Patient had three or more failed trials of antipsychotic monotherapy by history:  No  Recommended Plan for Multiple Antipsychotic Therapies: NA  Ryan Bartlett,Ryan R. 07/03/2013, 12:34 PM

## 2013-07-03 NOTE — Progress Notes (Signed)
Discharge Note: Discharge instructions/prescriptions/medication samples given to patient. Patient verbalized understanding of discharge instructions and prescriptions. Returned belongings to patient. Denies SI/HI/AVH. Patient d/c without incident to the lobby and transported home by mother. 

## 2013-07-08 NOTE — Progress Notes (Signed)
Patient Discharge Instructions:  After Visit Summary (AVS):   Faxed to:  07/08/13 Discharge Summary Note:   Faxed to:  07/08/13 Psychiatric Admission Assessment Note:   Faxed to:  07/08/13 Suicide Risk Assessment - Discharge Assessment:   Faxed to:  07/08/13 Faxed/Sent to the Next Level Care provider:  07/08/13 Faxed to [facility] @ [phone]  Jerelene Redden, 07/08/2013, 4:21 PM

## 2013-08-18 ENCOUNTER — Encounter (HOSPITAL_COMMUNITY): Payer: Self-pay | Admitting: *Deleted

## 2013-08-18 ENCOUNTER — Encounter (HOSPITAL_COMMUNITY): Payer: Self-pay | Admitting: Emergency Medicine

## 2013-08-18 ENCOUNTER — Ambulatory Visit (HOSPITAL_COMMUNITY)
Admission: RE | Admit: 2013-08-18 | Discharge: 2013-08-18 | Disposition: A | Discharge status: Home / Self Care | Payer: Medicaid Other | Attending: Psychiatry | Admitting: Psychiatry

## 2013-08-18 ENCOUNTER — Emergency Department (HOSPITAL_COMMUNITY)
Admission: EM | Admit: 2013-08-18 | Discharge: 2013-08-19 | Disposition: A | Discharge status: Other Acute Inpatient Hospital | Payer: Medicaid Other | Attending: Emergency Medicine | Admitting: Emergency Medicine

## 2013-08-18 DIAGNOSIS — F314 Bipolar disorder, current episode depressed, severe, without psychotic features: Secondary | ICD-10-CM

## 2013-08-18 DIAGNOSIS — F912 Conduct disorder, adolescent-onset type: Secondary | ICD-10-CM

## 2013-08-18 DIAGNOSIS — Z79899 Other long term (current) drug therapy: Secondary | ICD-10-CM | POA: Insufficient documentation

## 2013-08-18 DIAGNOSIS — F3130 Bipolar disorder, current episode depressed, mild or moderate severity, unspecified: Secondary | ICD-10-CM | POA: Insufficient documentation

## 2013-08-18 DIAGNOSIS — F913 Oppositional defiant disorder: Secondary | ICD-10-CM | POA: Insufficient documentation

## 2013-08-18 DIAGNOSIS — F902 Attention-deficit hyperactivity disorder, combined type: Secondary | ICD-10-CM

## 2013-08-18 DIAGNOSIS — F17200 Nicotine dependence, unspecified, uncomplicated: Secondary | ICD-10-CM | POA: Insufficient documentation

## 2013-08-18 DIAGNOSIS — R45851 Suicidal ideations: Secondary | ICD-10-CM | POA: Insufficient documentation

## 2013-08-18 DIAGNOSIS — R05 Cough: Secondary | ICD-10-CM | POA: Insufficient documentation

## 2013-08-18 DIAGNOSIS — R059 Cough, unspecified: Secondary | ICD-10-CM | POA: Insufficient documentation

## 2013-08-18 LAB — COMPREHENSIVE METABOLIC PANEL
ALT: 18 U/L (ref 0–53)
AST: 15 U/L (ref 0–37)
Albumin: 4.3 g/dL (ref 3.5–5.2)
Alkaline Phosphatase: 67 U/L (ref 39–117)
BUN: 14 mg/dL (ref 6–23)
CO2: 25 mEq/L (ref 19–32)
Calcium: 9.9 mg/dL (ref 8.4–10.5)
Chloride: 98 mEq/L (ref 96–112)
GFR calc Af Amer: >90 mL/min (ref >90)
GFR calc non Af Amer: >90 mL/min (ref >90)
Glucose, Bld: 100 mg/dL — ABNORMAL HIGH (ref 70–99)
Potassium: 3.8 mEq/L (ref 3.7–5.3)
Sodium: 136 mEq/L — ABNORMAL LOW (ref 137–147)

## 2013-08-18 LAB — RAPID URINE DRUG SCREEN, HOSP PERFORMED
Amphetamines: NOT DETECTED
Barbiturates: NOT DETECTED
Opiates: NOT DETECTED
Tetrahydrocannabinol: NOT DETECTED

## 2013-08-18 LAB — CBC
Hemoglobin: 15 g/dL (ref 13.0–17.0)
MCH: 28.5 pg (ref 26.0–34.0)
MCHC: 35.1 g/dL (ref 30.0–36.0)
Platelets: 224 10*3/uL (ref 150–400)
RBC: 5.26 MIL/uL (ref 4.22–5.81)
WBC: 8.1 10*3/uL (ref 4.0–10.5)

## 2013-08-18 LAB — ACETAMINOPHEN LEVEL: Acetaminophen (Tylenol), Serum: <15 ug/mL (ref 10–30)

## 2013-08-18 MED ORDER — NICOTINE 14 MG/24HR TD PT24
14.0000 mg | MEDICATED_PATCH | Freq: Every day | TRANSDERMAL | Status: DC
  Administered 2013-08-18 – 2013-08-19 (×2): 14 mg via TRANSDERMAL
  Filled 2013-08-18 (×2): qty 1

## 2013-08-18 MED ORDER — HALOPERIDOL 5 MG PO TABS
10.0000 mg | ORAL_TABLET | Freq: Two times a day (BID) | ORAL | Status: DC
  Administered 2013-08-18 – 2013-08-19 (×2): 10 mg via ORAL
  Filled 2013-08-18 (×2): qty 2

## 2013-08-18 MED ORDER — TRAZODONE HCL 100 MG PO TABS
100.0000 mg | ORAL_TABLET | Freq: Every day | ORAL | Status: DC
  Administered 2013-08-18: 100 mg via ORAL
  Filled 2013-08-18: qty 1

## 2013-08-18 MED ORDER — BENZTROPINE MESYLATE 1 MG PO TABS
1.0000 mg | ORAL_TABLET | Freq: Two times a day (BID) | ORAL | Status: DC
  Administered 2013-08-18 – 2013-08-19 (×2): 1 mg via ORAL
  Filled 2013-08-18 (×2): qty 1

## 2013-08-18 MED ORDER — HYDROXYZINE HCL 25 MG PO TABS: 50.0000 mg | ORAL_TABLET | Freq: Four times a day (QID) | ORAL | Status: DC | PRN

## 2013-08-18 MED ORDER — LAMOTRIGINE 100 MG PO TABS
100.0000 mg | ORAL_TABLET | Freq: Every day | ORAL | Status: DC
  Administered 2013-08-18 – 2013-08-19 (×2): 100 mg via ORAL
  Filled 2013-08-18 (×2): qty 1

## 2013-08-18 NOTE — ED Notes (Addendum)
Pt presents to ed with c/o SI. Pt sts "I want to cut myself because of the way my sister treats me". Pt denies any HI

## 2013-08-18 NOTE — ED Notes (Signed)
Patient denies SI, HI, VH. States that he hears howling. Contracts for safety. Denies feelings of anxiety and depression.  Encouragement offered. Given medication as ordered. Patient safety maintained, Q 15 checks in place.

## 2013-08-18 NOTE — ED Provider Notes (Signed)
CSN: [id number]     Arrival date & time 08/18/13  1835 History  This chart was scribed for non-physician practitioner, Ivonne Andrew, PA-C,working with Gerhard Munch, MD, by Karle Plumber, ED Scribe.  This patient was seen in room WTR3/WLPT3 and the patient's care was started at 8:52 PM.  Chief Complaint  Patient presents with  . Medical Clearance   The history is provided by the patient. No language interpreter was used.   HPI Comments:  Ryan Bartlett is a 17 y.o. male who presents to the Emergency Department complaining of depression. He states he feels as if he cannot do anything right as far as his younger sister is concerned. He states she calls him names. He states he does not work and attends [employer] He states he and his two sisters live with their mother. He states he currently sees a psychiatrist but has not contacted them. He states his next appt is January 9. He states he sees him every month for the past year. He states the treatment does not seem to be helping. He states it is hard for him to explain why he is feeling sad. He reports h/o cutting himself. He reports suicidal ideations. His plan is to electrocute himself by putting a radio into a bath tub. He reports an intermittent cough. He denies SOB, rhinorrhea, neck pain, CP, fever, nausea, vomiting, diarrhea or congestion. He reports h/o ADHD, Bipolar disorder, and depression.    Past Medical History  Diagnosis Date  . ADHD (attention deficit hyperactivity disorder)   . Unspecified episodic mood disorder   . Oppositional defiant disorder   . Bipolar disorder   . Depression    Past Surgical History  Procedure Laterality Date  . No past surgeries     Family History  Problem Relation Age of Onset  . Depression Mother   . Diabetes Other   . Hyperlipidemia Other   . Hypertension Other    History  Substance Use Topics  . Smoking status: Current Every Day Smoker -- 0.50 packs/day for 5 years    Types: Cigarettes  .  Smokeless tobacco: Never Used  . Alcohol Use: 3.6 oz/week    6 Cans of beer per week     Comment: occassionally    Review of Systems  Constitutional: Negative for fever.  Respiratory: Positive for cough. Negative for shortness of breath.   Cardiovascular: Negative for chest pain.  Gastrointestinal: Negative for nausea, vomiting and diarrhea.  Musculoskeletal: Negative for neck pain.  Psychiatric/Behavioral: Positive for suicidal ideas.  All other systems reviewed and are negative.    Allergies  Peanut-containing drug products and Lactose intolerance (gi)  Home Medications   Current Outpatient Rx  Name  Route  Sig  Dispense  Refill  . benztropine (COGENTIN) 1 MG tablet   Oral   Take 1 tablet (1 mg total) by mouth 2 (two) times daily.   30 tablet   0   . haloperidol (HALDOL) 10 MG tablet   Oral   Take 1 tablet (10 mg total) by mouth 2 (two) times daily.   60 tablet   0   . Haloperidol Lactate (HALDOL IJ)   Injection   Inject as directed.         . hydrOXYzine (ATARAX/VISTARIL) 50 MG tablet   Oral   Take 1 tablet (50 mg total) by mouth every 6 (six) hours as needed for anxiety (agitation).   30 tablet   0   . lamoTRIgine (LAMICTAL) 100 MG  tablet   Oral   Take 1 tablet (100 mg total) by mouth daily. For mood stabilization.   30 tablet   0   . traZODone (DESYREL) 100 MG tablet   Oral   Take 1 tablet (100 mg total) by mouth at bedtime. For insomnia.   30 tablet   0    Triage Vitals: BP 133/80  Pulse 84  Temp(Src) 97.9 F (36.6 C) (Oral)  Resp 16  SpO2 100% Physical Exam  Nursing note and vitals reviewed. Constitutional: He is oriented to person, place, and time. He appears well-developed and well-nourished.  HENT:  Head: Normocephalic and atraumatic.  Right Ear: External ear normal.  Left Ear: External ear normal.  Nose: Nose normal.  Mouth/Throat: Oropharynx is clear and moist.  Eyes: EOM are normal.  Neck: Normal range of motion.   Cardiovascular: Normal rate, regular rhythm and normal heart sounds.  Exam reveals no gallop and no friction rub.   No murmur heard. Pulmonary/Chest: Effort normal and breath sounds normal. No respiratory distress. He has no wheezes. He has no rales. He exhibits no tenderness.  Musculoskeletal: Normal range of motion.  Neurological: He is alert and oriented to person, place, and time.  Skin: Skin is warm and dry.  Psychiatric:  Suicidal and depressed.    ED Course  Procedures  DIAGNOSTIC STUDIES: Oxygen Saturation is 100% on RA, normal by my interpretation.   COORDINATION OF CARE: 8:58 PM- Lab tests review and pt is clear for further psychiatric evaluation. Psychiatric holding orders in place. TTS consult placed. Pt verbalizes understanding and agrees to plan.    Medications  benztropine (COGENTIN) tablet 1 mg (not administered)  haloperidol (HALDOL) tablet 10 mg (not administered)  hydrOXYzine (ATARAX/VISTARIL) tablet 50 mg (not administered)  lamoTRIgine (LAMICTAL) tablet 100 mg (not administered)  traZODone (DESYREL) tablet 100 mg (not administered)   Results for orders placed during the hospital encounter of 08/18/13  ACETAMINOPHEN LEVEL      Result Value Range   Acetaminophen (Tylenol), Serum <15.0  10 - 30 ug/mL  CBC      Result Value Range   WBC 8.1  4.0 - 10.5 K/uL   RBC 5.26  4.22 - 5.81 MIL/uL   Hemoglobin 15.0  13.0 - 17.0 g/dL   HCT 16.1  09.6 - 04.5 %   MCV 81.2  78.0 - 100.0 fL   MCH 28.5  26.0 - 34.0 pg   MCHC 35.1  30.0 - 36.0 g/dL   RDW 40.9  81.1 - 91.4 %   Platelets 224  150 - 400 K/uL  COMPREHENSIVE METABOLIC PANEL      Result Value Range   Sodium 136 (*) 137 - 147 mEq/L   Potassium 3.8  3.7 - 5.3 mEq/L   Chloride 98  96 - 112 mEq/L   CO2 25  19 - 32 mEq/L   Glucose, Bld 100 (*) 70 - 99 mg/dL   BUN 14  6 - 23 mg/dL   Creatinine, Ser 7.82  0.50 - 1.35 mg/dL   Calcium 9.9  8.4 - 95.6 mg/dL   Total Protein 7.9  6.0 - 8.3 g/dL   Albumin 4.3  3.5  - 5.2 g/dL   AST 15  0 - 37 U/L   ALT 18  0 - 53 U/L   Alkaline Phosphatase 67  39 - 117 U/L   Total Bilirubin 0.3  0.3 - 1.2 mg/dL   GFR calc non Af Amer >90  >90 mL/min  GFR calc Af Amer >90  >90 mL/min  ETHANOL      Result Value Range   Alcohol, Ethyl (B) <11  0 - 11 mg/dL  SALICYLATE LEVEL      Result Value Range   Salicylate Lvl <2.0 (*) 2.8 - 20.0 mg/dL  URINE RAPID DRUG SCREEN (HOSP PERFORMED)      Result Value Range   Opiates NONE DETECTED  NONE DETECTED   Cocaine NONE DETECTED  NONE DETECTED   Benzodiazepines NONE DETECTED  NONE DETECTED   Amphetamines NONE DETECTED  NONE DETECTED   Tetrahydrocannabinol NONE DETECTED  NONE DETECTED   Barbiturates NONE DETECTED  NONE DETECTED      MDM   1. Suicidal ideation      I personally performed the services described in this documentation, which was scribed in my presence. The recorded information has been reviewed and is accurate.    Angus Seller, PA-C 08/19/13 [phone]

## 2013-08-18 NOTE — BH Assessment (Signed)
MHT Ryan Bartlett accompanied patient to [facility] with [facility] transport service.   Glorious Peach, MS, LCASA Assessment Counselor

## 2013-08-18 NOTE — BH Assessment (Signed)
Spoke with Charge Nurse Milus Glazier at [facility] to inform her of pt transfer. Informed nurse that patient has been assessed by TTS today and no TTS order or consult is required. Recommendation at this time Per [facility] Thurman Coyer is for patient to be medically cleared and  evaluated face to face by Psychiatrist in the morning to further determine disposition   Glorious Peach, MS, LCASA Assessment Counselor
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
## 2013-08-18 NOTE — BH Assessment (Signed)
Spoke with Britta Mccreedy at [phone] at [facility] to initiate transport to [facility]. Britta Mccreedy reports an ETA of 10-15 minutes.  Glorious Peach, MS, LCASA Assessment Counselor

## 2013-08-18 NOTE — BH Assessment (Signed)
Pt assessed by TTS.Consulted with Thurman Coyer [facility]. Per [facility] pt to be referred to [facility] to be  evaluated by Psychiatrist in the morning. Their is currently no 400 Hall bed available for patient at this time.   Glorious Peach, MS, LCASA Assessment Counselor

## 2013-08-19 ENCOUNTER — Encounter (HOSPITAL_COMMUNITY): Payer: Self-pay | Admitting: Registered Nurse

## 2013-08-19 ENCOUNTER — Encounter (HOSPITAL_COMMUNITY): Payer: Self-pay | Admitting: Emergency Medicine

## 2013-08-19 ENCOUNTER — Inpatient Hospital Stay (HOSPITAL_COMMUNITY)
Admission: AD | Admit: 2013-08-19 | Discharge: 2013-08-25 | DRG: 885 | Disposition: A | Discharge status: Home / Self Care | Payer: Medicaid Other | Source: Intra-hospital | Attending: Psychiatry | Admitting: Psychiatry

## 2013-08-19 DIAGNOSIS — F912 Conduct disorder, adolescent-onset type: Secondary | ICD-10-CM

## 2013-08-19 DIAGNOSIS — R45851 Suicidal ideations: Secondary | ICD-10-CM

## 2013-08-19 DIAGNOSIS — F314 Bipolar disorder, current episode depressed, severe, without psychotic features: Secondary | ICD-10-CM

## 2013-08-19 DIAGNOSIS — F319 Bipolar disorder, unspecified: Secondary | ICD-10-CM

## 2013-08-19 DIAGNOSIS — F1210 Cannabis abuse, uncomplicated: Secondary | ICD-10-CM

## 2013-08-19 DIAGNOSIS — F3130 Bipolar disorder, current episode depressed, mild or moderate severity, unspecified: Principal | ICD-10-CM | POA: Diagnosis present

## 2013-08-19 DIAGNOSIS — F909 Attention-deficit hyperactivity disorder, unspecified type: Secondary | ICD-10-CM | POA: Diagnosis present

## 2013-08-19 DIAGNOSIS — Z79899 Other long term (current) drug therapy: Secondary | ICD-10-CM

## 2013-08-19 DIAGNOSIS — F902 Attention-deficit hyperactivity disorder, combined type: Secondary | ICD-10-CM

## 2013-08-19 MED ORDER — MAGNESIUM HYDROXIDE 400 MG/5ML PO SUSP: 30.0000 mL | Freq: Every day | ORAL | Status: DC | PRN

## 2013-08-19 MED ORDER — ACETAMINOPHEN 325 MG PO TABS
650.0000 mg | ORAL_TABLET | Freq: Four times a day (QID) | ORAL | Status: DC | PRN
  Administered 2013-08-23 – 2013-08-25 (×2): 650 mg via ORAL
  Filled 2013-08-19 (×2): qty 2

## 2013-08-19 MED ORDER — TRAZODONE HCL 100 MG PO TABS
100.0000 mg | ORAL_TABLET | Freq: Every day | ORAL | Status: DC
  Administered 2013-08-19 – 2013-08-21 (×3): 100 mg via ORAL
  Filled 2013-08-19 (×4): qty 1
  Filled 2013-08-19: qty 3
  Filled 2013-08-19 (×4): qty 1

## 2013-08-19 MED ORDER — HALOPERIDOL 5 MG PO TABS
10.0000 mg | ORAL_TABLET | Freq: Two times a day (BID) | ORAL | Status: DC
  Administered 2013-08-19 – 2013-08-25 (×12): 10 mg via ORAL
  Filled 2013-08-19 (×12): qty 2
  Filled 2013-08-19: qty 12
  Filled 2013-08-19 (×2): qty 2
  Filled 2013-08-19: qty 12
  Filled 2013-08-19 (×2): qty 2

## 2013-08-19 MED ORDER — LAMOTRIGINE 100 MG PO TABS
100.0000 mg | ORAL_TABLET | Freq: Every day | ORAL | Status: DC
  Administered 2013-08-20 – 2013-08-25 (×6): 100 mg via ORAL
  Filled 2013-08-19 (×5): qty 1
  Filled 2013-08-19: qty 3
  Filled 2013-08-19 (×3): qty 1

## 2013-08-19 MED ORDER — ALUM & MAG HYDROXIDE-SIMETH 200-200-20 MG/5ML PO SUSP: 30.0000 mL | ORAL | Status: DC | PRN

## 2013-08-19 MED ORDER — HYDROXYZINE HCL 50 MG PO TABS
50.0000 mg | ORAL_TABLET | Freq: Four times a day (QID) | ORAL | Status: DC | PRN
Start: 2013-08-19 — End: 2013-08-25
  Administered 2013-08-21 – 2013-08-24 (×4): 50 mg via ORAL
  Filled 2013-08-19 (×5): qty 1
  Filled 2013-08-19: qty 10

## 2013-08-19 MED ORDER — BENZTROPINE MESYLATE 1 MG PO TABS
1.0000 mg | ORAL_TABLET | Freq: Two times a day (BID) | ORAL | Status: DC
  Administered 2013-08-19 – 2013-08-25 (×12): 1 mg via ORAL
  Filled 2013-08-19 (×12): qty 1
  Filled 2013-08-19: qty 6
  Filled 2013-08-19 (×3): qty 1
  Filled 2013-08-19: qty 6
  Filled 2013-08-19: qty 1

## 2013-08-19 NOTE — Progress Notes (Signed)
Patient has been accepted to [facility] Bed 507-1.  Dr. Elsie Saas is the accepting doctor.  The support paperwork has been faxed to [facility].  Writer informed the ER MD and the nurse working with the patient. The nurse will arrange transportation for the patient through [facility].    Writer informed the nurse that the [facility] Minerva Areola [phone]) must be notified before the patient is transported to [facility].

## 2013-08-19 NOTE — Progress Notes (Signed)
Pt referred to [place], [facility], [facility].   No beds available at [facility], [facility], [place], [place], [facility]  .Frutoso Schatz [phone]  ED CSW 08/19/2013 1214pm

## 2013-08-19 NOTE — Consult Note (Signed)
Note reviewed and agreed with  

## 2013-08-19 NOTE — Consult Note (Signed)
  Subjective:  Patietn states "I went for assessment yesterxday and I told them I wanted to cut myself."  Patient states that the cutting he wanted to do was to relaease stress and not kill himself; "but I am having suicidal thoughts".  Patient states that his biggest trigger is that he will start back to school at the beginning of the year and he doesn't feel good about it because he has a learning disability and is unsure how he will do in school.  Patient states that he as gets stress from home with his 2 sisters.  Patient states that he has a good family and his mother is very supportive.  Patient states that he doesn't feel safe going back home and would feel better with inpatient treatment and learning more coping skills to prevent self injury (cutting),  Axis I: Conduct Disorder and Bipolar disorder Axis II: Deferred Axis III:  Past Medical History  Diagnosis Date  . ADHD (attention deficit hyperactivity disorder)   . Unspecified episodic mood disorder   . Oppositional defiant disorder   . Bipolar disorder   . Depression    Axis IV: educational problems, other psychosocial or environmental problems and problems related to social environment Axis V: 11-20 some danger of hurting self or others possible OR occasionally fails to maintain minimal personal hygiene OR gross impairment in communication  Psychiatric Specialty Exam: Physical Exam  ROS  Blood pressure 96/58, pulse 62, temperature 97.3 F (36.3 C), temperature source Oral, resp. rate 18, SpO2 97.00%.There is no weight on file to calculate BMI.  General Appearance: Casual  Eye Contact::  Good  Speech:  Clear and Coherent and Normal Rate  Volume:  Normal  Mood:  Anxious and Depressed  Affect:  Congruent and Depressed  Thought Process:  Circumstantial, Coherent and Goal Directed  Orientation:  Full (Time, Place, and Person)  Thought Content:  Rumination  Suicidal Thoughts:  Yes.  without intent/plan  Homicidal Thoughts:  No   Memory:  Immediate;   Good Recent;   Good  Judgement:  Impaired  Insight:  Lacking  Psychomotor Activity:  Mannerisms  Concentration:  Fair  Recall:  Good  Akathisia:  No  Handed:  Right  AIMS (if indicated):     Assets:  Communication Skills Desire for Improvement Housing  Sleep:      Face to face consult/interview with Dr. Ladona Ridgel  Disposition:  Inpatient treatment recommended.  Start home medications.  Monitory for safety and stabilization until inpatient bed is located.    Adalia Pettis B. Sequoia Mincey FNP-BC
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
## 2013-08-19 NOTE — Tx Team (Signed)
Initial Interdisciplinary Treatment Plan  PATIENT STRENGTHS: (choose at least two) Ability for insight Active sense of humor Average or above average intelligence Communication skills Physical Health Supportive family/friends  PATIENT STRESSORS: hallucinations   PROBLEM LIST: Problem List/Patient Goals Date to be addressed Date deferred Reason deferred Estimated date of resolution  Hallucinations 08/19/2013     Suicide Risk 08/19/2013                                                DISCHARGE CRITERIA:  Improved stabilization in mood, thinking, and/or behavior Medical problems require only outpatient monitoring Need for constant or close observation no longer present Reduction of life-threatening or endangering symptoms to within safe limits Verbal commitment to aftercare and medication compliance  PRELIMINARY DISCHARGE PLAN: Outpatient therapy Return to previous living arrangement  PATIENT/FAMIILY INVOLVEMENT: This treatment plan has been presented to and reviewed with the patient, Ryan Bartlett, and/or family member.  The patient and family have been given the opportunity to ask questions and make suggestions.  Renaee Munda 08/19/2013, 8:35 PM

## 2013-08-19 NOTE — [facility] Group Notes (Signed)
Adult Psychoeducational Group Note  Date:  08/19/2013 Time:  9:52 PM  Group Topic/Focus:  Wrap-Up Group:   The focus of this group is to help patients review their daily goal of treatment and discuss progress on daily workbooks.  Participation Level:  None  Participation Quality:  Appropriate  Affect:  Appropriate  Cognitive:  Appropriate  Insight: None  Engagement in Group:  None  Modes of Intervention:  Discussion  Additional Comments:  Patent did not participate in group.   Delia Chimes 08/19/2013, 9:52 PM

## 2013-08-20 DIAGNOSIS — F319 Bipolar disorder, unspecified: Secondary | ICD-10-CM

## 2013-08-20 DIAGNOSIS — F909 Attention-deficit hyperactivity disorder, unspecified type: Secondary | ICD-10-CM

## 2013-08-20 DIAGNOSIS — R45851 Suicidal ideations: Secondary | ICD-10-CM

## 2013-08-20 DIAGNOSIS — F29 Unspecified psychosis not due to a substance or known physiological condition: Secondary | ICD-10-CM

## 2013-08-20 DIAGNOSIS — F912 Conduct disorder, adolescent-onset type: Secondary | ICD-10-CM

## 2013-08-20 DIAGNOSIS — F1210 Cannabis abuse, uncomplicated: Secondary | ICD-10-CM

## 2013-08-20 NOTE — H&P (Signed)
Psychiatric Admission Assessment Adult  Patient Identification:  Ryan Bartlett Date of Evaluation:  08/20/2013 Chief Complaint:  SCHIZOAFFECTIVE DISORDER History of Present Illness: Ryan Bartlett admitted voluntarily on emergently from [facility] long emergency department after medical clearance for bipolar disorder most recent episode depression, auditory hallucinations, suicide ideations and self-injurious behaviors. Patient went for assessment yesterxday and I told them he wanted to cut myself." Patient states that the cutting he wanted to do was to relaease stress and unable to contract for safety patient also reported he has suicidal thoughts". Patient biggest trigger is that he will start back to school at the beginning of the year and he doesn't feel good about it because he has a learning disability and is unsure how he will do in school. Patient states that he as gets stress from home with his 2 sisters. Patient states that he has a good family and his mother is very supportive. Patient states that he doesn't feel safe going back home and would feel better with inpatient treatment and learning more coping skills to prevent self injury (cutting), and reportedly has a breaking and entering charges. Patient to urine drug screen was negative for drugs of Abuse in the has a history of smoking marijuana.  Elements:  Location:  Psychiatric inpatient unit. Quality:  Depression. Severity:  Suicidal ideation. Timing:  Argument with the family members. Duration:  Few weeks. Context:  Unable to contract for safety. Associated Signs/Synptoms: Depression Symptoms:  depressed mood, anhedonia, insomnia, psychomotor retardation, feelings of worthlessness/guilt, difficulty concentrating, hopelessness, recurrent thoughts of death, loss of energy/fatigue, weight gain, decreased labido, decreased appetite, (Hypo) Manic Symptoms:  Distractibility, Impulsivity, Irritable Mood, Labiality of Mood, Anxiety  Symptoms:  Excessive Worry, Psychotic Symptoms:  Denied PTSD Symptoms: NA  Psychiatric Specialty Exam: Physical Exam  ROS  Blood pressure 94/64, pulse 72, temperature 98.6 F (37 C), temperature source Oral, resp. rate 17, height _0  (1.702 m), weight 92.987 kg (205 lb).Body mass index is 32.1 kg/(m^2).  General Appearance: Disheveled and Guarded  Engineer, water::  Fair  Speech:  Clear and Coherent and Slow  Volume:  Decreased  Mood:  Depressed and Dysphoric  Affect:  Constricted and Depressed  Thought Process:  Goal Directed and Intact  Orientation:  Full (Time, Place, and Person)  Thought Content:  Obsessions and Rumination  Suicidal Thoughts:  Yes.  with intent/plan  Homicidal Thoughts:  No  Memory:  Immediate;   Fair  Judgement:  Impaired  Insight:  Lacking  Psychomotor Activity:  Psychomotor Retardation  Concentration:  Fair  Recall:  Fair  Akathisia:  NA  Handed:  Right  AIMS (if indicated):     Assets:  Communication Skills Desire for Improvement Financial Resources/Insurance Housing Physical Health Resilience Social Support  Sleep:  Number of Hours: 6.75    Past Psychiatric History: Diagnosis: Bipolar disorder   Hospitalizations:  Outpatient Care: He has   Substance abuse: Yes  Self-Mutilation: yes  Suicidal Attempts: self-injurious behaviors  Violent Behaviors:   Past Medical History:   Past Medical History  Diagnosis Date  . ADHD (attention deficit hyperactivity disorder)   . Unspecified episodic mood disorder   . Oppositional defiant disorder   . Bipolar disorder   . Depression    None. Allergies:   Allergies  Allergen Reactions  . Peanut-Containing Drug Products Anaphylaxis  . Lactose Intolerance (Gi) Diarrhea and Nausea And Vomiting   PTA Medications: Prescriptions prior to admission  Medication Sig Dispense Refill  . benztropine (COGENTIN) 1 MG tablet Take  1 tablet (1 mg total) by mouth 2 (two) times daily.  30 tablet  0  . haloperidol  (HALDOL) 10 MG tablet Take 1 tablet (10 mg total) by mouth 2 (two) times daily.  60 tablet  0  . Haloperidol Lactate (HALDOL IJ) Inject as directed.      . hydrOXYzine (ATARAX/VISTARIL) 50 MG tablet Take 1 tablet (50 mg total) by mouth every 6 (six) hours as needed for anxiety (agitation).  30 tablet  0  . lamoTRIgine (LAMICTAL) 100 MG tablet Take 1 tablet (100 mg total) by mouth daily. For mood stabilization.  30 tablet  0  . traZODone (DESYREL) 100 MG tablet Take 1 tablet (100 mg total) by mouth at bedtime. For insomnia.  30 tablet  0    Previous Psychotropic Medications:  Medication/Dose  Haldol   Cogentin   Lamictal            Substance Abuse History in the last 12 months:  yes  Consequences of Substance Abuse: NA  Social History:  reports that he has been smoking Cigarettes.  He has a 2.5 pack-year smoking history. He has never used smokeless tobacco. He reports that he drinks about 3.6 ounces of alcohol per week. He reports that he does not use illicit drugs. Additional Social History: History of alcohol / drug use?: No history of alcohol / drug abuse                    Current Place of Residence:   Place of Birth:   Family Members: Marital Status:  Single Children:  Sons:  Daughters: Relationships: Education:  GED Educational Problems/Performance: Religious Beliefs/Practices: History of Abuse (Emotional/Phsycial/Sexual) Occupational Experiences; Military History:  None. Legal History: Hobbies/Interests:  Family History:   Family History  Problem Relation Age of Onset  . Depression Mother   . Diabetes Other   . Hyperlipidemia Other   . Hypertension Other     Results for orders placed during the hospital encounter of 08/18/13 (from the past 72 hour(s))  ACETAMINOPHEN LEVEL     Status: None   Collection Time    08/18/13  7:25 PM      Result Value Range   Acetaminophen (Tylenol), Serum <15.0  10 - 30 ug/mL   Comment:            THERAPEUTIC  CONCENTRATIONS VARY     SIGNIFICANTLY. A RANGE OF 10-30     ug/mL MAY BE AN EFFECTIVE     CONCENTRATION FOR MANY PATIENTS.     HOWEVER, SOME ARE BEST TREATED     AT CONCENTRATIONS OUTSIDE THIS     RANGE.     ACETAMINOPHEN CONCENTRATIONS     >150 ug/mL AT 4 HOURS AFTER     INGESTION AND >50 ug/mL AT 12     HOURS AFTER INGESTION ARE     OFTEN ASSOCIATED WITH TOXIC     REACTIONS.  CBC     Status: None   Collection Time    08/18/13  7:25 PM      Result Value Range   WBC 8.1  4.0 - 10.5 K/uL   RBC 5.26  4.22 - 5.81 MIL/uL   Hemoglobin 15.0  13.0 - 17.0 g/dL   HCT 42.7  39.0 - 52.0 %   MCV 81.2  78.0 - 100.0 fL   MCH 28.5  26.0 - 34.0 pg   MCHC 35.1  30.0 - 36.0 g/dL   RDW 12.3  11.5 - 15.5 %  Platelets 224  150 - 400 K/uL  COMPREHENSIVE METABOLIC PANEL     Status: Abnormal   Collection Time    08/18/13  7:25 PM      Result Value Range   Sodium 136 (*) 137 - 147 mEq/L   Comment: Please note change in reference range.   Potassium 3.8  3.7 - 5.3 mEq/L   Comment: Please note change in reference range.   Chloride 98  96 - 112 mEq/L   CO2 25  19 - 32 mEq/L   Glucose, Bld 100 (*) 70 - 99 mg/dL   BUN 14  6 - 23 mg/dL   Creatinine, Ser 1.07  0.50 - 1.35 mg/dL   Calcium 9.9  8.4 - 10.5 mg/dL   Total Protein 7.9  6.0 - 8.3 g/dL   Albumin 4.3  3.5 - 5.2 g/dL   AST 15  0 - 37 U/L   ALT 18  0 - 53 U/L   Alkaline Phosphatase 67  39 - 117 U/L   Total Bilirubin 0.3  0.3 - 1.2 mg/dL   GFR calc non Af Amer >90  >90 mL/min   GFR calc Af Amer >90  >90 mL/min   Comment: (NOTE)     The eGFR has been calculated using the CKD EPI equation.     This calculation has not been validated in all clinical situations.     eGFR's persistently <90 mL/min signify possible Chronic Kidney     Disease.  ETHANOL     Status: None   Collection Time    08/18/13  7:25 PM      Result Value Range   Alcohol, Ethyl (B) <11  0 - 11 mg/dL   Comment:            LOWEST DETECTABLE LIMIT FOR     SERUM ALCOHOL IS  11 mg/dL     FOR MEDICAL PURPOSES ONLY  SALICYLATE LEVEL     Status: Abnormal   Collection Time    08/18/13  7:25 PM      Result Value Range   Salicylate Lvl <1.6 (*) 2.8 - 20.0 mg/dL  URINE RAPID DRUG SCREEN (HOSP PERFORMED)     Status: None   Collection Time    08/18/13  7:26 PM      Result Value Range   Opiates NONE DETECTED  NONE DETECTED   Cocaine NONE DETECTED  NONE DETECTED   Benzodiazepines NONE DETECTED  NONE DETECTED   Amphetamines NONE DETECTED  NONE DETECTED   Tetrahydrocannabinol NONE DETECTED  NONE DETECTED   Barbiturates NONE DETECTED  NONE DETECTED   Comment:            DRUG SCREEN FOR MEDICAL PURPOSES     ONLY.  IF CONFIRMATION IS NEEDED     FOR ANY PURPOSE, NOTIFY LAB     WITHIN 5 DAYS.                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 AXIS II:  Cluster B Traits AXIS III:   Past Medical History  Diagnosis Date  . ADHD (attention deficit hyperactivity disorder)   . Unspecified episodic mood disorder   . Oppositional defiant disorder   . Bipolar disorder   . Depression    AXIS IV:  economic problems, occupational problems, other psychosocial or environmental problems, problems related to legal system/crime, problems related to  social environment and problems with primary support group AXIS V:  41-50 serious symptoms  Treatment Plan/Recommendations:  Admit for crisis stabilization, safety monitoring on medication management.  Treatment Plan Summary: Daily contact with patient to assess and evaluate symptoms and progress in treatment Medication management Current Medications:  Current Facility-Administered Medications  Medication Dose Route Frequency Provider Last Rate Last Dose  . acetaminophen (TYLENOL) tablet 650 mg  650 mg Oral Q6H PRN Laverle Hobby, PA-C      . alum & mag hydroxide-simeth (MAALOX/MYLANTA) 200-200-20 MG/5ML suspension 30 mL  30 mL Oral Q4H PRN Laverle Hobby, PA-C      . benztropine (COGENTIN) tablet 1 mg  1 mg Oral BID Laverle Hobby, PA-C   1 mg at 08/20/13 6002  . haloperidol (HALDOL) tablet 10 mg  10 mg Oral BID Laverle Hobby, PA-C   10 mg at 08/20/13 9847  . hydrOXYzine (ATARAX/VISTARIL) tablet 50 mg  50 mg Oral Q6H PRN Laverle Hobby, PA-C      . lamoTRIgine (LAMICTAL) tablet 100 mg  100 mg Oral Daily Laverle Hobby, PA-C   100 mg at 08/20/13 0818  . magnesium hydroxide (MILK OF MAGNESIA) suspension 30 mL  30 mL Oral Daily PRN Laverle Hobby, PA-C      . traZODone (DESYREL) tablet 100 mg  100 mg Oral QHS Laverle Hobby, PA-C   100 mg at 08/19/13 2103    Observation Level/Precautions:  15 minute checks  Laboratory:  Reviewed admission labs  Psychotherapy:  Individual therapy, group therapy, milieu therapy and substance abuse counseling   Medications:  Haldol 10 mg twice daily Cogentin 1 mg twice daily Lamictal 100 mg daily and trazodone 10 mg at bedtime   Consultations:  None   Discharge Concerns:  Safety   Estimated LOS: 4-5 days   Other:     I certify that inpatient services furnished can reasonably be expected to improve the patient's condition.   Eann Cleland,JANARDHAHA R. 1/1/20151:33 PM

## 2013-08-20 NOTE — [facility] Suicide Risk Assessment (Signed)
Suicide Risk Assessment  Admission Assessment     Nursing information obtained from:  Patient Demographic factors:  Male;Adolescent or young adult;Unemployed Current Mental Status:  NA Loss Factors:  NA Historical Factors:  Prior suicide attempts Risk Reduction Factors:  Living with another person, especially a relative;Positive therapeutic relationship  CLINICAL FACTORS:   Severe Anxiety and/or Agitation Bipolar Disorder:   Depressive phase Depression:   Anhedonia Hopelessness Impulsivity Insomnia Recent sense of peace/wellbeing Severe Alcohol/Substance Abuse/Dependencies Personality Disorders:   Cluster B Comorbid depression Unstable or Poor Therapeutic Relationship Previous Psychiatric Diagnoses and Treatments  COGNITIVE FEATURES THAT CONTRIBUTE TO RISK:  Closed-mindedness Loss of executive function Polarized thinking Thought constriction (tunnel vision)    SUICIDE RISK:   Moderate:  Frequent suicidal ideation with limited intensity, and duration, some specificity in terms of plans, no associated intent, good self-control, limited dysphoria/symptomatology, some risk factors present, and identifiable protective factors, including available and accessible social support.  PLAN OF CARE: Admit for crisis stabilization, safety monitoring and medication management for bipolar disorder most recent episode his depression and conduct disorder, adolescent onset type.  I certify that inpatient services furnished can reasonably be expected to improve the patient's condition.  Kindrick Lankford,JANARDHAHA R. 08/20/2013, 1:31 PM

## 2013-08-20 NOTE — Progress Notes (Signed)
Pt did not attend Karaoke group.

## 2013-08-20 NOTE — Progress Notes (Signed)
D: Patient has been in bed tonight.  Patient only got out of bed for bedtime medications.  Patient did not attend group.  Patient states he just wants to sleep.  Patient states he thinks his medications make him sleepy.  Patient states he ha not learned anything since [facility] has [facility].  Patient denies SI/HI and denies AVH.  A: Staff to monitor Q 15 mins for safety.  Encouragement and support offered.  Scheduled medications administered per orders.  Writer encouraged patient to get a good nights sleep but he has to get up and participate tomorrow  R: Patient remains safe on the unit.  Patient attended group tonight.  Patient agreed that he would get out of bed tomorrow.  Patient taking administered medications.  Patient did not attend karaoke group tonight.

## 2013-08-20 NOTE — Progress Notes (Signed)
Adult Psychoeducational Group Note  Date:  08/20/2013 Time:  9:15 AM  Group Topic/Focus:  Morning Wellness Group  Participation Level:  Did Not Attend  Additional Comments:  Patient was lying in bed at this time.  Harold BarbanByrd, Mase Dhondt E 08/20/2013, 10:13 AM

## 2013-08-20 NOTE — Progress Notes (Signed)
D: Patient appropriate and cooperative with staff; presents with flat, depressed affect and mood. Declined self inventory sheet today. Writer observed patient lying in bed all day; not attending any groups. Patient compliant with medication regimen.  A: Support and encouragement provided to patient. Administered scheduled medications per ordering MD. Monitor Q15 minute checks for safety.  R: Patient receptive. Denies SI/HI/AVH. Patient remains safe on the unit.

## 2013-08-21 NOTE — Progress Notes (Signed)
D: Patient's affect appropriate and mood is depressed. He reported on the self inventory sheet that he's sleeping fair, appetite is good, normal energy level and ability to pay attention is improving. Patient rated depression "5" and feelings of hopelessness "2". He's interacting more today with peers in the dayroom, unlike yesterday when the patient was observed lying in bed the majority of the day. Continues to comply with medications.  A: Support and encouragement provided to patient. Scheduled medications administered per ordering MD. Maintain Q15 minute checks for safety.  R: Patient receptive. Denies SI/HI. Patient remains safe.

## 2013-08-21 NOTE — Progress Notes (Signed)
Writer spoke with patient 1:1 and he is concerned about being discharged because he reports that he has court dates coming up is starting school next week. Patient c/o not being able to remain asleep once he falls asleep and writer inquired if he spoke with the doctor/NP about this today and he reports that he was not seen by anyone today. Patient attended wrap up group and participated, compliant with medications scheduled. Patient reports that his mother and grandfather are his support system. Patient currently denies si/hi/a/v hallucinations. Safety maintained with 15 min checks.

## 2013-08-21 NOTE — [facility] Counselor (Signed)
Adult Psychosocial Assessment Update Interdisciplinary Team  Previous [facility] admissions/discharges:  Admissions Discharges  Date: 07/01/13 Date:  07/03/13  Date: Date:  Date: Date:  Date: Date:  Date: Date:   Changes since the last Psychosocial Assessment (including adherence to outpatient mental health and/or substance abuse treatment, situational issues contributing to decompensation and/or relapse). Patient reports becoming increasingly depressed due to problems at home.  He advised of cutting and deciding to come into the hospital.             Discharge Plan 1. Will you be returning to the same living situation after discharge?   Yes: No:      If no, what is your plan?    Yes, patient advised of plans to return to his mother's home at discharge.       2. Would you like a referral for services when you are discharged? Yes:     If yes, for what services?  No:       No, patient is [facility] [place] outpatient service and plans to follow up with them at discharge.       Summary and Recommendations (to be completed by the evaluator) Ryan Bartlett is a 18 year old African American male admitted with Schizoaffective Disorder.  He will benefit from crisis stabilization, evaluation for medication, psycho-education groups for coping skills development, group therapy and case management for discharge planning.                        Signature:  Wynn BankerHodnett, [name], 08/21/2013 1:11 PM

## 2013-08-21 NOTE — Progress Notes (Signed)
[facility] MD Progress Note  08/21/2013 12:37 PM Ryan Bartlett  MRN:  [id number]  Subjective:  Patient was seen and chart reviewed. Patient was found sleeping in his room and reportedly medication-induced sedation. Patient has been compliant with his medication. Patient has disturbance of sleep and appetite. Patient is able to contract for safety while in the hospital. Diagnosis:   DSM5: Schizophrenia Disorders:   Obsessive-Compulsive Disorders:   Trauma-Stressor Disorders:   Substance/Addictive Disorders:   Depressive Disorders:  Disruptive Mood Dysregulation Disorder (296.99)  Axis I: ADHD, combined type, Bipolar, Depressed, Substance Induced Mood Disorder and Cannabis abuse  ADL's:  Impaired  Sleep: Poor  Appetite:  Poor  Suicidal Ideation:  Patient has suicidal ideation under contract for the safety only in the hospital  Homicidal Ideation:  Denied AEB (as evidenced by):  Psychiatric Specialty Exam: ROS  Blood pressure 141/85, pulse 102, temperature 98 F (36.7 C), temperature source Oral, resp. rate 16, height 5\' 7"  (1.702 m), weight 92.987 kg (205 lb).Body mass index is 32.1 kg/(m^2).  General Appearance: Disheveled  Eye Contact::  Minimal  Speech:  Blocked and Slurred  Volume:  Decreased  Mood:  Anxious and Depressed  Affect:  Constricted and Depressed  Thought Process:  Disorganized  Orientation:  Full (Time, Place, and Person)  Thought Content:  Obsessions and Rumination  Suicidal Thoughts:  Yes.  with intent/plan  Homicidal Thoughts:  No  Memory:  Immediate;   Fair  Judgement:  Impaired  Insight:  Lacking  Psychomotor Activity:  Psychomotor Retardation  Concentration:  Fair  Recall:  Fair  Akathisia:  NA  Handed:  Right  AIMS (if indicated):     Assets:  Communication Skills Desire for Improvement Physical Health Resilience Social Support  Sleep:  Number of Hours: 4.75   Current Medications: Current Facility-Administered Medications  Medication Dose  Route Frequency Provider Last Rate Last Dose  . acetaminophen (TYLENOL) tablet 650 mg  650 mg Oral Q6H PRN Kerry Hough, PA-C      . alum & mag hydroxide-simeth (MAALOX/MYLANTA) 200-200-20 MG/5ML suspension 30 mL  30 mL Oral Q4H PRN Kerry Hough, PA-C      . benztropine (COGENTIN) tablet 1 mg  1 mg Oral BID Kerry Hough, PA-C   1 mg at 08/21/13 5621  . haloperidol (HALDOL) tablet 10 mg  10 mg Oral BID Kerry Hough, PA-C   10 mg at 08/21/13 3086  . hydrOXYzine (ATARAX/VISTARIL) tablet 50 mg  50 mg Oral Q6H PRN Kerry Hough, PA-C      . lamoTRIgine (LAMICTAL) tablet 100 mg  100 mg Oral Daily Kerry Hough, PA-C   100 mg at 08/21/13 5784  . magnesium hydroxide (MILK OF MAGNESIA) suspension 30 mL  30 mL Oral Daily PRN Kerry Hough, PA-C      . traZODone (DESYREL) tablet 100 mg  100 mg Oral QHS Kerry Hough, PA-C   100 mg at 08/20/13 2204    Lab Results: No results found for this or any previous visit (from the past 48 hour(s)).  Physical Findings: AIMS: Facial and Oral Movements Muscles of Facial Expression: None, normal Lips and Perioral Area: None, normal Jaw: None, normal Tongue: None, normal,Extremity Movements Upper (arms, wrists, hands, fingers): None, normal Lower (legs, knees, ankles, toes): None, normal, Trunk Movements Neck, shoulders, hips: None, normal, Overall Severity Severity of abnormal movements (highest score from questions above): None, normal Incapacitation due to abnormal movements: None, normal Patient's awareness of abnormal movements (rate  only patient's report): No Awareness, Dental Status Current problems with teeth and/or dentures?: No Does patient usually wear dentures?: No  CIWA:    COWS:     Treatment Plan Summary: Daily contact with patient to assess and evaluate symptoms and progress in treatment Medication management  Plan: Treatment Plan/Recommendations:   1. Admit for crisis management and stabilization. 2. Medication  management to reduce current symptoms to base line and improve the patient's overall level of functioning. Continue his current treatment plan and monitor for the side effects 3. Treat health problems as indicated. 4. Develop treatment plan to decrease risk of relapse upon discharge and to reduce the need for readmission. 5. Psycho-social education regarding relapse prevention and self care. 6. Health care follow up as needed for medical problems. 7. Restart home medications where appropriate. 8. Disposition plans are in progress and may be discharged on Tuesday when he can continue to show clinical improvement and safety contract   Medical Decision Making Problem Points:  Established problem, worsening (2), New problem, with no additional work-up planned (3), Review of last therapy session (1) and Review of psycho-social stressors (1) Data Points:  Review or order clinical lab tests (1) Review or order medicine tests (1) Review of medication regiment & side effects (2) Review of new medications or change in dosage (2)  I certify that inpatient services furnished can reasonably be expected to improve the patient's condition.   Ryan Bartlett,Ryan Bartlett. 08/21/2013, 12:37 PM

## 2013-08-21 NOTE — Tx Team (Signed)
Interdisciplinary Treatment Plan Update   Date Reviewed:  08/21/2013  Time Reviewed:  10:09 AM  Progress in Treatment:   Attending groups: Yes Participating in groups: Yes Taking medication as prescribed: Yes  Tolerating medication: Yes Family/Significant other contact made: No, but will ask patient for consent for collateral contact Patient understands diagnosis: Yes  Discussing patient identified problems/goals with staff: Yes Medical problems stabilized or resolved: Yes Denies suicidal/homicidal ideation: Yes Patient has not harmed self or others: Yes  For review of initial/current patient goals, please see plan of care.  Estimated Length of Stay:    Reasons for Continued Hospitalization:  Anxiety Depression Medication stabilization - New Problems/Goals identified:    Discharge Plan or Barriers:   Home with outpatient follow up with [facility]  Additional Comments:  Ryan Bartlett [facility] after medical clearance for bipolar disorder most recent episode depression, auditory hallucinations, suicide ideations and self-injurious behaviors. Patient went for assessment yesterxday and I told them he wanted to cut myself." Patient states that the cutting he wanted to do was to relaease stress and unable to contract for safety patient also reported he has suicidal thoughts". Patient biggest trigger is that he will start back to school at the beginning of the year and he doesn't feel good about it because he has a learning disability and is unsure how he will do in school.   Attendees:  Patient:  08/21/2013 10:09 AM   Signature: Mervyn GayJ. Jonnalagadda, MD 08/21/2013 10:09 AM  Signature: Claudette Headonrad Withrow, NP  08/21/2013 10:09 AM  Signature: Harold Barbanonecia Byrd, RN 08/21/2013 10:09 AM  Signature: Conley SimmondsJesse Goble, RN 08/21/2013 10:09 AM  Signature:   08/21/2013 10:09 AM  Signature:  Juline PatchQuylle Loney Peto, [name] 08/21/2013 10:09 AM  Signature:  Reyes Ivanhelsea Horton, [name]  08/21/2013 10:09 AM  Signature:  Leisa LenzValerie Enoch, Care Coordinator 08/21/2013 10:09 AM  Signature:  Waynetta SandyJan Wright, RN 08/21/2013 10:09 AM  Signature: Leighton ParodyBritney Tyson, RN 08/21/2013  10:09 AM  Signature:    08/21/2013  10:09 AM  Signature:   08/21/2013  10:09 AM    Scribe for Treatment Team:   Juline PatchQuylle Jameire Kouba,  08/21/2013 10:09 AM

## 2013-08-21 NOTE — [facility] Group Notes (Signed)
[name]   08/21/2013 10:51 AM    Participation Quality:  Appropraite  Mood/Affect:  Appropriate  Depression Rating:  1  Anxiety Rating:  2  Thoughts of Suicide:  No  Will you contract for safety?   NA  Current AVH:  No  Plan for Discharge/Comments:  Patient attended discharge planning group and actively participated in group. He advised of living with mother and [facility] provided all participants with daily workbook.   Transportation Means: Patient has transportation.   Supports:  Patient has a support system.   Damier Disano, Joesph JulyQuylle Hairston

## 2013-08-21 NOTE — [facility] Group Notes (Signed)
[facility] [name] Group Therapy  Feelings Around Relapse 1:15 -2:30        08/21/2013  3:27 PM   Type of Therapy:  Group Therapy  Participation Level:  Did not attend group.  Wynn BankerHodnett, Adarsh Mundorf Hairston 08/21/2013 3:27 PM

## 2013-08-22 DIAGNOSIS — F1994 Other psychoactive substance use, unspecified with psychoactive substance-induced mood disorder: Secondary | ICD-10-CM

## 2013-08-22 MED ORDER — NICOTINE POLACRILEX 2 MG MT GUM
2.0000 mg | CHEWING_GUM | OROMUCOSAL | Status: DC | PRN
  Administered 2013-08-22 – 2013-08-25 (×7): 2 mg via ORAL
  Filled 2013-08-22 (×2): qty 1

## 2013-08-22 MED ORDER — NICOTINE POLACRILEX 2 MG MT GUM
CHEWING_GUM | OROMUCOSAL | Status: AC
  Administered 2013-08-22: 16:00:00
  Filled 2013-08-22: qty 1

## 2013-08-22 NOTE — Progress Notes (Signed)
[facility] Group Notes:  (Nursing/MHT/Case Management/Adjunct)  Date:  08/22/2013  Time:  12:24 AM  Type of Therapy:  Group Therapy  Participation Level:  Minimal  Participation Quality:  Appropriate  Affect:  Blunted  Cognitive:  Appropriate  Insight:  Appropriate  Engagement in Group:  Developing/Improving  Modes of Intervention:  Socialization and Support  Summary of Progress/Problems: Pt. Participated in group.  Pt. Stated he would take medicine to prevent relapse.  Sondra ComeWilson, Lenward Able J 08/22/2013, 12:24 AM

## 2013-08-22 NOTE — [facility] Group Notes (Signed)
[facility] Group Notes:  (Clinical Social Work)  08/22/2013   3:00-4:00PM  Summary of Progress/Problems:   The main focus of today's process group was for the patient to identify ways in which they have sabotaged their own mental health wellness/recovery.  Motivational interviewing was used to explore the reasons they engage in this behavior, and reasons they may have for wanting to change.  The Stages of Change were explained to the group using a handout, and patients identified where they are with regard to changing self-defeating behaviors.  The patient expressed that he was hospitalized with thoughts of cutting himself, did not say whether he actually acted on these thoughts.  Type of Therapy:  Process Group  Participation Level:  Active  Participation Quality:  Inattentive  Affect:  Blunted  Cognitive:  Oriented  Insight:  Lacking and Limited  Engagement in Therapy:  Limited  Modes of Intervention:  Education, Motivational Interviewing   Ambrose MantleMareida Grossman-Orr, [name] 08/22/2013, 4:00pm

## 2013-08-22 NOTE — ED Provider Notes (Signed)
  Medical screening examination/treatment/procedure(s) were performed by non-physician practitioner and as supervising physician I was immediately available for consultation/collaboration.  EKG Interpretation   None          Gizell Danser, MD 08/22/13 0058 

## 2013-08-22 NOTE — Progress Notes (Signed)
.  Psychoeducational Group Note    Date: 08/22/2013 Time:  0930    Goal Setting Purpose of Group: To be able to set a goal that is measurable and that can be accomplished in one day Participation Level:  Active  Participation Quality:  Appropriate  Affect:  Appropriate  Cognitive:  Oriented  Insight:  Improving  Engagement in Group:  Engaged  Additional Comments:  Engaged and participated in the group  Jesse Nosbisch A 

## 2013-08-22 NOTE — Progress Notes (Signed)
Patient ID: Ryan Bartlett, male   DOB: October 26, 1995, 18 y.o.   MRN: [id number] [facility] MD Progress Note  08/22/2013 3:30 PM DHRUV CHRISTINA  MRN:  [id number]  Subjective:  Patient was lying in bed, stated his sleep was not good last night but sleeps most of the day.  Staff encouraged him to go to group and stay awake so he will sleep tonight, he did get out of bed and go to the dayroom.  Appetite is "good", depression low, denies hallucinations.  Patient forwards little information. Diagnosis:   DSM5:   Depressive Disorders:  Disruptive Mood Dysregulation Disorder (296.99)  Axis I: ADHD, combined type, Bipolar, Depressed, Substance Induced Mood Disorder and Cannabis abuse  ADL's:  Impaired  Sleep: Poor  Appetite:  Poor  Suicidal Ideation:  Patient has suicidal ideation under contract for the safety only in the hospital  Homicidal Ideation:  Denied  Psychiatric Specialty Exam: ROS  Blood pressure 122/84, pulse 94, temperature 97.7 F (36.5 C), temperature source Oral, resp. rate 14, height 5\' 7"  (1.702 m), weight 92.987 kg (205 lb).Body mass index is 32.1 kg/(m^2).  General Appearance: Disheveled  Eye Contact::  Minimal  Speech:  Normal  Volume:  Decreased  Mood:  Anxious and Depressed  Affect:  Constricted and Depressed  Thought Process:  Coherent   Orientation:  Full (Time, Place, and Person)  Thought Content:  WDL  Suicidal Thoughts:  Yes.  with intent/plan  Homicidal Thoughts:  No  Memory:  Immediate;   Fair  Judgement:  Impaired  Insight:  Lacking  Psychomotor Activity:  Psychomotor Retardation  Concentration:  Fair  Recall:  Fair  Akathisia:  NA  Handed:  Right  AIMS (if indicated):     Assets:  Communication Skills Desire for Improvement Physical Health Resilience Social Support  Sleep:  Number of Hours: 6.75   Current Medications: Current Facility-Administered Medications  Medication Dose Route Frequency Provider Last Rate Last Dose  . acetaminophen  (TYLENOL) tablet 650 mg  650 mg Oral Q6H PRN Kerry Hough, PA-C      . alum & mag hydroxide-simeth (MAALOX/MYLANTA) 200-200-20 MG/5ML suspension 30 mL  30 mL Oral Q4H PRN Kerry Hough, PA-C      . benztropine (COGENTIN) tablet 1 mg  1 mg Oral BID Kerry Hough, PA-C   1 mg at 08/22/13 0841  . haloperidol (HALDOL) tablet 10 mg  10 mg Oral BID Kerry Hough, PA-C   10 mg at 08/22/13 0841  . hydrOXYzine (ATARAX/VISTARIL) tablet 50 mg  50 mg Oral Q6H PRN Kerry Hough, PA-C   50 mg at 08/21/13 2119  . lamoTRIgine (LAMICTAL) tablet 100 mg  100 mg Oral Daily Kerry Hough, PA-C   100 mg at 08/22/13 0841  . magnesium hydroxide (MILK OF MAGNESIA) suspension 30 mL  30 mL Oral Daily PRN Kerry Hough, PA-C      . nicotine polacrilex (NICORETTE) 2 MG gum           . nicotine polacrilex (NICORETTE) gum 2 mg  2 mg Oral PRN Nehemiah Settle, MD      . traZODone (DESYREL) tablet 100 mg  100 mg Oral QHS Kerry Hough, PA-C   100 mg at 08/21/13 2215    Lab Results: No results found for this or any previous visit (from the past 48 hour(s)).  Physical Findings: AIMS: Facial and Oral Movements Muscles of Facial Expression: None, normal Lips and Perioral Area: None, normal Jaw:  None, normal Tongue: None, normal,Extremity Movements Upper (arms, wrists, hands, fingers): None, normal Lower (legs, knees, ankles, toes): None, normal, Trunk Movements Neck, shoulders, hips: None, normal, Overall Severity Severity of abnormal movements (highest score from questions above): None, normal Incapacitation due to abnormal movements: None, normal Patient's awareness of abnormal movements (rate only patient's report): No Awareness, Dental Status Current problems with teeth and/or dentures?: No Does patient usually wear dentures?: No  CIWA:    COWS:     Treatment Plan Summary: Daily contact with patient to assess and evaluate symptoms and progress in treatment Medication  management  Plan: Treatment Plan/Recommendations:   1. Admit for crisis management and stabilization. 2. Medication management to reduce current symptoms to base line and improve the patient's overall level of functioning. Continue his current treatment plan and monitor for the side effects 3. Treat health problems as indicated. 4. Develop treatment plan to decrease risk of relapse upon discharge and to reduce the need for readmission. 5. Psycho-social education regarding relapse prevention and self care. 6. Health care follow up as needed for medical problems. 7. Restart home medications where appropriate. 8. Disposition plans are in progress and may be discharged on Tuesday when he can continue to show clinical improvement and safety contract   Medical Decision Making Problem Points:  Established problem, worsening (2), New problem, with no additional work-up planned (3), Review of last therapy session (1) and Review of psycho-social stressors (1) Data Points:  Review or order clinical lab tests (1) Review or order medicine tests (1) Review of medication regiment & side effects (2) Review of new medications or change in dosage (2)  I certify that inpatient services furnished can reasonably be expected to improve the patient's condition.   Nanine MeansLORD, JAMISON, PMH-NP 08/22/2013, 3:30 PM Agree with assessment and plan Madie RenoIrving A. Dub MikesLugo, M.D.

## 2013-08-22 NOTE — Progress Notes (Signed)
Adult Psychoeducational Group Note  Date:  08/22/2013 Time:  10:45 PM  Group Topic/Focus:  Wrap-Up Group:   The focus of this group is to help patients review their daily goal of treatment and discuss progress on daily workbooks.  Participation Level:  Active  Participation Quality:  Attentive  Affect:  Appropriate  Cognitive:  Alert  Insight: Appropriate  Engagement in Group:  Engaged  Modes of Intervention:  Discussion  Additional Comments:  Patient states that he had an awesome day. Says that he has no goals that he is working on.  Percell LocusJOHNSON,TAWANA 08/22/2013, 10:45 PM

## 2013-08-22 NOTE — Progress Notes (Signed)
D) Pt states that he is concerned about being discharged by Monday States "I have to go to court and I have to start my G[facility] classes [facility] [employer]. Rates his depression and hopelessness both at a 1 and denies SI and HI.  A) Given support and praise to Pt for wanting to continue his education. Encouraged to talk with his doctor to make sure that he will be able to make his commitments.  R) Pt denies SI and HI. Denies auditory and visual hallucinations.

## 2013-08-22 NOTE — Progress Notes (Signed)
Psychoeducational Group Note  Date: 08/22/2013 Time:  1015  Group Topic/Focus:  Identifying Needs:   The focus of this group is to help patients identify their personal needs that have been historically problematic and identify healthy behaviors to address their needs.  Participation Level:  Active  Participation Quality:  Appropriate  Affect:  Appropriate  Cognitive:  Oriented  Insight:  Engaged  Engagement in Group:  Engaged  Additional Comments:  Pt participated and is engaged in the group  Javonte Elenes A 

## 2013-08-23 NOTE — Progress Notes (Signed)
Patient has been up and active on the unit, attended group this evening and has voiced no complaints.  Patient is looking forward to discharging soon. Patient currently denies having pain, -si/hi/a/v hall. Support and encouragement offered, safety maintained on unit, will continue to monitor.

## 2013-08-23 NOTE — Progress Notes (Signed)
D) Pt rates his depression and hopelessness both at a 1. Denies SI and HI.  States that he wants to leave sot hat he can start school and attend his court hearing. Affect and mood are appropriate. Has trouble sitting in groups and gets up, leaves and then comes back in to listen some more. Did participate in two of the groups earlier this morning. Focused on his discharge. A) Given support, reassurance and praise. Encourged to attend his groups and work on his workbook R) Denies SI and HI.

## 2013-08-23 NOTE — Progress Notes (Signed)
Patient ID: Ryan Bartlett [place], male   DOB: November 28, 1995, 18 y.o.   MRN: [id number] Patient ID: Ryan Bartlett Carrier, male   DOB: November 28, 1995, 18 y.o.   [id number] [facility]  08/23/2013 2:44 PM Ryan Bartlett Slaby  MRN:  [id number]  Subjective:  Patient was sleeping most of the day and stated he did not sleep well again--encouraged to go to group which he will willingly.  It is just when he has free time, he sleeps.  Appetite is good, depression is better.  He feels ready to discharge tomorrow and plans to talk to his mother or grandfather or call the crisis line when he gets depressed in the future. Diagnosis:   DSM5:   Depressive Disorders:  Disruptive Mood Dysregulation Disorder (296.99)  Axis I: ADHD, combined type, Bipolar, Depressed, Substance Induced Mood Disorder and Cannabis abuse  ADL's:  Impaired  Sleep: Poor  Appetite:  Poor  Suicidal Ideation:  Denied Homicidal Ideation:  Denied  Psychiatric Specialty Exam: ROS  Blood pressure 95/64, pulse 86, temperature 98 F (36.7 C), temperature source Oral, resp. rate 17, height 5\' 7"  (1.702 m), weight 92.987 kg (205 lb).Body mass index is 32.1 kg/(m^2).  General Appearance: Neat  Eye Contact::  Fair  Speech:  Normal  Volume:  Decreased  Mood:  Anxious and Depressed  Affect:  Constricted and Depressed  Thought Process:  Coherent   Orientation:  Full (Time, Place, and Person)  Thought Content:  WDL  Suicidal Thoughts: No  Homicidal Thoughts:  No  Memory:  Immediate;   Fair  Judgement:  Fair  Insight:  Fair  Psychomotor Activity:  WDL  Concentration:  Fair  Recall:  Fair  Akathisia:  NA  Handed:  Right  AIMS (if indicated):     Assets:  Communication Skills Desire for Improvement Physical Health Resilience Social Support  Sleep:  Number of Hours: 6   Current Medications: Current Facility-Administered Medications  Medication Dose Route Frequency Provider Last Rate Last Dose  . acetaminophen (TYLENOL) tablet 650  mg  650 mg Oral Q6H PRN Kerry HoughSpencer E Simon, PA-C      . alum & mag hydroxide-simeth (MAALOX/MYLANTA) 200-200-20 MG/5ML suspension 30 mL  30 mL Oral Q4H PRN Kerry HoughSpencer E Simon, PA-C      . benztropine (COGENTIN) tablet 1 mg  1 mg Oral BID Kerry HoughSpencer E Simon, PA-C   1 mg at 08/23/13 [id number]  . haloperidol (HALDOL) tablet 10 mg  10 mg Oral BID Kerry HoughSpencer E Simon, PA-C   10 mg at 08/23/13 [id number]  . hydrOXYzine (ATARAX/VISTARIL) tablet 50 mg  50 mg Oral Q6H PRN Kerry HoughSpencer E Simon, PA-C   50 mg at 08/22/13 2107  . lamoTRIgine (LAMICTAL) tablet 100 mg  100 mg Oral Daily Kerry HoughSpencer E Simon, PA-C   100 mg at 08/23/13 [id number]  . magnesium hydroxide (MILK OF MAGNESIA) suspension 30 mL  30 mL Oral Daily PRN Kerry HoughSpencer E Simon, PA-C      . nicotine polacrilex (NICORETTE) gum 2 mg  2 mg Oral PRN Nehemiah SettleJanardhaha R Jonnalagadda, MD   2 mg at 08/23/13 0909  . traZODone (DESYREL) tablet 100 mg  100 mg Oral QHS Kerry HoughSpencer E Simon, PA-C   100 mg at 08/21/13 2215    Lab Results: No results found for this or any previous visit (from the past 48 hour(s)).  Physical Findings: AIMS: Facial and Oral Movements Muscles of Facial Expression: None, normal Lips and Perioral Area: None, normal Jaw: None, normal Tongue: None, normal,Extremity Movements  Upper (arms, wrists, hands, fingers): None, normal Lower (legs, knees, ankles, toes): None, normal, Trunk Movements Neck, shoulders, hips: None, normal, Overall Severity Severity of abnormal movements (highest score from questions above): None, normal Incapacitation due to abnormal movements: None, normal Patient's awareness of abnormal movements (rate only patient's report): No Awareness, Dental Status Current problems with teeth and/or dentures?: No Does patient usually wear dentures?: No  CIWA:    COWS:     Treatment Plan Summary: Daily contact with patient to assess and evaluate symptoms and progress in treatment Medication management  Plan: Treatment Plan/Recommendations:   1. Admit for crisis  management and stabilization. 2. Medication management to reduce current symptoms to base line and improve the patient's overall level of functioning. Continue his current treatment plan and monitor for the side effects 3. Treat health problems as indicated. 4. Develop treatment plan to decrease risk of relapse upon discharge and to reduce the need for readmission. 5. Psycho-social education regarding relapse prevention and self care. 6. Health care follow up as needed for medical problems. 7. Restart home medications where appropriate. 8. Disposition plans are in progress and may be discharged on Tuesday when he can continue to show clinical improvement and safety contract   Medical Decision Making Problem Points:  Established problem, worsening (2), New problem, with no additional work-up planned (3), Review of last therapy session (1) and Review of psycho-social stressors (1) Data Points:  Review or order clinical lab tests (1) Review or order medicine tests (1) Review of medication regiment & side effects (2) Review of new medications or change in dosage (2)  I certify that inpatient services furnished can reasonably be expected to improve the patient's condition.   Nanine Means, PMH-NP 08/23/2013, 2:44 PM Agree with assessment and plan Madie Reno A. Dub Mikes, M.D.

## 2013-08-23 NOTE — [facility] Group Notes (Signed)
[facility] Group Notes: (Clinical Social Work)   08/23/2013      Type of Therapy:  Group Therapy   Participation Level:  Did Not Attend    Bobby Ragan Grossman-Orr, [name] 08/23/2013, 3:10 PM     

## 2013-08-23 NOTE — Progress Notes (Signed)
Patient has been up and active on the unit and has voiced no complaints. Patient currently denies having pain, -si/hi/a/v hall. Patient is hopeful for discharge on tomorrow to take care of his court dates and the starting of school.  Support and encouragement offered, safety maintained on unit, will continue to monitor.

## 2013-08-23 NOTE — Progress Notes (Signed)
[facility] Group Notes:  (Nursing/MHT/Case Management/Adjunct)  Date:  08/23/2013  Time:  8:00 p.m.   Type of Therapy:  Psychoeducational Skills  Participation Level:  Minimal  Participation Quality:  Resistant  Affect:  Appropriate  Cognitive:  Lacking  Insight:  Lacking  Engagement in Group:  Lacking  Modes of Intervention:  Education  Summary of Progress/Problems: The patient only shared in group this evening that he attended the groups today. His mother and "granddad" will serve as his support system.   [name] S 08/23/2013, 10:02 PM

## 2013-08-23 NOTE — Progress Notes (Signed)
Psychoeducational Group Note  Date: 08/23/2013 Time:  0930 Group Topic/Focus:  Gratefulness:  The focus of this group is to help patients identify what two things they are most grateful for in their lives. What helps ground them and to center them on their work to their recovery.  Participation Level:  Active  Participation Quality:  Appropriate  Affect:  Appropriate  Cognitive:  Oriented  Insight:  Improving  Engagement in Group:  Engaged  Additional Comments:  Pt. Was active in paying attention and participating.  Marvena Tally A  

## 2013-08-23 NOTE — Progress Notes (Signed)
Psychoeducational Group Note  Date:  08/23/2013 Time:  1015  Group Topic/Focus:  Making Healthy Choices:   The focus of this group is to help patients identify negative/unhealthy choices they were using prior to admission and identify positive/healthier coping strategies to replace them upon discharge.  Participation Level:  Active  Participation Quality:  Appropriate  Affect:  Appropriate  Cognitive:  Oriented  Insight:  Improving  Engagement in Group:  Engaged  Additional Comments:  Pt has been very much a part of the group with interaction, comments insights and questions.  Rosemary Mossbarger A 08/23/2013 

## 2013-08-24 NOTE — [facility] Group Notes (Signed)
[name]   08/24/2013 10:45 AM    Participation Quality:  Appropraite  Mood/Affect:  Appropriate  Depression Rating:  0  Anxiety Rating:  0  Thoughts of Suicide:  No  Will you contract for safety?   NA  Current AVH:  No  Plan for Discharge/Comments:  Patient attended discharge planning group and actively participated in group.  He reports doing much better today and hopes to discharge home.  He will follow up with [facility].  CSW provided all participants with daily workbook.   Transportation Means: Patient has transportation.   Supports:  Patient has a support system.   Ryan Bartlett, Ryan Bartlett

## 2013-08-24 NOTE — Progress Notes (Signed)
D: Patient in bed asleep on approach.  Patient woke up to speak with Clinical research associatewriter.  Patient  appears depressed and flat.  Patient states he feels ok.  Patient states he is ready top go home.  Patient denies SI/Ho and denies AVH.   A: Staff to monitor Q 15 mins for safety.  Encouragement and support offered.  No sheduled medications administered patient refused Trazodone.  Vistaril administered prn. R: Patient remains safe on the unit.  Patient attended group tonight.  Patient visible on the unit.  Patient taking administered medications

## 2013-08-24 NOTE — Progress Notes (Signed)
Recreation Therapy Notes  Date: 01.05.2015 Time: 3:00pm Location: [address] Day[address]   Group Topic: Wellness  Goal Area(s) Addresses:  Patient will define components of whole wellness. Patient will verbalize benefit of whole wellness.  Behavioral Response: Disengaged  Intervention: Mind Map.  Activity: As a group patients were asked to identify and define the eight components of whole wellness - Physical, Mental, Emotional, Social, Environmental, Intellectual, Leisure, and Spiritual. Individually patients were asked to identify ways they are investing in each component of whole wellness.   Education: Wellness, Building control surveyorDischarge Planning, Coping Skills  Education Outcome: Acknowledges understanding   Clinical Observations/Feedback: Patient attended group session, but stated he did not want to complete his worksheet that he only wanted to listen to group session. Patient stepped out of group session at approximately 3:15pm without explanation and returned approximately 5 minutes later. Upon return patient was observed to lay on a sofa in the dayroom and close his eyes, as if he was sleeping. Patient did perk up and interact in discussion when points of interest were raised, but returned to previous posture immediately following.   Marykay Lexenise L Jayvier Burgher, LRT/CTRS  Brennan Litzinger L 08/24/2013 4:53 PM
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
## 2013-08-24 NOTE — [facility] Group Notes (Signed)
[facility] [name] Group Therapy          Overcoming Obstacles       1:15 -2:30        08/24/2013   2:36 PM     Type of Therapy:  Group Therapy  Participation Level:  Appropriate  Participation Quality:  Appropriate  Affect:  Appropriate, Alert  Cognitive:  Attentive Appropriate  Insight: Developing/Improving Engaged  Engagement in Therapy: Developing/Imprvoing Engaged  Modes of Intervention:  Discussion Exploration  Education Rapport BuildingProblem-Solving Support  Summary of Progress/Problems:  The main focus of today's group was overcoming obstacles.  He shared the obstacle he has to overcome abusing alcohol/drugs.       Patient able to identify appropriate coping skills.   Ryan Bartlett, Ryan Bartlett 08/24/2013    2:36 PM

## 2013-08-24 NOTE — Progress Notes (Signed)
Patient ID: Ryan Bartlett, male   DOB: [birth year], 18 y.o.   MRN: [id number] Patient ID: Ryan Bartlett, male   DOB: 1996-07-26, 18 y.o.   MRN: [id number] Patient ID: Ryan Bartlett, male   DOB: [birth year], 18 y.o.   MRN: [id number] [facility] MD Progress Note  08/24/2013 12:56 PM Ryan Bartlett  MRN:  [id number]  Subjective:  Patient has been doing better without significant emotional or behavioral problems. Patient stated that he has been feeling tired and taking naps during the daytime. Patient has been anxious and worried about his court dates regarding breaking and entering charges and her orderly conduct charges. It is just when he has free time, he sleeps  and stated his appetite is getting better He feels ready to discharge tomorrow and plans to talk to his mother or grandfather or call the crisis line when he gets depressed in the future. 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 rate 18, height 5\' 7"  (1.702 m), weight 92.987 kg (205 lb).Body mass index is 32.1 kg/(m^2).  General Appearance: Neat  Eye Contact::  Fair  Speech:  Normal  Volume:  Decreased  Mood:  Anxious and Depressed  Affect:  Constricted and Depressed  Thought Process:  Coherent   Orientation:  Full (Time, Place, and Person)  Thought Content:  WDL  Suicidal Thoughts: No  Homicidal Thoughts:  No  Memory:  Immediate;   Fair  Judgement:  Fair  Insight:  Fair  Psychomotor Activity:  WDL  Concentration:  Fair  Recall:  Fair  Akathisia:  NA  Handed:  Right  AIMS (if indicated):     Assets:  Communication Skills Desire for  Improvement Physical Health Resilience Social Support  Sleep:  Number of Hours: 6   Current Medications: Current Facility-Administered Medications  Medication Dose Route Frequency Provider Last Rate Last Dose  . acetaminophen (TYLENOL) tablet 650 mg  650 mg Oral Q6H PRN Kerry Hough, PA-C   650 mg at 08/23/13 1820  . alum & mag hydroxide-simeth (MAALOX/MYLANTA) 200-200-20 MG/5ML suspension 30 mL  30 mL Oral Q4H PRN Kerry Hough, PA-C      . benztropine (COGENTIN) tablet 1 mg  1 mg Oral BID Kerry Hough, PA-C   1 mg at 08/24/13 2536  . haloperidol (HALDOL) tablet 10 mg  10 mg Oral BID Kerry Hough, PA-C   10 mg at 08/24/13 6440  . hydrOXYzine (ATARAX/VISTARIL) tablet 50 mg  50 mg Oral Q6H PRN Kerry Hough, PA-C   50 mg at 08/23/13 2136  . lamoTRIgine (LAMICTAL) tablet 100 mg  100 mg Oral Daily Kerry Hough, PA-C   100 mg at 08/24/13 3474  . magnesium hydroxide (MILK OF MAGNESIA) suspension 30 mL  30 mL Oral Daily PRN Kerry Hough, PA-C      . nicotine polacrilex (NICORETTE) gum 2 mg  2 mg Oral PRN Nehemiah Settle, MD   2 mg at 08/23/13 1939  . traZODone (DESYREL) tablet 100 mg  100 mg Oral QHS Kerry Hough, PA-C   100 mg at 08/21/13 2215    Lab Results: No  results found for this or any previous visit (from the past 48 hour(s)).  Physical Findings: AIMS: Facial and Oral Movements Muscles of Facial Expression: None, normal Lips and Perioral Area: None, normal Jaw: None, normal Tongue: None, normal,Extremity Movements Upper (arms, wrists, hands, fingers): None, normal Lower (legs, knees, ankles, toes): None, normal, Trunk Movements Neck, shoulders, hips: None, normal, Overall Severity Severity of abnormal movements (highest score from questions above): None, normal Incapacitation due to abnormal movements: None, normal Patient's awareness of abnormal movements (rate only patient's report): No Awareness, Dental Status Current problems with teeth and/or  dentures?: No Does patient usually wear dentures?: No  CIWA:  CIWA-Ar Total: 1 COWS:  COWS Total Score: 0  Treatment Plan Summary: Daily contact with patient to assess and evaluate symptoms and progress in treatment Medication management  Plan: Treatment Plan/Recommendations:   1. Admit for crisis management and stabilization. 2. Medication management to reduce current symptoms to base line and improve the patient's overall level of functioning. Continue his current treatment plan and monitor for the side effects 3. Treat health problems as indicated. 4. Develop treatment plan to decrease risk of relapse upon discharge and to reduce the need for readmission. 5. Psycho-social education regarding relapse prevention and self care. 6. Health care follow up as needed for medical problems. 7. Restart home medications where appropriate. 8. Disposition plans are in progress and may be discharged on Tuesday when he can continue to show clinical improvement and safety contract   Medical Decision Making Problem Points:  Established problem, worsening (2), New problem, with no additional work-up planned (3), Review of last therapy session (1) and Review of psycho-social stressors (1) Data Points:  Review or order clinical lab tests (1) Review or order medicine tests (1) Review of medication regiment & side effects (2) Review of new medications or change in dosage (2)  I certify that inpatient services furnished can reasonably be expected to improve the patient's condition.   Ryan Bartlett,Ryan Bartlett.08/24/2013, 12:56 PM

## 2013-08-24 NOTE — Tx Team (Signed)
Interdisciplinary Treatment Plan Update   Date Reviewed:  08/24/2013  Time Reviewed:  9:40 AM  Progress in Treatment:   Attending groups: Yes Participating in groups: Yes Taking medication as prescribed: Yes  Tolerating medication: Yes Family/Significant other contact made: Yes, collateral contact made with mother. Patient understands diagnosis: Yes  Discussing patient identified problems/goals with staff: Yes Medical problems stabilized or resolved: Yes Denies suicidal/homicidal ideation: Yes Patient has not harmed self or others: Yes  For review of initial/current patient goals, please see plan of care.  Estimated Length of Stay: one day  Reasons for Continued Hospitalization:  Anxiety Depression Medication stabilization  New Problems/Goals identified:    Discharge Plan or Barriers:   Home with outpatient follow up with [facility]  Additional Comments:  N/A  Attendees:  Patient:  08/24/2013 9:40 AM   Signature: Mervyn GayJ. Jonnalagadda, MD 08/24/2013 9:40 AM  Signature: Claudette Headonrad Withrow, NP  08/24/2013 9:40 AM  Signature: Neill Loftarol Davis, RN 08/24/2013 9:40 AM  Signature:  Quintella ReichertBeverly Knight,  RN 08/24/2013 9:40 AM  Signature:   Tomasita Morrowelora Sutton. Care Coordinator  08/24/2013 9:40 AM  Signature:  Juline PatchQuylle Benino Korinek, [name] 08/24/2013 9:40 AM  Signature:  Reyes Ivanhelsea Horton, [name] 08/24/2013 9:40 AM  Signature:  Leisa LenzValerie Enoch, Care Coordinator 08/24/2013 9:40 AM  Signature:  Manuela SchwartzJennifer Pritchett, RN  08/24/2013 9:40 AM  Signature:  08/24/2013  9:40 AM  Signature:    08/24/2013  9:40 AM  Signature:   08/24/2013  9:40 AM    Scribe for Treatment Team:   Juline PatchQuylle Shaquanda Graves,  08/24/2013 9:40 AM

## 2013-08-24 NOTE — [facility] Suicide Risk Assessment (Signed)
[facility] INPATIENT:  Family/Significant Other Suicide Prevention Education  Suicide Prevention Education:  Education Completed; Ryan SkeenLeslie Bartlett, Mother, [phone]; has been identified by the patient as the family member/significant other with whom the patient will be residing, and identified as the person(s) who will aid the patient in the event of a mental health crisis (suicidal ideations/suicide attempt).  With written consent from the patient, the family member/significant other has been provided the following suicide prevention education, prior to the and/or following the discharge of the patient.  The suicide prevention education provided includes the following:  Suicide risk factors  Suicide prevention and interventions  National Suicide Hotline telephone number  [facility] assessment telephone number  [facility] Emergency Assistance 911  [facility] and/or Residential Mobile Crisis Unit telephone number  Request made of family/significant other to:  Remove weapons (e.g., guns, rifles, knives), all items previously/currently identified as safety concern.  Mother advised patient does not have access to guns.  Remove drugs/medications (over-the-counter, prescriptions, illicit drugs), all items previously/currently identified as a safety concern.  The family member/significant other verbalizes understanding of the suicide prevention education information provided.  The family member/significant other agrees to remove the items of safety concern listed above.  Ryan Bartlett, Ryan Bartlett 08/24/2013, 11:41 AM

## 2013-08-25 MED ORDER — HALOPERIDOL 10 MG PO TABS: 10.0000 mg | ORAL_TABLET | Freq: Two times a day (BID) | ORAL | Status: DC

## 2013-08-25 MED ORDER — HYDROXYZINE HCL 50 MG PO TABS: 50.0000 mg | ORAL_TABLET | Freq: Four times a day (QID) | ORAL | Status: DC | PRN

## 2013-08-25 MED ORDER — BENZTROPINE MESYLATE 1 MG PO TABS: 1.0000 mg | ORAL_TABLET | Freq: Two times a day (BID) | ORAL | Status: DC

## 2013-08-25 MED ORDER — LAMOTRIGINE 100 MG PO TABS: 100.0000 mg | ORAL_TABLET | Freq: Every day | ORAL | Status: DC

## 2013-08-25 MED ORDER — TRAZODONE HCL 100 MG PO TABS
100.0000 mg | ORAL_TABLET | Freq: Every evening | ORAL | Status: DC | PRN
Start: 2013-08-25 — End: 2013-09-25

## 2013-08-25 NOTE — Progress Notes (Signed)
D:  Patient's self inventory sheet, patient has fair sleep, improving appetite, normal energy level, improving attention span.  Denied depression, hopelessness, anxiety.  Denied withdrawals.  Denied SI.  Denied physical problems.  Plans to return home after discharge.  No problems taking meds after discharge. A:  Medications administered per MD orders.  Emotional support and encouragement given patient. R:  Denied SI and HI.  Denied A/V hallucinations.  Denied pain.  Will continue to monitor patient for safety with 15 minute checks.  Safety maintained.

## 2013-08-25 NOTE — [facility] Group Notes (Signed)
[facility] [name] Group Therapy  08/25/2013  1:15 PM   Type of Therapy:  Group Therapy  Participation Level:  Active  Participation Quality:  Attentive, Sharing and Supportive  Affect:  Calm  Cognitive:  Alert and Oriented  Insight:  Developing/Improving and Engaged  Engagement in Therapy:  Developing/Improving and Engaged  Modes of Intervention:  Clarification, Confrontation, Discussion, Education, Exploration, Limit-setting, Orientation, Problem-solving, Rapport Building, Dance movement psychotherapisteality Testing, Socialization and Support  Summary of Progress/Problems: The topic for group therapy was feelings about diagnosis.  Pt actively participated in group discussion on their past and current diagnosis and how they feel towards this.  Pt also identified how society and family members judge them, based on their diagnosis as well as stereotypes and stigmas.   Pt shared that he has been diagnosed with ADHD and bipolar disorder and accepts these diagnoses.  Pt was challenged by group members on the way he worded it, as he called himself bipolar.  Pt was receptive to feedback.  Pt actively listened to group discussion.    Ryan IvanChelsea Horton, [name] 08/25/2013 2:14 PM

## 2013-08-25 NOTE — Progress Notes (Signed)
[facility] :  Will you be returning to the same living situation after discharge: Yes,  home At discharge, do you have transportation home?:Yes,  pt's mother Do you have the ability to pay for your medications:Yes,  medicaid  Release of information consent forms completed and submitted to Medical Records by CSW. Patient to Follow up at: Follow-up Information   Follow up with [facility] ACT Team On 08/25/2013. (Tuesday, August 25, 2013 by 5PM)    Contact information:   [address] [place], [place]  [address]  [phone]      Patient denies SI/HI:   Yes,  during group/self report.    Safety Planning and Suicide Prevention discussed:  Yes,  SPE completed with pt's mother. SPI pamphlet provided to pt.   Smart, HeatherLCSWA  08/25/2013, 11:38 AM

## 2013-08-25 NOTE — Progress Notes (Signed)
Discharge note:  Patient discharged home with his mother.  Denied SI and HI.  Denied A/V hallucinations.  Denied pain.  Suicide prevention information given and discussed with patient who stated he understood and had no questions.  Patient received all his belongings, clothing, toiletries, miscellaneous items, prescriptions, medications.  Patient stated he [facility].

## 2013-08-25 NOTE — Progress Notes (Signed)
Recreation Therapy Notes  Animal-Assisted Activity/Therapy (AAA/T) Program Checklist/Progress Notes Patient Eligibility Criteria Checklist & Daily Group note for Rec Tx Intervention  Date: 01.06.2015 Time: 2:45pm [address]    AAA/T Program Assumption of Risk Form signed by Patient/ or Parent Legal Guardian yes  Patient is free of allergies or sever asthma yes  Patient reports no fear of animals yes  Patient reports no history of cruelty to animals yes   Patient understands his/her participation is voluntary yes  Patient washes hands before animal contact yes  Patient washes hands after animal contact yes  Behavioral Response: Appropriate, Attentive  Education: Hand Washing, Appropriate Animal Interaction   Education Outcome: Acknowledges understanding   Clinical Observations/Feedback: Patient interacted appropriately with therapy dog team and peers. Patient shared stories about his animals and experiences with animals.   Marykay Lexenise L Jameyah Fennewald, LRT/CTRS  Jearl KlinefelterBlanchfield, Ismahan Lippman L 08/25/2013 4:45 PM

## 2013-08-25 NOTE — Discharge Summary (Signed)
Physician Discharge Summary Note  Patient:  Ryan Bartlett is an 18 y.o., male MRN:  [id number] DOB:  1996-06-23 Patient phone:  [phone] (home)  Patient address[place]   [address] [place] [address],   Date of Admission:  08/19/2013 Date of Discharge: 08/25/2013  Reason for Admission:  Bipolar 1, Hallucinations, Suicidal Ideation  Discharge Diagnoses: Active Problems:   Conduct disorder, adolescent onset type   Cannabis abuse   Bipolar 1 disorder  Review of Systems  Constitutional: Negative.   HENT: Negative.   Eyes: Negative.   Respiratory: Negative.   Cardiovascular: Negative.   Gastrointestinal: Negative.   Genitourinary: Negative.   Musculoskeletal: Negative.   Skin: Negative.   Neurological: Negative.   Endo/Heme/Allergies: Negative.   Psychiatric/Behavioral: Negative.     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  AXIS II:  Cluster B Traits AXIS III:   Past Medical History  Diagnosis Date  . ADHD (attention deficit hyperactivity disorder)   . Unspecified episodic mood disorder   . Oppositional defiant disorder   . Bipolar disorder   . Depression    AXIS IV:  other psychosocial or environmental problems and problems related to social environment AXIS V:  61-70 mild symptoms  Level of Care:  OP  Hospital Course:   Ryan Bartlett [facility] after medical clearance for bipolar disorder most recent episode depression, auditory hallucinations, suicide ideations and self-injurious behaviors. Patient went for assessment yesterxday and I  told them he wanted to cut myself." Patient states that the cutting he wanted to do was to relaease stress and unable to contract for safety patient also reported he has suicidal thoughts". Patient biggest trigger is that he will start back to school at the beginning of the year and he doesn't feel good about it because he has a learning disability and is unsure how he will do in school. Patient states that he gets stress from home with his 2 sisters. Patient states that he has a good family and his mother is very supportive. Patient states that he doesn't feel safe going back home and would feel better with inpatient treatment and learning more coping skills to prevent self injury (cutting), and reportedly has a breaking and entering charge. Patient to urine drug screen was negative for drugs of Abuse, but has a history of smoking marijuana.  During Hospitalization: Medications managed, psychoeducation, group and individual therapy. Pt currently denies SI, HI, and Psychosis.   Consults:  psychiatry  Significant Diagnostic Studies:  None  Discharge Vitals:   Blood pressure 117/76, pulse 80, temperature 97.6 F (36.4 C), temperature source Oral, resp. rate 16, height 5\' 7"  (1.702 m), weight 92.987 kg (205 lb). Body mass index is 32.1 kg/(m^2). Lab Results:   No results found for this or any previous visit (from the past 72 hour(s)).  Physical Findings: AIMS: Facial and Oral Movements Muscles of Facial Expression: None, normal Lips and Perioral Area: None, normal Jaw: None, normal Tongue: None, normal,Extremity Movements Upper (arms, wrists, hands, fingers): None, normal Lower (legs, knees, ankles, toes): None, normal, Trunk Movements Neck, shoulders, hips: None, normal, Overall Severity Severity of abnormal movements (highest score from questions above): None, normal Incapacitation  due to abnormal movements: None, normal Patient's awareness of abnormal movements (rate only patient's  report): No Awareness, Dental Status Current problems with teeth and/or dentures?: No Does patient usually wear dentures?: No  CIWA:  CIWA-Ar Total: 1 COWS:  COWS Total Score: 0  Psychiatric Specialty Exam: See Psychiatric Specialty Exam and Suicide Risk Assessment completed by Attending Physician prior to discharge.  Discharge destination:  Home  Is patient on multiple antipsychotic therapies at discharge:  No   Has Patient had three or more failed trials of antipsychotic monotherapy by history:  No  Recommended Plan for Multiple Antipsychotic Therapies: NA     Medication List    STOP taking these medications       HALDOL IJ      TAKE these medications     Indication   benztropine 1 MG tablet  Commonly known as:  COGENTIN  Take 1 tablet (1 mg total) by mouth 2 (two) times daily.   Indication:  Extrapyramidal Reaction caused by Medications     haloperidol 10 MG tablet  Commonly known as:  HALDOL  Take 1 tablet (10 mg total) by mouth 2 (two) times daily.   Indication:  mood stabilization     hydrOXYzine 50 MG tablet  Commonly known as:  ATARAX/VISTARIL  Take 1 tablet (50 mg total) by mouth every 6 (six) hours as needed for anxiety (agitation).   Indication:  anxiety     lamoTRIgine 100 MG tablet  Commonly known as:  LAMICTAL  Take 1 tablet (100 mg total) by mouth daily.   Indication:  mood stabilization     traZODone 100 MG tablet  Commonly known as:  DESYREL  Take 1 tablet (100 mg total) by mouth at bedtime as needed for sleep.   Indication:  Trouble Sleeping           Follow-up Information   Follow up with [facility] On 08/25/2013. (Tuesday, August 25, 2013 by 5PM)    Contact information:   [address] [place], [place]  [address]  [phone]      Follow-up recommendations:  Activity:  As tolerated Diet:  Heart healthy with low sodium.  Comments:   Take all medications as prescribed. Keep all follow-up appointments as  scheduled.  Do not consume alcohol or use illegal drugs while on prescription medications. Report any adverse effects from your medications to your primary care provider promptly.  In the event of recurrent symptoms or worsening symptoms, call 911, a crisis hotline, or go to the nearest emergency department for evaluation.    Total Discharge Time:  Greater than 30 minutes.  Signed: Beau Fanny, FNP-BC 08/25/2013, 12:06 PM  Patient was seen for psychiatric evaluation and suicide risk assessment and formulated treatment plan. Case was discussed with the physician extender, treatment team and made disposition plan,  and reviewed the information documented and agree with the treatment plan.  Ryan Bartlett,JANARDHAHA R. 08/26/2013 10:13 AM

## 2013-08-25 NOTE — Progress Notes (Signed)
The focus of this group is to educate the patient on the purpose and policies of crisis stabilization and provide a format to answer questions about their admission.  The group details unit policies and expectations of patients while admitted.  Patient attended 0900 nurse education orientation group this morning.  Patient listened attentively, appropriate affect, alert, appropriate insight and engagement.  Today patient will work on 3 goals for discharge.  

## 2013-08-25 NOTE — [facility] Suicide Risk Assessment (Signed)
Suicide Risk Assessment  Discharge Assessment     Demographic Factors:  Male, Adolescent or young adult, Low socioeconomic status and Unemployed  Mental Status Per Nursing Assessment::   On Admission:  NA  Current Mental Status by Physician: Mental Status Examination: Patient appeared as per his stated age, casually dressed, and fairly groomed, and maintaining good eye contact. Patient has good mood and his affect was constricted. He has normal rate, rhythm, and volume of speech. His thought process is linear and goal directed. Patient has denied suicidal, homicidal ideations, intentions or plans. Patient has no evidence of auditory or visual hallucinations, delusions, and paranoia. Patient has fair insight judgment and impulse control.  Loss Factors: Decrease in vocational status and Financial problems/change in socioeconomic status  Historical Factors: Prior suicide attempts, Family history of mental illness or substance abuse and Impulsivity  Risk Reduction Factors:   Sense of responsibility to family, Religious beliefs about death, Living with another person, especially a relative, Positive social support, Positive therapeutic relationship and Positive coping skills or problem solving skills  Continued Clinical Symptoms:  Bipolar Disorder:   Depressive phase Alcohol/Substance Abuse/Dependencies Unstable or Poor Therapeutic Relationship Previous Psychiatric Diagnoses and Treatments Medical Diagnoses and Treatments/Surgeries  Cognitive Features That Contribute To Risk:  Polarized thinking    Suicide Risk:  Minimal: No identifiable suicidal ideation.  Patients presenting with no risk factors but with morbid ruminations; may be classified as minimal risk based on the severity of the depressive symptoms  Discharge Diagnoses:   AXIS I:  Bipolar, Depressed, Conduct Disorder and Cannabis abuse AXIS II:  Deferred AXIS III:   Past Medical History  Diagnosis Date  . ADHD (attention  deficit hyperactivity disorder)   . Unspecified episodic mood disorder   . Oppositional defiant disorder   . Bipolar disorder   . Depression    AXIS IV:  economic problems, occupational problems, other psychosocial or environmental problems, problems related to legal system/crime, problems related to social environment and problems with primary support group AXIS V:  51-60 moderate symptoms  Plan Of Care/Follow-up recommendations:  Activity:  As tolerated Diet:  Regular  Is patient on multiple antipsychotic therapies at discharge:  No   Has Patient had three or more failed trials of antipsychotic monotherapy by history:  No  Recommended Plan for Multiple Antipsychotic Therapies: NA  Myrical Andujo,JANARDHAHA R. 08/25/2013, 12:05 PM

## 2013-08-25 NOTE — Progress Notes (Signed)
Recreation Therapy Notes  INPATIENT RECREATION THERAPY ASSESSMENT  Patient Stressors: Family, Relationship,   Coping Skills: Self-Injury, Exercise,  Leisure Interests: Sports,  Writing  Personal Challenges: Relationships, School Performance  Patient indicated he does not have a physical disability that would prevent participating in recreation therapy group sessions.  Patient listed the following strengths: Good with Animals  Patient indicated he would like to change the following about himself: Self-Harm  Patient listed the following current recreation interests:    Skating     Patient goal for hospitalization: Get help for self-harm  Jearl KlinefelterDenise L Judith Demps, LRT/CTRS  Jearl KlinefelterBlanchfield, Desirie Minteer L 08/25/2013 9:47 AM

## 2013-08-28 NOTE — Progress Notes (Signed)
Patient Discharge Instructions:  After Visit Summary (AVS):   Faxed to:  08/28/13 Discharge Summary Note:   Faxed to:  08/28/13 Psychiatric Admission Assessment Note:   Faxed to:  08/28/13 Suicide Risk Assessment - Discharge Assessment:   Faxed to:  08/28/13 Faxed/Sent to the Next [facility] Care provider:  08/28/13 [facility] @ [phone]  Jerelene ReddenSheena E Concord, 08/28/2013, 2:29 PM

## 2013-09-25 ENCOUNTER — Emergency Department (HOSPITAL_COMMUNITY)
Admission: EM | Admit: 2013-09-25 | Discharge: 2013-09-25 | Disposition: A | Discharge status: Home / Self Care | Payer: Medicaid Other | Attending: Emergency Medicine | Admitting: Emergency Medicine

## 2013-09-25 DIAGNOSIS — Y9301 Activity, walking, marching and hiking: Secondary | ICD-10-CM | POA: Insufficient documentation

## 2013-09-25 DIAGNOSIS — Z79899 Other long term (current) drug therapy: Secondary | ICD-10-CM | POA: Insufficient documentation

## 2013-09-25 DIAGNOSIS — IMO0002 Reserved for concepts with insufficient information to code with codable children: Secondary | ICD-10-CM | POA: Insufficient documentation

## 2013-09-25 DIAGNOSIS — F913 Oppositional defiant disorder: Secondary | ICD-10-CM | POA: Insufficient documentation

## 2013-09-25 DIAGNOSIS — F17200 Nicotine dependence, unspecified, uncomplicated: Secondary | ICD-10-CM | POA: Insufficient documentation

## 2013-09-25 DIAGNOSIS — T733XXA Exhaustion due to excessive exertion, initial encounter: Secondary | ICD-10-CM | POA: Insufficient documentation

## 2013-09-25 DIAGNOSIS — F909 Attention-deficit hyperactivity disorder, unspecified type: Secondary | ICD-10-CM | POA: Insufficient documentation

## 2013-09-25 DIAGNOSIS — S8391XA Sprain of unspecified site of right knee, initial encounter: Secondary | ICD-10-CM

## 2013-09-25 DIAGNOSIS — Y9289 Other specified places as the place of occurrence of the external cause: Secondary | ICD-10-CM | POA: Insufficient documentation

## 2013-09-25 DIAGNOSIS — F319 Bipolar disorder, unspecified: Secondary | ICD-10-CM | POA: Insufficient documentation

## 2013-09-25 MED ORDER — IBUPROFEN 800 MG PO TABS: 800.0000 mg | ORAL_TABLET | Freq: Three times a day (TID) | ORAL | Status: DC | PRN

## 2013-09-25 MED ORDER — IBUPROFEN 800 MG PO TABS
800.0000 mg | ORAL_TABLET | Freq: Once | ORAL | Status: AC
  Administered 2013-09-25: 800 mg via ORAL
  Filled 2013-09-25: qty 1

## 2013-09-25 NOTE — ED Notes (Signed)
Per EMS patient reports to ED for knee pain secondary to right knee "popping out of place then back into place" yesterday at 2000. Pt states this has happened multiple times and pt has been evaluated, PCP does not know cause, no surgical or trauma Hx. Pt states he walked a long way yesterday. Per EMS right patella appears symmetrical to left, no obvious swelling or deformity.

## 2013-09-25 NOTE — Discharge Instructions (Signed)
Joint Sprain °A sprain is a tear or stretch in the ligaments that hold a joint together. Severe sprains may need as long as 3-6 weeks of immobilization and/or exercises to heal completely. Sprained joints should be rested and protected. If not, they can become unstable and prone to re-injury. Proper treatment can reduce your pain, shorten the period of disability, and reduce the risk of repeated injuries. °TREATMENT  °· Rest and elevate the injured joint to reduce pain and swelling. °· Apply ice packs to the injury for 20-30 minutes every 2-3 hours for the next 2-3 days. °· Keep the injury wrapped in a compression bandage or splint as long as the joint is painful or as instructed by your caregiver. °· Do not use the injured joint until it is completely healed to prevent re-injury and chronic instability. Follow the instructions of your caregiver. °· Long-term sprain management may require exercises and/or treatment by a physical therapist. Taping or special braces may help stabilize the joint until it is completely better. °SEEK MEDICAL CARE IF:  °· You develop increased pain or swelling of the joint. °· You develop increasing redness and warmth of the joint. °· You develop a fever. °· It becomes stiff. °· Your hand or foot gets cold or numb. °Document Released: 09/13/2004 Document Revised: 10/29/2011 Document Reviewed: 08/23/2008 °ExitCare® Patient Information ©2014 [employer]. ° °Cryotherapy °Cryotherapy means treatment with cold. Ice or gel packs can be used to reduce both pain and swelling. Ice is the most helpful within the first 24 to 48 hours after an injury or flareup from overusing a muscle or joint. Sprains, strains, spasms, burning pain, shooting pain, and aches can all be eased with ice. Ice can also be used when recovering from surgery. Ice is effective, has very few side effects, and is safe for most people to use. °PRECAUTIONS  °Ice is not a safe treatment option for people with: °· Raynaud's  phenomenon. This is a condition affecting small blood vessels in the extremities. Exposure to cold may cause your problems to return. °· Cold hypersensitivity. There are many forms of cold hypersensitivity, including: °· Cold urticaria. Red, itchy hives appear on the skin when the tissues begin to warm after being iced. °· Cold erythema. This is a red, itchy rash caused by exposure to cold. °· Cold hemoglobinuria. Red blood cells break down when the tissues begin to warm after being iced. The hemoglobin that carry oxygen are passed into the urine because they cannot combine with blood proteins fast enough. °· Numbness or altered sensitivity in the area being iced. °If you have any of the following conditions, do not use ice until you have discussed cryotherapy with your caregiver: °· Heart conditions, such as arrhythmia, angina, or chronic heart disease. °· High blood pressure. °· Healing wounds or open skin in the area being iced. °· Current infections. °· Rheumatoid arthritis. °· Poor circulation. °· Diabetes. °Ice slows the blood flow in the region it is applied. This is beneficial when trying to stop inflamed tissues from spreading irritating chemicals to surrounding tissues. However, if you expose your skin to cold temperatures for too long or without the proper protection, you can damage your skin or nerves. Watch for signs of skin damage due to cold. °HOME CARE INSTRUCTIONS °Follow these tips to use ice and cold packs safely. °· Place a dry or damp towel between the ice and skin. A damp towel will cool the skin more quickly, so you may need to shorten the   time that the ice is used. °· For a more rapid response, add gentle compression to the ice. °· Ice for no more than 10 to 20 minutes at a time. The bonier the area you are icing, the less time it will take to get the benefits of ice. °· Check your skin after 5 minutes to make sure there are no signs of a poor response to cold or skin damage. °· Rest 20  minutes or more in between uses. °· Once your skin is numb, you can end your treatment. You can test numbness by very lightly touching your skin. The touch should be so light that you do not see the skin dimple from the pressure of your fingertip. When using ice, most people will feel these normal sensations in this order: cold, burning, aching, and numbness. °· Do not use ice on someone who cannot communicate their responses to pain, such as small children or people with dementia. °HOW TO MAKE AN ICE PACK °Ice packs are the most common way to use ice therapy. Other methods include ice massage, ice baths, and cryo-sprays. Muscle creams that cause a cold, tingly feeling do not offer the same benefits that ice offers and should not be used as a substitute unless recommended by your caregiver. °To make an ice pack, do one of the following: °· Place crushed ice or a bag of frozen vegetables in a sealable plastic bag. Squeeze out the excess air. Place this bag inside another plastic bag. Slide the bag into a pillowcase or place a damp towel between your skin and the bag. °· Mix 3 parts water with 1 part rubbing alcohol. Freeze the mixture in a sealable plastic bag. When you remove the mixture from the freezer, it will be slushy. Squeeze out the excess air. Place this bag inside another plastic bag. Slide the bag into a pillowcase or place a damp towel between your skin and the bag. °SEEK MEDICAL CARE IF: °· You develop white spots on your skin. This may give the skin a blotchy (mottled) appearance. °· Your skin turns blue or pale. °· Your skin becomes waxy or hard. °· Your swelling gets worse. °MAKE SURE YOU:  °· Understand these instructions. °· Will watch your condition. °· Will get help right away if you are not doing well or get worse. °Document Released: 04/02/2011 Document Revised: 10/29/2011 Document Reviewed: 04/02/2011 °ExitCare® Patient Information ©2014 [employer]. ° °

## 2013-09-25 NOTE — ED Provider Notes (Signed)
CSN: [id number]     Arrival date & time 09/25/13  2536 History   First MD Initiated Contact with Patient 09/25/13 0840     Chief Complaint  Patient presents with  . Knee Pain   (Consider location/radiation/quality/duration/timing/severity/associated sxs/prior Treatment) Patient is a 18 y.o. male presenting with knee pain. The history is provided by the patient. No language interpreter was used.  Knee Pain Location:  Knee Knee location:  R knee Associated symptoms: no fever   Associated symptoms comment:  He reports he walked a lot yesterday (from [facility] to [place]) and started having right knee pain and swelling during that walk. He felt that the knee would 'give out' and cause the knee cap to move to the side, then move back into place. No falls or direct trauma. He reports he has had difficulty with this knee in the past without history of surgery or fracture.   Past Medical History  Diagnosis Date  . ADHD (attention deficit hyperactivity disorder)   . Unspecified episodic mood disorder   . Oppositional defiant disorder   . Bipolar disorder   . Depression    Past Surgical History  Procedure Laterality Date  . No past surgeries     Family History  Problem Relation Age of Onset  . Depression Mother   . Diabetes Other   . Hyperlipidemia Other   . Hypertension Other    History  Substance Use Topics  . Smoking status: Current Every Day Smoker -- 0.50 packs/day for 5 years    Types: Cigarettes  . Smokeless tobacco: Never Used  . Alcohol Use: 3.6 oz/week    6 Cans of beer per week     Comment: rarely    Review of Systems  Constitutional: Negative for fever and chills.  Respiratory: Negative.   Musculoskeletal: Negative.        See HPI  Skin: Negative.  Negative for wound.  Neurological: Negative.  Negative for numbness.    Allergies  Peanut-containing drug products and Lactose intolerance (gi)  Home Medications   Current Outpatient Rx  Name  Route  Sig   Dispense  Refill  . benztropine (COGENTIN) 1 MG tablet   Oral   Take 1 mg by mouth at bedtime.         . haloperidol (HALDOL) 10 MG tablet   Oral   Take 10 mg by mouth at bedtime.         . hydrOXYzine (ATARAX/VISTARIL) 50 MG tablet   Oral   Take 50 mg by mouth at bedtime.         . lamoTRIgine (LAMICTAL) 100 MG tablet   Oral   Take 100 mg by mouth at bedtime.         . traZODone (DESYREL) 100 MG tablet   Oral   Take 100 mg by mouth at bedtime.          BP 105/69  Pulse 62  Temp(Src) 98.1 F (36.7 C) (Oral)  Resp 16  SpO2 100% Physical Exam  Constitutional: He is oriented to person, place, and time. He appears well-developed and well-nourished.  Neck: Normal range of motion.  Pulmonary/Chest: Effort normal.  Musculoskeletal: Normal range of motion.  Right knee is mildly swollen without bony deformity, discoloration or palpable effusion. FROM. Joint stable without laxity.  Neurological: He is alert and oriented to person, place, and time.  Skin: Skin is warm and dry.  Psychiatric: He has a normal mood and affect.    ED Course  Procedures (including critical care time) Labs Review Labs Reviewed - No data to display Imaging Review No results found.  EKG Interpretation   None       MDM  No diagnosis found. 1. Right knee sprain  Knee immobilizer and crutches given. Will treat with anti-inflammatories, supportive care and PCP follow up for persistent symptoms.     Arnoldo HookerShari A Kimoni Pickerill, PA-C 09/25/13 [phone]

## 2013-09-25 NOTE — ED Notes (Signed)
Bed: WA21 Expected date:  Expected time:  Means of arrival:  Comments: EMS-knee pain 

## 2013-09-25 NOTE — ED Provider Notes (Signed)
Medical screening examination/treatment/procedure(s) were performed by non-physician practitioner and as supervising physician I was immediately available for consultation/collaboration.  EKG Interpretation   None       Devoria AlbeIva Edon Hoadley, MD, Armando GangFACEP   Ward GivensIva L Madigan Rosensteel, MD 09/25/13 [phone]

## 2015-05-30 ENCOUNTER — Encounter (HOSPITAL_COMMUNITY): Payer: Self-pay | Admitting: Emergency Medicine

## 2015-05-30 ENCOUNTER — Emergency Department (HOSPITAL_COMMUNITY)
Admission: EM | Admit: 2015-05-30 | Discharge: 2015-05-31 | Disposition: A | Discharge status: Other Acute Inpatient Hospital | Payer: Medicaid Other | Attending: Emergency Medicine | Admitting: Emergency Medicine

## 2015-05-30 DIAGNOSIS — F319 Bipolar disorder, unspecified: Secondary | ICD-10-CM | POA: Diagnosis present

## 2015-05-30 DIAGNOSIS — M25572 Pain in left ankle and joints of left foot: Secondary | ICD-10-CM | POA: Diagnosis not present

## 2015-05-30 DIAGNOSIS — F17210 Nicotine dependence, cigarettes, uncomplicated: Secondary | ICD-10-CM | POA: Insufficient documentation

## 2015-05-30 DIAGNOSIS — Z79899 Other long term (current) drug therapy: Secondary | ICD-10-CM | POA: Insufficient documentation

## 2015-05-30 DIAGNOSIS — M25561 Pain in right knee: Secondary | ICD-10-CM | POA: Insufficient documentation

## 2015-05-30 DIAGNOSIS — G8929 Other chronic pain: Secondary | ICD-10-CM | POA: Diagnosis not present

## 2015-05-30 DIAGNOSIS — F912 Conduct disorder, adolescent-onset type: Secondary | ICD-10-CM | POA: Diagnosis present

## 2015-05-30 DIAGNOSIS — R45851 Suicidal ideations: Secondary | ICD-10-CM | POA: Diagnosis not present

## 2015-05-30 LAB — CBC WITH DIFFERENTIAL/PLATELET
Basophils Absolute: 0 10*3/uL (ref 0.0–0.1)
Basophils Relative: 0 %
EOS ABS: 0.1 10*3/uL (ref 0.0–0.7)
Eosinophils Relative: 1 %
HEMATOCRIT: 41.5 % (ref 39.0–52.0)
HEMOGLOBIN: 14.3 g/dL (ref 13.0–17.0)
LYMPHS ABS: 2.4 10*3/uL (ref 0.7–4.0)
Lymphocytes Relative: 24 %
MCH: 29 pg (ref 26.0–34.0)
MCHC: 34.5 g/dL (ref 30.0–36.0)
MCV: 84.2 fL (ref 78.0–100.0)
Monocytes Absolute: 0.7 10*3/uL (ref 0.1–1.0)
Monocytes Relative: 7 %
NEUTROS ABS: 6.8 10*3/uL (ref 1.7–7.7)
NEUTROS PCT: 68 %
Platelets: 214 10*3/uL (ref 150–400)
RBC: 4.93 MIL/uL (ref 4.22–5.81)
RDW: 13.5 % (ref 11.5–15.5)
WBC: 10 10*3/uL (ref 4.0–10.5)

## 2015-05-30 LAB — I-STAT CHEM 8, ED
BUN: 14 mg/dL (ref 6–20)
CHLORIDE: 106 mmol/L (ref 101–111)
CREATININE: 0.8 mg/dL (ref 0.61–1.24)
Calcium, Ion: 1.2 mmol/L (ref 1.12–1.23)
Glucose, Bld: 82 mg/dL (ref 65–99)
HEMATOCRIT: 46 % (ref 39.0–52.0)
Hemoglobin: 15.6 g/dL (ref 13.0–17.0)
Potassium: 3.5 mmol/L (ref 3.5–5.1)
Sodium: 143 mmol/L (ref 135–145)
TCO2: 22 mmol/L (ref 0–100)

## 2015-05-30 LAB — ETHANOL: Alcohol, Ethyl (B): <5 mg/dL (ref <5)

## 2015-05-30 MED ORDER — IBUPROFEN 200 MG PO TABS
600.0000 mg | ORAL_TABLET | Freq: Once | ORAL | Status: AC
  Administered 2015-05-30: 600 mg via ORAL
  Filled 2015-05-30: qty 3

## 2015-05-30 MED ORDER — TRAZODONE HCL 100 MG PO TABS
100.0000 mg | ORAL_TABLET | Freq: Every day | ORAL | Status: DC
  Administered 2015-05-30: 100 mg via ORAL
  Filled 2015-05-30: qty 1

## 2015-05-30 MED ORDER — HYDROXYZINE HCL 25 MG PO TABS
50.0000 mg | ORAL_TABLET | Freq: Every day | ORAL | Status: DC
  Administered 2015-05-30 – 2015-05-31 (×2): 50 mg via ORAL
  Filled 2015-05-30 (×2): qty 2

## 2015-05-30 MED ORDER — HALOPERIDOL 5 MG PO TABS
10.0000 mg | ORAL_TABLET | Freq: Every day | ORAL | Status: AC
  Filled 2015-05-30: qty 2

## 2015-05-30 MED ORDER — LAMOTRIGINE 100 MG PO TABS
100.0000 mg | ORAL_TABLET | Freq: Every day | ORAL | Status: DC
  Administered 2015-05-30 – 2015-05-31 (×2): 100 mg via ORAL
  Filled 2015-05-30 (×3): qty 1

## 2015-05-30 MED ORDER — BENZTROPINE MESYLATE 1 MG PO TABS
1.0000 mg | ORAL_TABLET | Freq: Every day | ORAL | Status: DC
  Administered 2015-05-30 – 2015-05-31 (×2): 1 mg via ORAL
  Filled 2015-05-30 (×2): qty 1

## 2015-05-30 NOTE — ED Provider Notes (Addendum)
CSN: [id number]     Arrival date & time 05/30/15  1823 History  By signing my name below, I, Ronney Lion, attest that this documentation has been prepared under the direction and in the presence of Earley Favor, NP. Electronically Signed: Ronney Lion, ED Scribe. 05/30/2015. 8:41 PM.    Chief Complaint  Patient presents with  . Suicidal  . Ankle Pain   The history is provided by the patient. No language interpreter was used.    HPI Comments: Ryan Bartlett is a 19 y.o. male who presents to the Emergency Department complaining of left ankle pain and chronic, arthritic right knee pain that onset 4 hours ago. He reports he normally walks with a limp due to his chronic right knee pain. Patient states he was discharged from his group home after he got a new job that they did not approve of.   Patient also complains of SI. He states he planned to "hang himself or something."  Past Medical History  Diagnosis Date  . ADHD (attention deficit hyperactivity disorder)   . Unspecified episodic mood disorder   . Oppositional defiant disorder   . Bipolar disorder (HCC)   . Depression    Past Surgical History  Procedure Laterality Date  . No past surgeries     Family History  Problem Relation Age of Onset  . Depression Mother   . Diabetes Other   . Hyperlipidemia Other   . Hypertension Other    Social History  Substance Use Topics  . Smoking status: Current Every Day Smoker -- 0.50 packs/day for 5 years    Types: Cigarettes  . Smokeless tobacco: Never Used  . Alcohol Use: 3.6 oz/week    6 Cans of beer per week     Comment: rarely    Review of Systems  Musculoskeletal: Positive for arthralgias.  Psychiatric/Behavioral: Positive for suicidal ideas.  All other systems reviewed and are negative.  Allergies  Peanut-containing drug products and Lactose intolerance (gi)  Home Medications   Prior to Admission medications   Medication Sig Start Date End Date Taking? Authorizing Provider   divalproex (DEPAKOTE ER) 500 MG 24 hr tablet Take 500 mg by mouth daily.   Yes Historical Provider, MD  MELATONIN EXTRA STRENGTH PO Take 1 tablet by mouth at bedtime as needed. For sleep   Yes Historical Provider, MD  omega-3 acid ethyl esters (LOVAZA) 1 G capsule Take 1 g by mouth daily.   Yes Historical Provider, MD  haloperidol (HALDOL) 10 MG tablet Take 10 mg by mouth at bedtime. 08/25/13   Beau Fanny, FNP  hydrOXYzine (ATARAX/VISTARIL) 50 MG tablet Take 50 mg by mouth at bedtime. 08/25/13   Beau Fanny, FNP  ibuprofen (ADVIL,MOTRIN) 800 MG tablet Take 1 tablet (800 mg total) by mouth every 8 (eight) hours as needed. 09/25/13   Elpidio Anis, PA-C  traZODone (DESYREL) 100 MG tablet Take 100 mg by mouth at bedtime. 08/25/13   Everardo All Withrow, FNP   BP 129/73 mmHg  Pulse 52  Temp(Src) 98.2 F (36.8 C) (Oral)  Resp 18  SpO2 100% Physical Exam  Constitutional: He is oriented to person, place, and time. He appears well-developed and well-nourished. No distress.  HENT:  Head: Normocephalic and atraumatic.  Eyes: Conjunctivae and EOM are normal.  Neck: Neck supple. No tracheal deviation present.  Cardiovascular: Normal rate.   Pulmonary/Chest: Effort normal. No respiratory distress.  Musculoskeletal: Normal range of motion.  Neurological: He is alert and oriented to person,  place, and time.  Skin: Skin is warm and dry.  Psychiatric: He has a normal mood and affect. His behavior is normal.  Nursing note and vitals reviewed.   ED Course  Procedures (including critical care time)  DIAGNOSTIC STUDIES: Oxygen Saturation is 97% on RA, normal by my interpretation.    COORDINATION OF CARE: 8:38 PM - Discussed treatment plan with pt at bedside which includes ibuprofen for patient's left ankle pain. Will also consult social worker to help patient find a place to sleep tonight. Pt verbalized understanding and agreed to plan.   MDM   Final diagnoses:  None    I personally performed the  services described in this documentation, which was scribed in my presence. The recorded information has been reviewed and is accurate.    Earley Favor, NP 06/01/15 1956  Arby Barrette, MD 06/02/15 1413  Earley Favor, NP 07/18/15 1955  Arby Barrette, MD 07/20/15 [phone]

## 2015-05-30 NOTE — ED Notes (Addendum)
Per EMS-started having left ankle and right knee pain that happened suddenly about 2 hours ago-patient was recently released from his group home-now homeless
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
## 2015-05-30 NOTE — ED Notes (Signed)
Pt arrived on unit. Pt is alert and oriented. Pt reports SI with a plan to "take all my meds in my book bag, and if that doesn't work to hang myself". Pt denies SI/AVH. Reports feelings of helplessness. Pt rates pain in his knee an 8 out of 10. Ibuprofen given prior to arrival. Pt reports alcohol use every day, saying that he "can't keep count" of how much he drinks a day. Denies drug use due to being on probation. Safety is maintained with q15 minute safety checks.

## 2015-05-30 NOTE — ED Notes (Signed)
RN LM for Social Worker to see and assess Pt.

## 2015-05-31 ENCOUNTER — Inpatient Hospital Stay (HOSPITAL_COMMUNITY)
Admission: AD | Admit: 2015-05-31 | Discharge: 2015-06-09 | DRG: 885 | Disposition: A | Discharge status: Home / Self Care | Payer: Medicaid Other | Source: Intra-hospital | Attending: Psychiatry | Admitting: Psychiatry

## 2015-05-31 DIAGNOSIS — F1020 Alcohol dependence, uncomplicated: Secondary | ICD-10-CM | POA: Diagnosis present

## 2015-05-31 DIAGNOSIS — Z590 Homelessness: Secondary | ICD-10-CM | POA: Diagnosis not present

## 2015-05-31 DIAGNOSIS — F319 Bipolar disorder, unspecified: Principal | ICD-10-CM | POA: Diagnosis present

## 2015-05-31 DIAGNOSIS — F912 Conduct disorder, adolescent-onset type: Secondary | ICD-10-CM | POA: Diagnosis present

## 2015-05-31 DIAGNOSIS — R45851 Suicidal ideations: Secondary | ICD-10-CM | POA: Diagnosis present

## 2015-05-31 DIAGNOSIS — F902 Attention-deficit hyperactivity disorder, combined type: Secondary | ICD-10-CM | POA: Diagnosis present

## 2015-05-31 DIAGNOSIS — F3130 Bipolar disorder, current episode depressed, mild or moderate severity, unspecified: Secondary | ICD-10-CM | POA: Diagnosis not present

## 2015-05-31 DIAGNOSIS — Z833 Family history of diabetes mellitus: Secondary | ICD-10-CM | POA: Diagnosis not present

## 2015-05-31 DIAGNOSIS — F1220 Cannabis dependence, uncomplicated: Secondary | ICD-10-CM | POA: Diagnosis present

## 2015-05-31 DIAGNOSIS — F913 Oppositional defiant disorder: Secondary | ICD-10-CM | POA: Diagnosis present

## 2015-05-31 DIAGNOSIS — Z818 Family history of other mental and behavioral disorders: Secondary | ICD-10-CM

## 2015-05-31 DIAGNOSIS — F17210 Nicotine dependence, cigarettes, uncomplicated: Secondary | ICD-10-CM | POA: Diagnosis present

## 2015-05-31 DIAGNOSIS — G4700 Insomnia, unspecified: Secondary | ICD-10-CM | POA: Diagnosis present

## 2015-05-31 DIAGNOSIS — Z23 Encounter for immunization: Secondary | ICD-10-CM

## 2015-05-31 DIAGNOSIS — Z8249 Family history of ischemic heart disease and other diseases of the circulatory system: Secondary | ICD-10-CM | POA: Diagnosis not present

## 2015-05-31 DIAGNOSIS — R45850 Homicidal ideations: Secondary | ICD-10-CM | POA: Diagnosis not present

## 2015-05-31 DIAGNOSIS — F4001 Agoraphobia with panic disorder: Secondary | ICD-10-CM | POA: Diagnosis present

## 2015-05-31 MED ORDER — HYDROXYZINE HCL 50 MG PO TABS
50.0000 mg | ORAL_TABLET | Freq: Every day | ORAL | Status: DC
  Filled 2015-05-31 (×2): qty 1

## 2015-05-31 MED ORDER — TRAZODONE HCL 100 MG PO TABS
100.0000 mg | ORAL_TABLET | Freq: Every day | ORAL | Status: DC
  Administered 2015-06-01: 100 mg via ORAL
  Filled 2015-05-31 (×4): qty 1

## 2015-05-31 MED ORDER — BENZTROPINE MESYLATE 1 MG PO TABS
1.0000 mg | ORAL_TABLET | Freq: Every day | ORAL | Status: DC
  Filled 2015-05-31 (×2): qty 1

## 2015-05-31 MED ORDER — ACETAMINOPHEN 325 MG PO TABS
650.0000 mg | ORAL_TABLET | Freq: Four times a day (QID) | ORAL | Status: DC | PRN
  Administered 2015-06-01 – 2015-06-07 (×9): 650 mg via ORAL
  Filled 2015-05-31 (×9): qty 2

## 2015-05-31 MED ORDER — LAMOTRIGINE 100 MG PO TABS
100.0000 mg | ORAL_TABLET | Freq: Every day | ORAL | Status: DC
Start: 2015-06-01 — End: 2015-06-01
  Filled 2015-05-31 (×2): qty 1

## 2015-05-31 MED ORDER — ALUM & MAG HYDROXIDE-SIMETH 200-200-20 MG/5ML PO SUSP: 30.0000 mL | ORAL | Status: DC | PRN

## 2015-05-31 MED ORDER — HYDROXYZINE HCL 50 MG PO TABS
50.0000 mg | ORAL_TABLET | Freq: Every day | ORAL | Status: DC
  Filled 2015-05-31: qty 1

## 2015-05-31 MED ORDER — MAGNESIUM HYDROXIDE 400 MG/5ML PO SUSP
30.0000 mL | Freq: Every day | ORAL | Status: DC | PRN
  Administered 2015-06-03: 30 mL via ORAL
  Filled 2015-05-31: qty 30

## 2015-05-31 MED ORDER — LAMOTRIGINE 100 MG PO TABS
100.0000 mg | ORAL_TABLET | Freq: Every day | ORAL | Status: DC
  Filled 2015-05-31: qty 1

## 2015-05-31 NOTE — ED Notes (Signed)
Patient has been resting in the bed without any behavioral dyscontrol.  Patient reported that he has been having suicidal ideations for the last 30 days with thoughts of overdosing, but without intent.  Patient has recently became homeless after discharge from his group home on 05/30/15 and reports having no place to go.  Patient denies that his living situation has increased his desire to harm himself.  Patient denies AH/VH and has been able to contract for safety.

## 2015-05-31 NOTE — Progress Notes (Signed)
Writer reviewing pt for possible admission to [facility].    05/31/2015 Cheryl Flash, MS, NCC, LPCA Therapeutic Triage Specialist

## 2015-05-31 NOTE — BH Assessment (Signed)
[facility] Assessment Progress Note  Per Thedore Mins, MD, this pt requires psychiatric hospitalization at this time.  Berneice Heinrich, RN, [facility] has assigned pt to Rm [id number].  Pt has signed Voluntary Admission and Consent for Treatment, as well as Consent to Release Information to Sherrin Daisy at [facility] in [place], and to Dr. B at [facility] in [facility], and notification calls have been placed.  Signed forms have been faxed to [facility].  Pt's nurse, Rayfield Citizen, has been notified, and agrees to send original paperwork along with pt via Juel Burrow, and to call report to [phone].  Doylene Canning, MA Triage Specialist [phone]

## 2015-05-31 NOTE — ED Notes (Signed)
Patient resting with positive respirations.

## 2015-05-31 NOTE — ED Notes (Signed)
Pt is alert and oriented. Pt is aware that he will be transferred to [facility] tonight. Reports SI. Denies HI/AVH. No concerns or complaints noted at this time. Safety is maintained with q15 minute safety checks.

## 2015-05-31 NOTE — Progress Notes (Signed)
CSW received consult for homelessness and shelter needs. At this time, pt recommended for inpatient psychiatric treatment due to [facility] with plan to overdose or hang himself. CSW to follow up with patient regarding shelter once seen by psychiatrist.  Olga Coaster, [name]  Clinical Social Work  [facility] [phone]

## 2015-05-31 NOTE — Consult Note (Addendum)
[facility] Face-to-Face Psychiatry Consult   Reason for Consult:  Suicide ideation, Alcohol use disorder,   Referring Physician:  EDP Patient Identification: Ryan Bartlett MRN:  [id number] Principal Diagnosis: Bipolar 1 disorder [facility]) Diagnosis:   Patient Active Problem List   Diagnosis Date Noted  . Bipolar 1 disorder (HCC) [F31.9] 08/19/2013    Priority: High  . Bipolar I disorder, most recent episode (or current) depressed, severe, without mention of psychotic behavior [F31.4] 05/23/2013  . Cannabis abuse [F12.10] 03/30/2012  . ADHD (attention deficit hyperactivity disorder), combined type [F90.2] 08/28/2011  . Conduct disorder, adolescent onset type [F91.2] 08/28/2011    Total Time spent with patient: 1 hour  Subjective:   Ryan Bartlett is a 19 y.o. male patient admitted with Schizoaffective disorder, Bipolar type  HPI:  [name], 19 years old was evaluated for feeling suicidal with plans to OD on his medications.  He reports previous suicide attempt by OD but could not say when he last attempted suicide.  Patient was discharged from a [facility] yesterday where he has been staying as a teenager.  He was let go yesterday because he recently started a job.  Patient reports that he has been feeling suicidal and his plan is to OD on pills.  He also reports drinking Alcohol daily while he was in the [facility].  Patient could not quantify his intake of Alcohol.  Patient however is homeless.  He denies HI/AVH.  Patient has been accepted for admission and we will be seeking placement at any facility with available bed  Past Psychiatric History:  Schizoaffective disorder, Bipolar type  Risk to Self: Suicidal Ideation: Yes-Currently Present Suicidal Intent: Yes-Currently Present Is patient at risk for suicide?: Yes Suicidal Plan?: Yes-Currently Present Specify Current Suicidal Plan: Overdose or hang self Access to Means: Yes Specify Access to Suicidal Means: Access to meds. What has been your use of  drugs/alcohol within the last 12 months?: ETOH or THC How many times?:  (Multiple) Other Self Harm Risks: Some cutting Triggers for Past Attempts: Unpredictable Intentional Self Injurious Behavior: Cutting Comment - Self Injurious Behavior: Some hx of cutting in last 3 months Risk to Others: Homicidal Ideation: No Thoughts of Harm to Others: No Current Homicidal Intent: No Current Homicidal Plan: No Access to Homicidal Means: No Identified Victim: No one History of harm to others?: Yes Assessment of Violence: In distant past Violent Behavior Description: No fights in a year or more Does patient have access to weapons?: Yes (Comment) (Could get one if he wanted one.) Criminal Charges Pending?: No Does patient have a court date: No Prior Inpatient Therapy: Prior Inpatient Therapy: Yes Prior Therapy Dates: Sometime in 2016 Prior Therapy Facilty/Provider(s): [facility] Reason for Treatment: SI & detox Prior Outpatient Therapy: Prior Outpatient Therapy: Yes Prior Therapy Dates: 1.5 years / Last two months Prior Therapy Facilty/Provider(s): [facility] Reason for Treatment: Med managent & counseling Does patient have an ACCT team?: No (Used to have [facility].) Does patient have Intensive In-House Services?  : No Does patient have [facility] services? : No Does patient have P4CC services?: No  Past Medical History:  Past Medical History  Diagnosis Date  . ADHD (attention deficit hyperactivity disorder)   . Unspecified episodic mood disorder   . Oppositional defiant disorder   . Bipolar disorder (HCC)   . Depression     Past Surgical History  Procedure Laterality Date  . No past surgeries     Family History:  Family History  Problem Relation Age of Onset  . Depression Mother   . Diabetes Other   . Hyperlipidemia Other   . Hypertension Other    Family Psychiatric  History: Patient does not know about his family medical or Psychiatric history Social  History:  History  Alcohol Use  . 3.6 oz/week  . 6 Cans of beer per week    Comment: rarely     History  Drug Use No    Comment: pt reports a hx of THC use    Social History   Social History  . Marital Status: Single    Spouse Name: N/A  . Number of Children: N/A  . Years of Education: N/A   Occupational History  . student     12th grade at [employer]   Social History Main Topics  . Smoking status: Current Every Day Smoker -- 0.50 packs/day for 5 years    Types: Cigarettes  . Smokeless tobacco: Never Used  . Alcohol Use: 3.6 oz/week    6 Cans of beer per week     Comment: rarely  . Drug Use: No     Comment: pt reports a hx of THC use  . Sexual Activity:    Partners: Female    Copy: Condom     Comment: Pt reports that he is not sexually active   Other Topics Concern  . None   Social History Narrative   Additional Social History:    Pain Medications: See PTA medication list Prescriptions: See PTA medication list Over the Counter: See PTA medication list History of alcohol / drug use?: Yes Name of Substance 1: Marijuana 1 - Age of First Use: 19 years of age 12 - Amount (size/oz): One blunt in 3 months 1 - Frequency: First time on 10/06 1 - Duration: One relapst in 3 months 1 - Last Use / Amount: 10/06 Name of Substance 2: ETOH 2 - Age of First Use: 16 years  2 - Amount (size/oz): Varies between a beer or two or a pint or more of liquor 2 - Frequency: Daily 2 - Duration: Last month and a half 2 - Last Use / Amount: 10/08                 Allergies:   Allergies  Allergen Reactions  . Peanut-Containing Drug Products Anaphylaxis  . Lactose Intolerance (Gi) Diarrhea and Nausea And Vomiting    Labs:  Results for orders placed or performed during the hospital encounter of 05/30/15 (from the past 48 hour(s))  CBC with Differential     Status: None   Collection Time: 05/30/15  8:57 PM  Result Value Ref Range   WBC 10.0 4.0 - 10.5  K/uL   RBC 4.93 4.22 - 5.81 MIL/uL   Hemoglobin 14.3 13.0 - 17.0 g/dL   HCT 16.1 09.6 - 04.5 %   MCV 84.2 78.0 - 100.0 fL   MCH 29.0 26.0 - 34.0 pg   MCHC 34.5 30.0 - 36.0 g/dL   RDW 40.9 81.1 - 91.4 %   Platelets 214 150 - 400 K/uL   Neutrophils Relative % 68 %   Neutro Abs 6.8 1.7 - 7.7 K/uL   Lymphocytes Relative 24 %   Lymphs Abs 2.4 0.7 - 4.0 K/uL   Monocytes Relative 7 %   Monocytes Absolute 0.7 0.1 - 1.0 K/uL   Eosinophils Relative 1 %   Eosinophils Absolute 0.1 0.0 - 0.7 K/uL   Basophils Relative  0 %   Basophils Absolute 0.0 0.0 - 0.1 K/uL  Ethanol     Status: None   Collection Time: 05/30/15  8:57 PM  Result Value Ref Range   Alcohol, Ethyl (B) <5 <5 mg/dL    Comment:        LOWEST DETECTABLE LIMIT FOR SERUM ALCOHOL IS 5 mg/dL FOR MEDICAL PURPOSES ONLY   I-stat chem 8, ed     Status: None   Collection Time: 05/30/15  9:07 PM  Result Value Ref Range   Sodium 143 135 - 145 mmol/L   Potassium 3.5 3.5 - 5.1 mmol/L   Chloride 106 101 - 111 mmol/L   BUN 14 6 - 20 mg/dL   Creatinine, Ser 4.09 0.61 - 1.24 mg/dL   Glucose, Bld 82 65 - 99 mg/dL   Calcium, Ion 8.11 1.12 - 1.23 mmol/L   TCO2 22 0 - 100 mmol/L   Hemoglobin 15.6 13.0 - 17.0 g/dL   HCT 91.4 78.2 - 95.6 %    Current Facility-Administered Medications  Medication Dose Route Frequency Provider Last Rate Last Dose  . benztropine (COGENTIN) tablet 1 mg  1 mg Oral QHS Earley Favor, NP   1 mg at 05/30/15 2243  . haloperidol (HALDOL) tablet 10 mg  10 mg Oral QHS Earley Favor, NP   10 mg at 05/30/15 2245  . hydrOXYzine (ATARAX/VISTARIL) tablet 50 mg  50 mg Oral QHS Earley Favor, NP   50 mg at 05/30/15 2242  . lamoTRIgine (LAMICTAL) tablet 100 mg  100 mg Oral QHS Earley Favor, NP   100 mg at 05/30/15 2243  . traZODone (DESYREL) tablet 100 mg  100 mg Oral QHS Earley Favor, NP   100 mg at 05/30/15 2243   Current Outpatient Prescriptions  Medication Sig Dispense Refill  . divalproex (DEPAKOTE ER) 500 MG 24 hr tablet Take  500 mg by mouth daily.    Marland Kitchen MELATONIN EXTRA STRENGTH PO Take 1 tablet by mouth at bedtime as needed. For sleep    . omega-3 acid ethyl esters (LOVAZA) 1 G capsule Take 1 g by mouth daily.    . haloperidol (HALDOL) 10 MG tablet Take 10 mg by mouth at bedtime.    . hydrOXYzine (ATARAX/VISTARIL) 50 MG tablet Take 50 mg by mouth at bedtime.    Marland Kitchen ibuprofen (ADVIL,MOTRIN) 800 MG tablet Take 1 tablet (800 mg total) by mouth every 8 (eight) hours as needed. (Patient not taking: Reported on 05/31/2015) 21 tablet 0  . traZODone (DESYREL) 100 MG tablet Take 100 mg by mouth at bedtime.      Musculoskeletal: Strength & Muscle Tone: within normal limits Gait & Station: normal Patient leans: N/A  Psychiatric Specialty Exam: Review of Systems  Constitutional: Negative.   HENT: Negative.   Eyes: Negative.   Cardiovascular: Negative.   Gastrointestinal: Negative.   Genitourinary: Negative.   Musculoskeletal: Negative.   Skin: Negative.   Neurological: Negative.   Endo/Heme/Allergies: Negative.     Blood pressure 102/68, pulse 61, temperature 98.1 F (36.7 C), temperature source Oral, resp. rate 16, SpO2 100 %.There is no weight on file to calculate BMI.  General Appearance: Casual  Eye Contact::  Good  Speech:  Clear and Coherent and Normal Rate  Volume:  Normal  Mood:  Anxious  Affect:  Congruent  Thought Process:  Coherent, Goal Directed and Intact  Orientation:  Full (Time, Place, and Person)  Thought Content:  WDL  Suicidal Thoughts:  Yes.  with intent/plan  Homicidal Thoughts:  No  Memory:  Immediate;   Good Recent;   Good Remote;   Good  Judgement:  Fair  Insight:  Fair  Psychomotor Activity:  Normal  Concentration:  Good  Recall:  NA  Fund of Knowledge:Good  Language: Good  Akathisia:  NA  Handed:  Right  AIMS (if indicated):     Assets:  Desire for Improvement  ADL's:  Intact  Cognition: WNL  Sleep:      Treatment Plan Summary: Daily contact with patient to assess and  evaluate symptoms and progress in treatment and Medication management  Disposition: We will administer Trazodone 100 mg po at bed time for sleep, Lamictal 100 mg po daily for mood stabilization, Haldol 10 mg po QHS for mood control and Cogentin 1 mg po QHS for EPS  Earney Navy   PMHNP-BC 05/31/2015 12:26 PM Patient seen face-to-face for psychiatric evaluation, chart reviewed and case discussed with the physician extender and developed treatment plan. Reviewed the information documented and agree with the treatment plan. Thedore Mins, MD

## 2015-06-01 ENCOUNTER — Encounter (HOSPITAL_COMMUNITY): Payer: Self-pay

## 2015-06-01 DIAGNOSIS — F1220 Cannabis dependence, uncomplicated: Secondary | ICD-10-CM

## 2015-06-01 DIAGNOSIS — F3130 Bipolar disorder, current episode depressed, mild or moderate severity, unspecified: Secondary | ICD-10-CM

## 2015-06-01 DIAGNOSIS — F1020 Alcohol dependence, uncomplicated: Secondary | ICD-10-CM

## 2015-06-01 DIAGNOSIS — R45851 Suicidal ideations: Secondary | ICD-10-CM

## 2015-06-01 LAB — RAPID URINE DRUG SCREEN, HOSP PERFORMED
AMPHETAMINES: NOT DETECTED
BENZODIAZEPINES: NOT DETECTED
Barbiturates: NOT DETECTED
COCAINE: NOT DETECTED
Opiates: NOT DETECTED
Tetrahydrocannabinol: NOT DETECTED

## 2015-06-01 MED ORDER — DIVALPROEX SODIUM ER 250 MG PO TB24
750.0000 mg | ORAL_TABLET | Freq: Every day | ORAL | Status: DC
Start: 2015-06-01 — End: 2015-06-03
  Administered 2015-06-01 – 2015-06-02 (×2): 750 mg via ORAL
  Filled 2015-06-01 (×5): qty 3

## 2015-06-01 MED ORDER — LORAZEPAM 1 MG PO TABS: 1.0000 mg | ORAL_TABLET | Freq: Four times a day (QID) | ORAL | Status: AC | PRN

## 2015-06-01 MED ORDER — NICOTINE POLACRILEX 2 MG MT GUM
2.0000 mg | CHEWING_GUM | OROMUCOSAL | Status: DC | PRN
  Administered 2015-06-01 – 2015-06-08 (×21): 2 mg via ORAL
  Filled 2015-06-01 (×8): qty 1

## 2015-06-01 MED ORDER — LOPERAMIDE HCL 2 MG PO CAPS: 2.0000 mg | ORAL_CAPSULE | ORAL | Status: AC | PRN

## 2015-06-01 MED ORDER — LORAZEPAM 1 MG PO TABS
1.0000 mg | ORAL_TABLET | Freq: Three times a day (TID) | ORAL | Status: DC
  Administered 2015-06-02: 1 mg via ORAL
  Filled 2015-06-01 (×3): qty 1

## 2015-06-01 MED ORDER — THIAMINE HCL 100 MG/ML IJ SOLN
100.0000 mg | Freq: Once | INTRAMUSCULAR | Status: AC
  Administered 2015-06-01: 100 mg via INTRAMUSCULAR
  Filled 2015-06-01: qty 2

## 2015-06-01 MED ORDER — LORAZEPAM 1 MG PO TABS: 1.0000 mg | ORAL_TABLET | Freq: Every day | ORAL | Status: DC

## 2015-06-01 MED ORDER — VITAMIN B-1 100 MG PO TABS
100.0000 mg | ORAL_TABLET | Freq: Every day | ORAL | Status: DC
  Administered 2015-06-02 – 2015-06-09 (×8): 100 mg via ORAL
  Filled 2015-06-01 (×10): qty 1

## 2015-06-01 MED ORDER — LORAZEPAM 1 MG PO TABS
1.0000 mg | ORAL_TABLET | Freq: Four times a day (QID) | ORAL | Status: AC
  Administered 2015-06-01 (×3): 1 mg via ORAL
  Filled 2015-06-01 (×3): qty 1

## 2015-06-01 MED ORDER — ADULT MULTIVITAMIN W/MINERALS CH
1.0000 | ORAL_TABLET | Freq: Every day | ORAL | Status: DC
  Administered 2015-06-01 – 2015-06-09 (×9): 1 via ORAL
  Filled 2015-06-01 (×11): qty 1

## 2015-06-01 MED ORDER — HYDROXYZINE HCL 25 MG PO TABS: 25.0000 mg | ORAL_TABLET | Freq: Four times a day (QID) | ORAL | Status: AC | PRN

## 2015-06-01 MED ORDER — ONDANSETRON 4 MG PO TBDP: 4.0000 mg | ORAL_TABLET | Freq: Four times a day (QID) | ORAL | Status: AC | PRN

## 2015-06-01 MED ORDER — ZIPRASIDONE HCL 20 MG PO CAPS
20.0000 mg | ORAL_CAPSULE | Freq: Two times a day (BID) | ORAL | Status: DC
  Administered 2015-06-01 – 2015-06-03 (×4): 20 mg via ORAL
  Filled 2015-06-01 (×9): qty 1

## 2015-06-01 MED ORDER — TRAZODONE HCL 50 MG PO TABS
50.0000 mg | ORAL_TABLET | Freq: Every day | ORAL | Status: DC
  Administered 2015-06-01 – 2015-06-08 (×8): 50 mg via ORAL
  Filled 2015-06-01 (×11): qty 1

## 2015-06-01 MED ORDER — LORAZEPAM 1 MG PO TABS: 1.0000 mg | ORAL_TABLET | Freq: Two times a day (BID) | ORAL | Status: DC

## 2015-06-01 NOTE — Tx Team (Signed)
Initial Interdisciplinary Treatment Plan   PATIENT STRESSORS: Financial difficulties Substance abuse   PATIENT STRENGTHS: Ability for insight Average or above average intelligence General fund of knowledge Motivation for treatment/growth Physical Health   PROBLEM LIST: Problem List/Patient Goals Date to be addressed Date deferred Reason deferred Estimated date of resolution  depression 06/01/2015     Risk for suicide 06/01/2015     "anger issues and stop drinking" 1012/2016     Substance abuse 1012/2016                                    DISCHARGE CRITERIA:  Ability to meet basic life and health needs Adequate post-discharge living arrangements Improved stabilization in mood, thinking, and/or behavior Motivation to continue treatment in a less acute level of care Verbal commitment to aftercare and medication compliance  PRELIMINARY DISCHARGE PLAN: Attend 12-step recovery group Placement in alternative living arrangements Return to previous work or school arrangements  PATIENT/FAMIILY INVOLVEMENT: This treatment plan has been presented to and reviewed with the patient, Maureen Chattersustin T Beckworth, and/or family member,  The patient and family have been given the opportunity to ask questions and make suggestions.  JEHU-APPIAH, Bruce Mayers K 06/01/2015, 12:12 AM

## 2015-06-01 NOTE — Progress Notes (Signed)
D: Pt presents blunted in affect and depressed in mood. Pt reports SI with no plan. Pt verbally contracts for safety. Pt reports feeling restless at times. Pt observed interacting appropriately with the other patient's within the milieu. Pt denied any HI/AVH. A: Writer administered scheduled medications to pt, per MD orders. Pt encouraged to report any excessive drowsiness to the writer. Continued support and availability as needed was extended to this pt. Staff continue to monitor pt with q8315min checks.  R: No adverse drug reactions noted. Pt receptive to treatment. Pt remains safe at this time.

## 2015-06-01 NOTE — [facility] Group Notes (Signed)
[facility] [name] Aftercare Discharge Planning Group Note  06/01/2015 8:45 AM  Pt did not attend, declined invitation.   Iren Whipp Carter, LCSWA 06/01/2015 9:31 AM  

## 2015-06-01 NOTE — [facility] Suicide Risk Assessment (Signed)
[facility]   Nursing information obtained from:   patient and chart  Demographic factors:   19 year old single male  Current Mental Status:   see below  Loss Factors:   recently asked to leave Group Home he had been living in  Historical Factors:   reports history of bipolar disorder, alcohol dependence  Risk Reduction Factors:   resilience, physical health Total Time spent with patient: 45 minutes Principal Problem:  Bipolar Disorder, Depressed, and Alcohol Dependence  Diagnosis:   Patient Active Problem List   Diagnosis Date Noted  . Bipolar disorder with depression (HCC) [F31.30] 05/31/2015  . Bipolar 1 disorder (HCC) [F31.9] 08/19/2013  . Bipolar I disorder, most recent episode (or current) depressed, severe, without mention of psychotic behavior [F31.4] 05/23/2013  . Cannabis abuse [F12.10] 03/30/2012  . ADHD (attention deficit hyperactivity disorder), combined type [F90.2] 08/28/2011  . Conduct disorder, adolescent onset type [F91.2] 08/28/2011     Continued Clinical Symptoms:  Alcohol Use Disorder Identification Test Final Score (AUDIT): 10 The "Alcohol Use Disorders Identification Test", Guidelines for Use in Primary Care, Second Edition.  World Science writerHealth Organization [facility](WHO). Score between 0-7:  no or low risk or alcohol related problems. Score between 8-15:  moderate risk of alcohol related problems. Score between 16-19:  high risk of alcohol related problems. Score 20 or above:  warrants further diagnostic evaluation for alcohol dependence and treatment.   CLINICAL FACTORS:   19 year old male, history of mood disorder, describes history of bipolar  Spectrum disorder, with intense but usually short lived mood swings, and episodes of severe depression. Describes history of impulsivity as well. Has been drinking daily, heavily over the last 2-3 months, and lost his place at Group Home he had been living in due to intoxication /alchol use , resulting in  homelessness.     Psychiatric Specialty Exam: Physical Exam  ROS  Blood pressure 128/62, pulse 81, temperature 98 F (36.7 C), temperature source Oral, resp. rate 20, height 5\' 9"  (1.753 m), weight 232 lb (105.235 kg).Body mass index is 34.24 kg/(m^2).  See admit note MSE                                                        COGNITIVE FEATURES THAT CONTRIBUTE TO RISK:  Closed-mindedness and Loss of executive function    SUICIDE RISK:   Moderate:  Frequent suicidal ideation with limited intensity, and duration, some specificity in terms of plans, no associated intent, good self-control, limited dysphoria/symptomatology, some risk factors present, and identifiable protective factors, including available and accessible social support.  PLAN OF CARE: Patient will be admitted to inpatient psychiatric unit for stabilization and safety. Will provide and encourage milieu participation. Provide medication management and maked adjustments as needed.  Will follow daily.    Medical Decision Making:  Review of Psycho-Social Stressors (1), Review or order clinical lab tests (1), Established Problem, Worsening (2) and Review of New Medication or Change in Dosage (2)  I certify that inpatient services furnished can reasonably be expected to improve the patient's condition.   Ryan Bartlett 06/01/2015, 3:46 PM

## 2015-06-01 NOTE — Progress Notes (Signed)
DAR Note: Ryan Bartlett has been visible on the unit interacting with peers.  He attended a few groups.  He continues to report suicidal thoughts.  He is able to contract for safety on the unit.  He admits that he drinks daily and uses pot daily.  CIWA was 0.  Vital signs have been stable.  He denies any pain at this time.  He appears to be in no physical distress.  He completed his self inventory and reports his depression is 8/10, hopelessness 9/10 and anxiety 9/10.  His goal for today is to work on his anger.  He appears to be in no physical distress.  Q 15 minute checks maintained for safety.

## 2015-06-01 NOTE — [facility] Group Notes (Signed)
[facility] [name] Group Therapy 06/01/2015 1:15 PM  Type of Therapy: Group Therapy- Emotion Regulation  Participation Level: Active   Participation Quality:  Appropriate  Affect: Appropriate  Cognitive: Alert and Oriented   Insight:  Developing/Improving  Engagement in Therapy: Developing/Improving and Engaged   Modes of Intervention: Clarification, Confrontation, Discussion, Education, Exploration, Limit-setting, Orientation, Problem-solving, Rapport Building, Dance movement psychotherapisteality Testing, Socialization and Support  Summary of Progress/Problems: The topic for group today was emotional regulation. This group focused on both positive and negative emotion identification and allowed group members to process ways to identify feelings, regulate negative emotions, and find healthy ways to manage internal/external emotions. Group members were asked to reflect on a time when their reaction to an emotion led to a negative outcome and explored how alternative responses using emotion regulation would have benefited them. Group members were also asked to discuss a time when emotion regulation was utilized when a negative emotion was experienced. Pt was active in group discussion, however most of his participation focused on his activity with his gang and is difficulty controlling his anger. However, he was receptive to feedback but continued to suggest that he "had to live this way." Pt insight is limited.   Ryan CordialLauren Carter, LCSWA 06/01/2015 2:58 PM

## 2015-06-01 NOTE — Progress Notes (Signed)
Patient ID: Ryan Bartlett, male   DOB: 1995-10-19, 19 y.o.   MRN: [id number] Admission note: D:Patient is a voluntary admission in no acute distress for depression and substance abuse. Reported increase depression over been kicked out of group home since turning 19 years. Pt reported he started drinking till he passes out. Pt endorses SI without a plan. Contracted to come to staff before acting on thoughts. Pt reports he is currently looking for another group home. Reports no family support. Goal is work on anger issues and stop drinking.  A: Pt admitted to unit per protocol, skin assessment and belonging search done. No skin issues noted. Consent signed by pt. Pt educated on therapeutic milieu rules. Pt was introduced to milieu by nursing staff. Fall risk safety plan explained to the patient. 15 minutes checks started for safety.  R: Pt was receptive to education. Writer offered support.

## 2015-06-01 NOTE — [facility] Suicide Risk Assessment (Signed)
[facility] INPATIENT:  Family/Significant Other Suicide Prevention Education  Suicide Prevention Education:  Patient Refusal for Family/Significant Other Suicide Prevention Education: The patient Ryan Bartlett has refused to provide written consent for family/significant other to be provided Family/Significant Other Suicide Prevention Education during admission and/or prior to discharge. SPE reviewed with patient and brochure provided. Patient encouraged to return to hospital if having suicidal thoughts, patient verbalized his/her understanding and has no further questions at this time. Physician notified.  Elaina Hoopsarter, Shalah Estelle M 06/01/2015, 12:37 PM

## 2015-06-01 NOTE — Progress Notes (Addendum)
Patient stated when he was 19 yrs old went to jail for stealing copper, kept going back to jail for different reasons, spent total of 6 months in jail for trespass, etc., wore ankle bracelet.  Patient stated he cannot live in group home any longer, does not [place].  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   Quit school in 12th grade, would like to get his GED.  Feels SI to hang himself, contracts for safety, "everything" is working against him now.  Denied HI.  Denied A/V hallucinations.     UA put in lab refrigerator for pick up today.

## 2015-06-01 NOTE — Progress Notes (Signed)
Recreation Therapy Notes  Date: 10.12.2016 Time: 9:30am Location: 300 Hall Group Room   Group Topic: Stress Management  Goal Area(s) Addresses:  Patient will actively participate in stress management techniques presented during session.   Behavioral Response: Did not attend.  Edmundo Tedesco L Evaluna Utke, LRT/CTRS        Declan Mier L 06/01/2015 1:08 PM 

## 2015-06-01 NOTE — H&P (Signed)
Psychiatric Admission Assessment Adult  Patient Identification: Ryan Bartlett MRN:  [id number] Date of Evaluation:  06/01/2015 Chief Complaint:  " depression, wanted to kill myself "  Principal Diagnosis:  Bipolar Disorder, Depressed, Alcohol Dependence, Cannabis Dependence  Diagnosis:   Patient Active Problem List   Diagnosis Date Noted  . Bipolar disorder with depression (HCC) [F31.30] 05/31/2015  . Bipolar 1 disorder (HCC) [F31.9] 08/19/2013  . Bipolar I disorder, most recent episode (or current) depressed, severe, without mention of psychotic behavior [F31.4] 05/23/2013  . Cannabis abuse [F12.10] 03/30/2012  . ADHD (attention deficit hyperactivity disorder), combined type [F90.2] 08/28/2011  . Conduct disorder, adolescent onset type [F91.2] 08/28/2011   History of Present Illness:: 19  Year old single male , was living in a Group Home . States he was kicked out of Group Home two days ago due to heavy drinking, drug abuse .  After leaving group home was supposed to live with a friend, but states that friend had not cleared this with his parents. Patient states he found himself being homeless, with nowhere to go, so started walking , but developed knee pain, due to which he came to the hospital. In ED reported suicidal ideations, with thoughts of overdosing on medications States he has history of depression, and states he has been diagnosed with Bipolar disorder. Describes short term severe mood swings , but states " mostly I feel down".  States he has had suicidal ideations " on and off " over a long period of time . States he has been drinking daily since he turned 19 two months ago. He states he often drinks " a lot "  To point of  gross intoxication " like to where I start falling and  throwing up ". Last drank 2 days ago ( 1/2 case of beer). States he has been smoking cannabis almost daily, but has been trying to stop.   Associated Signs/Symptoms: Depression Symptoms:  depressed  mood, anhedonia, insomnia, suicidal thoughts with specific plan, loss of energy/fatigue, (Hypo) Manic Symptoms:   At this time does not present with or endorse symptoms of mania- does endorse history of mood swings, mood instability Anxiety Symptoms:  Describes panic attacks and some degree of agoraphobia.  Psychotic Symptoms:  Denies  PTSD Symptoms: Denies  Total Time spent with patient: 45 minutes  Past Psychiatric History - has had prior psychiatric admissions , first time 2014. Has had prior suicide attempts by overdosing and hanging self . Describes short term but severe mood swings, usually lasting only a few minutes, hours- does not endorse clear history of discrete periods of mania, denies history of psychosis, at this time denies history of PTSD. Describes history of panic attacks . States he has been taking Depakote, which is prescribed at [facility]. Denies side effects .   Risk to Self: Is patient at risk for suicide?: Yes What has been your use of drugs/alcohol within the last 12 months?: since August, drinking every day to the point of being drunk; drinks 1/5 liquor and case of peer Risk to Others:   Prior Inpatient Therapy:   Prior Outpatient Therapy:    Alcohol Screening: 1. How often do you have a drink containing alcohol?: 4 or more times a week 2. How many drinks containing alcohol do you have on a typical day when you are drinking?: 5 or 6 3. How often do you have six or more drinks on one occasion?: Daily or almost daily Preliminary Score: 6 4. How often during  the last year have you found that you were not able to stop drinking once you had started?: Never 5. How often during the last year have you failed to do what was normally expected from you becasue of drinking?: Never 6. How often during the last year have you needed a first drink in the morning to get yourself going after a heavy drinking session?: Never 7. How often during the last year have you had a feeling of  guilt of remorse after drinking?: Never 8. How often during the last year have you been unable to remember what happened the night before because you had been drinking?: Never 9. Have you or someone else been injured as a result of your drinking?: No 10. Has a relative or friend or a doctor or another health worker been concerned about your drinking or suggested you cut down?: No Alcohol Use Disorder Identification Test Final Score (AUDIT): 10 Brief Intervention: Yes Substance Abuse History in the last 12 months:  Has been drinking daily and heavily , up to two days ago. (+) history of cannabis dependence , last smoked cannabis a few weeks ago.  Consequences of Substance Abuse:  denies any negative consequences, denies blackouts, denies DUIs, denies any history of severe WDL or seizures  Previous Psychotropic Medications: States he has been on Depakote over the last several months . States " I have been on other medications but I can't remember the names " Psychological Evaluations:  No  Past Medical History: States he has been diagnosed with Asthma. Smokes 1/2 PPD. Denies medication allergies  Past Medical History  Diagnosis Date  . ADHD (attention deficit hyperactivity disorder)   . Unspecified episodic mood disorder   . Oppositional defiant disorder   . Bipolar disorder (HCC)   . Depression     Past Surgical History  Procedure Laterality Date  . No past surgeries     Family History:  Minimal interaction with father, whom he says has not been a consistent part of his life, close to mother. Has 6 half siblings .  Family History  Problem Relation Age of Onset  . Depression Mother   . Diabetes Other   . Hyperlipidemia Other   . Hypertension Other    Family Psychiatric  History:  Denies history of mental illness in family, denies any history of suicides in family, father has history of alcohol dependence  Social History:  Single, no children,  Currently homeless after being kicked out  of Group Home, where he had been living for a year. Currently on probation. States he has been in a gang . States he is employed x 1 month.  History  Alcohol Use  . 3.6 oz/week  . 6 Cans of beer per week    Comment: rarely     History  Drug Use No    Comment: pt reports a hx of THC use    Social History   Social History  . Marital Status: Single    Spouse Name: N/A  . Number of Children: N/A  . Years of Education: N/A   Occupational History  . student     12th grade at [employer]   Social History Main Topics  . Smoking status: Current Every Day Smoker -- 0.50 packs/day for 5 years    Types: Cigarettes  . Smokeless tobacco: Never Used  . Alcohol Use: 3.6 oz/week    6 Cans of beer per week     Comment: rarely  . Drug Use:  No     Comment: pt reports a hx of THC use  . Sexual Activity:    Partners: Female    CopyBirth Control/ Protection: Condom     Comment: Pt reports that he is not sexually active   Other Topics Concern  . None   Social History Narrative   Additional Social History:   Allergies:   Allergies  Allergen Reactions  . Peanut-Containing Drug Products Anaphylaxis  . Lactose Intolerance (Gi) Diarrhea and Nausea And Vomiting   Lab Results:  Results for orders placed or performed during the hospital encounter of 05/30/15 (from the past 48 hour(s))  CBC with Differential     Status: None   Collection Time: 05/30/15  8:57 PM  Result Value Ref Range   WBC 10.0 4.0 - 10.5 K/uL   RBC 4.93 4.22 - 5.81 MIL/uL   Hemoglobin 14.3 13.0 - 17.0 g/dL   HCT 69.641.5 29.539.0 - 28.452.0 %   MCV 84.2 78.0 - 100.0 fL   MCH 29.0 26.0 - 34.0 pg   MCHC 34.5 30.0 - 36.0 g/dL   RDW 13.213.5 44.011.5 - 10.215.5 %   Platelets 214 150 - 400 K/uL   Neutrophils Relative % 68 %   Neutro Abs 6.8 1.7 - 7.7 K/uL   Lymphocytes Relative 24 %   Lymphs Abs 2.4 0.7 - 4.0 K/uL   Monocytes Relative 7 %   Monocytes Absolute 0.7 0.1 - 1.0 K/uL   Eosinophils Relative 1 %   Eosinophils Absolute 0.1 0.0 - 0.7  K/uL   Basophils Relative 0 %   Basophils Absolute 0.0 0.0 - 0.1 K/uL  Ethanol     Status: None   Collection Time: 05/30/15  8:57 PM  Result Value Ref Range   Alcohol, Ethyl (B) <5 <5 mg/dL    Comment:        LOWEST DETECTABLE LIMIT FOR SERUM ALCOHOL IS 5 mg/dL FOR MEDICAL PURPOSES ONLY   I-stat chem 8, ed     Status: None   Collection Time: 05/30/15  9:07 PM  Result Value Ref Range   Sodium 143 135 - 145 mmol/L   Potassium 3.5 3.5 - 5.1 mmol/L   Chloride 106 101 - 111 mmol/L   BUN 14 6 - 20 mg/dL   Creatinine, Ser 7.250.80 0.61 - 1.24 mg/dL   Glucose, Bld 82 65 - 99 mg/dL   Calcium, Ion 3.661.20 1.12 - 1.23 mmol/L   TCO2 22 0 - 100 mmol/L   Hemoglobin 15.6 13.0 - 17.0 g/dL   HCT 44.046.0 34.739.0 - 42.552.0 %    Metabolic Disorder Labs:  Lab Results  Component Value Date   HGBA1C 6.0* 05/14/2012   MPG 126* 05/14/2012   MPG 117* 04/01/2012   No results found for: PROLACTIN Lab Results  Component Value Date   CHOL 174* 05/14/2012   TRIG 83 05/14/2012   HDL 39 05/14/2012   CHOLHDL 4.5 05/14/2012   VLDL 17 05/14/2012   LDLCALC 118* 05/14/2012   LDLCALC 111* 04/01/2012    Current Medications: Current Facility-Administered Medications  Medication Dose Route Frequency Provider Last Rate Last Dose  . acetaminophen (TYLENOL) tablet 650 mg  650 mg Oral Q6H PRN Earney NavyJosephine C Onuoha, NP   650 mg at 06/01/15 1042  . alum & mag hydroxide-simeth (MAALOX/MYLANTA) 200-200-20 MG/5ML suspension 30 mL  30 mL Oral Q4H PRN Earney NavyJosephine C Onuoha, NP      . benztropine (COGENTIN) tablet 1 mg  1 mg Oral QHS Earney NavyJosephine C Onuoha, NP      .  hydrOXYzine (ATARAX/VISTARIL) tablet 50 mg  50 mg Oral QHS Rockey Situ Marche Hottenstein, MD      . lamoTRIgine (LAMICTAL) tablet 100 mg  100 mg Oral QHS Rechy Bost A Chevette Fee, MD      . magnesium hydroxide (MILK OF MAGNESIA) suspension 30 mL  30 mL Oral Daily PRN Earney Navy, NP      . nicotine polacrilex (NICORETTE) gum 2 mg  2 mg Oral PRN Craige Cotta, MD   2 mg at 06/01/15 1045   . traZODone (DESYREL) tablet 100 mg  100 mg Oral QHS Earney Navy, NP   100 mg at 06/01/15 0004   PTA Medications: Prescriptions prior to admission  Medication Sig Dispense Refill Last Dose  . divalproex (DEPAKOTE ER) 500 MG 24 hr tablet Take 500 mg by mouth daily.   05/31/2015 at Unknown time  . haloperidol (HALDOL) 10 MG tablet Take 10 mg by mouth at bedtime.   05/31/2015 at Unknown time  . hydrOXYzine (ATARAX/VISTARIL) 50 MG tablet Take 50 mg by mouth at bedtime.     Marland Kitchen ibuprofen (ADVIL,MOTRIN) 800 MG tablet Take 1 tablet (800 mg total) by mouth every 8 (eight) hours as needed. 21 tablet 0   . MELATONIN EXTRA STRENGTH PO Take 1 tablet by mouth at bedtime as needed. For sleep     . omega-3 acid ethyl esters (LOVAZA) 1 G capsule Take 1 g by mouth daily.     . traZODone (DESYREL) 100 MG tablet Take 100 mg by mouth at bedtime.   06/01/2015 at Unknown time    Musculoskeletal: Strength & Muscle Tone: within normal limits Gait & Station: normal Patient leans: N/A  Psychiatric Specialty Exam: Physical Exam  Review of Systems  Constitutional: Negative.   HENT: Negative.   Eyes: Negative.   Respiratory: Negative.   Cardiovascular: Negative.   Gastrointestinal: Negative.   Genitourinary: Negative.   Musculoskeletal: Negative.   Skin: Negative.   Neurological: Negative for seizures.  Endo/Heme/Allergies: Negative.   Psychiatric/Behavioral: Positive for depression, suicidal ideas and substance abuse.  all other systems negative  Blood pressure 128/62, pulse 81, temperature 98 F (36.7 C), temperature source Oral, resp. rate 20, height 5\' 9"  (1.753 m), weight 232 lb (105.235 kg).Body mass index is 34.24 kg/(m^2).  General Appearance: Fairly Groomed  Patent attorney::  Good  Speech:  Normal Rate  Volume:  Decreased  Mood:  Depressed  Affect:  Constricted and Depressed  Thought Process:  Linear  Orientation:  Full (Time, Place, and Person)  Thought Content:  denies hallucinations,  no delusions, not internally preoccupied  Suicidal Thoughts:  Yes.  without intent/plan at this time denies any self injurious ideations or any SI- contracts for safety on the unit   Homicidal Thoughts:  No  Memory:  recent and remote grossly intact   Judgement:  Fair  Insight:  Fair  Psychomotor Activity:  Decreased  Concentration:  Good  Recall:  Good  Fund of Knowledge:Good  Language: Good  Akathisia:  Negative  Handed:  Right  AIMS (if indicated):     Assets:  Desire for Improvement Resilience  ADL's:   Fair   Cognition: WNL  Sleep:  Number of Hours: 5     Treatment Plan Summary: Daily contact with patient to assess and evaluate symptoms and progress in treatment, Medication management, Plan inpatient admission and medications as below  Observation Level/Precautions:  15 minute checks  Laboratory:  HbAIC TSH, Lipid Panel,VAlproic Acid Serum level, EKG  Psychotherapy:  Milieu, support  Medications:  We discussed options, and decided on following 1. Continue Depakote ER - patient states no side effects, but current dose " not helping much". Will restart at 750 mgrs QHS , and obtain level. 2. Start Geodon  20 mgrs BID for Bipolar Disorder, mood swings . 3. Due to history of heavy daily drinking , reports of feeling " jittery " off alcohol, will start Ativan Detox protocol to minimize risk of ETOH WDL. 4. D/C Lamictal   Consultations:  As needed   Discharge Concerns:   homelessness  Estimated LOS: 6 days   Other:     I certify that inpatient services furnished can reasonably be expected to improve the patient's condition.   Ellice Boultinghouse 10/12/201612:08 PM
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
## 2015-06-02 LAB — HEPATIC FUNCTION PANEL
ALBUMIN: 4.2 g/dL (ref 3.5–5.0)
ALK PHOS: 61 U/L (ref 38–126)
ALT: 15 U/L — AB (ref 17–63)
AST: 16 U/L (ref 15–41)
BILIRUBIN TOTAL: 0.4 mg/dL (ref 0.3–1.2)
Bilirubin, Direct: <0.1 mg/dL — ABNORMAL LOW (ref 0.1–0.5)
Total Protein: 7.6 g/dL (ref 6.5–8.1)

## 2015-06-02 LAB — LIPID PANEL
CHOLESTEROL: 199 mg/dL (ref 0–200)
HDL: 31 mg/dL — AB (ref >40)
LDL Cholesterol: 90 mg/dL (ref 0–99)
TRIGLYCERIDES: 390 mg/dL — AB (ref <150)
Total CHOL/HDL Ratio: 6.4 RATIO
VLDL: 78 mg/dL — ABNORMAL HIGH (ref 0–40)

## 2015-06-02 LAB — VALPROIC ACID LEVEL: Valproic Acid Lvl: 30 ug/mL — ABNORMAL LOW (ref 50.0–100.0)

## 2015-06-02 LAB — TSH: TSH: 3.181 u[IU]/mL (ref 0.350–4.500)

## 2015-06-02 NOTE — Tx Team (Signed)
Interdisciplinary Treatment Plan Update (Adult) Date: 06/02/2015   Date: 06/02/2015 1:43 PM  Progress in Treatment:  Attending groups: Yes  Participating in groups: Yes  Taking medication as prescribed: Yes  Tolerating medication: Yes  Family/Significant othe contact made: No, Pt declines family contact Patient understands diagnosis: Yes Discussing patient identified problems/goals with staff: Yes  Medical problems stabilized or resolved: Yes  Denies suicidal/homicidal ideation: Yes Patient has not harmed self or Others: Yes   New problem(s) identified: None identified at this time.   Discharge Plan or Barriers: Pt is requesting residential treatment; [facility] referral made  Additional comments: n/a   Reason for Continuation of Hospitalization:  Anxiety Depression Medication stabilization Suicidal ideation  Estimated length of stay: 3-5 days  Review of initial/current patient goals per problem list:   1.  Goal(s): Patient will participate in aftercare plan  Met:  Yes  Target date: 3-5 days from date of admission   As evidenced by: Patient will participate within aftercare plan AEB aftercare provider and housing plan at discharge being identified.   06/02/15: Pt will go to residential substance abuse treatment.   2.  Goal (s): Patient will exhibit decreased depressive symptoms and suicidal ideations.  Met:  No  Target date: 3-5 days from date of admission   As evidenced by: Patient will utilize self rating of depression at 3 or below and demonstrate decreased signs of depression or be deemed stable for discharge by MD. Pt was admitted with symptoms of depression, rating 10/10. Pt continues to present with flat affect and depressive symptoms.  Pt will demonstrate decreased symptoms of depression and rate depression at 3/10 or lower prior to discharge.  3.  Goal(s): Patient will demonstrate decreased signs and symptoms of anxiety.  Met:  No  Target date: 3-5 days  from date of admission   As evidenced by: Patient will utilize self rating of anxiety at 3 or below and demonstrated decreased signs of anxiety, or be deemed stable for discharge by MD 06/02/15: Pt was admitted with increased levels of anxiety and is currently rating those symptoms highly. Pt will demonstrated decreased symptoms of anxiety and rate it at 3/10 prior to d/c.  Attendees:  Patient:    Family:    Physician: Dr. Parke Poisson, MD  06/02/2015 1:43 PM  Nursing: Lars Pinks, RN Case manager  06/02/2015 1:43 PM  Clinical Social Worker Norman Clay, MSW 06/02/2015 1:43 PM  Other: Lucinda Dell, Beverly Sessions Liasion 06/02/2015 1:43 PM  Clinical:  Hedy Jacob, RN 06/02/2015 1:43 PM  Other: , RN Charge Nurse 06/02/2015 1:43 PM  Other:     Peri Maris, [place] MSW

## 2015-06-02 NOTE — Progress Notes (Signed)
D: Patient in the hallway on approach.  Patient states his day was ok.  Patient appears to have blunted affect and a depressed mood.  Patient states he does not know what he is going to do when discharged but [facility]  Patient states he is currently homeless.  Patient states he is passive SI and verbally contracts for safety.  Patient denies HI/AVH.   A: Staff to monitor Q 15 mins for safety.  Encouragement and support offered.  Scheduled medications administered per orders.  Nicotine gum administered prn for withdrawals.   R: Patient remains safe on the unit.  Patient attended group tonight. Patient visible on the unit and interacting with peers.  Patient taking administered medications.

## 2015-06-02 NOTE — [facility] Group Notes (Signed)
[facility] Mental Health Association Group Therapy 06/02/2015 1:15pm  Type of Therapy: Mental Health Association Presentation  Participation Level: Active  Participation Quality: Attentive  Affect: Appropriate  Cognitive: Oriented  Insight: Developing/Improving  Engagement in Therapy: Engaged  Modes of Intervention: Discussion, Education and Socialization  Summary of Progress/Problems: Mental Health Association (MHA) Speaker came to talk about his personal journey with substance abuse and addiction. The pt processed ways by which to relate to the speaker. MHA speaker provided handouts and educational information pertaining to groups and services offered by the MHA. Pt was engaged in speaker's presentation and was receptive to resources provided.    Josie Burleigh Carter, LCSWA 06/02/2015 1:57 PM  

## 2015-06-02 NOTE — Progress Notes (Signed)
DAR Note: Patient presents with sad affect and depressed mood.  Reports medication causing drowsiness.  Refused 12 pm Ativan due to sedation.  Complaint reported to MD Cobos.  Patient reports mild anxiety for symptoms of withdrawal.  Denies pain, auditory and visual hallucinations.  Maintained on routine safety checks per protocol.  Support and encouragement offered as needed.
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
## 2015-06-02 NOTE — Progress Notes (Signed)
Pt reports feeling groggy this morning. "I feel like I'm still asleep".

## 2015-06-02 NOTE — [facility] Group Notes (Signed)
[facility] Group Notes:  (Nursing/MHT/Case Management/Adjunct)  Date:  06/02/2015  Time:  1000 Type of Therapy:  Nurse Education  Participation Level:  Did Not Attend  Summary of Progress/Problems: Patient did not attend. Mickie Baillizabeth O Iwenekha 06/02/2015, 11:12 AM

## 2015-06-02 NOTE — Progress Notes (Signed)
Nutrition Education Note  Pt attended group focusing on general, healthful nutrition education.  RD emphasized the importance of eating regular meals and snacks throughout the day. Consuming sugar-free beverages and incorporating fruits and vegetables into diet when possible. Provided examples of healthy snacks. Patient encouraged to leave group with a goal to improve nutrition/healthy eating.   Diet Order: Diet regular Room service appropriate?: Yes; Fluid consistency:: Thin Pt is also offered choice of unit snacks mid-morning and mid-afternoon.  Pt is eating as desired.   If additional nutrition issues arise, please consult RD.  Niko Penson, MS, RD, LDN Pager: [phone] After Hours Pager: [phone]     

## 2015-06-03 LAB — HEMOGLOBIN A1C
HEMOGLOBIN A1C: 5.8 % — AB (ref 4.8–5.6)
MEAN PLASMA GLUCOSE: 120 mg/dL

## 2015-06-03 MED ORDER — DIVALPROEX SODIUM ER 500 MG PO TB24
1000.0000 mg | ORAL_TABLET | Freq: Every day | ORAL | Status: DC
  Administered 2015-06-03 – 2015-06-06 (×4): 1000 mg via ORAL
  Filled 2015-06-03 (×6): qty 2

## 2015-06-03 MED ORDER — ZIPRASIDONE HCL 60 MG PO CAPS
60.0000 mg | ORAL_CAPSULE | Freq: Every day | ORAL | Status: DC
  Administered 2015-06-03 – 2015-06-04 (×2): 60 mg via ORAL
  Filled 2015-06-03 (×5): qty 1

## 2015-06-03 NOTE — [facility] Group Notes (Signed)
   [name]  06/03/2015  8:45 AM   Participation Quality: Alert, Appropriate and Oriented  Mood/Affect: Depressed and Flat  Depression Rating: 9  Anxiety Rating: 8  Thoughts of Suicide: Pt endorses SI but contracts  Will you contract for safety? Yes  Current AVH: Pt denies  Plan for Discharge/Comments: Pt attended discharge planning group and actively participated in group. CSW provided pt with today's workbook. [facility]   Transportation Means: Pt denies access to transportation  Supports: No supports mentioned at this time  Samuella BruinKristin Reneka Nebergall, MSW, [employer]CSWA Clinical Social Worker [facility] [phone]

## 2015-06-03 NOTE — Progress Notes (Signed)
DAR Note: Patient presents with sad affect and depressed mood.  Rates depression at 8/10.  Reports having thoughts of worthlessness and self harm thought to open up old scar.  Patient contracts for safety not to harm self.  Complain of right knee pain but refused pain management and medication when offered.  Received prescribed medication.  Maintained on routine safety checks.  Offered support and encouragement as needed.

## 2015-06-03 NOTE — [facility] Group Notes (Signed)
[facility] [name] Group Therapy 06/03/2015 1:15 PM Type of Therapy: Group Therapy Participation Level: Minimal  Participation Quality: Limited  Affect: Depressed and Flat  Cognitive: Alert and Oriented  Insight: Developing/Improving and Engaged  Engagement in Therapy: Developing/Improving and Engaged  Modes of Intervention: Clarification, Confrontation, Discussion, Education, Exploration, Limit-setting, Orientation, Problem-solving, Rapport Building, Dance movement psychotherapisteality Testing, Socialization and Support  Summary of Progress/Problems: The topic for today was feelings about relapse. Pt discussed what relapse prevention is to them and identified triggers that they are on the path to relapse. Pt processed their feeling towards relapse and was able to relate to peers. Pt discussed coping skills that can be used for relapse prevention. Patient participated minimally in discussion. He discussed his relapse behavior as eating "sweets". Patient was observed sleeping through part of group.    Samuella BruinKristin Ashden Sonnenberg, MSW, [employer]CSWA Clinical Social Worker [facility] [phone]

## 2015-06-03 NOTE — Progress Notes (Signed)
Patient ID: Ryan Bartlett, male   DOB: [birth year], 19 y.o.   MRN: [id number] D: Client visible on the unit, playful intrusive at times, but redirectable. Client reports depression "9" of 10, hadn't talk to my mom or brother in over a month" "I hate to call her, I've worried her enough, her and my grandfather always getting me out of trouble" "I'm homeless maybe that's the way it's meant to be" "woke up this morning feeling suicidal, like I wanted open up this cut on my finger, but I didn't" "the groups not helpful, I know what its going to be like, I been to them before" "nothing helps but fresh air, I like going outside playing basket ball" "I'm going to try go back get my GED" "I hate the job I got now" "[facility] [employer] [facility] [employer]" Client currently contracts for safety. A: Writer provided emotional support, encouraged client to call let mom know where he was. Reviewed medications, administered as ordered. Client encouraged to report any side effects. Staff will monitor q2815min for safety. R: Client is safe on the unit, attended group.

## 2015-06-03 NOTE — Progress Notes (Signed)
Patient ID: Ryan Bartlett, male   DOB: 1995/09/30, 19 y.o.   MRN: [id number] [facility] MD Progress Note  06/03/2015 4:10 PM Ryan Bartlett  MRN:  [id number] Subjective:  Patient reports feeling " kind of depressed today- I really do not know why". Denies medication side effects. Has reviewed history of mood swings , which are usually of short duration, but emphasizes depression as most common mood disturbance. Also reports having " an anger problem", and " loosing it very easily when I get angry , sometimes blacking out when I am really angry". He denies medication side effects and does feel medications are helping .   Objective : I have discussed case with treatment team and have met with patient. Behavior on unit in good control- - no angry or agitated behaviors . As noted, today describes increased subjective sense of depression, but denies any SI. He is also denying any psychotic symptoms and does not appear internally preoccupied . Thus far tolerating Depakote ER trial well- no side effects. No tremors . He is not presenting with any alcohol WDL symptoms at this time.  He is visible on unit , going to groups . Patient is better able to identify coping skills he can utilize to deal with his depression , anger . States he finds playing with his dogs very relaxing and that they lift his mood up. He also enjoys skate boarding. He is future oriented, and spoke about his hopes of becoming a Animal nutritionist one day.  Principal Problem: Bipolar disorder with depression ([place]) Diagnosis:   Patient Active Problem List   Diagnosis Date Noted  . Bipolar disorder with depression ([place]) [F31.30] 05/31/2015  . Bipolar 1 disorder ([place]) [F31.9] 08/19/2013  . Bipolar I disorder, most recent episode (or current) depressed, severe, without mention of psychotic behavior [F31.4] 05/23/2013  . Cannabis abuse [F12.10] 03/30/2012  . ADHD (attention deficit hyperactivity disorder), combined type [F90.2] 08/28/2011  .  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 ADHD (attention deficit hyperactivity disorder)   . Unspecified episodic mood disorder   . Oppositional defiant disorder   . Bipolar disorder ([place])   . Depression     Past Surgical History  Procedure Laterality Date  . No past surgeries     Family History:  Family History  Problem Relation Age of Onset  . Depression Mother   . Diabetes Other   . Hyperlipidemia Other   . Hypertension Other     Social History:  History  Alcohol Use  . 3.6 oz/week  . 6 Cans of beer per week    Comment: rarely     History  Drug Use No    Comment: pt reports a hx of THC use    Social History   Social History  . Marital Status: Single    Spouse Name: N/A  . Number of Children: N/A  . Years of Education: N/A   Occupational History  . student     12th grade at [employer] Topics  . Smoking status: Current Every Day Smoker -- 0.50 packs/day for 5 years    Types: Cigarettes  . Smokeless tobacco: Never Used  . Alcohol Use: 3.6 oz/week    6 Cans of beer per week     Comment: rarely  . Drug Use: No     Comment:  pt reports a hx of THC use  . Sexual Activity:    Partners: Female    Museum/gallery curator: Condom     Comment: Pt reports that he is not sexually active   Other Topics Concern  . None   Social History Narrative   Additional Social History:   Sleep: Good  Appetite:  Good  Current Medications: Current Facility-Administered Medications  Medication Dose Route Frequency Provider Last Rate Last Dose  . acetaminophen (TYLENOL) tablet 650 mg  650 mg Oral Q6H PRN Delfin Gant, NP   650 mg at 06/03/15 1512  . alum & mag hydroxide-simeth (MAALOX/MYLANTA) 200-200-20 MG/5ML suspension 30 mL  30 mL Oral Q4H PRN Delfin Gant, NP      . divalproex (DEPAKOTE ER) 24 hr  tablet 750 mg  750 mg Oral QHS Myer Peer , MD   750 mg at 06/02/15 2300  . hydrOXYzine (ATARAX/VISTARIL) tablet 25 mg  25 mg Oral Q6H PRN Jenne Campus, MD      . loperamide (IMODIUM) capsule 2-4 mg  2-4 mg Oral PRN Jenne Campus, MD      . LORazepam (ATIVAN) tablet 1 mg  1 mg Oral Q6H PRN Myer Peer , MD      . magnesium hydroxide (MILK OF MAGNESIA) suspension 30 mL  30 mL Oral Daily PRN Delfin Gant, NP      . multivitamin with minerals tablet 1 tablet  1 tablet Oral Daily Jenne Campus, MD   1 tablet at 06/03/15 0759  . nicotine polacrilex (NICORETTE) gum 2 mg  2 mg Oral PRN Jenne Campus, MD   2 mg at 06/03/15 1417  . ondansetron (ZOFRAN-ODT) disintegrating tablet 4 mg  4 mg Oral Q6H PRN Jenne Campus, MD      . thiamine (VITAMIN B-1) tablet 100 mg  100 mg Oral Daily Jenne Campus, MD   100 mg at 06/03/15 0759  . traZODone (DESYREL) tablet 50 mg  50 mg Oral QHS Jenne Campus, MD   50 mg at 06/02/15 2301  . ziprasidone (GEODON) capsule 20 mg  20 mg Oral BID WC Jenne Campus, MD   20 mg at 06/03/15 6060    Lab Results:  Results for orders placed or performed during the hospital encounter of 05/31/15 (from the past 48 hour(s))  Urine rapid drug screen (hosp performed)     Status: None   Collection Time: 06/01/15  6:10 PM  Result Value Ref Range   Opiates NONE DETECTED NONE DETECTED   Cocaine NONE DETECTED NONE DETECTED   Benzodiazepines NONE DETECTED NONE DETECTED   Amphetamines NONE DETECTED NONE DETECTED   Tetrahydrocannabinol NONE DETECTED NONE DETECTED   Barbiturates NONE DETECTED NONE DETECTED    Comment:        DRUG SCREEN FOR MEDICAL PURPOSES ONLY.  IF CONFIRMATION IS NEEDED FOR ANY PURPOSE, NOTIFY LAB WITHIN 5 DAYS.        LOWEST DETECTABLE LIMITS FOR URINE DRUG SCREEN Drug Class       Cutoff (ng/mL) Amphetamine      1000 Barbiturate      200 Benzodiazepine   045 Tricyclics       997 Opiates          300 Cocaine          300 THC               50 Performed at [facility]  panel     Status: Abnormal   Collection Time: 06/02/15  6:35 AM  Result Value Ref Range   Cholesterol 199 0 - 200 mg/dL   Triglycerides 390 (H) <150 mg/dL   HDL 31 (L) >40 mg/dL   Total CHOL/HDL Ratio 6.4 RATIO   VLDL 78 (H) 0 - 40 mg/dL   LDL Cholesterol 90 0 - 99 mg/dL    Comment:        Total Cholesterol/HDL:CHD Risk Coronary Heart Disease Risk Table                     Men   Women  1/2 Average Risk   3.4   3.3  Average Risk       5.0   4.4  2 X Average Risk   9.6   7.1  3 X Average Risk  23.4   11.0        Use the calculated Patient Ratio above and the CHD Risk Table to determine the patient's CHD Risk.        ATP III CLASSIFICATION (LDL):  <100     mg/dL   Optimal  100-129  mg/dL   Near or Above                    Optimal  130-159  mg/dL   Borderline  160-189  mg/dL   High  >190     mg/dL   Very High Performed at [facility]   Hemoglobin A1c     Status: Abnormal   Collection Time: 06/02/15  6:35 AM  Result Value Ref Range   Hgb A1c MFr Bld 5.8 (H) 4.8 - 5.6 %    Comment: (NOTE)         Pre-diabetes: 5.7 - 6.4         Diabetes: >6.4         Glycemic control for adults with diabetes: <7.0    Mean Plasma Glucose 120 mg/dL    Comment: (NOTE) Performed At: [facility] [place], [place] [id number] Lindon Romp MD IR:[id number] Performed at [facility]   TSH     Status: None   Collection Time: 06/02/15  6:35 AM  Result Value Ref Range   TSH 3.181 0.350 - 4.500 uIU/mL    Comment: Performed at [facility]  Valproic acid level     Status: Abnormal   Collection Time: 06/02/15  6:35 AM  Result Value Ref Range   Valproic Acid Lvl 30 (L) 50.0 - 100.0 ug/mL    Comment: Performed at [facility]  Hepatic function panel     Status: Abnormal   Collection Time: 06/02/15  6:35 AM  Result Value Ref Range   Total  Protein 7.6 6.5 - 8.1 g/dL   Albumin 4.2 3.5 - 5.0 g/dL   AST 16 15 - 41 U/L   ALT 15 (L) 17 - 63 U/L   Alkaline Phosphatase 61 38 - 126 U/L   Total Bilirubin 0.4 0.3 - 1.2 mg/dL   Bilirubin, Direct <0.1 (L) 0.1 - 0.5 mg/dL   Indirect Bilirubin NOT CALCULATED 0.3 - 0.9 mg/dL    Comment: Performed at [facility]    Physical Findings: AIMS:  , ,  ,  ,    CIWA:  CIWA-Ar Total: 0 COWS:     Musculoskeletal: Strength & Muscle Tone: within normal limits- no tremors, no diaphoresis, no psychomotor  agitation or restlessness  Gait & Station: normal Patient leans: N/A  Psychiatric Specialty Exam: ROS- denies headache, denies chest pain, denies shortness of breath, no vomiting, denies dysuria, denies polyuria   Blood pressure 142/91, pulse 75, temperature 97.9 F (36.6 C), temperature source Oral, resp. rate 18, height 5' 9" (1.753 m), weight 232 lb (105.235 kg).Body mass index is 34.24 kg/(m^2).  General Appearance:  Improved grooming   Eye Contact::  Good  Speech:  Normal Rate  Volume:  Normal  Mood:  Still feels depressed   Affect:   Constricted but reactive,  Not irritable today  Thought Process:  Linear  Orientation:  Full (Time, Place, and Person)  Thought Content:  denies hallucinations, no delusions, not internally preoccupied   Suicidal Thoughts:  No at this time denies any self injurious or suicidal ideations and contracts for safety on the unit.  Homicidal Thoughts:  No at this time denies any thoughts of hurting anyone   Memory:  recent and remote grossly intact   Judgement:   improving  Insight:   improving   Psychomotor Activity:  Normal  Concentration:  Good  Recall:  Good  Fund of Knowledge:Good  Language: Good  Akathisia:  Negative  Handed:  Right  AIMS (if indicated):     Assets:  Desire for Improvement Physical Health Resilience  ADL's:  Intact  Cognition: WNL  Sleep:  Number of Hours: 5.75  Assessment -  Reports increased depression  today and does appear somewhat constricted in affect, sad, but still future oriented,  And denies any SI. There are no psychotic symptoms. In addition to depression and short lived but intense mood swings, instability, also reports history of angry outbursts . Thus far tolerating Geodon and Depakote ER trials well, and has had no episodes of anger outbursts on unit .  Treatment Plan Summary: Daily contact with patient to assess and evaluate symptoms and progress in treatment, Medication management, Plan inpatient admission and medications as below  Increase  Depakote  ER to 1022mrs QHS for management of mood disorder, explosive anger  D/C standing Ativan taper to minimize sedation risk Continue Ativan PRNs  For possible alcohol WDL as per CIWA score, if needed Continue Trazodone 50 mgrs QHS PRN for insomnia as needed Increase Geodon to 60 mgrs QHS for management of mood disorder  Recheck EKG to monitor QTc as we increase Geodon dosing  Continue to encourage group, milieu participation to work on coping skills and symptom reduction Patient expressing interest in disposition options outside [place] an alternative for him. Will review with team/ CSW .  ,  06/03/2015, 4:10 PM

## 2015-06-04 MED ORDER — PNEUMOCOCCAL VAC POLYVALENT 25 MCG/0.5ML IJ INJ
0.5000 mL | INJECTION | INTRAMUSCULAR | Status: AC
  Administered 2015-06-06: 0.5 mL via INTRAMUSCULAR

## 2015-06-04 NOTE — [facility] Group Notes (Signed)
[facility] Group Notes: (Clinical Social Work)  06/04/2015 1:15-2:15PM  Summary of Progress/Problems: The main focus of today's process group was to discuss the differences between unhealthy coping techniques and healthy coping techniques. The group discussed the difficulty of learning new ways of dealing with hard situations, when they have been accustomed to using the unhealthy coping for so long. The patient shared healthy coping techniques he needs to learn include how to not drink.  He was involved in side conversations, and endorsed another patient's anger, leaving the group early in anger.  Type of Therapy: Group Therapy - Process   Participation Level: Active  Participation Quality:  Resistant  Affect:  Irritable  Cognitive:  Oriented  Insight:  Poor  Engagement in Therapy:  Poor  Modes of Intervention:  Education, Motivational Interviewing  Ryan MantleMareida Grossman-Orr, [name] 06/04/2015, 4:00 PM

## 2015-06-04 NOTE — Plan of Care (Addendum)
Problem: Alteration in mood Goal: STG-Patient reports thoughts of self-harm to staff Outcome: Progressing Client reports thoughts of reopening an old cut on this finger, but contracts for safety. "I wouldn't do that"

## 2015-06-04 NOTE — Plan of Care (Signed)
Problem: Alteration in mood & ability to function due to Goal: STG-Patient will attend groups Outcome: Progressing Pt attended evening group on 06/04/15.

## 2015-06-04 NOTE — Progress Notes (Signed)
DAR Note: Patient presents with sad mood and affect.  Reports good night sleep.  Rates depression at 8/10, hopelessness at 9/10, and anxiety at 10/10.  Reports having self harm thoughts but contracts for safety.  Complain of right knee pain.  Tylenol 650 mg given with good effect.  Received prescribed medications.  Maintained on routine safety checks.  Patient attended group and participated.  Support and encouragement offered as needed.

## 2015-06-04 NOTE — Progress Notes (Signed)
D: Pt is in hall upon initial assessment.  He was joking with his peers at the nurse's station.  He reports his mood is depressed.   He reports his goal is to "work on my anger."  Pt reports passive SI without a plan.  He verbally contracts for safety.  Pt denies HI, denies hallucinations, reports right knee pain of 10/10.  Pt has been visible in milieu interacting with peers and staff.  He is intrusive at times.  Pt attended evening group.   A: Introduced self to pt.  Met with pt 1:1 and provided support and encouragement.  Medications administered per order.  PRN medication administered for pain.  Heat pack provided for pain per request.   R: Pt is compliant with medications.  Pt verbally contracts for safety.  Will continue to monitor and assess.

## 2015-06-04 NOTE — Plan of Care (Signed)
Problem: Alteration in mood Goal: LTG-Patient reports reduction in suicidal thoughts (Patient reports reduction in suicidal thoughts and is able to verbalize a safety plan for whenever patient is feeling suicidal)  Outcome: Progressing Patient reports reduction in suicidal thoughts.

## 2015-06-04 NOTE — Progress Notes (Signed)
Patient ID: Ryan Bartlett, male   DOB: 26-Dec-1995, 19 y.o.   [id number] [facility]  06/04/2015 4:55 PM Ryan Bartlett  MRN:  [id number] Subjective:  Patient reports "I'm irritated today. I keep having these suicidal thoughts that just pop into my head for no reason. I don't even know why".   Objective: Pt seen and chart reviewed. Pt is alert/oriented x4, calm, cooperative, and appropriate to situation. Pt denies homicidal ideation at this time. Affirms suicidal ideation. Denies psychosis and does not appear to be responding to internal stimuli. Pt reports that he is trying to participate in groups but that he feels hopeless overall.   Principal Problem: Bipolar disorder with depression ([facility]) Diagnosis:   Patient Active Problem List   Diagnosis Date Noted  . Bipolar disorder with depression ([facility]) [F31.30] 05/31/2015  . Bipolar 1 disorder ([facility]) [F31.9] 08/19/2013  . Bipolar I disorder, most recent episode (or current) depressed, severe, without mention of psychotic behavior [F31.4] 05/23/2013  . Cannabis abuse [F12.10] 03/30/2012  . ADHD (attention deficit hyperactivity disorder), combined type [F90.2] 08/28/2011  . 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 ADHD (attention deficit hyperactivity disorder)   . Unspecified episodic mood disorder   . Oppositional defiant disorder   . Bipolar disorder ([facility])   . Depression     Past Surgical History  Procedure Laterality Date  . No past surgeries     Family History:  Family History  Problem Relation Age of Onset  . Depression Mother   . Diabetes Other   . Hyperlipidemia Other   . Hypertension Other     Social History:  History  Alcohol Use  . 3.6 oz/week  . 6 Cans of beer per week    Comment: rarely     History  Drug Use No    Comment: pt reports a hx of THC use    Social History    Social History  . Marital Status: Single    Spouse Name: N/A  . Number of Children: N/A  . Years of Education: N/A   Occupational History  . student     12th grade at [employer]   Social History Main Topics  . Smoking status: Current Every Day Smoker -- 0.50 packs/day for 5 years    Types: Cigarettes  . Smokeless tobacco: Never Used  . Alcohol Use: 3.6 oz/week    6 Cans of beer per week     Comment: rarely  . Drug Use: No     Comment: pt reports a hx of THC use  . Sexual Activity:    Partners: Female    CopyBirth Control/ Protection: Condom     Comment: Pt reports that he is not sexually active   Other Topics Concern  . None   Social History Narrative   Additional Social History:   Sleep: Good  Appetite:  Good  Current Medications: Current Facility-Administered Medications  Medication Dose Route Frequency Provider Last Rate Last Dose  . acetaminophen (TYLENOL) tablet 650 mg  650 mg Oral Q6H PRN Earney NavyJosephine C Onuoha, NP   650 mg at 06/04/15 1317  . alum & mag hydroxide-simeth (MAALOX/MYLANTA) 200-200-20 MG/5ML suspension 30 mL  30 mL Oral Q4H PRN Earney NavyJosephine C Onuoha, NP      . divalproex (DEPAKOTE ER) 24 hr tablet 1,000 mg  1,000 mg Oral QHS Madaline GuthrieFernando  A Cobos, MD   1,000 mg at 06/03/15 2248  . magnesium hydroxide (MILK OF MAGNESIA) suspension 30 mL  30 mL Oral Daily PRN Earney Navy, NP   30 mL at 06/03/15 2122  . multivitamin with minerals tablet 1 tablet  1 tablet Oral Daily Craige Cotta, MD   1 tablet at 06/04/15 [phone]  . nicotine polacrilex (NICORETTE) gum 2 mg  2 mg Oral PRN Craige Cotta, MD   2 mg at 06/04/15 0747  . thiamine (VITAMIN B-1) tablet 100 mg  100 mg Oral Daily Craige Cotta, MD   100 mg at 06/04/15 0742  . traZODone (DESYREL) tablet 50 mg  50 mg Oral QHS Craige Cotta, MD   50 mg at 06/03/15 2248  . ziprasidone (GEODON) capsule 60 mg  60 mg Oral QHS Craige Cotta, MD   60 mg at 06/03/15 2248    Lab Results:  No results found for this  or any previous visit (from the past 48 hour(s)).  Physical Findings: AIMS:  , ,  ,  ,    CIWA:  CIWA-Ar Total: 0 COWS:     Musculoskeletal: Strength & Muscle Tone: within normal limits- no tremors, no diaphoresis, no psychomotor agitation or restlessness  Gait & Station: normal Patient leans: N/A  Psychiatric Specialty Exam: Review of Systems  Psychiatric/Behavioral: Positive for depression and suicidal ideas. Negative for hallucinations and substance abuse. The patient is nervous/anxious and has insomnia.   All other systems reviewed and are negative. - denies headache, denies chest pain, denies shortness of breath, no vomiting, denies dysuria, denies polyuria   Blood pressure 125/80, pulse 75, temperature 97.7 F (36.5 C), temperature source Oral, resp. rate 16, height  (1.753 m), weight 105.235 kg (232 lb).Body mass index is 34.24 kg/(m^2).  General Appearance:  Improved grooming   Eye Contact::  Good  Speech:  Normal Rate  Volume:  Normal  Mood:  Still feels depressed   Affect:   Constricted but reactive,  Not irritable today  Thought Process:  Linear  Orientation:  Full (Time, Place, and Person)  Thought Content:  denies hallucinations, no delusions, not internally preoccupied   Suicidal Thoughts:  No at this time denies any self injurious or suicidal ideations and contracts for safety on the unit.  Homicidal Thoughts:  No at this time denies any thoughts of hurting anyone   Memory:  recent and remote grossly intact   Judgement:   improving  Insight:   improving   Psychomotor Activity:  Normal  Concentration:  Good  Recall:  Good  Fund of Knowledge:Good  Language: Good  Akathisia:  Negative  Handed:  Right  AIMS (if indicated):     Assets:  Desire for Improvement Physical Health Resilience  ADL's:  Intact  Cognition: WNL  Sleep:  Number of Hours: 6.5   *I have reviewed and concur with treatment plan on 06/04/15 and continue as follows:   Treatment Plan  Summary: Daily contact with patient to assess and evaluate symptoms and progress in treatment, Medication management, Plan inpatient admission and medications as below  continue  Depakote  ER to rs QHS for management of mood disorder, explosive anger  Continue Ativan PRNs  For possible alcohol WDL as per CIWA score, if needed Continue Trazodone 50 mgrs QHS PRN for insomnia as needed continue Geodon to 60 mgrs QHS for management of mood disorder  EKG reviewed; WNL Qtc low 400's Continue to encourage group, milieu participation to  work on Pharmacologist and symptom reduction Patient expressing interest in disposition options outside [place] area- [employer] may be an alternative for him. Will review with team/ CSW .  Beau Fanny, FNP-BC 06/04/2015, 4:55 PM I agree with assessment and plan Madie Reno A. Dub Mikes, M.D.

## 2015-06-05 MED ORDER — OLANZAPINE 10 MG PO TBDP
10.0000 mg | ORAL_TABLET | Freq: Once | ORAL | Status: AC
  Administered 2015-06-05: 10 mg via ORAL
  Filled 2015-06-05: qty 1

## 2015-06-05 MED ORDER — ZIPRASIDONE HCL 60 MG PO CAPS
60.0000 mg | ORAL_CAPSULE | Freq: Every day | ORAL | Status: DC
  Filled 2015-06-05: qty 1

## 2015-06-05 MED ORDER — MELOXICAM 15 MG PO TABS
15.0000 mg | ORAL_TABLET | Freq: Every day | ORAL | Status: DC
  Administered 2015-06-05 – 2015-06-07 (×3): 15 mg via ORAL
  Filled 2015-06-05: qty 1
  Filled 2015-06-05: qty 2
  Filled 2015-06-05 (×3): qty 1
  Filled 2015-06-05: qty 2
  Filled 2015-06-05: qty 1

## 2015-06-05 MED ORDER — ALBUTEROL SULFATE HFA 108 (90 BASE) MCG/ACT IN AERS
1.0000 | INHALATION_SPRAY | RESPIRATORY_TRACT | Status: DC | PRN
  Administered 2015-06-05: 2 via RESPIRATORY_TRACT
  Filled 2015-06-05: qty 6.7

## 2015-06-05 MED ORDER — NICOTINE POLACRILEX 2 MG MT GUM
CHEWING_GUM | OROMUCOSAL | Status: AC
  Administered 2015-06-05: 2 mg via ORAL
  Filled 2015-06-05: qty 1

## 2015-06-05 MED ORDER — OLANZAPINE 10 MG PO TBDP
ORAL_TABLET | ORAL | Status: AC
  Filled 2015-06-05: qty 1

## 2015-06-05 NOTE — [facility] Group Notes (Signed)
[facility] Group Notes: (Clinical Social Work)  06/05/2015 1:15-2:15PM  Summary of Progress/Problems: The main focus of today's process group was to  1) discuss the importance of adding supports 2) identify the patient's current healthy supports  3) do an exercise to increase connection with and therefore empathy for others  An emphasis was placed on using problem-specific support groups to expand supports.   The patient expressed that his little brother and his dog are his sole supports.  He was in and out of the group room and did not talk any further.  Type of Therapy: Process Group with Motivational Interviewing  Participation Level: Minimal  Participation Quality:Limited  Affect: Blunted  Cognitive:Not assessed  Insight: Limited  Engagement in Therapy: Limited  Modes of Intervention: Education, Support and Processing, Activity  Ambrose MantleMareida Grossman-Orr, [name] 06/05/2015

## 2015-06-05 NOTE — Progress Notes (Signed)
Patient ID: Ryan Bartlett, male   DOB: 12-29-1995, 19 y.o.   MRN: [id number] [facility] MD Progress Note  06/05/2015 6:09 PM Ryan Bartlett  MRN:  [id number] Subjective:  Patient states: "I feel a lot better today. Definitely, but still the suicidal thoughts just hit me for no reason. I want them to stop".   Objective: Pt seen and chart reviewed. Pt is alert/oriented x4, calm, cooperative, and appropriate to situation. Pt denies homicidal ideation at this time. Affirms suicidal ideation. Denies psychosis and does not appear to be responding to internal stimuli. Pt reports that he is more optimistic today about his treatment and that he enjoyed going outside and felt it was beneficial for his mood.   Principal Problem: Bipolar disorder with depression (HCC) Diagnosis:   Patient Active Problem List   Diagnosis Date Noted  . Bipolar disorder with depression (HCC) [F31.30] 05/31/2015  . Bipolar 1 disorder (HCC) [F31.9] 08/19/2013  . Bipolar I disorder, most recent episode (or current) depressed, severe, without mention of psychotic behavior [F31.4] 05/23/2013  . Cannabis abuse [F12.10] 03/30/2012  . ADHD (attention deficit hyperactivity disorder), combined type [F90.2] 08/28/2011  . 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 ADHD (attention deficit hyperactivity disorder)   . Unspecified episodic mood disorder   . Oppositional defiant disorder   . Bipolar disorder (HCC)   . Depression     Past Surgical History  Procedure Laterality Date  . No past surgeries     Family History:  Family History  Problem Relation Age of Onset  . Depression Mother   . Diabetes Other   . Hyperlipidemia Other   . Hypertension Other     Social History:  History  Alcohol Use  . 3.6 oz/week  . 6 Cans of beer per week    Comment: rarely     History  Drug Use No    Comment: pt  reports a hx of THC use    Social History   Social History  . Marital Status: Single    Spouse Name: N/A  . Number of Children: N/A  . Years of Education: N/A   Occupational History  . student     12th grade at [employer]   Social History Main Topics  . Smoking status: Current Every Day Smoker -- 0.50 packs/day for 5 years    Types: Cigarettes  . Smokeless tobacco: Never Used  . Alcohol Use: 3.6 oz/week    6 Cans of beer per week     Comment: rarely  . Drug Use: No     Comment: pt reports a hx of THC use  . Sexual Activity:    Partners: Female    Copy: Condom     Comment: Pt reports that he is not sexually active   Other Topics Concern  . None   Social History Narrative   Additional Social History:   Sleep: Good  Appetite:  Good  Current Medications: Current Facility-Administered Medications  Medication Dose Route Frequency Provider Last Rate Last Dose  . acetaminophen (TYLENOL) tablet 650 mg  650 mg Oral Q6H PRN Earney Navy, NP   650 mg at 06/05/15 0817  . albuterol (PROVENTIL HFA;VENTOLIN HFA) 108 (90 BASE) MCG/ACT inhaler 1-2 puff  1-2 puff Inhalation Q4H PRN Beau Fanny, FNP   2 puff at 06/05/15 1713  . alum &  mag hydroxide-simeth (MAALOX/MYLANTA) 200-200-20 MG/5ML suspension 30 mL  30 mL Oral Q4H PRN Earney NavyJosephine C Onuoha, NP      . divalproex (DEPAKOTE ER) 24 hr tablet 1,000 mg  1,000 mg Oral QHS Craige CottaFernando A Cobos, MD   1,000 mg at 06/04/15 2113  . magnesium hydroxide (MILK OF MAGNESIA) suspension 30 mL  30 mL Oral Daily PRN Earney NavyJosephine C Onuoha, NP   30 mL at 06/03/15 2122  . meloxicam (MOBIC) tablet 15 mg  15 mg Oral Daily Beau FannyJohn C Withrow, FNP   15 mg at 06/05/15 1713  . multivitamin with minerals tablet 1 tablet  1 tablet Oral Daily Craige CottaFernando A Cobos, MD   1 tablet at 06/05/15 0817  . nicotine polacrilex (NICORETTE) gum 2 mg  2 mg Oral PRN Craige CottaFernando A Cobos, MD   2 mg at 06/05/15 1506  . pneumococcal 23 valent vaccine (PNU-IMMUNE)  injection 0.5 mL  0.5 mL Intramuscular Tomorrow-1000 Fernando A Cobos, MD      . thiamine (VITAMIN B-1) tablet 100 mg  100 mg Oral Daily Craige CottaFernando A Cobos, MD   100 mg at 06/05/15 0817  . traZODone (DESYREL) tablet 50 mg  50 mg Oral QHS Craige CottaFernando A Cobos, MD   50 mg at 06/04/15 2113  . [START ON 06/06/2015] ziprasidone (GEODON) capsule 60 mg  60 mg Oral Daily Beau FannyJohn C Withrow, FNP        Lab Results:  No results found for this or any previous visit (from the past 48 hour(s)).  Physical Findings: AIMS:  , ,  ,  ,    CIWA:  CIWA-Ar Total: 0 COWS:     Musculoskeletal: Strength & Muscle Tone: within normal limits- no tremors, no diaphoresis, no psychomotor agitation or restlessness  Gait & Station: normal Patient leans: N/A  Psychiatric Specialty Exam: Review of Systems  Psychiatric/Behavioral: Positive for depression and suicidal ideas. Negative for hallucinations and substance abuse. The patient is nervous/anxious and has insomnia.   All other systems reviewed and are negative. - denies headache, denies chest pain, denies shortness of breath, no vomiting, denies dysuria, denies polyuria   Blood pressure 113/66, pulse 64, temperature 97.8 F (36.6 C), temperature source Oral, resp. rate 18, height 5\' 9"  (1.753 m), weight 105.235 kg (232 lb).Body mass index is 34.24 kg/(m^2).  General Appearance:  Improved grooming   Eye Contact::  Good  Speech:  Normal Rate  Volume:  Normal  Mood:  Still feels depressed   Affect:   Constricted but reactive,  Not irritable today  Thought Process:  Linear  Orientation:  Full (Time, Place, and Person)  Thought Content:  denies hallucinations, no delusions, not internally preoccupied   Suicidal Thoughts:  No at this time denies any self injurious or suicidal ideations and contracts for safety on the unit.  Homicidal Thoughts:  No at this time denies any thoughts of hurting anyone   Memory:  recent and remote grossly intact   Judgement:   improving   Insight:   improving   Psychomotor Activity:  Normal  Concentration:  Good  Recall:  Good  Fund of Knowledge:Good  Language: Good  Akathisia:  Negative  Handed:  Right  AIMS (if indicated):     Assets:  Desire for Improvement Physical Health Resilience  ADL's:  Intact  Cognition: WNL  Sleep:  Number of Hours: 6.25   *I have reviewed and concur with treatment plan on 06/05/15 and continue as follows:   Treatment Plan Summary: Daily contact with patient  to assess and evaluate symptoms and progress in treatment, Medication management, Plan inpatient admission and medications as below  continue  Depakote  ER to rs QHS for management of mood disorder, explosive anger  Continue Ativan PRNs  For possible alcohol WDL as per CIWA score, if needed Continue Trazodone 50 mgrs QHS PRN for insomnia as needed continue Geodon to 60 mgrs QHS for management of mood disorder  EKG reviewed; WNL Qtc low 400's Continue to encourage group, milieu participation to work on coping skills and symptom reduction Patient expressing interest in disposition options outside [place] area- [employer] may be an alternative for him. Will review with team/ CSW .  *Moved Geodon to dinner-time as it does not absorb more than 32% if not taken with at least 300 calories and >82% with 500 calories. It is nearly null absorption taken at bedtime. This may be effective this evening and we will have a better idea before we consider upward titration again.  Beau Fanny, FNP-BC 06/05/2015, 6:09 PM I agree with assessment and plan Madie Reno A. Dub Mikes, M.D.

## 2015-06-05 NOTE — Progress Notes (Signed)
[facility] Group Notes:  (Nursing/MHT/Case Management/Adjunct)  Date:  06/05/2015  Time:  1:26 AM  Type of Therapy:  Psychoeducational Skills  Participation Level:  Active  Participation Quality:  Attentive  Affect:  Appropriate  Cognitive:  Appropriate  Insight:  Appropriate  Engagement in Group:  Distracting  Modes of Intervention:  Education  Summary of Progress/Problems: The patient expressed in group that he did not have a very good day. The patient explained to the group that he felt "manic" for much of the day and that he feels this way "all the time". He also shared that he attended the groups and went outside for fresh air. The patient refused to admit that not one thing turned out to be positive and worthwhile about his day. As for the theme of the day, his coping skill is to go "skateboarding".   Darlin Stenseth S 06/05/2015, 1:26 AM

## 2015-06-05 NOTE — [facility] Group Notes (Signed)
[facility] Group Notes:  (Nursing/MHT/Case Management/Adjunct)  Date:  06/05/2015  Time: 1045  Type of Therapy:  Nurse Education  Participation Level:  Active  Participation Quality:  Appropriate, Attentive, Sharing and Supportive  Affect:  Appropriate  Cognitive:  Appropriate  Insight:  Good  Engagement in Group:  Engaged and Supportive  Modes of Intervention:  Activity, Clarification, Discussion, Education, Socialization and Support  Summary of Progress/Problems:  Dara Hoyershley N Jaicob Dia 06/05/2015, 12:18 PM

## 2015-06-05 NOTE — [facility] Group Notes (Signed)
[facility] Group Notes:  (Nursing/MHT/Case Management/Adjunct)  Date:  06/05/2015  Time:  12:06 PM  Type of Therapy:  Nurse Education  /  Developing Healthy support systems: The group is focused on teaching pts how to identify healthy support systems as well as how to develop them, if they have none.  Participation Level:  Minimal  Participation Quality:  Attentive  Affect:  Flat  Cognitive:  Alert  Insight:  Limited  Engagement in Group:  Engaged  Modes of Intervention:  Education  Summary of Progress/Problems:  Rich BraveDuke, Jerid Catherman Lynn 06/05/2015, 12:06 PM

## 2015-06-05 NOTE — Progress Notes (Signed)
Ryan Bartlett has had a good day. He completed his daily assessment  And on it he wrote he has had SI today but he quickly contracts to stay safe when this nurse approaches him about this. HE rated his depression, hopelessness and anxiety " 9/10/8" respectively.    A HE is observed, most of the day, at the nurse station....talking to the MHT secretary and MHT sitting at the desk. When this nurse redirects him, he follows redirection, but onl stays in the dayroom a short epriod of time ( / 15 mn) and then goes back to the nurses' station.....visiting with the tech's.   R He is encouraged to " work his program"....by this nurse.

## 2015-06-06 DIAGNOSIS — F1020 Alcohol dependence, uncomplicated: Secondary | ICD-10-CM | POA: Clinically undetermined

## 2015-06-06 DIAGNOSIS — R45850 Homicidal ideations: Secondary | ICD-10-CM

## 2015-06-06 DIAGNOSIS — F1220 Cannabis dependence, uncomplicated: Secondary | ICD-10-CM | POA: Clinically undetermined

## 2015-06-06 MED ORDER — ZIPRASIDONE HCL 60 MG PO CAPS
60.0000 mg | ORAL_CAPSULE | Freq: Every day | ORAL | Status: DC
  Administered 2015-06-06 – 2015-06-08 (×3): 60 mg via ORAL
  Filled 2015-06-06 (×2): qty 1
  Filled 2015-06-06: qty 3
  Filled 2015-06-06 (×2): qty 1

## 2015-06-06 MED ORDER — DIPHENHYDRAMINE HCL 50 MG PO CAPS
50.0000 mg | ORAL_CAPSULE | Freq: Once | ORAL | Status: AC
  Administered 2015-06-06: 50 mg via ORAL
  Filled 2015-06-06: qty 1
  Filled 2015-06-06: qty 2

## 2015-06-06 MED ORDER — ZIPRASIDONE HCL 20 MG PO CAPS
20.0000 mg | ORAL_CAPSULE | Freq: Every day | ORAL | Status: DC
  Administered 2015-06-07: 20 mg via ORAL
  Filled 2015-06-06 (×2): qty 1

## 2015-06-06 NOTE — Plan of Care (Signed)
Problem: Alteration in mood Goal: LTG-Patient reports reduction in suicidal thoughts (Patient reports reduction in suicidal thoughts and is able to verbalize a safety plan for whenever patient is feeling suicidal)  Outcome: Not Progressing Ryan Bartlett continues to voice suicidal thoughts.  He is able to contract for safety and staff will continue to monitor the progress towards his goals.

## 2015-06-06 NOTE — Progress Notes (Signed)
PT Cancellation Note  Patient Details Name: Ryan Bartlett MRN: [id number] DOB: 29-Sep-1995   Cancelled Treatment:    Reason Eval/Treat Not Completed:  (spoke to [employer] Heather who stated pt has been ambulating on unit without difficulty. From chart review my impression is that pt would benefit from orthopedic consult to evaluate knee pain, or outpt PT consult. RN to page me if futher needs arise. )   Tamala SerUhlenberg, Sami Roes Kistler 06/06/2015, 8:42 AM [phone]

## 2015-06-06 NOTE — [facility] Group Notes (Signed)
[facility] [name] Aftercare Discharge Planning Group Note  06/06/2015  8:45 AM  Participation Quality: Did Not Attend. Patient invited to participate but declined.  Haaris Metallo, MSW, LCSWA Clinical Social Worker [facility] [phone]    

## 2015-06-06 NOTE — Progress Notes (Signed)
Pt was visible on the unit.  He has attended some groups.  He refused his medication this morning but got up later requesting to take them.  He reports that he has suicidal thoughts but they come and go.  He is able to contract for safety.  He C/O knee pain 8/10 and standing mobic helped with the pain.  He was able to go outside and play basketball with staff.  He asked for ice pack after coming in with relief.  He is noted sleeping during the afternoon and didn't go to groups on 400 hall.  Q 15 minute checks maintained for safety.  Encouraged continued participation in group and unit activities.  No evidence of cheeking his medications.

## 2015-06-06 NOTE — [facility] Group Notes (Signed)
[facility] [name] Group Therapy 06/06/2015  1:15 pm  Type of Therapy: Group Therapy Participation Level: Active  Participation Quality: Attentive, Sharing and Supportive  Affect: Appropriate   Cognitive: Alert and Oriented  Insight: Developing/Improving and Engaged  Engagement in Therapy: Developing/Improving and Engaged  Modes of Intervention: Clarification, Confrontation, Discussion, Education, Exploration,  Limit-setting, Orientation, Problem-solving, Rapport Building, Dance movement psychotherapisteality Testing, Socialization and Support  Summary of Progress/Problems: Pt identified obstacles faced currently and processed barriers involved in overcoming these obstacles. Pt identified steps necessary for overcoming these obstacles and explored motivation (internal and external) for facing these difficulties head on. Pt further identified one area of concern in their lives and chose a goal to focus on for today. Patient identified his anger as an obstacle and discussed his need to protect himself and his family. CSW and other group members provided patient with emotional support and encouragement.  Samuella BruinKristin Lyvonne Cassell, MSW, [employer]CSWA Clinical Social Worker [facility] [phone]

## 2015-06-06 NOTE — Progress Notes (Signed)
Patient ID: Ryan Bartlett, male   DOB: Dec 24, 1995, 19 y.o.   MRN: [id number] [facility] MD Progress Note  06/06/2015 1:07 PM Ryan Bartlett  MRN:  [id number] Subjective:  Patient states: "I still feel suicidal , I have a plan to cheek all my medications and OD on them. I was also homicidal towards another lady last night.'  Objective: Pt is a 18 y old AAM , who has a hx of bipolar do as well as cannabis abuse , alcohol abuse , presented to the ED with SI with plan to OD .Pt seen and chart reviewed.  Pt today continues to be depressed, has mood lability as well as endorses SI with a plan to OD on pills . Pt reports that since he has no access to pills here, he is planning to cheek them and OD on them when he has enough. Pt continues to be angry towards another patient who upset him yesterday. Pt reports being depressed and hopeless. Per staff - pt with irritability , mood lability , was transferred from 400 H to 500 H due to high acuity ( behavioral issues) while on the other unit.    Principal Problem: Bipolar I disorder, most recent episode depressed (HCC) Diagnosis:   Patient Active Problem List   Diagnosis Date Noted  . Bipolar I disorder, most recent episode depressed (HCC) [F31.30] 05/23/2013  . Cannabis abuse [F12.10] 03/30/2012  . ADHD (attention deficit hyperactivity disorder), combined type [F90.2] 08/28/2011  . Conduct disorder, adolescent onset type [F91.2] 08/28/2011   Total Time spent with patient: 25 minutes  Past Psychiatric History - has had prior psychiatric admissions , first time 2014. Has had prior suicide attempts by overdosing and hanging self . Describes short term but severe mood swings, usually lasting only a few minutes, hours- does not endorse clear history of discrete periods of mania, denies history of psychosis, at this time denies history of PTSD. Describes history of panic attacks . States he has been taking Depakote, which is prescribed at [facility]. Denies side  effects .    Past Medical History:  Past Medical History  Diagnosis Date  . ADHD (attention deficit hyperactivity disorder)   . Unspecified episodic mood disorder   . Oppositional defiant disorder   . Bipolar disorder (HCC)   . Depression     Past Surgical History  Procedure Laterality Date  . No past surgeries     Family History:  Family History  Problem Relation Age of Onset  . Depression Mother   . Diabetes Other   . Hyperlipidemia Other   . Hypertension Other    Family Psychiatric History: Denies history of mental illness in family, denies any history of suicides in family, father has history of alcohol dependence   Social History: Single, no children, Currently homeless after being kicked out of [facility], where he had been living for a year. Currently on probation. States he has been in a gang . States he is employed x 1 month History  Alcohol Use  . 3.6 oz/week  . 6 Cans of beer per week    Comment: rarely     History  Drug Use No    Comment: pt reports a hx of THC use    Social History   Social History  . Marital Status: Single    Spouse Name: N/A  . Number of Children: N/A  . Years of Education: N/A   Occupational History  . student     12th grade  at [employer]   Social History Main Topics  . Smoking status: Current Every Day Smoker -- 0.50 packs/day for 5 years    Types: Cigarettes  . Smokeless tobacco: Never Used  . Alcohol Use: 3.6 oz/week    6 Cans of beer per week     Comment: rarely  . Drug Use: No     Comment: pt reports a hx of THC use  . Sexual Activity:    Partners: Female    Copy: Condom     Comment: Pt reports that he is not sexually active   Other Topics Concern  . None   Social History Narrative   Additional Social History:   Sleep: Fair  Appetite:  Good  Current Medications: Current Facility-Administered Medications  Medication Dose Route Frequency Provider Last Rate Last Dose  .  acetaminophen (TYLENOL) tablet 650 mg  650 mg Oral Q6H PRN Earney Navy, NP   650 mg at 06/05/15 0817  . albuterol (PROVENTIL HFA;VENTOLIN HFA) 108 (90 BASE) MCG/ACT inhaler 1-2 puff  1-2 puff Inhalation Q4H PRN Beau Fanny, FNP   2 puff at 06/05/15 1713  . alum & mag hydroxide-simeth (MAALOX/MYLANTA) 200-200-20 MG/5ML suspension 30 mL  30 mL Oral Q4H PRN Earney Navy, NP      . divalproex (DEPAKOTE ER) 24 hr tablet 1,000 mg  1,000 mg Oral QHS Craige Cotta, MD   1,000 mg at 06/05/15 2055  . magnesium hydroxide (MILK OF MAGNESIA) suspension 30 mL  30 mL Oral Daily PRN Earney Navy, NP   30 mL at 06/03/15 2122  . meloxicam (MOBIC) tablet 15 mg  15 mg Oral Daily Beau Fanny, FNP   15 mg at 06/06/15 1029  . multivitamin with minerals tablet 1 tablet  1 tablet Oral Daily Craige Cotta, MD   1 tablet at 06/06/15 1029  . nicotine polacrilex (NICORETTE) gum 2 mg  2 mg Oral PRN Craige Cotta, MD   2 mg at 06/06/15 1028  . pneumococcal 23 valent vaccine (PNU-IMMUNE) injection 0.5 mL  0.5 mL Intramuscular Tomorrow-1000 Rockey Situ Cobos, MD   0.5 mL at 06/06/15 0821  . thiamine (VITAMIN B-1) tablet 100 mg  100 mg Oral Daily Rockey Situ Cobos, MD   100 mg at 06/06/15 1029  . traZODone (DESYREL) tablet 50 mg  50 mg Oral QHS Craige Cotta, MD   50 mg at 06/05/15 2055  . [START ON 06/07/2015] ziprasidone (GEODON) capsule 20 mg  20 mg Oral Q breakfast Lemoyne Nestor, MD      . ziprasidone (GEODON) capsule 60 mg  60 mg Oral Q supper Jomarie Longs, MD        Lab Results:  No results found for this or any previous visit (from the past 48 hour(s)).  Physical Findings: AIMS:  , ,  ,  ,    CIWA:  CIWA-Ar Total: 0 COWS:     Musculoskeletal: Strength & Muscle Tone: within normal limits- no tremors, no diaphoresis, no psychomotor agitation or restlessness  Gait & Station: normal Patient leans: N/A  Psychiatric Specialty Exam: Review of Systems  Psychiatric/Behavioral:  Positive for depression, suicidal ideas and substance abuse. Negative for hallucinations. The patient is nervous/anxious and has insomnia.   All other systems reviewed and are negative. - denies headache, denies chest pain, denies shortness of breath, no vomiting, denies dysuria, denies polyuria   Blood pressure 128/84, pulse 88, temperature 97.3 F (36.3 C), temperature source  Oral, resp. rate 18, height 5\' 9"  (1.753 m), weight 105.235 kg (232 lb).Body mass index is 34.24 kg/(m^2).  General Appearance:  Improved grooming   Eye Contact::  Good  Speech:  Normal Rate  Volume:  Normal  Mood:  Still feels depressed   Affect: depressed  Thought Process:  Linear  Orientation:  Full (Time, Place, and Person)  Thought Content:  denies hallucinations, no delusions, not internally preoccupied  but appears to be paranoid towards other patients  Suicidal Thoughts:  Yes.  with intent/plan pt wants to cheek his medications and OD on them when he has enough  Homicidal Thoughts:  Yes.  without intent/plan was homicidal last night towards another pt - however continues to be angry, able to cope .  Memory: immediate - fair recent and remote grossly intact   Judgement:   improving  Insight:   improving   Psychomotor Activity:  Restlessness  Concentration:  Poor  Recall:  Good  Fund of Knowledge:Good  Language: Good  Akathisia:  Negative  Handed:  Right  AIMS (if indicated):     Assets:  Desire for Improvement Physical Health Resilience  ADL's:  Intact  Cognition: WNL  Sleep:  Number of Hours: 6.75     Treatment Plan Summary: Daily contact with patient to assess and evaluate symptoms and progress in treatment, Medication management, Plan inpatient admission and medications as below  Will continue  Depakote  ER 1000mg  QHS for management of mood disorder, explosive anger . Depakote level on 06/07/15. Continue Ativan PRNs  For possible alcohol WDL as per CIWA score, if needed Continue Trazodone 50  mgrs QHS PRN for insomnia as needed Increase  Geodon to 20 mg qam and  60 mg QHS for management of mood disorder  EKG reviewed; WNL Qtc low 400's Continue to encourage group, milieu participation to work on coping skills and symptom reduction  Lively Haberman, MD 06/06/2015, 1:07 PM

## 2015-06-06 NOTE — Progress Notes (Signed)
D: Pt has angry affect and mood.  He has poor boundaries with peers and requires frequent redirection.  He seeks attention and is oppositional to staff.  He is flirtatious with multiple peers on 2 halls.  He was verbally aggressive and threatened peer earlier tonight.  Pt reports his goal is "anger again."  He reports he had a "rough day today, had to go in the quiet room because I was feeling suicidal."  Pt reports SI with plan to overdose.  He verbally contracts for safety.  When asked if he was having HI, he reports "not right now."  Pt reports he will notify staff of any thoughts of harming self or others.  He denies having pain, reporting "the new med works awesome."  Pt has been disruptive to the milieu and requires frequent redirection for trying to go to the 300 hall.  Pt is more focused on impressing his peers than getting treatment.   A:  Met with pt 1:1 and provided support and encouragement.  Actively listened to pt.  Pt moved to 500 hall for safety.  Medications administered per order.   R: Pt is compliant with medications.  Pt verbally contracts for safety.  Will continue to monitor and assess.

## 2015-06-06 NOTE — Progress Notes (Signed)
[facility] Group Notes:  (Nursing/MHT/Case Management/Adjunct)  Date:  06/06/2015  Time:  12:14 AM  Type of Therapy:  Psychoeducational Skills  Participation Level:  Active  Participation Quality:  Inattentive and Redirectable  Affect:  Flat  Cognitive:  Lacking  Insight:  Lacking  Engagement in Group:  Resistant  Modes of Intervention:  Education  Summary of Progress/Problems: The patient shared in group that he had a rough start to his day, however, his day improved with time. He would not share in group what transpired during his day. The patient required redirection for talking out of turn and for speaking to his peers. He was unable to state who his support system is going to be.   Shimshon Narula S 06/06/2015, 12:14 AM

## 2015-06-06 NOTE — Progress Notes (Signed)
Adult Psychoeducational Group Note  Date:  06/06/2015 Time:  8:35 PM  Group Topic/Focus:  Wrap-Up Group:   The focus of this group is to help patients review their daily goal of treatment and discuss progress on daily workbooks.  Participation Level:  Active  Participation Quality:  Appropriate  Affect:  Appropriate  Cognitive:  Appropriate  Insight: Appropriate  Engagement in Group:  Engaged  Modes of Intervention:  Discussion  Additional Comments:  Pt rated overall day a 5 out of 10 because he was moved to another hall. Pt reported that his goal for the day was to work on his anger, which he feels that he achieved "to a certain extent".   Cleotilde NeerJasmine S Kamil Hanigan 06/06/2015, 10:39 PM
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
## 2015-06-07 LAB — VALPROIC ACID LEVEL: VALPROIC ACID LVL: 46 ug/mL — AB (ref 50.0–100.0)

## 2015-06-07 MED ORDER — KETOROLAC TROMETHAMINE 15 MG/ML IJ SOLN
15.0000 mg | Freq: Once | INTRAMUSCULAR | Status: AC
  Administered 2015-06-07: 15 mg via INTRAMUSCULAR
  Filled 2015-06-07 (×2): qty 1

## 2015-06-07 MED ORDER — CYCLOBENZAPRINE HCL 10 MG PO TABS
5.0000 mg | ORAL_TABLET | Freq: Three times a day (TID) | ORAL | Status: DC
  Administered 2015-06-07 – 2015-06-09 (×5): 5 mg via ORAL
  Filled 2015-06-07 (×10): qty 1

## 2015-06-07 MED ORDER — ZIPRASIDONE HCL 40 MG PO CAPS
40.0000 mg | ORAL_CAPSULE | Freq: Every day | ORAL | Status: DC
  Administered 2015-06-08 – 2015-06-09 (×2): 40 mg via ORAL
  Filled 2015-06-07 (×3): qty 1

## 2015-06-07 MED ORDER — DIVALPROEX SODIUM ER 250 MG PO TB24
1250.0000 mg | ORAL_TABLET | Freq: Every day | ORAL | Status: DC
  Administered 2015-06-07 – 2015-06-08 (×2): 1250 mg via ORAL
  Filled 2015-06-07 (×3): qty 5

## 2015-06-07 MED ORDER — CEFAZOLIN SODIUM 500 MG IJ SOLR
500.0000 mg | Freq: Once | INTRAMUSCULAR | Status: AC
  Administered 2015-06-07: 500 mg via INTRAMUSCULAR
  Filled 2015-06-07: qty 500

## 2015-06-07 MED ORDER — DICLOXACILLIN SODIUM 250 MG PO CAPS
250.0000 mg | ORAL_CAPSULE | Freq: Three times a day (TID) | ORAL | Status: DC
  Administered 2015-06-08 – 2015-06-09 (×4): 250 mg via ORAL
  Filled 2015-06-07 (×3): qty 1
  Filled 2015-06-07: qty 21
  Filled 2015-06-07 (×4): qty 1
  Filled 2015-06-07: qty 21
  Filled 2015-06-07: qty 1
  Filled 2015-06-07: qty 21

## 2015-06-07 MED ORDER — ACAMPROSATE CALCIUM 333 MG PO TBEC
666.0000 mg | DELAYED_RELEASE_TABLET | Freq: Three times a day (TID) | ORAL | Status: DC
  Administered 2015-06-07 – 2015-06-09 (×5): 666 mg via ORAL
  Filled 2015-06-07 (×10): qty 2

## 2015-06-07 MED ORDER — IBUPROFEN 600 MG PO TABS
600.0000 mg | ORAL_TABLET | Freq: Four times a day (QID) | ORAL | Status: DC | PRN
  Administered 2015-06-08: 600 mg via ORAL
  Filled 2015-06-07: qty 1
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
## 2015-06-07 NOTE — Clinical Social Work Note (Signed)
CSW left voicemail for patient's probation officer Michelle PiperJustin Katz [phone] per patient' request. Awaiting return call.  Samuella BruinKristin Kerrigan Gombos, MSW, [employer]CS[employer] Clinical Social Worker [facility] [phone]

## 2015-06-07 NOTE — Significant Event (Cosign Needed)
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 Will proceed with Ancef 500 mg IM and Toradol 15 mg IM x one. Proceed with 7 - 10 day course of dicloxacillin 250 mg  q  8 hours. Warm compresses prn, and po motrin prn. may need I & D   On Call MD:  Lolly MustacheArfeen MD

## 2015-06-07 NOTE — Progress Notes (Signed)
Patient ID: Ryan Bartlett, male   DOB: [birth year], 19 y.o.   MRN: [id number] D: Patient alert and cooperative. Pt presented with depressed mood and flat affect. Pt endorses passive suicidal ideation and contracts for safety. Pt c/o pain from abscess on anteroir neck. Frederico Hamman, [place] notified. New orders of ancef and toradol given. Pt attended evening wrap up group and Interacted appropriately with peers. Pt denies any needs or concerns. No acute distressed noted.   A: Met with pt 1:1. Medications administered as prescribed. Writer encouraged pt to discuss feelings and come to staff with any question or concerns.   R: Patient remains safe. He is complaint with medications and denies any adverse reaction.

## 2015-06-07 NOTE — Progress Notes (Addendum)
Patient ID: Ryan Bartlett, male   DOB: [birth year], 19 y.o.   MRN: [id number] [facility] MD Progress Note  06/07/2015 1:26 PM Ryan Bartlett  MRN:  [id number] Subjective:  Patient states: "I still feel suicidal and I am also homicidal today towards this patient on the unit.'   Objective: Pt is a 19 y old AAM , who has a hx of bipolar do as well as cannabis abuse , alcohol abuse , presented to the ED with SI with plan to OD .Pt seen and chart reviewed.  Pt today continues to be depressed, has mood lability as well as endorses SI with a plan to OD on pills as well as HI towards another patient on the unit. Pt continues to be having irritability , mood sx. Pt later on was able to come up to writer and state that he will be able to contract for safety and that he feels this way only because he is on the same unit with the patient with whom he has had conflict with two days ago.    Principal Problem: Bipolar I disorder, most recent episode depressed (HCC) Diagnosis:   Patient Active Problem List   Diagnosis Date Noted  . Cannabis use disorder, moderate, dependence (HCC) [F12.20] 06/06/2015  . Alcohol use disorder, moderate, dependence (HCC) [F10.20] 06/06/2015  . Bipolar I disorder, most recent episode depressed (HCC) [F31.30] 05/23/2013  . Cannabis abuse [F12.10] 03/30/2012  . ADHD (attention deficit hyperactivity disorder), combined type [F90.2] 08/28/2011  . Conduct disorder, adolescent onset type [F91.2] 08/28/2011   Total Time spent with patient: 25 minutes  Past Psychiatric History - has had prior psychiatric admissions , first time 2014. Has had prior suicide attempts by overdosing and hanging self . Describes short term but severe mood swings, usually lasting only a few minutes, hours- does not endorse clear history of discrete periods of mania, denies history of psychosis, at this time denies history of PTSD. Describes history of panic attacks . States he has been taking Depakote, which is  prescribed at [facility]. Denies side effects .    Past Medical History:  Past Medical History  Diagnosis Date  . ADHD (attention deficit hyperactivity disorder)   . Unspecified episodic mood disorder   . Oppositional defiant disorder   . Bipolar disorder (HCC)   . Depression     Past Surgical History  Procedure Laterality Date  . No past surgeries     Family History:  Family History  Problem Relation Age of Onset  . Depression Mother   . Diabetes Other   . Hyperlipidemia Other   . Hypertension Other    Family Psychiatric History: Denies history of mental illness in family, denies any history of suicides in family, father has history of alcohol dependence   Social History: Single, no children, Currently homeless after being kicked out of [facility] Home, where he had been living for a year. Currently on probation. States he has been in a gang . States he is employed x 1 month History  Alcohol Use  . 3.6 oz/week  . 6 Cans of beer per week    Comment: rarely     History  Drug Use No    Comment: pt reports a hx of THC use    Social History   Social History  . Marital Status: Single    Spouse Name: N/A  . Number of Children: N/A  . Years of Education: N/A   Occupational History  . 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 Smoking status: Current Every Day Smoker -- 0.50 packs/day for 5 years    Types: Cigarettes  . Smokeless tobacco: Never Used  . Alcohol Use: 3.6 oz/week    6 Cans of beer per week     Comment: rarely  . Drug Use: No     Comment: pt reports a hx of THC use  . Sexual Activity:    Partners: Female    CopyBirth Control/ Protection: Condom     Comment: Pt reports that he is not sexually active   Other Topics Concern  . None   Social History Narrative   Additional Social History:   Sleep: Fair  Appetite:  Good  Current Medications: Current Facility-Administered Medications  Medication Dose Route Frequency Provider Last  Rate Last Dose  . acetaminophen (TYLENOL) tablet 650 mg  650 mg Oral Q6H PRN Earney NavyJosephine C Onuoha, NP   650 mg at 06/07/15 1012  . albuterol (PROVENTIL HFA;VENTOLIN HFA) 108 (90 BASE) MCG/ACT inhaler 1-2 puff  1-2 puff Inhalation Q4H PRN Beau FannyJohn C Withrow, FNP   2 puff at 06/05/15 1713  . alum & mag hydroxide-simeth (MAALOX/MYLANTA) 200-200-20 MG/5ML suspension 30 mL  30 mL Oral Q4H PRN Earney NavyJosephine C Onuoha, NP      . divalproex (DEPAKOTE ER) 24 hr tablet 1,250 mg  1,250 mg Oral QHS Avanti Jetter, MD      . magnesium hydroxide (MILK OF MAGNESIA) suspension 30 mL  30 mL Oral Daily PRN Earney NavyJosephine C Onuoha, NP   30 mL at 06/03/15 2122  . meloxicam (MOBIC) tablet 15 mg  15 mg Oral Daily Beau FannyJohn C Withrow, FNP   15 mg at 06/07/15 16100807  . multivitamin with minerals tablet 1 tablet  1 tablet Oral Daily Craige CottaFernando A Cobos, MD   1 tablet at 06/07/15 0807  . nicotine polacrilex (NICORETTE) gum 2 mg  2 mg Oral PRN Craige CottaFernando A Cobos, MD   2 mg at 06/07/15 1259  . thiamine (VITAMIN B-1) tablet 100 mg  100 mg Oral Daily Craige CottaFernando A Cobos, MD   100 mg at 06/07/15 0807  . traZODone (DESYREL) tablet 50 mg  50 mg Oral QHS Craige CottaFernando A Cobos, MD   50 mg at 06/06/15 2130  . [START ON 06/08/2015] ziprasidone (GEODON) capsule 40 mg  40 mg Oral Q breakfast Youcef Klas, MD      . ziprasidone (GEODON) capsule 60 mg  60 mg Oral Q supper Jomarie LongsSaramma Pavel Gadd, MD   60 mg at 06/06/15 1645    Lab Results:  Results for orders placed or performed during the hospital encounter of 05/31/15 (from the past 48 hour(s))  Valproic acid level     Status: Abnormal   Collection Time: 06/07/15  6:35 AM  Result Value Ref Range   Valproic Acid Lvl 46 (L) 50.0 - 100.0 ug/mL    Comment[facility]    Physical Findings: AIMS: Facial and Oral Movements Muscles of Facial Expression: None, normal Lips and Perioral Area: None, normal Jaw: None, normal Tongue: None, normal,Extremity Movements Upper (arms, wrists, hands,  fingers): None, normal Lower (legs, knees, ankles, toes): None, normal, Trunk Movements Neck, shoulders, hips: None, normal, Overall Severity Severity of abnormal movements (highest score from questions above): None, normal Incapacitation due to abnormal movements: None, normal Patient's awareness of abnormal movements (rate only patient's report): No Awareness,    CIWA:  CIWA-Ar Total: 0 COWS:  Musculoskeletal: Strength & Muscle Tone: within normal limits- no tremors, no diaphoresis, no psychomotor agitation or restlessness  Gait & Station: normal Patient leans: N/A  Psychiatric Specialty Exam: Review of Systems  Psychiatric/Behavioral: Positive for depression, suicidal ideas and substance abuse. Negative for hallucinations. The patient is nervous/anxious.   All other systems reviewed and are negative. - denies headache, denies chest pain, denies shortness of breath, no vomiting, denies dysuria, denies polyuria   Blood pressure 132/79, pulse 72, temperature 98.3 F (36.8 C), temperature source Oral, resp. rate 18, height  (1.753 m), weight 105.235 kg (232 lb).Body mass index is 34.24 kg/(m^2).  General Appearance:  Improved grooming   Eye Contact::  Good  Speech:  Normal Rate  Volume:  Normal  Mood:  Still feels depressed   Affect: depressed  Thought Process:  Linear  Orientation:  Full (Time, Place, and Person)  Thought Content:  denies hallucinations, no delusions, not internally preoccupied  but appears to be paranoid towards other patients  Suicidal Thoughts:  Yes.  with intent/plan pt wants to cheek his medications and OD on them when he has enough  Homicidal Thoughts:  Yes.  without intent/plan HI towards a particular pt on the unit  Memory: immediate - fair recent and remote grossly intact   Judgement:   improving  Insight:   improving   Psychomotor Activity:  Restlessness  Concentration:  Poor  Recall:  Good  Fund of Knowledge:Good  Language: Good  Akathisia:   Negative  Handed:  Right  AIMS (if indicated):     Assets:  Desire for Improvement Physical Health Resilience  ADL's:  Intact  Cognition: WNL  Sleep:  Number of Hours: 6.5     Treatment Plan Summary: Daily contact with patient to assess and evaluate symptoms and progress in treatment, Medication management, Plan inpatient admission and medications as below  Will increase Depakote  ER 1250 mg po  QHS for management of mood disorder, explosive anger . Depakote level on 06/07/15- 46 ( subtherapeutic)  Continue Trazodone 50 mgrs QHS PRN for insomnia as needed Increase  Geodon to 40 mg qam and  60 mg QHS for management of mood disorder  EKG reviewed; WNL Qtc low 400's Continue to encourage group, milieu participation to work on coping skills and symptom reduction. Patient to be transferred to 300 H , since he has conflict with another patient on this unit. CSW will work on disposition - pt to follow up with [facility] for substance abuse treatment program. Kamora Vossler, MD 06/07/2015, 1:26 PM

## 2015-06-07 NOTE — Progress Notes (Signed)
Patient ID: Ryan Bartlett, male   DOB: 26-Nov-1995, 19 y.o.   MRN: [id number]  D  ---  NURSE  NOTE  --  0840.,  06/07/15  ---   Pt. Placed on 1:1 observation  By Dr. After making homicidal comments about another pt. On the same unit.  Pt. Had issues with this other pt. Yesterday.  Pt. Has been calm and polite to staff and agreed to contract for safety.  Pt. Takes AM meds as asked.  He haas made no hostile or aggressive moves to staff or the peer at this time.  Pt. Ambulates hall and has sitter at his side at all times.  --- A  --  1:1 for pt. And peer safety  --  R ---  Pt. safe on unit with sitter present

## 2015-06-07 NOTE — Tx Team (Addendum)
Interdisciplinary Treatment Plan Update (Adult) Date: 06/07/2015   Date: 06/07/2015 1:54 PM  Progress in Treatment:  Attending groups: Yes  Participating in groups: Yes  Taking medication as prescribed: Yes  Tolerating medication: Yes  Family/Significant othe contact made: No, Pt declines family contact Patient understands diagnosis: Yes Discussing patient identified problems/goals with staff: Yes  Medical problems stabilized or resolved: Yes  Denies suicidal/homicidal ideation: Endorses passive SI but which appears to be chronic since admission or possibly malingering due to social issues  Patient has not harmed self or Others: Yes   New problem(s) identified: None identified at this time.   Discharge Plan or Barriers: [facility] screening on 10/20  Additional comments: n/a   Reason for Continuation of Hospitalization:  Anxiety Depression Medication stabilization Suicidal ideation  Estimated length of stay: 3-5 days  Review of initial/current patient goals per problem list:   1.  Goal(s): Patient will participate in aftercare plan  Met:  Yes  Target date: 3-5 days from date of admission   As evidenced by: Patient will participate within aftercare plan AEB aftercare provider and housing plan at discharge being identified.   06/02/15: Pt will go to residential substance abuse treatment.   2.  Goal (s): Patient will exhibit decreased depressive symptoms and suicidal ideations.  Met:  No  Target date: 3-5 days from date of admission   As evidenced by: Patient will utilize self rating of depression at 3 or below and demonstrate decreased signs of depression or be deemed stable for discharge by MD. 10/13: Pt was admitted with symptoms of depression, rating 10/10. Pt continues to present with flat affect and depressive symptoms.  Pt will demonstrate decreased symptoms of depression and rate depression at 3/10 or lower prior to discharge. 10/18: Goal  Progressing 10/19: Adequate for discharge per MD. Patient continues to endorse symptoms of depression but appears to be baseline. Patient is observed interacting appropriately with others in the milieu.  3.  Goal(s): Patient will demonstrate decreased signs and symptoms of anxiety.  Met:  No  Target date: 3-5 days from date of admission   As evidenced by: Patient will utilize self rating of anxiety at 3 or below and demonstrated decreased signs of anxiety, or be deemed stable for discharge by MD 06/02/15: Pt was admitted with increased levels of anxiety and is currently rating those symptoms highly. Pt will demonstrated decreased symptoms of anxiety and rate it at 3/10 prior to d/c. 10/18: Goal Progressing. 10/19: Adequate for discharge per MD.   Attendees: Patient:  06/07/2015 9:52 AM   Family:  06/07/2015 9:52 AM   Physician: Ursula Alert, MD 06/07/2015 9:52 AM   Nursing: Mayra Neer, Izora Gala RN 06/07/2015 9:52 AM   CSW: Erasmo Downer Nkenge Sonntag, [place]  06/07/2015 9:52 AM   Other:  06/07/2015 9:52 AM   Other:  06/07/2015 9:52 AM   Other: Lars Pinks, Nurse CM 06/07/2015 9:52 AM   Other: Lucinda Dell, Beverly Sessions TCT 06/07/2015 9:52 AM   Other:  06/07/2015 9:52 AM   Other:  06/07/2015 9:52 AM                    Tilden Fossa, MSW, [place] Clinical Social Worker [facility] [phone]

## 2015-06-07 NOTE — Plan of Care (Signed)
Problem: Diagnosis: Increased Risk For Suicide Attempt Goal: STG-Patient Will Comply With Medication Regime Outcome: Progressing Pt compliant with medication regime     

## 2015-06-07 NOTE — Plan of Care (Signed)
Problem: Ineffective individual coping Goal: STG: Patient will remain free from self harm Outcome: Progressing Pt safe on the unit     

## 2015-06-07 NOTE — Progress Notes (Signed)
Patient ID: Ryan Bartlett, male   DOB: Jul 14, 1996, 19 y.o.   MRN: [id number] POST 1:1 NOTE  --   06/07/15  ---  1000 hrs.  ---  Pt. Removed from 1:1 observations and moved to 300 hall.  Pt. Was polite and receptive to staff and made no aggressive behaviors.  Pt. Talked casually with staff and denied pain or dis-comfort.  He appeared relaxed and happy to be on a different hall.  --- A ---  End 1:1 observation as ordered.  --- R --   Pt. Moved to 300 hall and safe at this time

## 2015-06-07 NOTE — Progress Notes (Signed)
Patient ID: Ryan Bartlett, male   DOB: [birth year], 19 y.o.   MRN: [id number]  Adult Psychoeducational Group Note  Date:  06/07/2015 Time: 09:35am  Group Topic/Focus:  Recovery Goals:   The focus of this group is to identify appropriate goals for recovery and establish a plan to achieve them.  Participation Level:  Active  Participation Quality:  Attentive, Monopolizing and Redirectable  Affect:  Anxious  Cognitive:  Alert and Oriented  Insight: Improving  Engagement in Group:  Engaged and Monopolizing  Modes of Intervention:  Activity, Education and Support  Additional Comments:  Pt engaged in group, pt talks over other pts and veers off topic. Pt is redirectable. Pt able to identify one daily goal to accomplish today.   [name], Ryan Bartlett 06/07/2015, 10:57 AM

## 2015-06-07 NOTE — Progress Notes (Addendum)
D: Pt SI/ HI- contracts for safety. denies AVH. Pt is pleasant and cooperative. Pt continues to be very needy, attention-seeking and childlike. Pt concerned about trying to move back to the 400 hall. Pt complained of R-knee pain , but pt did not mention anything before going to bed. Pt complaining he was having EPS, twitching from not taking Haldol, pt was given 1x benadryl- 50 mg.   A: Pt was offered support and encouragement. Pt was given scheduled medications. Pt was encourage to attend groups. Q 15 minute checks were done for safety.   R:Pt attends groups and interacts well with peers and staff. Pt is taking medication. Pt receptive to treatment and safety maintained on unit.

## 2015-06-07 NOTE — Progress Notes (Signed)
Adult Psychoeducational Group Note  Date:  06/07/2015 Time:  8:38 PM  Group Topic/Focus:  Wrap-Up Group:   The focus of this group is to help patients review their daily goal of treatment and discuss progress on daily workbooks.  Participation Level:  Active  Participation Quality:  Appropriate  Affect:  Appropriate  Cognitive:  Appropriate  Insight: Good  Engagement in Group:  Engaged  Modes of Intervention:  Discussion  Additional Comments:  Pt rated overall day a 5 out of 10 because "I moved halls, finally", which was the highlight of his day. Pt also reported that his goal for the day was to work on his anger, which he feels that he achieved.   Cleotilde NeerJasmine S Alessandra Sawdey 06/07/2015, 10:37 PM

## 2015-06-08 MED ORDER — DICLOXACILLIN SODIUM 250 MG PO CAPS: 250.0000 mg | ORAL_CAPSULE | Freq: Three times a day (TID) | ORAL | Status: DC

## 2015-06-08 MED ORDER — CYCLOBENZAPRINE HCL 5 MG PO TABS: 5.0000 mg | ORAL_TABLET | Freq: Three times a day (TID) | ORAL | Status: DC

## 2015-06-08 MED ORDER — ZIPRASIDONE HCL 40 MG PO CAPS: 40.0000 mg | ORAL_CAPSULE | Freq: Every day | ORAL | Status: DC

## 2015-06-08 MED ORDER — ZIPRASIDONE HCL 60 MG PO CAPS: 60.0000 mg | ORAL_CAPSULE | Freq: Every day | ORAL | Status: DC

## 2015-06-08 MED ORDER — DIVALPROEX SODIUM ER 250 MG PO TB24: 1250.0000 mg | ORAL_TABLET | Freq: Every day | ORAL | Status: DC

## 2015-06-08 MED ORDER — ACAMPROSATE CALCIUM 333 MG PO TBEC: 666.0000 mg | DELAYED_RELEASE_TABLET | Freq: Three times a day (TID) | ORAL | Status: DC

## 2015-06-08 MED ORDER — TRAZODONE HCL 50 MG PO TABS: 50.0000 mg | ORAL_TABLET | Freq: Every evening | ORAL | Status: DC | PRN

## 2015-06-08 MED ORDER — TRAZODONE HCL 50 MG PO TABS: 50.0000 mg | ORAL_TABLET | Freq: Every day | ORAL | Status: DC

## 2015-06-08 NOTE — Discharge Summary (Signed)
Physician Discharge Summary Note  Patient:  Ryan Bartlett is an 19 y.o., male MRN:  [id number] DOB:  [birth year] Patient phone:  [phone] (home)  [place]   [place] [address],  Total Time spent with patient: 30 minutes  Date of Admission:  05/31/2015 Date of Discharge: 06/09/2015  Reason for Admission:  depression  Principal Problem: Bipolar I disorder, most recent episode depressed [facility](HCC) Discharge Diagnoses: Patient Active Problem List   Diagnosis Date Noted  . Cannabis use disorder, moderate, dependence (HCC) [F12.20] 06/06/2015  . Alcohol use disorder, moderate, dependence (HCC) [F10.20] 06/06/2015  . Bipolar I disorder, most recent episode depressed (HCC) [F31.30] 05/23/2013  . Cannabis abuse [F12.10] 03/30/2012  . ADHD (attention deficit hyperactivity disorder), combined type [F90.2] 08/28/2011  . Conduct disorder, adolescent onset type [F91.2] 08/28/2011    Musculoskeletal: Strength & Muscle Tone: within normal limits Gait & Station: normal Patient leans: N/A  Psychiatric Specialty Exam:  SEE MD SRA Physical Exam  Vitals reviewed. Psychiatric: His mood appears not anxious. Thought content is not paranoid. He expresses no homicidal and no suicidal ideation.    Review of Systems  All other systems reviewed and are negative.   Blood pressure 136/88, pulse 79, temperature 97.6 F (36.4 C), temperature source Oral, resp. rate 20, height 5\' 9"  (1.753 m), weight 105.235 kg (232 lb).Body mass index is 34.24 kg/(m^2).  Have you used any form of tobacco in the last 30 days? (Cigarettes, Smokeless Tobacco, Cigars, and/or Pipes): Yes  Has this patient used any form of tobacco in the last 30 days? (Cigarettes, Smokeless Tobacco, Cigars, and/or Pipes) N/A  Past Medical History:  Past Medical History  Diagnosis Date  . ADHD (attention deficit hyperactivity disorder)   . Unspecified episodic mood disorder   . Oppositional defiant disorder   . Bipolar  disorder (HCC)   . Depression     Past Surgical History  Procedure Laterality Date  . No past surgeries     Family History:  Family History  Problem Relation Age of Onset  . Depression Mother   . Diabetes Other   . Hyperlipidemia Other   . Hypertension Other    Social History:  History  Alcohol Use  . 3.6 oz/week  . 6 Cans of beer per week    Comment: rarely     History  Drug Use No    Comment: pt reports a hx of THC use    Social History   Social History  . Marital Status: Single    Spouse Name: N/A  . Number of Children: N/A  . Years of Education: N/A   Occupational History  . student     12th grade at [employer]   Social History Main Topics  . Smoking status: Current Every Day Smoker -- 0.50 packs/day for 5 years    Types: Cigarettes  . Smokeless tobacco: Never Used  . Alcohol Use: 3.6 oz/week    6 Cans of beer per week     Comment: rarely  . Drug Use: No     Comment: pt reports a hx of THC use  . Sexual Activity:    Partners: Female    CopyBirth Control/ Protection: Condom     Comment: Pt reports that he is not sexually active   Other Topics Concern  . None   Social History Narrative   Risk to Self: Is patient at risk for suicide?: Yes What has been your use of drugs/alcohol within the last 12 months?: since  August, drinking every day to the point of being drunk; drinks 1/5 liquor and case of peer Risk to Others:   Prior Inpatient Therapy:   Prior Outpatient Therapy:    Level of Care:  OP  Hospital Course:  Ryan Bartlett was admitted for Bipolar I disorder, most recent episode depressed (HCC) and crisis management.  He was treated discharged with the medications listed below under Medication List.  Medical problems were identified and treated as needed.  Home medications were restarted as appropriate.  Improvement was monitored by observation and Ryan Chatters daily report of symptom reduction.  Emotional and mental status was monitored by daily  self-inventory reports completed by Ryan Chatters and clinical staff.         Ryan Chatters was evaluated by the treatment team for stability and plans for continued recovery upon discharge.  Ryan Chatters motivation was an integral factor for scheduling further treatment.  Employment, transportation, bed availability, health status, family support, and any pending legal issues were also considered during his hospital stay.  He was offered further treatment options upon discharge including but not limited to Residential, Intensive Outpatient, and Outpatient treatment.  Ryan Chatters will follow up with the services as listed below under Follow Up Information.     Upon completion of this admission the patient was both mentally and medically stable for discharge denying suicidal/homicidal ideation, auditory/visual/tactile hallucinations, delusional thoughts and paranoia.      Consults:  psychiatry  Significant Diagnostic Studies:  labs: per ED  Discharge Vitals:   Blood pressure 136/88, pulse 79, temperature 97.6 F (36.4 C), temperature source Oral, resp. rate 20, height  (1.753 m), weight 105.235 kg (232 lb). Body mass index is 34.24 kg/(m^2). Lab Results:   Results for orders placed or performed during the hospital encounter of 05/31/15 (from the past 72 hour(s))  Valproic acid level     Status: Abnormal   Collection Time: 06/07/15  6:35 AM  Result Value Ref Range   Valproic Acid Lvl 46 (L) 50.0 - 100.0 ug/mL    Comment: Performed at [facility]    Physical Findings: AIMS: Facial and Oral Movements Muscles of Facial Expression: None, normal Lips and Perioral Area: None, normal Jaw: None, normal Tongue: None, normal,Extremity Movements Upper (arms, wrists, hands, fingers): None, normal Lower (legs, knees, ankles, toes): None, normal, Trunk Movements Neck, shoulders, hips: None, normal, Overall Severity Severity of abnormal movements (highest score  from questions above): None, normal Incapacitation due to abnormal movements: None, normal Patient's awareness of abnormal movements (rate only patient's report): No Awareness, Dental Status Current problems with teeth and/or dentures?: No Does patient usually wear dentures?: No  CIWA:  CIWA-Ar Total: 1 COWS:  COWS Total Score: 1   See Psychiatric Specialty Exam and Suicide Risk Assessment completed by Attending Physician prior to discharge.  Discharge destination:  Home  Is patient on multiple antipsychotic therapies at discharge:  No   Has Patient had three or more failed trials of antipsychotic monotherapy by history:  No    Recommended Plan for Multiple Antipsychotic Therapies: NA     Medication List    STOP taking these medications        haloperidol 10 MG tablet  Commonly known as:  HALDOL     hydrOXYzine 50 MG tablet  Commonly known as:  ATARAX/VISTARIL     ibuprofen 800 MG tablet  Commonly known as:  ADVIL,MOTRIN     MELATONIN EXTRA STRENGTH  PO     omega-3 acid ethyl esters 1 G capsule  Commonly known as:  LOVAZA      TAKE these medications      Indication   cyclobenzaprine 5 MG tablet  Commonly known as:  FLEXERIL  Take 1 tablet (5 mg total) by mouth 3 (three) times daily.   Indication:  Muscle Spasm     divalproex 250 MG 24 hr tablet  Commonly known as:  DEPAKOTE ER  Take 5 tablets (1,250 mg total) by mouth at bedtime.   Indication:  mood stabilization     traZODone 50 MG tablet  Commonly known as:  DESYREL  Take 1 tablet (50 mg total) by mouth at bedtime.   Indication:  Trouble Sleeping     ziprasidone 40 MG capsule  Commonly known as:  GEODON  Take 1 capsule (40 mg total) by mouth daily with breakfast.   Indication:  mood stabilization     ziprasidone 60 MG capsule  Commonly known as:  GEODON  Take 1 capsule (60 mg total) by mouth daily with supper.   Indication:  mod stabilization           Follow-up Information    Follow up with  [facility] On 06/09/2015.   Why:  at 8:00am for your initial screening. Please bring a photo ID with your [facility] address.   Contact information:   [address] [place], [place] [address] Phone: [phone]      Follow up with [facility].   Specialty:  Professional Counselor   Why:  Please walk-in between 8a-12p Monday-Friday to be seen by your doctor/therapist.   Contact information:   [facility] [address] [place] [address] [phone]       Follow-up recommendations:  Activity:  as tol Diet:  as tol  Comments:  1.  Take all your medications as prescribed.              2.  Report any adverse side effects to outpatient provider.                       3.  Patient instructed to not use alcohol or illegal drugs while on prescription medicines.            4.  In the event of worsening symptoms, instructed patient to call 911, the crisis hotline or go to nearest emergency room for evaluation of symptoms.  Total Discharge Time:  30 min  Signed: Velna Hatchet May Agustin AGNP-BC 06/08/2015, 3:09 PM  I personally assessed the patient and formulated the plan Madie Reno A. Dub Mikes, M.D.

## 2015-06-08 NOTE — Progress Notes (Signed)
[facility] MD Progress Note  06/08/2015 5:21 PM Ryan Bartlett  MRN:  [id number] Subjective:  [name] states he is ready to move on. He would have liked to go to an assisted living facility from here but states that he recognizes he needs to work some more on her addictoin Principal Problem: Bipolar I disorder, most recent episode depressed (HCC) Diagnosis:   Patient Active Problem List   Diagnosis Date Noted  . Cannabis use disorder, moderate, dependence (HCC) [F12.20] 06/06/2015  . Alcohol use disorder, moderate, dependence (HCC) [F10.20] 06/06/2015  . Bipolar I disorder, most recent episode depressed (HCC) [F31.30] 05/23/2013  . Cannabis abuse [F12.10] 03/30/2012  . ADHD (attention deficit hyperactivity disorder), combined type [F90.2] 08/28/2011  . Conduct disorder, adolescent onset type [F91.2] 08/28/2011   Total Time spent with patient: 30 minutes  Past Psychiatric History: see admission H and P  Past Medical History:  Past Medical History  Diagnosis Date  . ADHD (attention deficit hyperactivity disorder)   . Unspecified episodic mood disorder   . Oppositional defiant disorder   . Bipolar disorder (HCC)   . Depression     Past Surgical History  Procedure Laterality Date  . No past surgeries     Family History:  Family History  Problem Relation Age of Onset  . Depression Mother   . Diabetes Other   . Hyperlipidemia Other   . Hypertension Other    Family Psychiatric  History: see admission H and P Social History:  History  Alcohol Use  . 3.6 oz/week  . 6 Cans of beer per week    Comment: rarely     History  Drug Use No    Comment: pt reports a hx of THC use    Social History   Social History  . Marital Status: Single    Spouse Name: N/A  . Number of Children: N/A  . Years of Education: N/A   Occupational History  . student     12th grade at [employer]   Social History Main Topics  . Smoking status: Current Every Day Smoker -- 0.50 packs/day for 5 years    Types: Cigarettes  . Smokeless tobacco: Never Used  . Alcohol Use: 3.6 oz/week    6 Cans of beer per week     Comment: rarely  . Drug Use: No     Comment: pt reports a hx of THC use  . Sexual Activity:    Partners: Female    Copy: Condom     Comment: Pt reports that he is not sexually active   Other Topics Concern  . None   Social History Narrative   Additional Social History:                         Sleep: Fair  Appetite:  Fair  Current Medications: Current Facility-Administered Medications  Medication Dose Route Frequency Provider Last Rate Last Dose  . acamprosate (CAMPRAL) tablet 666 mg  666 mg Oral TID WC Rachael Fee, MD   666 mg at 06/08/15 1618  . acetaminophen (TYLENOL) tablet 650 mg  650 mg Oral Q6H PRN Earney Navy, NP   650 mg at 06/07/15 1618  . albuterol (PROVENTIL HFA;VENTOLIN HFA) 108 (90 BASE) MCG/ACT inhaler 1-2 puff  1-2 puff Inhalation Q4H PRN Beau Fanny, FNP   2 puff at 06/05/15 1713  . alum & mag hydroxide-simeth (MAALOX/MYLANTA) 200-200-20 MG/5ML suspension 30 mL  30 mL  Oral Q4H PRN Earney Navy, NP      . cyclobenzaprine (FLEXERIL) tablet 5 mg  5 mg Oral TID Rachael Fee, MD   5 mg at 06/08/15 1618  . dicloxacillin (DYNAPEN) capsule 250 mg  250 mg Oral 3 times per day Kerry Hough, PA-C   250 mg at 06/08/15 1500  . divalproex (DEPAKOTE ER) 24 hr tablet 1,250 mg  1,250 mg Oral QHS Jomarie Longs, MD   1,250 mg at 06/07/15 2221  . ibuprofen (ADVIL,MOTRIN) tablet 600 mg  600 mg Oral Q6H PRN Kerry Hough, PA-C      . magnesium hydroxide (MILK OF MAGNESIA) suspension 30 mL  30 mL Oral Daily PRN Earney Navy, NP   30 mL at 06/03/15 2122  . multivitamin with minerals tablet 1 tablet  1 tablet Oral Daily Craige Cotta, MD   1 tablet at 06/08/15 0981  . nicotine polacrilex (NICORETTE) gum 2 mg  2 mg Oral PRN Craige Cotta, MD   2 mg at 06/08/15 1527  . thiamine (VITAMIN B-1) tablet 100 mg  100 mg  Oral Daily Craige Cotta, MD   100 mg at 06/08/15 0824  . traZODone (DESYREL) tablet 50 mg  50 mg Oral QHS Craige Cotta, MD   50 mg at 06/07/15 2222  . ziprasidone (GEODON) capsule 40 mg  40 mg Oral Q breakfast Jomarie Longs, MD   40 mg at 06/08/15 0824  . ziprasidone (GEODON) capsule 60 mg  60 mg Oral Q supper Jomarie Longs, MD   60 mg at 06/08/15 1619    Lab Results:  Results for orders placed or performed during the hospital encounter of 05/31/15 (from the past 48 hour(s))  Valproic acid level     Status: Abnormal   Collection Time: 06/07/15  6:35 AM  Result Value Ref Range   Valproic Acid Lvl 46 (L) 50.0 - 100.0 ug/mL    Comment: Performed at [facility]    Physical Findings: AIMS: Facial and Oral Movements Muscles of Facial Expression: None, normal Lips and Perioral Area: None, normal Jaw: None, normal Tongue: None, normal,Extremity Movements Upper (arms, wrists, hands, fingers): None, normal Lower (legs, knees, ankles, toes): None, normal, Trunk Movements Neck, shoulders, hips: None, normal, Overall Severity Severity of abnormal movements (highest score from questions above): None, normal Incapacitation due to abnormal movements: None, normal Patient's awareness of abnormal movements (rate only patient's report): No Awareness, Dental Status Current problems with teeth and/or dentures?: No Does patient usually wear dentures?: No  CIWA:  CIWA-Ar Total: 1 COWS:  COWS Total Score: 1  Musculoskeletal: Strength & Muscle Tone: within normal limits Gait & Station: normal Patient leans: normal  Psychiatric Specialty Exam: Review of Systems  Constitutional: Negative.   HENT: Negative.   Eyes: Negative.   Respiratory: Negative.   Cardiovascular: Negative.   Gastrointestinal: Negative.   Genitourinary: Negative.   Musculoskeletal: Negative.   Skin: Negative.   Neurological: Negative.   Endo/Heme/Allergies: Negative.   Psychiatric/Behavioral:  Positive for substance abuse. The patient is nervous/anxious.     Blood pressure 136/88, pulse 79, temperature 97.6 F (36.4 C), temperature source Oral, resp. rate 20, height  (1.753 m), weight 105.235 kg (232 lb).Body mass index is 34.24 kg/(m^2).  General Appearance: Fairly Groomed  Patent attorney::  Fair  Speech:  Clear and Coherent  Volume:  fluctuates  Mood:  Anxious and worried  Affect:  Appropriate  Thought Process:  Coherent and Goal  Directed  Orientation:  Full (Time, Place, and Person)  Thought Content:  symptoms events worries concerns  Suicidal Thoughts:  No  Homicidal Thoughts:  No  Memory:  Immediate;   Fair Recent;   Fair Remote;   Fair  Judgement:  Fair  Insight:  Shallow  Psychomotor Activity:  Restlessness  Concentration:  Fair  Recall:  [employer]is[employer]Fair  Fund of Knowledge:Fair  Language: Fair  Akathisia:  No  Handed:  Right  AIMS (if indicated):     Assets:  Desire for Improvement  ADL's:  Intact  Cognition: WNL  Sleep:  Number of Hours: 5.25   Treatment Plan Summary: Daily contact with patient to assess and evaluate symptoms and progress in treatment and Medication management Supportive approach/coping skills Alcohol dependence; continue to work a relapse prevention plan Mood instability; continue to work with the Geodon and the Depakote Cravings; continue to work with the Campral Use CBT/mindfulness Facilitate [facility] the AM Kermit Arnette A 06/08/2015, 5:21 PM

## 2015-06-08 NOTE — Plan of Care (Signed)
Problem: Consults Goal: Depression Patient Education See Patient Education Module for education specifics.  Outcome: Progressing Nurse discussed depression/coping skills with patient.        

## 2015-06-08 NOTE — [facility] Group Notes (Signed)
[facility] [name] Group Therapy 06/08/2015  1:15 PM   Type of Therapy: Group Therapy  Participation Level: Did Not Attend. Patient invited to participate but declined.   Mylinda Brook, MSW, LCSWA Clinical Social Worker [facility] [phone]   

## 2015-06-08 NOTE — Progress Notes (Signed)
  [facility] Discharge Plan :  Will you be returning to the same living situation after discharge:  No. [facility] At discharge, do you have transportation home?: Yes,  patient will be provided with taxi voucher Do you have the ability to pay for your medications: Yes,  patient will be provided with prescription samples  Release of information consent forms completed and in the chart;  Patient's signature needed at discharge.  Patient to Follow up at: Follow-up Information    [facility] On 06/09/2015.   Why:  at 8:00am for your initial screening. Please [place].   Contact information:   [address] [place], [place] [address] Phone: [phone]      [facility].   Specialty:  Professional Counselor   Why:  Please walk-in between 8a-12p Monday-Friday to be seen by your doctor/therapist.   Contact information:   [facility] [address] [place] [address] 501 [phone]       Patient denies SI/HI: Yes,  patient declines SI    Safety Planning and Suicide Prevention discussed: Yes,  with patient  Have you used any form of tobacco in the last 30 days? (Cigarettes, Smokeless Tobacco, Cigars, and/or Pipes): Yes  Has patient been referred to the Quitline?: Patient refused referral  Shabnam Ladd, [place] CarboKristin L 06/08/2015, 4:19 PM

## 2015-06-08 NOTE — Progress Notes (Signed)
RN reviewed discharge information, prescriptions, medications with 2 other RN's.  Patient scheduled for early discharge tomorrow with taxi voucher.

## 2015-06-08 NOTE — [facility] Group Notes (Signed)
   [name] Note  06/08/2015  8:45 AM   Participation Quality: Alert, Appropriate and Oriented  Mood/Affect: Appropriate  Depression Rating: 8  Anxiety Rating: 8  Thoughts of Suicide: Pt endorses passive SI  Will you contract for safety? Yes  Current AVH: Pt denies  Plan for Discharge/Comments: Pt attended discharge planning group and actively participated in group. CSW provided pt with today's workbook. Patient has a [facility] screening scheduled for 06/09/15.  Transportation Means: Pt reports access to transportation  Supports: No supports mentioned at this time  Samuella BruinKristin Oluwaseyi Raffel, MSW, [employer]CSWA Clinical Social Worker [facility] [phone]

## 2015-06-08 NOTE — [facility] Suicide Risk Assessment (Signed)
[facility] Discharge Suicide Risk Assessment   Demographic Factors:  Male and Adolescent or young adult  Total Time spent with patient: 30 minutes  Musculoskeletal: Strength & Muscle Tone: within normal limits Gait & Station: normal Patient leans: normal  Psychiatric Specialty Exam: Physical Exam  Review of Systems  Constitutional: Negative.   HENT: Negative.   Eyes: Negative.   Respiratory: Negative.   Cardiovascular: Negative.   Gastrointestinal: Negative.   Genitourinary: Negative.   Musculoskeletal: Negative.   Skin: Negative.   Neurological: Negative.   Endo/Heme/Allergies: Negative.   Psychiatric/Behavioral: Positive for substance abuse. The patient is nervous/anxious.     Blood pressure 136/88, pulse 79, temperature 97.6 F (36.4 C), temperature source Oral, resp. rate 20, height  (1.753 m), weight 105.235 kg (232 lb).Body mass index is 34.24 kg/(m^2).  General Appearance: Fairly Groomed  Patent attorney::  Fair  Speech:  Clear and Coherent409  Volume:  fluctuates  Mood:  Euthymic  Affect:  Appropriate  Thought Process:  Coherent and Goal Directed  Orientation:  Full (Time, Place, and Person)  Thought Content:  plans as he moves on, relapse prevention plan  Suicidal Thoughts:  No  Homicidal Thoughts:  No  Memory:  Immediate;   Fair Recent;   Fair Remote;   Fair  Judgement:  Fair  Insight:  Shallow  Psychomotor Activity:  Normal  Concentration:  Fair  Recall:  [employer] of Knowledge:Fair  Language: Fair  Akathisia:  No  Handed:  Right  AIMS (if indicated):     Assets:  Desire for Improvement  Sleep:  Number of Hours: 5.25  Cognition: WNL  ADL's:  Intact   Have you used any form of tobacco in the last 30 days? (Cigarettes, Smokeless Tobacco, Cigars, and/or Pipes): Yes  Has this patient used any form of tobacco in the last 30 days? (Cigarettes, Smokeless Tobacco, Cigars, and/or Pipes) Yes, A prescription for an FDA-approved tobacco cessation medication was  offered at discharge and the patient refused  Mental Status Per Nursing Assessment::   On Admission:     Current Mental Status by Physician: IN full contact with reality. There are no active SI plans or intent. There are no active S/S of withdrawal. He is willing and motivated to continue to work on his long term sobriety. He plans to comply with his medication regime  Loss Factors: NA  Historical Factors: NA  Risk Reduction Factors:   wants to do better  Continued Clinical Symptoms:  Bipolar Disorder:   Depressive phase Alcohol/Substance Abuse/Dependencies  Cognitive Features That Contribute To Risk:  Closed-mindedness, Polarized thinking and Thought constriction (tunnel vision)    Suicide Risk:  Minimal: No identifiable suicidal ideation.  Patients presenting with no risk factors but with morbid ruminations; may be classified as minimal risk based on the severity of the depressive symptoms  Principal Problem: Bipolar I disorder, most recent episode depressed [facility]) Discharge Diagnoses:  Patient Active Problem List   Diagnosis Date Noted  . Cannabis use disorder, moderate, dependence (HCC) [F12.20] 06/06/2015  . Alcohol use disorder, moderate, dependence (HCC) [F10.20] 06/06/2015  . Bipolar I disorder, most recent episode depressed (HCC) [F31.30] 05/23/2013  . Cannabis abuse [F12.10] 03/30/2012  . ADHD (attention deficit hyperactivity disorder), combined type [F90.2] 08/28/2011  . Conduct disorder, adolescent onset type [F91.2] 08/28/2011    Follow-up Information    Follow up with [facility] On 06/09/2015.   Why:  at 8:00am for your initial screening. Please bring a photo ID with your [place] address.   Contact information:   [address] [place], [place] [address] Phone: [phone]      [facility]   Specialty:  Professional Counselor   Why:  Please walk-in between 8a-12p Monday-Friday to be seen by your  doctor/therapist.   Contact information:   [facility] [address] [place] [address] [phone]       Plan Of Care/Follow-up recommendations:  Activity:  as tolerated Diet:  regular Follow up [facility] Is patient on multiple antipsychotic therapies at discharge:  No   Has Patient had three or more failed trials of antipsychotic monotherapy by history:  No  Recommended Plan for Multiple Antipsychotic Therapies: NA    Estera Ozier A 06/08/2015, 5:28 PM

## 2015-06-08 NOTE — Progress Notes (Signed)
D:  Patient's self inventory sheet, patient has fair sleep, sleep medication is helpful.  Fair appetite, normal energy level, poor concentration.  Rated depression and anxiety 8, hopeless 9.  Withdrawals, cravings.  SI almost all the time.  Physical problems, right knee pain #8 in past 24 hours.  Pain medictiton is helpful.  Goal is to find out about housing. A:  Medications administered per MD orders.  Emotional support and encouragement given patient. R:  Denied SI and Hi, contracts for safety.  Denied A/V hallucinations.  Safety maintained with 15 minute checks.

## 2015-06-08 NOTE — Progress Notes (Signed)
Writer reviewed AVS with pt on 06/08/15. Pt will discharge on 06/09/15.

## 2015-06-09 NOTE — Progress Notes (Signed)
Pt attended NA group this evening.  

## 2015-06-09 NOTE — Progress Notes (Addendum)
Discharge Note:  [facility].  Patient denied SI and HI.  Denied A/V hallucinations.  Denied pain.  Suicide prevention information given and discussed with patient who stated he understood and had no questions.  Patient stated he received all his belongings, clothing, misc items, toiletries, prescriptions, medications, hat, tobacco products, kyocera, phone and headphones, wallet with cards, samsung phone with charger in bookbag, sneakers, belt.  Patient stated he appreciated all assistance [facility].

## 2015-07-06 ENCOUNTER — Emergency Department (HOSPITAL_COMMUNITY)
Admission: EM | Admit: 2015-07-06 | Discharge: 2015-07-09 | Disposition: A | Discharge status: Other Acute Inpatient Hospital | Payer: Medicaid Other | Attending: Emergency Medicine | Admitting: Emergency Medicine

## 2015-07-06 ENCOUNTER — Encounter (HOSPITAL_COMMUNITY): Payer: Self-pay | Admitting: Family Medicine

## 2015-07-06 DIAGNOSIS — F329 Major depressive disorder, single episode, unspecified: Secondary | ICD-10-CM | POA: Diagnosis present

## 2015-07-06 DIAGNOSIS — R45851 Suicidal ideations: Secondary | ICD-10-CM | POA: Diagnosis not present

## 2015-07-06 DIAGNOSIS — Z79899 Other long term (current) drug therapy: Secondary | ICD-10-CM | POA: Diagnosis not present

## 2015-07-06 DIAGNOSIS — F32A Depression, unspecified: Secondary | ICD-10-CM

## 2015-07-06 DIAGNOSIS — M25561 Pain in right knee: Secondary | ICD-10-CM | POA: Insufficient documentation

## 2015-07-06 DIAGNOSIS — F3130 Bipolar disorder, current episode depressed, mild or moderate severity, unspecified: Secondary | ICD-10-CM | POA: Insufficient documentation

## 2015-07-06 DIAGNOSIS — F17210 Nicotine dependence, cigarettes, uncomplicated: Secondary | ICD-10-CM | POA: Insufficient documentation

## 2015-07-06 LAB — COMPREHENSIVE METABOLIC PANEL
ALK PHOS: 60 U/L (ref 38–126)
ALT: 30 U/L (ref 17–63)
ANION GAP: 6 (ref 5–15)
AST: 22 U/L (ref 15–41)
Albumin: 4.5 g/dL (ref 3.5–5.0)
BUN: 9 mg/dL (ref 6–20)
CALCIUM: 9.6 mg/dL (ref 8.9–10.3)
CO2: 28 mmol/L (ref 22–32)
Chloride: 107 mmol/L (ref 101–111)
Creatinine, Ser: 0.87 mg/dL (ref 0.61–1.24)
Glucose, Bld: 97 mg/dL (ref 65–99)
POTASSIUM: 3.4 mmol/L — AB (ref 3.5–5.1)
SODIUM: 141 mmol/L (ref 135–145)
TOTAL PROTEIN: 7.9 g/dL (ref 6.5–8.1)
Total Bilirubin: 0.5 mg/dL (ref 0.3–1.2)

## 2015-07-06 LAB — RAPID URINE DRUG SCREEN, HOSP PERFORMED
Amphetamines: NOT DETECTED
BARBITURATES: NOT DETECTED
Benzodiazepines: NOT DETECTED
Cocaine: NOT DETECTED
OPIATES: NOT DETECTED
Tetrahydrocannabinol: NOT DETECTED

## 2015-07-06 LAB — CBC
HEMATOCRIT: 42.3 % (ref 39.0–52.0)
Hemoglobin: 14.1 g/dL (ref 13.0–17.0)
MCH: 28.4 pg (ref 26.0–34.0)
MCHC: 33.3 g/dL (ref 30.0–36.0)
MCV: 85.3 fL (ref 78.0–100.0)
PLATELETS: 244 10*3/uL (ref 150–400)
RBC: 4.96 MIL/uL (ref 4.22–5.81)
RDW: 13.4 % (ref 11.5–15.5)
WBC: 9.3 10*3/uL (ref 4.0–10.5)

## 2015-07-06 LAB — ETHANOL

## 2015-07-06 LAB — ACETAMINOPHEN LEVEL

## 2015-07-06 LAB — SALICYLATE LEVEL

## 2015-07-06 MED ORDER — ZIPRASIDONE HCL 20 MG PO CAPS
60.0000 mg | ORAL_CAPSULE | Freq: Every day | ORAL | Status: DC
  Administered 2015-07-06 – 2015-07-08 (×3): 60 mg via ORAL
  Filled 2015-07-06 (×3): qty 3

## 2015-07-06 MED ORDER — ZIPRASIDONE HCL 20 MG PO CAPS: 40.0000 mg | ORAL_CAPSULE | Freq: Every day | ORAL | Status: DC

## 2015-07-06 MED ORDER — ONDANSETRON HCL 4 MG PO TABS: 4.0000 mg | ORAL_TABLET | Freq: Three times a day (TID) | ORAL | Status: DC | PRN

## 2015-07-06 MED ORDER — ZOLPIDEM TARTRATE 5 MG PO TABS: 5.0000 mg | ORAL_TABLET | Freq: Every evening | ORAL | Status: DC | PRN

## 2015-07-06 MED ORDER — ZIPRASIDONE HCL 20 MG PO CAPS
40.0000 mg | ORAL_CAPSULE | Freq: Every day | ORAL | Status: DC
  Administered 2015-07-06 – 2015-07-08 (×3): 40 mg via ORAL
  Filled 2015-07-06 (×2): qty 2

## 2015-07-06 MED ORDER — TRAZODONE HCL 50 MG PO TABS
50.0000 mg | ORAL_TABLET | Freq: Every day | ORAL | Status: DC
  Administered 2015-07-06 – 2015-07-08 (×3): 50 mg via ORAL
  Filled 2015-07-06 (×2): qty 1

## 2015-07-06 MED ORDER — POTASSIUM CHLORIDE CRYS ER 20 MEQ PO TBCR
40.0000 meq | EXTENDED_RELEASE_TABLET | Freq: Once | ORAL | Status: AC
  Administered 2015-07-06: 40 meq via ORAL

## 2015-07-06 MED ORDER — ACETAMINOPHEN 325 MG PO TABS: 650.0000 mg | ORAL_TABLET | ORAL | Status: DC | PRN

## 2015-07-06 MED ORDER — LORAZEPAM 1 MG PO TABS: 1.0000 mg | ORAL_TABLET | Freq: Three times a day (TID) | ORAL | Status: DC | PRN

## 2015-07-06 MED ORDER — ACAMPROSATE CALCIUM 333 MG PO TBEC
666.0000 mg | DELAYED_RELEASE_TABLET | Freq: Three times a day (TID) | ORAL | Status: DC
  Administered 2015-07-06 – 2015-07-08 (×9): 666 mg via ORAL
  Filled 2015-07-06 (×13): qty 2

## 2015-07-06 MED ORDER — NICOTINE 14 MG/24HR TD PT24
14.0000 mg | MEDICATED_PATCH | Freq: Once | TRANSDERMAL | Status: AC
  Administered 2015-07-06: 14 mg via TRANSDERMAL

## 2015-07-06 MED ORDER — ALUM & MAG HYDROXIDE-SIMETH 200-200-20 MG/5ML PO SUSP: 30.0000 mL | ORAL | Status: DC | PRN

## 2015-07-06 MED ORDER — DIVALPROEX SODIUM ER 500 MG PO TB24
1250.0000 mg | ORAL_TABLET | Freq: Every day | ORAL | Status: DC
  Administered 2015-07-06 – 2015-07-08 (×3): 1250 mg via ORAL
  Filled 2015-07-06 (×5): qty 1

## 2015-07-06 MED ORDER — IBUPROFEN 200 MG PO TABS: 600.0000 mg | ORAL_TABLET | Freq: Three times a day (TID) | ORAL | Status: DC | PRN

## 2015-07-06 MED ORDER — NICOTINE 14 MG/24HR TD PT24
MEDICATED_PATCH | TRANSDERMAL | Status: AC
  Administered 2015-07-06: 14 mg
  Filled 2015-07-06: qty 1

## 2015-07-06 MED ORDER — ZIPRASIDONE HCL 20 MG PO CAPS
ORAL_CAPSULE | ORAL | Status: AC
  Filled 2015-07-06: qty 2

## 2015-07-06 MED ORDER — DICLOXACILLIN SODIUM 250 MG PO CAPS: 250.0000 mg | ORAL_CAPSULE | Freq: Three times a day (TID) | ORAL | Status: DC

## 2015-07-06 NOTE — ED Notes (Signed)
[facility] for [facility] thoughts with a plan and right knee. EMS reports patient was walking to his mothers house to hang himself when his right knee started hurting.
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
## 2015-07-06 NOTE — BH Assessment (Signed)
[facility] Assessment Progress Note  The following facilities have been contacted to seek placement for this pt, with results as noted:  Beds available, information sent, decision pending:  [facility]   [facility]  [facility]   Arlie Posch, [place] Triage Specialist [phone]

## 2015-07-06 NOTE — ED Provider Notes (Addendum)
CSN: [id number]     Arrival date & time 07/06/15  0019 History  By signing my name below, I, Tanda RockersMargaux Venter, attest that this documentation has been prepared under the direction and in the presence of Gilda Creasehristopher J Pollina, MD. Electronically Signed: Tanda RockersMargaux Venter, ED Scribe. 07/06/2015. 12:45 AM.  Chief Complaint  Patient presents with  . Suicidal  . Knee Pain   The history is provided by the patient. No language interpreter was used.     HPI Comments: Ryan Bartlett is a 19 y.o. male brought in by ambulance, with hx bipolar disorder and depression who presents to the Emergency Department complaining of suicidal ideation that worsened tonight. He reports that he had a plan of hanging himself tonight but did not go through with it. Pt has attempted to commit suicide in the past. The last time he was admitted to the hospital was approximately 1 month ago at [facility]. Pt denies anything changing recently but reports he does not believe he belongs on this earth and that he keeps messing things up in his life. He denies homicidal ideation or hallucinations.   Past Medical History  Diagnosis Date  . ADHD (attention deficit hyperactivity disorder)   . Unspecified episodic mood disorder   . Oppositional defiant disorder   . Bipolar disorder (HCC)   . Depression    Past Surgical History  Procedure Laterality Date  . No past surgeries     Family History  Problem Relation Age of Onset  . Depression Mother   . Diabetes Other   . Hyperlipidemia Other   . Hypertension Other    Social History  Substance Use Topics  . Smoking status: Current Every Day Smoker -- 0.50 packs/day for 0 years    Types: Cigarettes  . Smokeless tobacco: Never Used  . Alcohol Use: No     Comment: "I actually recently quit"    Review of Systems  Psychiatric/Behavioral: Positive for suicidal ideas. Negative for hallucinations.  All other systems reviewed and are negative.  Allergies  Peanut-containing  drug products and Lactose intolerance (gi)  Home Medications   Prior to Admission medications   Medication Sig Start Date End Date Taking? Authorizing Provider  acamprosate (CAMPRAL) 333 MG tablet Take 2 tablets (666 mg total) by mouth 3 (three) times daily with meals. 06/08/15   Adonis BrookSheila Agustin, NP  cyclobenzaprine (FLEXERIL) 5 MG tablet Take 1 tablet (5 mg total) by mouth 3 (three) times daily. 06/08/15   Adonis BrookSheila Agustin, NP  dicloxacillin (DYNAPEN) 250 MG capsule Take 1 capsule (250 mg total) by mouth every 8 (eight) hours. 06/08/15   Adonis BrookSheila Agustin, NP  divalproex (DEPAKOTE ER) 250 MG 24 hr tablet Take 5 tablets (1,250 mg total) by mouth at bedtime. 06/08/15   Adonis BrookSheila Agustin, NP  traZODone (DESYREL) 50 MG tablet Take 1 tablet (50 mg total) by mouth at bedtime. 06/08/15   Adonis BrookSheila Agustin, NP  ziprasidone (GEODON) 40 MG capsule Take 1 capsule (40 mg total) by mouth daily with breakfast. 06/08/15   Adonis BrookSheila Agustin, NP  ziprasidone (GEODON) 60 MG capsule Take 1 capsule (60 mg total) by mouth daily with supper. 06/08/15   Adonis BrookSheila Agustin, NP   Triage VItals: BP 143/88 mmHg  Pulse 63  Temp(Src) 97.8 F (36.6 C) (Oral)  Resp 16  Ht 5\' 9"  (1.753 m)  Wt 230 lb (104.327 kg)  BMI 33.95 kg/m2  SpO2 100%   Physical Exam  Constitutional: He is oriented to person, place, and time. He appears  well-developed and well-nourished. No distress.  HENT:  Head: Normocephalic and atraumatic.  Right Ear: Hearing normal.  Left Ear: Hearing normal.  Nose: Nose normal.  Mouth/Throat: Oropharynx is clear and moist and mucous membranes are normal.  Eyes: Conjunctivae and EOM are normal. Pupils are equal, round, and reactive to light.  Neck: Normal range of motion. Neck supple.  Cardiovascular: Regular rhythm, S1 normal and S2 normal.  Exam reveals no gallop and no friction rub.   No murmur heard. Pulmonary/Chest: Effort normal and breath sounds normal. No respiratory distress. He exhibits no tenderness.   Abdominal: Soft. Normal appearance and bowel sounds are normal. There is no hepatosplenomegaly. There is no tenderness. There is no rebound, no guarding, no tenderness at McBurney's point and negative Murphy's sign. No hernia.  Musculoskeletal: Normal range of motion.       Right knee: He exhibits normal range of motion, no swelling, no effusion, no ecchymosis, no deformity, no laceration and no erythema. Tenderness found.  Neurological: He is alert and oriented to person, place, and time. He has normal strength. No cranial nerve deficit or sensory deficit. Coordination normal. GCS eye subscore is 4. GCS verbal subscore is 5. GCS motor subscore is 6.  Skin: Skin is warm, dry and intact. No rash noted. No cyanosis.  Psychiatric: His speech is normal and behavior is normal. He exhibits a depressed mood. He expresses suicidal ideation. He expresses suicidal plans.  Nursing note and vitals reviewed.   ED Course  Procedures (including critical care time)  DIAGNOSTIC STUDIES: Oxygen Saturation is 100% on RA, normal by my interpretation.    COORDINATION OF CARE: 12:44 AM-Discussed treatment plan which includes CMP, EtOH, Salicylate level, Acetaminophen, CBC, and rapid drug screen with pt at bedside and pt agreed to plan.   Labs Review Labs Reviewed  COMPREHENSIVE METABOLIC PANEL  ETHANOL  SALICYLATE LEVEL  ACETAMINOPHEN LEVEL  CBC  URINE RAPID DRUG SCREEN, HOSP PERFORMED    Imaging Review No results found. I have personally reviewed and evaluated these lab results as part of my medical decision-making.   EKG Interpretation None      MDM   Final diagnoses:  Depression  Suicidal ideation   Patient presents to the ER for evaluation of depression and suicidal ideation. Patient does report a long history of depression. Patient is suicidal currently. He reports that he had a plan to hang himself earlier today. He has previous suicide attempts and hospitalizations for depression. He is  on multiple medications, reports that he is taking them but he has not experienced any improvement. He will require psychiatric evaluation for further treatment.  Complaining of right knee pain. 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 There was no direct injury. X-ray is not necessary. Examination is unremarkable, no effusion, warmth, redness or swelling to suggest infection.  I personally performed the services described in this documentation, which was scribed in my presence. The recorded information has been reviewed and is accurate.      Gilda Crease, MD 07/06/15 1610  Gilda Crease, MD 07/06/15 [phone]

## 2015-07-06 NOTE — ED Notes (Signed)
Patient has stayed in his room most of the day.  Has been quiet and cooperative.  Denies thoughts of suicide at this time.  Denies auditory or visual hallucinations.  Awaiting placement.  Patient is agreeable to admission.

## 2015-07-06 NOTE — Clinical Social Work Note (Signed)
Patient has care coordinator, Kyra LeylandJennifer Gates [phone]).  Santa GeneraAnne Meiko Ives, [name] Lead Clinical Social Worker Phone:  [phone]
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
## 2015-07-06 NOTE — ED Notes (Signed)
This is a 19 years old African American male admitted due to suicide thoughts. Patient reported that he went out walking last night and started having suicide thoughts with plans to hang himself. Patient reported that he had 3 prior history of suicide attempts by overdose and hanging. He reported ongoing feelings of; " I feel I will be better off dead and feeling like I'm a burden to people here and I no longer want to be a burden to anyone". Patient reported that he is unemployed and not in School. He endorsed suicide thoughts but verbally contracted for safety. He denied Hallucinations. Writer encouraged and supported patient. Q 15 minute check initiated.

## 2015-07-07 NOTE — ED Notes (Signed)
Pt. Noted in room. No complaints or concerns voiced. No distress or abnormal behavior noted. Will continue to monitor with security cameras. Q 15 minute rounds continue. 

## 2015-07-07 NOTE — Progress Notes (Signed)
Pt stayed in his room for most of this shift. Denied AVH, HI and pain. Pt endorsed SI with plan to hang self when assessed, verbally contracts for safety. Pt encouraged to shower / perform his ADLs this shift but to no avail. Compliant with medications as ordered. Support and availability offered. Pt remain safe on unit without self injurious behavior or outburst to note thus far. Q 15 minutes checks maintained.

## 2015-07-07 NOTE — ED Notes (Addendum)
Pt. Noted in room. No complaints or concerns voiced. No distress or abnormal behavior noted. Will continue to monitor with security cameras. Q 15 minute rounds continue. 

## 2015-07-07 NOTE — BH Assessment (Signed)
Reassessment 06/27/2015:  Patient has been having thoughts of killing himself. Yesterday he was thinking about wanting to kill himself by hanging. He was going to find rope at home and hang himself. However his right knee was hurting so he called EMS to bring him to [facility]. Writer met with patient today to evaluate his symptoms. Patient sts he feels better but continues to feel suicidal. He continues to have a plan to end his life by hanging self. He denies HI. He denies AVH's. Patient reports sleeping well. His appetite is fair. Patient is calm and cooperative.

## 2015-07-08 NOTE — ED Notes (Signed)
Patient has a flat affect on approach this am. Reports not sleeping well during the night. States mood has been really depressed and still reports having SI at this time. Patient has a poor appetite this am. Just laying around at present. Took am medications without issues (Geodon and Campral). Some upper back pain but says he will rest at this time. Cooperative with staff thus far today.

## 2015-07-08 NOTE — BH Assessment (Signed)
[facility] Assessment Progress Note  The following facilities have been contacted to seek placement for this pt, with results as noted:  Beds available, information sent, decision pending:  [facility]   [facility]  [facility]  Ryan [place], [place] Triage Specialist [phone]

## 2015-07-08 NOTE — BH Assessment (Signed)
Patient was reassessed by TTS.  Patient states that he came into the hospital due to suicidal thoughts. Patient states that he is currently suicidal with a plan to hang himself. Patient states "I just feel like if I wasn't here on this earth it would be better for everybody and I wouldn't be a burden and people wouldn't worry about me."  Patient denies HI and AVH at this time. Patient does not appear to be responding to internal stimuli.   Patient was laying in his bed facing the wall when this writer approached the room. He turned his head around to look at this writer and answered questions appropriately.  Patient does not appear to be responding to internal stimuli. Patient continues to meet inpatient criteria per Dr. Jannifer FranklinAkintayo on the 300 hall. TTS to seek placement.    Ryan PokeJoVea Haddie Bruhl, [name] Therapeutic Triage Specialist  Health 07/08/2015 10:11 AM

## 2015-07-08 NOTE — Progress Notes (Signed)
D Pt. Continues to report passive SI,  Denies HI and A or VH.Pt. Denies pain or discomfort.  A Writer offered support and encouragement.  R Pt. Reports he hopes to return to live with his Mother but feels he needs inpatient treatment. Pt. Does contract for safety. Pt. Remains safe on the unit.

## 2015-07-09 ENCOUNTER — Encounter (HOSPITAL_COMMUNITY): Payer: Self-pay

## 2015-07-09 ENCOUNTER — Inpatient Hospital Stay (HOSPITAL_COMMUNITY)
Admission: EM | Admit: 2015-07-09 | Discharge: 2015-07-20 | DRG: 885 | Disposition: A | Discharge status: Home / Self Care | Payer: Medicaid Other | Source: Intra-hospital | Attending: Psychiatry | Admitting: Psychiatry

## 2015-07-09 DIAGNOSIS — R45851 Suicidal ideations: Secondary | ICD-10-CM | POA: Diagnosis present

## 2015-07-09 DIAGNOSIS — F902 Attention-deficit hyperactivity disorder, combined type: Secondary | ICD-10-CM | POA: Diagnosis present

## 2015-07-09 DIAGNOSIS — F331 Major depressive disorder, recurrent, moderate: Secondary | ICD-10-CM | POA: Diagnosis present

## 2015-07-09 DIAGNOSIS — Z590 Homelessness: Secondary | ICD-10-CM | POA: Diagnosis not present

## 2015-07-09 DIAGNOSIS — F17210 Nicotine dependence, cigarettes, uncomplicated: Secondary | ICD-10-CM | POA: Diagnosis present

## 2015-07-09 DIAGNOSIS — F1020 Alcohol dependence, uncomplicated: Secondary | ICD-10-CM | POA: Diagnosis present

## 2015-07-09 MED ORDER — TRAZODONE HCL 100 MG PO TABS
100.0000 mg | ORAL_TABLET | Freq: Every day | ORAL | Status: DC
  Administered 2015-07-09: 100 mg via ORAL
  Filled 2015-07-09 (×4): qty 1

## 2015-07-09 MED ORDER — TRAZODONE HCL 50 MG PO TABS
50.0000 mg | ORAL_TABLET | Freq: Every day | ORAL | Status: DC
  Filled 2015-07-09: qty 1

## 2015-07-09 MED ORDER — MAGNESIUM HYDROXIDE 400 MG/5ML PO SUSP: 30.0000 mL | Freq: Every day | ORAL | Status: DC | PRN

## 2015-07-09 MED ORDER — SERTRALINE HCL 50 MG PO TABS
50.0000 mg | ORAL_TABLET | Freq: Every day | ORAL | Status: DC
Start: 2015-07-09 — End: 2015-07-12
  Administered 2015-07-09 – 2015-07-12 (×4): 50 mg via ORAL
  Filled 2015-07-09 (×5): qty 1

## 2015-07-09 MED ORDER — DIVALPROEX SODIUM ER 500 MG PO TB24
1250.0000 mg | ORAL_TABLET | Freq: Every day | ORAL | Status: DC
  Administered 2015-07-09 – 2015-07-17 (×9): 1250 mg via ORAL
  Filled 2015-07-09 (×10): qty 1

## 2015-07-09 MED ORDER — NICOTINE POLACRILEX 2 MG MT GUM
2.0000 mg | CHEWING_GUM | OROMUCOSAL | Status: DC | PRN
  Administered 2015-07-09 – 2015-07-17 (×17): 2 mg via ORAL
  Filled 2015-07-09 (×4): qty 1

## 2015-07-09 MED ORDER — ACETAMINOPHEN 325 MG PO TABS
650.0000 mg | ORAL_TABLET | Freq: Four times a day (QID) | ORAL | Status: DC | PRN
  Administered 2015-07-10 – 2015-07-20 (×4): 650 mg via ORAL
  Filled 2015-07-09 (×4): qty 2

## 2015-07-09 MED ORDER — ALUM & MAG HYDROXIDE-SIMETH 200-200-20 MG/5ML PO SUSP
30.0000 mL | ORAL | Status: DC | PRN
  Administered 2015-07-12: 30 mL via ORAL
  Filled 2015-07-09: qty 30

## 2015-07-09 NOTE — H&P (Signed)
Psychiatric Admission Assessment Adult  Patient Identification: Ryan Bartlett T Heist MRN:  [id number] Date of Evaluation:  07/09/2015 Chief Complaint:   " I felt really suicidal" Principal Diagnosis: Major depressive disorder, recurrent episode, moderate (HCC) Diagnosis:   Patient Active Problem List   Diagnosis Date Noted  . Major depressive disorder, recurrent episode, moderate (HCC) [F33.1] 07/09/2015  . Depression [F32.9]   . Suicidal ideation [R45.851]   . Cannabis use disorder, moderate, dependence (HCC) [F12.20] 06/06/2015  . Alcohol use disorder, moderate, dependence (HCC) [F10.20] 06/06/2015  . Bipolar I disorder, most recent episode depressed (HCC) [F31.30] 05/23/2013  . Cannabis abuse [F12.10] 03/30/2012  . ADHD (attention deficit hyperactivity disorder), combined type [F90.2] 08/28/2011  . Conduct disorder, adolescent onset type [F91.2] 08/28/2011   History of Present Illness:: 19 year old male, known to our unit from prior psychiatric admission from 10/12- 06/09/15.  At that time had presented with depression in the context of homelessness and substance abuse. He states " I was doing OK for a while", but states he started feeling very depressed again, but cannot identify and specific stressors/ issues that may have contributed . States he had continued to take his prescribed medications ( Depakote ER/ Geodon), but had stopped Geodon 2 weeks ago,  Because he felt it was causing him to have migraines . Of note, states he has been completely sober/abstinent since his last admission, so does not feel substance abuse has been a factor in recent increased symptoms of depression. States that he started developing passive suicidal ideations at first, but recently had started thinking of " hanging myself , so I knew I had to come to hospital". Associated Signs/Symptoms: Depression Symptoms:  depressed mood, anhedonia, insomnia, recurrent thoughts of death, suicidal thoughts with  specific plan, loss of energy/fatigue, decreased appetite, decreased sense of self esteem (Hypo) Manic Symptoms:  Does not endorse  Anxiety Symptoms:  States he feels he is worrying exessively Psychotic Symptoms:   Denies  PTSD Symptoms:  denies  Total Time spent with patient: 45 minutes   Past Psychiatric History:  Several psychiatric admissions starting in adolescence. Most recent admission was to our unit 05/31/15- 06/09/15. Has been  Diagnosed with Bipolar Disorder , and describes some brief episodes of increased agitation, energy, but mostly describes impulsivity, explosiveness, short term mood swings. Does describe periods of severe depression.  (+) history of suicide attempts in the past, states he tried to hang self and to overdose . Last attempt was a few months ago. Denies history of psychosis. Most recently had been treated with Depakote ER , Geodon, but recently stopped Geodon. He is connected to [facility].  Risk to Self: Is patient at risk for suicide?: Yes Risk to Others:   Prior Inpatient Therapy:   Prior Outpatient Therapy:    Alcohol Screening: 1. How often do you have a drink containing alcohol?: Monthly or less (None in the last 30 days) 2. How many drinks containing alcohol do you have on a typical day when you are drinking?: 1 or 2 3. How often do you have six or more drinks on one occasion?: Never Preliminary Score: 0 4. How often during the last year have you found that you were not able to stop drinking once you had started?: Never 5. How often during the last year have you failed to do what was normally expected from you becasue of drinking?: Never 6. How often during the last year have you needed a first drink in the morning  to get yourself going after a heavy drinking session?: Never 7. How often during the last year have you had a feeling of guilt of remorse after drinking?: Never 8. How often during the last year have you been unable to  remember what happened the night before because you had been drinking?: Never 9. Have you or someone else been injured as a result of your drinking?: No 10. Has a relative or friend or a doctor or another health worker been concerned about your drinking or suggested you cut down?: No Alcohol Use Disorder Identification Test Final Score (AUDIT): 1 Brief Intervention: AUDIT score less than 7 or less-screening does not suggest unhealthy drinking-brief intervention not indicated Substance Abuse History in the last 12 months:  States he has been abstinent from drugs ( alcohol and cannabis had been substances of choice ) for several weeks . Consequences of Substance Abuse: Denies  Previous Psychotropic Medications: most recent medications have been Depakote ER and Geodon- as noted , has not been taking Geodon in several days to weeks . Psychological Evaluations: no  Past Medical History:  States he has been told he is " pre- diabetic", and states he has chronic knee pain. Smokes 1/2 ppd . Past Medical History  Diagnosis Date  . ADHD (attention deficit hyperactivity disorder)   . Unspecified episodic mood disorder   . Oppositional defiant disorder   . Bipolar disorder (HCC)   . Depression     Past Surgical History  Procedure Laterality Date  . No past surgeries     Family History:  Parents alive, separated , no relationship with father, currently living with mother . Has one brother and two sisters  Family History  Problem Relation Age of Onset  . Depression Mother   . Diabetes Other   . Hyperlipidemia Other   . Hypertension Other    Family Psychiatric  History: Denies history of mental illness in family, denies any history of suicides in family, father has history of alcohol dependence- brother has history of ADD. Social History:  Single, no children, no SO at this time , living with mother- states he attributes his sobriety over recent weeks to living with mother, being in a more  structured setting, prior to living with mother had been living in group homes. Currently unemployed, states he is on probation . History  Alcohol Use No    Comment: "I actually recently quit"     History  Drug Use No    Social History   Social History  . Marital Status: Single    Spouse Name: N/A  . Number of Children: N/A  . Years of Education: N/A   Occupational History  . student     12th grade at [employer]   Social History Main Topics  . Smoking status: Current Every Day Smoker -- 0.50 packs/day for 0 years    Types: Cigarettes  . Smokeless tobacco: Never Used  . Alcohol Use: No     Comment: "I actually recently quit"  . Drug Use: No  . Sexual Activity: Not Asked     Comment: Pt reports that he is not sexually active   Other Topics Concern  . None   Social History Narrative   Additional Social History:    Pain Medications: None Prescriptions: See PTA medication list Over the Counter: None History of alcohol / drug use?: No history of alcohol / drug abuse Longest period of sobriety (when/how long): over a month Negative Consequences of Use: Legal  Name of Substance 1: K-2 1 - Age of First Use: 19 years of age 13 - Amount (size/oz): Joint per day 1 - Frequency: 5 to 6 times in a week 1 - Duration: <1 year 1 - Last Use / Amount: A week ago.  Allergies:   Allergies  Allergen Reactions  . Peanut-Containing Drug Products Anaphylaxis  . Lactose Intolerance (Gi) Diarrhea and Nausea And Vomiting   Lab Results: No results found for this or any previous visit (from the past 48 hour(s)).  Metabolic Disorder Labs:  Lab Results  Component Value Date   HGBA1C 5.8* 06/02/2015   MPG 120 06/02/2015   MPG 126* 05/14/2012   No results found for: PROLACTIN Lab Results  Component Value Date   CHOL 199 06/02/2015   TRIG 390* 06/02/2015   HDL 31* 06/02/2015   CHOLHDL 6.4 06/02/2015   VLDL 78* 06/02/2015   LDLCALC 90 06/02/2015   LDLCALC 118* 05/14/2012     Current Medications: Current Facility-Administered Medications  Medication Dose Route Frequency Provider Last Rate Last Dose  . acetaminophen (TYLENOL) tablet 650 mg  650 mg Oral Q6H PRN Worthy Flank, NP      . alum & mag hydroxide-simeth (MAALOX/MYLANTA) 200-200-20 MG/5ML suspension 30 mL  30 mL Oral Q4H PRN Worthy Flank, NP      . divalproex (DEPAKOTE ER) 24 hr tablet 1,250 mg  1,250 mg Oral QHS Worthy Flank, NP      . magnesium hydroxide (MILK OF MAGNESIA) suspension 30 mL  30 mL Oral Daily PRN Worthy Flank, NP      . nicotine polacrilex (NICORETTE) gum 2 mg  2 mg Oral PRN Craige Cotta, MD   2 mg at 07/09/15 0738  . traZODone (DESYREL) tablet 50 mg  50 mg Oral QHS Worthy Flank, NP       PTA Medications: Prescriptions prior to admission  Medication Sig Dispense Refill Last Dose  . divalproex (DEPAKOTE ER) 250 MG 24 hr tablet Take 5 tablets (1,250 mg total) by mouth at bedtime. 150 tablet 0 07/08/2015 at Unknown time  . traZODone (DESYREL) 50 MG tablet Take 1 tablet (50 mg total) by mouth at bedtime. 30 tablet 0 07/08/2015 at Unknown time  . cyclobenzaprine (FLEXERIL) 5 MG tablet Take 1 tablet (5 mg total) by mouth 3 (three) times daily. (Patient not taking: Reported on 07/06/2015) 30 tablet 0 Unknown at Unknown time    Musculoskeletal: Strength & Muscle Tone: within normal limits Gait & Station: normal Patient leans: N/A  Psychiatric Specialty Exam: Physical Exam  Review of Systems  Constitutional: Negative.   Eyes: Negative.   Respiratory: Negative.   Cardiovascular: Negative.   Gastrointestinal: Negative.   Genitourinary: Negative.   Musculoskeletal: Positive for joint pain.       R knee pain   Skin: Negative.   Neurological: Positive for headaches. Negative for seizures.  Endo/Heme/Allergies: Negative.   Psychiatric/Behavioral: Positive for depression and suicidal ideas.  All other systems reviewed and are negative.   Blood pressure 119/97, pulse  75, temperature 97.9 F (36.6 C), temperature source Oral, resp. rate 20, height 5\' 9"  (1.753 m), weight 231 lb (104.781 kg).Body mass index is 34.1 kg/(m^2).  General Appearance: Fairly Groomed  Patent attorney::  Good  Speech:  Normal Rate  Volume:  Normal  Mood:  Depressed  Affect:  mildly constricted but reactive, does smile at times   Thought Process:  Linear  Orientation:  Full (Time, Place, and Person)  Thought Content:  denies hallucinations, no delusions  Suicidal Thoughts:  Yes.  without intent/plan denies any current thoughts of hurting self or any suicidal ideations at this time and contracts for safety on unit   Homicidal Thoughts:  No  Memory:  recent and remote grossly intact   Judgement:  Fair  Insight:  Fair  Psychomotor Activity:  Normal  Concentration:  Good  Recall:  Good  Fund of Knowledge:Good  Language: Good  Akathisia:  Negative  Handed:  Left  AIMS (if indicated):     Assets:  Communication Skills Desire for Improvement Resilience  ADL's:  Intact  Cognition: WNL  Sleep:  Number of Hours: 6     Treatment Plan Summary: Daily contact with patient to assess and evaluate symptoms and progress in treatment, Medication management, Plan inpatient treatment  and medications as below   Observation Level/Precautions:  15 minute checks  Laboratory:  as needed - Valproic Acid Serum Level  Psychotherapy:  Milieu, groups   Medications:   Continue Depakote ER 1250 mgrs QHS  - patient states this medication is helping , and well tolerated  -he does not want to restart Geodon, as feels that this medication causes headaches. We discussed options Agrees to Zoloft 50 mgrs QDAY for depression and anxiety.   Consultations:  As needed   Discharge Concerns:  -  Estimated LOS: 5-6 days   Other:     I certify that inpatient services furnished can reasonably be expected to improve the patient's condition.   Arnetta Odeh 11/19/201610:01 AM
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
## 2015-07-09 NOTE — Progress Notes (Signed)
Ryan Bartlett is seen out in the milieu...hanging around at the main nursing desk, talking with staff ( especially Geographical information systems officernursing secretary) and is seen laughing and joking most of the day. HE says he is " glad to be back" here. He completed his daily assessment first thing this morning and on it he wrote he has had suicidal thoughts today and he quickly contracts to " not hurt myself" . He rates his depression, hopelessness and anxiety " 8/9/7", respectively.   A he is attentive in his nursing Life SKills group this afternoon. HE willingly shares personal feelings and experiences, as they relate to group discussion and he is appreciate when his peers offer him alternative  coping skills.    R Safety is in place and poc cont.

## 2015-07-09 NOTE — Progress Notes (Signed)
D Pt. Continues to endorse passive SI, denies HI, no complaints of pain or discomfort noted at present time.  A Writer offered support and encouragement, [facility]   R  Pt. Is pleasant, calm and cooperative,. [facility].  Pt. Remains safe on the unit.

## 2015-07-09 NOTE — Progress Notes (Signed)
Adult Psychoeducational Group Note  Date:  07/09/2015 Time:  1045  Group Topic/Focus:  Orientation:   The focus of this group is to educate the patient on the purpose and policies of crisis stabilization and provide a format to answer questions about their admission.  The group details unit policies and expectations of patients while admitted.  Participation Level:  Active  Participation Quality:  Appropriate  Affect:  Appropriate  Cognitive:  Appropriate  Insight: Appropriate  Engagement in Group:  Engaged  Modes of Intervention:  Discussion and Education  Additional Comments:    Avonell Lenig L 07/09/2015, 1:20 PM

## 2015-07-09 NOTE — BH Assessment (Signed)
[facility]. Informed Dr. Elesa MassedWard of the disposition.

## 2015-07-09 NOTE — Progress Notes (Signed)
Adult Psychoeducational Group Note  Date:  07/09/2015 Time:  9:28 PM  Group Topic/Focus:  Wrap-Up Group:   The focus of this group is to help patients review their daily goal of treatment and discuss progress on daily workbooks.  Participation Level:  Active  Participation Quality:  Appropriate  Affect:  Appropriate  Cognitive:  Alert  Insight: Appropriate  Engagement in Group:  Engaged  Modes of Intervention:  Discussion  Additional Comments:  Pt stated that he had a good day.   Kaleen OdeaCOOKE, Kyan Yurkovich R 07/09/2015, 9:28 PM

## 2015-07-09 NOTE — Progress Notes (Signed)
Admission Note  Pt, a 19 y/o [facility]. At the time of admission, Pt denies depression, anxiety, pain, SI, HI and AVH. Pt however admitted to h/o increased depression, SI, substance abuse and excessive anger. He states, "I feel fine, now that I am here; I have been very depressed for a long time and I just wanted it to stop; I also get extremely angry for little things, maybe I smoke too much of those things." Pt also denies alcohol use. He states, "I am proud of myself; I have stopped drinking for more than a month now." Pt remained calm, cooperative and appropriate throughout the admission process. Support, encouragement, and safe environment provided.  15-minute safety checks was initiated and continued for patient's safety.
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
## 2015-07-09 NOTE — ED Provider Notes (Signed)
12:30 AM  [facility] Jama Flavorsobos.  Ryan MawKristen N Timoth Schara, DO 07/09/15 [phone]

## 2015-07-09 NOTE — [facility] Group Notes (Signed)
[facility] Group Notes:  (Clinical Social Work)  07/09/2015     1:15-2:15PM  Summary of Progress/Problems:   The main focus of today's process group was to learn how to use a decisional balance exercise to move forward in the Stages of Change, which were described and discussed.  Patients listed needs on the whiteboard and unhealthy coping techniques often used to fill needs.  Motivational Interviewing and the whiteboard were utilized to help patients explore in depth the perceived benefits and costs of unhealthy coping techniques, as well as the  benefits and costs of replacing that with a healthy coping skills.  A handout was distributed for patients to be able to do this exercise for themselves.   The patient expressed that their own unhealthy coping involves getting drunk or smoking marijuana, which he states he no longer does.  He walks his dogs for a healthy coping technique.  He did not want to participate in group, left numerous times and made it obvious he was bored, tried to sleep while in the room.  Type of Therapy:  Group Therapy - Process   Participation Level:  Minimal  Participation Quality:  Resistant  Affect:  Resistant  Cognitive:  Oriented  Insight:  Poor  Engagement in Therapy:  Poor  Modes of Intervention:  Education, Motivational Interviewing  Ambrose MantleMareida Grossman-Orr, [name] 07/09/2015, 4:15 PM

## 2015-07-09 NOTE — [facility] Suicide Risk Assessment (Signed)
[facility] INPATIENT:  Family/Significant Other Suicide Prevention Education  Suicide Prevention Education:  Patient Refusal for Family/Significant Other Suicide Prevention Education: The patient Ryan Bartlett has refused to provide written consent for family/significant other to be provided Family/Significant Other Suicide Prevention Education during admission and/or prior to discharge.  Physician notified.  Sarina SerGrossman-Orr, Orabelle Rylee Jo 07/09/2015, 12:48 PM

## 2015-07-09 NOTE — Tx Team (Signed)
Initial Interdisciplinary Treatment Plan   PATIENT STRESSORS: Financial difficulties Legal issue Substance abuse   PATIENT STRENGTHS: Barrister's clerkCommunication skills Motivation for treatment/growth Physical Health   PROBLEM LIST: Problem List/Patient Goals Date to be addressed Date deferred Reason deferred Estimated date of resolution  "Work on my anger" 07/09/15     "Work on my coping skills" 07/09/15     Risk for suicide 07/09/15     Depression 07/09/15     Anxiety 07/09/15     Substance abuse 07/09/15                        DISCHARGE CRITERIA:  Ability to meet basic life and health needs Adequate post-discharge living arrangements Verbal commitment to aftercare and medication compliance  PRELIMINARY DISCHARGE PLAN: Return to previous living arrangement  PATIENT/FAMIILY INVOLVEMENT: This treatment plan has been presented to and reviewed with the patient, Maureen Chattersustin T Cuccia, and/or family member.  The patient and family have been given the opportunity to ask questions and make suggestions.  Rubens Cranston T Lathaniel Legate 07/09/2015, 3:19 AM

## 2015-07-10 MED ORDER — TRAZODONE HCL 50 MG PO TABS
50.0000 mg | ORAL_TABLET | Freq: Every day | ORAL | Status: DC
  Administered 2015-07-10 – 2015-07-11 (×2): 50 mg via ORAL
  Filled 2015-07-10 (×4): qty 1

## 2015-07-10 MED ORDER — ALBUTEROL SULFATE (2.5 MG/3ML) 0.083% IN NEBU: 2.5000 mg | INHALATION_SOLUTION | Freq: Four times a day (QID) | RESPIRATORY_TRACT | Status: DC | PRN

## 2015-07-10 NOTE — Progress Notes (Signed)
D. Pt had been up and visible in milieu, did attend and participate in evening group activity. Pt spoke of his day and said it was ok, pt spoke about how things were going in his life and spoke about how he wasn't taking his medications as prescribed. Pt did receive medications without incident this evening and did not verbalize any complaints of pain. A. Support and encouragement provided. R. Safety maintained, will continue to monitor.

## 2015-07-10 NOTE — Progress Notes (Signed)
D Pt is seen OOB UAL on the  500 hall tolerated well.  He remains superficial about his specific problems and is well adept at deflecting this writers questions..in order to prevent speaking about his true feelings. He attends his groups and  Pays attention to the discussion in Life SKills . He says to this Clinical research associatewriter " I learn something every time I come here".    A HE completed his daily assessment and on it he wrote  He has had suicidal ideation today, but he is quickly able to to say " I won't hurt myself Miss...... ". He rates his depression, hopelessness and anxiety " 5/0/6", respectively.   R Safety is in place.

## 2015-07-10 NOTE — [facility] Group Notes (Signed)
[facility] Group Notes:  (Clinical Social Work)  07/10/2015  1:15-2:15pm  Summary of Progress/Problems:   The main focus of today's process group was to   1)  discuss the importance of adding supports  2)  define health supports versus unhealthy supports  3)  identify the patient's current unhealthy supports and plan how to handle them  4)  Identify the patient's current healthy supports and plan what to add.  An emphasis was placed on using counselor, doctor, therapy groups, 12-step groups, and problem-specific support groups to expand supports.    The patient expressed full comprehension of the concepts presented, and agreed that there is a need to add more supports.  The patient took a large portion of group in talking about his personal situation, and had to be redirected at times.  He was very interactive with CSW and with other patients throughout all of group time.  Type of Therapy:  Process Group with Motivational Interviewing  Participation Level:  Active  Participation Quality:  Attentive, Sharing and Supportive  Affect:  Blunted and Irritable  Cognitive:  Appropriate and Oriented  Insight:  Developing/Improving  Engagement in Therapy:  Engaged  Modes of Intervention:   Education, Support and Processing, Activity  Ambrose MantleMareida Grossman-Orr, [name] 07/10/2015

## 2015-07-10 NOTE — Progress Notes (Signed)
Adult Psychoeducational Group Note  Date:  07/10/2015 Time:  8:25 PM  Group Topic/Focus:  Wrap-Up Group:   The focus of this group is to help patients review their daily goal of treatment and discuss progress on daily workbooks.  Participation Level:  Active  Participation Quality:  Appropriate  Affect:  Appropriate  Cognitive:  Appropriate  Insight: Good  Engagement in Group:  Engaged  Modes of Intervention:  Discussion  Additional Comments:  Pt rated overall day a 7 out of 10. Pt reported that he achieved his goal for the day which was to improve his negative mentality. Pt noted that going to the gym was the highlight of his day.   Cleotilde NeerJasmine S Kelty Szafran 07/10/2015, 8:37 PM

## 2015-07-10 NOTE — Progress Notes (Signed)
Adult Psychoeducational Group Note  Date:  07/10/2015 Time:  1045  Group Topic/Focus:  Making Healthy Choices:   The focus of this group is to help patients identify negative/unhealthy choices they were using prior to admission and identify positive/healthier coping strategies to replace them upon discharge.  Participation Level:  Active  Participation Quality:  Appropriate  Affect:  Appropriate  Cognitive:  Appropriate  Insight: Appropriate  Engagement in Group:  Engaged  Modes of Intervention:  Discussion and Education  Additional Comments:    Hersh Minney L 07/10/2015, 1:24 PM

## 2015-07-10 NOTE — Progress Notes (Signed)
[facility] MD Progress Note  07/10/2015 4:43 PM Ryan Bartlett  MRN:  [id number] Subjective:  Patient reports partial improvement . Still feels depressed , but acknowledges that he is starting to feel better, which he attributes partly to supportive milieu. Denies medication side effects. Objective : I have reviewed chart notes and have met with patient. Patient reports ongoing depression , but states he is feeling better than on admission. Behavior on unit has remained appropriate, cooperative, has not exhibited any explosiveness or angry outbursts. At this time denies medication side effects. He does state that current Trazodone dose ( 100 mgrs QHS ) is excessive and causing some AM sedation . Does not want to stop Trazodone as feels it helps, but does want to taper dose down . As per staff, notes, he has been visible in milieu, interactive with peers, noted to be laughing/joking with peers . Patient requests inhaler ( pro-air) which he states he uses for wheezing triggered by exercise .  Patient reports some depression regarding difficulties in getting a GED , as a needed step to be able to go to college and have a better financial/employment future . Responds well to empathy, encouragement .  Principal Problem: Major depressive disorder, recurrent episode, moderate ([place]) Diagnosis:   Patient Active Problem List   Diagnosis Date Noted  . Major depressive disorder, recurrent episode, moderate ([place]) [F33.1] 07/09/2015  . Depression [F32.9]   . Suicidal ideation [R45.851]   . Cannabis use disorder, moderate, dependence ([place]) [F12.20] 06/06/2015  . Alcohol use disorder, moderate, dependence ([place]) [F10.20] 06/06/2015  . Bipolar I disorder, most recent episode depressed ([place]) [F31.30] 05/23/2013  . Cannabis abuse [F12.10] 03/30/2012  . ADHD (attention deficit hyperactivity disorder), combined type [F90.2] 08/28/2011  . Conduct disorder, adolescent onset type [F91.2] 08/28/2011   Total Time spent  with patient: 20 minutes    Past Medical History:  Past Medical History  Diagnosis Date  . ADHD (attention deficit hyperactivity disorder)   . Unspecified episodic mood disorder   . Oppositional defiant disorder   . Bipolar disorder ([place])   . Depression     Past Surgical History  Procedure Laterality Date  . No past surgeries     Family History:  Family History  Problem Relation Age of Onset  . Depression Mother   . Diabetes Other   . Hyperlipidemia Other   . Hypertension Other     Social History:  History  Alcohol Use No    Comment: "I actually recently quit"     History  Drug Use No    Social History   Social History  . Marital Status: Single    Spouse Name: N/A  . Number of Children: N/A  . Years of Education: N/A   Occupational History  . student     12th grade at [employer]  . Smoking status: Current Every Day Smoker -- 0.50 packs/day for 0 years    Types: Cigarettes  . Smokeless tobacco: Never Used  . Alcohol Use: No     Comment: "I actually recently quit"  . Drug Use: No  . Sexual Activity: Not Asked     Comment: Pt reports that he is not sexually active   Other Topics Concern  . None   Social History Narrative   Additional Social History:    Pain Medications: None Prescriptions: See PTA medication list Over the Counter: None History of alcohol / drug use?: No history of alcohol / drug  abuse Longest period of sobriety (when/how long): over a month Negative Consequences of Use: Legal Name of Substance 1: K-2 1 - Age of First Use: 19 years of age 52 - Amount (size/oz): Joint per day 1 - Frequency: 5 to 6 times in a week 1 - Duration: <1 year 1 - Last Use / Amount: A week ago.  Sleep:  Improved   Appetite: good   Current Medications: Current Facility-Administered Medications  Medication Dose Route Frequency Provider Last Rate Last Dose  . acetaminophen (TYLENOL) tablet 650 mg  650 mg Oral Q6H PRN Harriet Butte, NP   650 mg at 07/10/15 0853  . alum & mag hydroxide-simeth (MAALOX/MYLANTA) 200-200-20 MG/5ML suspension 30 mL  30 mL Oral Q4H PRN Harriet Butte, NP      . divalproex (DEPAKOTE ER) 24 hr tablet 1,250 mg  1,250 mg Oral QHS Harriet Butte, NP   1,250 mg at 07/09/15 2229  . magnesium hydroxide (MILK OF MAGNESIA) suspension 30 mL  30 mL Oral Daily PRN Harriet Butte, NP      . nicotine polacrilex (NICORETTE) gum 2 mg  2 mg Oral PRN Jenne Campus, MD   2 mg at 07/10/15 1302  . sertraline (ZOLOFT) tablet 50 mg  50 mg Oral Daily Jenne Campus, MD   50 mg at 07/10/15 0818  . traZODone (DESYREL) tablet 50 mg  50 mg Oral QHS Jenne Campus, MD        Lab Results: No results found for this or any previous visit (from the past 48 hour(s)).  Physical Findings: AIMS: Facial and Oral Movements Muscles of Facial Expression: None, normal Lips and Perioral Area: None, normal Jaw: None, normal Tongue: None, normal,Extremity Movements Upper (arms, wrists, hands, fingers): None, normal Lower (legs, knees, ankles, toes): None, normal, Trunk Movements Neck, shoulders, hips: None, normal, Overall Severity Severity of abnormal movements (highest score from questions above): None, normal Incapacitation due to abnormal movements: None, normal Patient's awareness of abnormal movements (rate only patient's report): No Awareness, Dental Status Current problems with teeth and/or dentures?: No Does patient usually wear dentures?: No  CIWA:  CIWA-Ar Total: 1 COWS:  COWS Total Score: 4  Musculoskeletal: Strength & Muscle Tone: within normal limits Gait & Station: normal Patient leans: N/A  Psychiatric Specialty Exam: ROS denies headache, denies chest pain, describes some wheezing triggered by exercise , denies vomiting   Blood pressure 130/84, pulse 83, temperature 97.8 F (36.6 C), temperature source Oral, resp. rate 16, height _0  (1.753 m), weight 231 lb (104.781 kg).Body mass index is  34.1 kg/(m^2).  General Appearance: Fairly Groomed  Engineer, water::  Good  Speech:  Normal Rate  Volume:  Normal  Mood:  reports he still feels depressed but acknowledges improvement   Affect:  Appropriate and more reactive   Thought Process:  Linear  Orientation:  Full (Time, Place, and Person)  Thought Content:  denies hallucinations, no delusions   Suicidal Thoughts:  No at this time denies any suicidal ideations or self injurious ideations  Homicidal Thoughts:  No  Memory:  recent and remote grossly intact   Judgement:  Other:  improving   Insight:  improving   Psychomotor Activity:  Normal  Concentration:  Good  Recall:  Good  Fund of Knowledge:Good  Language: Good  Akathisia:  Negative  Handed:  Left  AIMS (if indicated):     Assets:  Communication Skills Desire for Improvement Resilience  ADL's:  Intact  Cognition: WNL  Sleep:  Number of Hours: 6  Assessment - patient presents euthymic, with a full range of affect. Interactive with peers, pleasant on approach, no irritability or explosiveness. Does report some residual and ongoing depression and ruminates about psychosocial stressors, such as needing to get GED in order to go to college. No SI. Tolerating medications well at this time and does not endorse side effects, except for feeling somewhat sedated in AM after taking Trazodone at 100 mgrs QHS Treatment Plan Summary: Daily contact with patient to assess and evaluate symptoms and progress in treatment, Medication management, Plan inpatient treatment and medications as below  Encourage ongoing milieu participation to work on coping skills and symptom reduction Continue Zoloft 50 mgrs QDAY for depression Continue Depakote ER 1250 mgrs QDAY for management of mood disorder, explosiveness  Obtain routine Valproic Acid Serum level in AM Decrease Trazodone to 50 mgrs QHS for insomnia Continue to encourage full abstinence / relapse prevention efforts as part of treatment  goals.   Brigido Mera, Felicita Gage 07/10/2015, 4:43 PM

## 2015-07-11 LAB — VALPROIC ACID LEVEL: Valproic Acid Lvl: 80 ug/mL (ref 50.0–100.0)

## 2015-07-11 MED ORDER — ALBUTEROL SULFATE HFA 108 (90 BASE) MCG/ACT IN AERS
2.0000 | INHALATION_SPRAY | RESPIRATORY_TRACT | Status: DC | PRN
  Administered 2015-07-11 – 2015-07-15 (×4): 2 via RESPIRATORY_TRACT
  Filled 2015-07-11: qty 6.7

## 2015-07-11 NOTE — Progress Notes (Signed)
[facility]  07/11/2015 3:21 PM Ryan Bartlett  MRN:  [id number] Subjective:  Ryan Bartlett states that he when he left here last time he went to [facility] as [facility] and they did not admit him as his alcoholism was not that severe. He left relapsed went to [facility]. He was D/C.  He ran out of medications became suicidal he was admitted Principal Problem: Major depressive disorder, recurrent episode, moderate (HCC) Diagnosis:   Patient Active Problem List   Diagnosis Date Noted  . Major depressive disorder, recurrent episode, moderate (HCC) [F33.1] 07/09/2015  . Depression [F32.9]   . Suicidal ideation [R45.851]   . Cannabis use disorder, moderate, dependence (HCC) [F12.20] 06/06/2015  . Alcohol use disorder, moderate, dependence (HCC) [F10.20] 06/06/2015  . Bipolar I disorder, most recent episode depressed (HCC) [F31.30] 05/23/2013  . Cannabis abuse [F12.10] 03/30/2012  . ADHD (attention deficit hyperactivity disorder), combined type [F90.2] 08/28/2011  . Conduct disorder, adolescent onset type [F91.2] 08/28/2011   Total Time spent with patient: 30 minutes  Past Psychiatric History: see admission H and P  Past Medical History:  Past Medical History  Diagnosis Date  . ADHD (attention deficit hyperactivity disorder)   . Unspecified episodic mood disorder   . Oppositional defiant disorder   . Bipolar disorder (HCC)   . Depression     Past Surgical History  Procedure Laterality Date  . No past surgeries     Family History:  Family History  Problem Relation Age of Onset  . Depression Mother   . Diabetes Other   . Hyperlipidemia Other   . Hypertension Other    Family Psychiatric  History: see admission H and P Social History:  History  Alcohol Use No    Comment: "I actually recently quit"     History  Drug Use No    Social History   Social History  . Marital Status: Single    Spouse Name: N/A  . Number of Children: N/A  . Years of Education: N/A    Occupational History  . student     12th grade at [employer]   Social History Main Topics  . Smoking status: Current Every Day Smoker -- 0.50 packs/day for 0 years    Types: Cigarettes  . Smokeless tobacco: Never Used  . Alcohol Use: No     Comment: "I actually recently quit"  . Drug Use: No  . Sexual Activity: Not Asked     Comment: Pt reports that he is not sexually active   Other Topics Concern  . None   Social History Narrative   Additional Social History:    Pain Medications: None Prescriptions: See PTA medication list Over the Counter: None History of alcohol / drug use?: No history of alcohol / drug abuse Longest period of sobriety (when/how long): over a month Negative Consequences of Use: Legal Name of Substance 1: K-2 1 - Age of First Use: 19 years of age 49 - Amount (size/oz): Joint per day 1 - Frequency: 5 to 6 times in a week 1 - Duration: <1 year 1 - Last Use / Amount: A week ago.                  Sleep: Fair  Appetite:  Fair  Current Medications: Current Facility-Administered Medications  Medication Dose Route Frequency Provider Last Rate Last Dose  . acetaminophen (TYLENOL) tablet 650 mg  650 mg Oral Q6H PRN Worthy FlankIjeoma E Nwaeze, NP   650 mg at  07/10/15 0853  . albuterol (PROVENTIL HFA;VENTOLIN HFA) 108 (90 BASE) MCG/ACT inhaler 2 puff  2 puff Inhalation Q4H PRN Rachael Fee, MD   2 puff at 07/11/15 0941  . alum & mag hydroxide-simeth (MAALOX/MYLANTA) 200-200-20 MG/5ML suspension 30 mL  30 mL Oral Q4H PRN Worthy Flank, NP      . divalproex (DEPAKOTE ER) 24 hr tablet 1,250 mg  1,250 mg Oral QHS Worthy Flank, NP   1,250 mg at 07/10/15 2231  . magnesium hydroxide (MILK OF MAGNESIA) suspension 30 mL  30 mL Oral Daily PRN Worthy Flank, NP      . nicotine polacrilex (NICORETTE) gum 2 mg  2 mg Oral PRN Craige Cotta, MD   2 mg at 07/11/15 1307  . sertraline (ZOLOFT) tablet 50 mg  50 mg Oral Daily Craige Cotta, MD   50 mg at 07/11/15 0759   . traZODone (DESYREL) tablet 50 mg  50 mg Oral QHS Craige Cotta, MD   50 mg at 07/10/15 2231    Lab Results:  Results for orders placed or performed during the hospital encounter of 07/09/15 (from the past 48 hour(s))  Valproic acid level     Status: None   Collection Time: 07/11/15  6:37 AM  Result Value Ref Range   Valproic Acid Lvl 80 50.0 - 100.0 ug/mL    Comment: Performed at [facility]    Physical Findings: AIMS: Facial and Oral Movements Muscles of Facial Expression: None, normal Lips and Perioral Area: None, normal Jaw: None, normal Tongue: None, normal,Extremity Movements Upper (arms, wrists, hands, fingers): None, normal Lower (legs, knees, ankles, toes): None, normal, Trunk Movements Neck, shoulders, hips: None, normal, Overall Severity Severity of abnormal movements (highest score from questions above): None, normal Incapacitation due to abnormal movements: None, normal Patient's awareness of abnormal movements (rate only patient's report): No Awareness, Dental Status Current problems with teeth and/or dentures?: No Does patient usually wear dentures?: No  CIWA:  CIWA-Ar Total: 1 COWS:  COWS Total Score: 1  Musculoskeletal: Strength & Muscle Tone: within normal limits Gait & Station: normal Patient leans: normal  Psychiatric Specialty Exam: Review of Systems  Constitutional: Positive for malaise/fatigue.  Eyes: Negative.   Respiratory: Positive for cough.        Half a pack a day  Cardiovascular: Positive for chest pain and palpitations.  Gastrointestinal: Negative.   Genitourinary: Negative.   Musculoskeletal: Positive for joint pain.  Skin: Positive for itching.  Neurological: Positive for dizziness and weakness.  Endo/Heme/Allergies: Negative.   Psychiatric/Behavioral: Positive for depression and suicidal ideas. The patient is nervous/anxious and has insomnia.     Blood pressure 121/86, pulse 71, temperature 98.1 F (36.7 C),  temperature source Oral, resp. rate 16, height  (1.753 m), weight 104.781 kg (231 lb).Body mass index is 34.1 kg/(m^2).  General Appearance: Fairly Groomed  Patent attorney::  Fair  Speech:  Clear and Coherent  Volume:  fluctuates  Mood:  Anxious and Depressed  Affect:  Restricted  Thought Process:  Coherent and Goal Directed  Orientation:  Full (Time, Place, and Person)  Thought Content:  symptoms events worries concerns  Suicidal Thoughts:  Yes.  without intent/plan  Homicidal Thoughts:  No  Memory:  Immediate;   Fair Recent;   Fair Remote;   Fair  Judgement:  Fair  Insight:  Present and Shallow  Psychomotor Activity:  Restlessness  Concentration:  Fair  Recall:  [employer] of Knowledge:Fair  Language: Fair  Akathisia:  No  Handed:  Right  AIMS (if indicated):     Assets:  Desire for Improvement  ADL's:  Intact  Cognition: WNL  Sleep:  Number of Hours: 6.25   Treatment Plan Summary: Daily contact with patient to assess and evaluate symptoms and progress in treatment and Medication management Supportive approach/coping skills Substance abuse; continue to work a relapse prevention plan Mood instability; continue the Depakote, get a level optimize response Depression; continue the Zoloft 50 mg daily Insomnia: continue to work with the Trazodone Use CBT/mindfulness Explore residential treatment options Lisset Ketchem A 07/11/2015, 3:21 PM

## 2015-07-11 NOTE — [facility] Group Notes (Signed)
[facility] [name] Group Therapy  07/11/2015 1:15pm  Type of Therapy:  Group Therapy vercoming Obstacles  Participation Level:  Active, Monopolizing  Participation Quality:  Distracting  Affect:  Appropriate  Cognitive:  Appropriate and Oriented  Insight:  Developing/Improving and Improving  Engagement in Therapy:  Improving  Modes of Intervention:  Discussion, Exploration, Problem-solving and Support  Description of Group:   In this group patients will be encouraged to explore what they see as obstacles to their own wellness and recovery. They will be guided to discuss their thoughts, feelings, and behaviors related to these obstacles. The group will process together ways to cope with barriers, with attention given to specific choices patients can make. Each patient will be challenged to identify changes they are motivated to make in order to overcome their obstacles. This group will be process-oriented, with patients participating in exploration of their own experiences as well as giving and receiving support and challenge from other group members.  Summary of Patient Progress: Pt was monopolizing and intrusive but was receptive to redirection. Most of Pt's participation is related to his gang affiliation. Pt participation is also anecdotal and gets off-topic often. Pt identified his gang affiliation as an obstacle as he is "tired" of the lifestyle. However, Pt's insight is limited and he is observed to be ambivalent regarding motivation for change.   Therapeutic Modalities:   Cognitive Behavioral Therapy Solution Focused Therapy Motivational Interviewing Relapse Prevention Therapy   Chad CordialLauren Carter, LCSWA 07/11/2015 3:30 PM

## 2015-07-11 NOTE — Progress Notes (Signed)
D:  Patient's self inventory sheet, patient has fair sleep, sleep medication is helpful.  Fair appetite, low energy level, poor concentration.  Rated depression and anxiety 4, hopeless 5.  Denied withdrawals.  Denied SI, contracts for safety.  Physical pain, medication is helpful.  Goal is to work on anxiety. A:  Medications administered per MD orders.  Emotional support and encouragement given patient. R:  Denied SI and HI, contracts for safety.  Denied A/V hallucinations.  Denied pain.  Safety maintained with 15 minute checks.

## 2015-07-11 NOTE — Plan of Care (Signed)
Problem: Consults Goal: Depression Patient Education See Patient Education Module for education specifics.  Outcome: Progressing Nurse discussed depression/coping skills with patient.        

## 2015-07-11 NOTE — Progress Notes (Signed)
Adult Psychoeducational Group Note  Date:  07/11/2015 Time:  8:25 PM  Group Topic/Focus:  Wrap-Up Group:   The focus of this group is to help patients review their daily goal of treatment and discuss progress on daily workbooks.  Participation Level:  Active  Participation Quality:  Appropriate  Affect:  Appropriate  Cognitive:  Appropriate  Insight: Appropriate  Engagement in Group:  Engaged  Modes of Intervention:  Discussion  Additional Comments:  Pt rated overall day a 6 out of 10 because "it was straight". Pt reported that he did not have a goal for the day.   Cleotilde NeerJasmine S [employer] 07/11/2015, 9:00 PM

## 2015-07-11 NOTE — Progress Notes (Signed)
D. Pt had been up and visible in milieu this evening, did attend and participate in evening group activity. Pt did endorse depression but spoke about feeling a little better. Pt seen interacting appropriately with fellow peers in milieu. Pt received medications without incident and did not verbalize any complaints of pain. A. Support and encouragement provided. R. Safety maintained, will continue to monitor.

## 2015-07-11 NOTE — [facility] Group Notes (Signed)
[name]  07/11/2015 8:45 AM  Participation Quality: Alert, Appropriate and Oriented  Mood/Affect: Appropriate  Depression Rating: 5  Anxiety Rating: 5  Thoughts of Suicide: Pt endorses passive SI  Will you contract for safety? Yes  Current AVH: Pt denies  Plan for Discharge/Comments: Pt attended discharge planning group and actively participated in group. CSW discussed suicide prevention education with the group and encouraged them to discuss discharge planning and any relevant barriers. Pt reports that she feels "so-so." Per Pt, he just began services with [facility] and enjoys it so far.  Transportation Means: Pt reports access to transportation  Supports: No supports mentioned at this time  Chad CordialLauren Carter, LCSWA 07/11/2015 9:17 AM

## 2015-07-12 MED ORDER — TRAZODONE HCL 100 MG PO TABS
100.0000 mg | ORAL_TABLET | Freq: Every day | ORAL | Status: DC
  Administered 2015-07-12: 100 mg via ORAL
  Filled 2015-07-12 (×2): qty 1

## 2015-07-12 MED ORDER — CITALOPRAM HYDROBROMIDE 10 MG PO TABS
10.0000 mg | ORAL_TABLET | Freq: Every day | ORAL | Status: DC
  Administered 2015-07-13: 10 mg via ORAL
  Filled 2015-07-12 (×2): qty 1

## 2015-07-12 NOTE — Progress Notes (Signed)
Patient ID: DEMETRICK EICHENBERGER, male   DOB: 1995/10/22, 19 y.o.   MRN: [id number] [facility] MD Progress Note  07/12/2015 10:01 AM GEOVANNI RAHMING  MRN:  [id number] Subjective:  Reports partial improvement .  States he has developed nausea, which he attributes to Zoloft trial. States he has not vomited, but has felt like vomiting and that it is affecting his appetite. He is hoping to D/C Zoloft and try another medication. Also reports some residual insomnia . Objective : I have reviewed chart notes and have met with patient. He reports partial improvement compared to admission, but states he is still feeling  " a little on the down side ". He is tolerating  Depakote ER trial well, and recent valproic serum level is within therapeutic- 80. Of note, he does not think that nausea is related to Depakote trial, as he has been on this medication for " a long time " without side effects.  On unit, he is visible in day room, going to groups, interactive with peers, noted to be laughing/ conversing with peers in day room. No angry, explosive outbursts .    Principal Problem: Major depressive disorder, recurrent episode, moderate ([place]) Diagnosis:   Patient Active Problem List   Diagnosis Date Noted  . Major depressive disorder, recurrent episode, moderate ([place]) [F33.1] 07/09/2015  . Depression [F32.9]   . Suicidal ideation [R45.851]   . Cannabis use disorder, moderate, dependence ([place]) [F12.20] 06/06/2015  . Alcohol use disorder, moderate, dependence ([place]) [F10.20] 06/06/2015  . Bipolar I disorder, most recent episode depressed ([place]) [F31.30] 05/23/2013  . Cannabis abuse [F12.10] 03/30/2012  . ADHD (attention deficit hyperactivity disorder), combined type [F90.2] 08/28/2011  . 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 ADHD (attention deficit hyperactivity disorder)   .  Unspecified episodic mood disorder   . Oppositional defiant disorder   . Bipolar disorder ([place])   . Depression     Past Surgical History  Procedure Laterality Date  . No past surgeries     Family History:  Family History  Problem Relation Age of Onset  . Depression Mother   . Diabetes Other   . Hyperlipidemia Other   . Hypertension Other     Social History:  History  Alcohol Use No    Comment: "I actually recently quit"     History  Drug Use No    Social History   Social History  . Marital Status: Single    Spouse Name: N/A  . Number of Children: N/A  . Years of Education: N/A   Occupational History  . student     12th grade at [employer]  . Smoking status: Current Every Day Smoker -- 0.50 packs/day for 0 years    Types: Cigarettes  . Smokeless tobacco: Never Used  . Alcohol Use: No     Comment: "I actually recently quit"  . Drug Use: No  . Sexual Activity: Not Asked     Comment: Pt reports that he is not sexually active   Other Topics Concern  . None   Social History Narrative   Additional Social History:    Pain Medications: None Prescriptions: See PTA medication list Over the Counter: None History of alcohol / drug use?: No history of alcohol / drug abuse Longest period of sobriety (when/how long): over a month  Negative Consequences of Use: Legal Name of Substance 1: K-2 1 - Age of First Use: 19 years of age 26 - Amount (size/oz): Joint per day 1 - Frequency: 5 to 6 times in a week 1 - Duration: <1 year 1 - Last Use / Amount: A week ago.  Sleep:  Improving but still " waking up a lot"  Appetite: good   Current Medications: Current Facility-Administered Medications  Medication Dose Route Frequency Provider Last Rate Last Dose  . acetaminophen (TYLENOL) tablet 650 mg  650 mg Oral Q6H PRN Harriet Butte, NP   650 mg at 07/10/15 0853  . albuterol (PROVENTIL HFA;VENTOLIN HFA) 108 (90 BASE) MCG/ACT inhaler 2 puff  2  puff Inhalation Q4H PRN Nicholaus Bloom, MD   2 puff at 07/11/15 0941  . alum & mag hydroxide-simeth (MAALOX/MYLANTA) 200-200-20 MG/5ML suspension 30 mL  30 mL Oral Q4H PRN Harriet Butte, NP   30 mL at 07/12/15 0759  . divalproex (DEPAKOTE ER) 24 hr tablet 1,250 mg  1,250 mg Oral QHS Harriet Butte, NP   1,250 mg at 07/11/15 2230  . magnesium hydroxide (MILK OF MAGNESIA) suspension 30 mL  30 mL Oral Daily PRN Harriet Butte, NP      . nicotine polacrilex (NICORETTE) gum 2 mg  2 mg Oral PRN Jenne Campus, MD   2 mg at 07/11/15 1307  . sertraline (ZOLOFT) tablet 50 mg  50 mg Oral Daily Jenne Campus, MD   50 mg at 07/12/15 0759  . traZODone (DESYREL) tablet 50 mg  50 mg Oral QHS Jenne Campus, MD   50 mg at 07/11/15 2230    Lab Results:  Results for orders placed or performed during the hospital encounter of 07/09/15 (from the past 48 hour(s))  Valproic acid level     Status: None   Collection Time: 07/11/15  6:37 AM  Result Value Ref Range   Valproic Acid Lvl 80 50.0 - 100.0 ug/mL    Comment: Performed at [facility]    Physical Findings: AIMS: Facial and Oral Movements Muscles of Facial Expression: None, normal Lips and Perioral Area: None, normal Jaw: None, normal Tongue: None, normal,Extremity Movements Upper (arms, wrists, hands, fingers): None, normal Lower (legs, knees, ankles, toes): None, normal, Trunk Movements Neck, shoulders, hips: None, normal, Overall Severity Severity of abnormal movements (highest score from questions above): None, normal Incapacitation due to abnormal movements: None, normal Patient's awareness of abnormal movements (rate only patient's report): No Awareness, Dental Status Current problems with teeth and/or dentures?: No Does patient usually wear dentures?: No  CIWA:  CIWA-Ar Total: 1 COWS:  COWS Total Score: 1  Musculoskeletal: Strength & Muscle Tone: within normal limits Gait & Station: normal Patient leans:  N/A  Psychiatric Specialty Exam: ROS (+) nausea, no vomiting, no headache, some abdominal discomfort  Associated with nausea  Blood pressure 127/79, pulse 77, temperature 97.7 F (36.5 C), temperature source Oral, resp. rate 18, height $RemoveBe'5\' 9"'IJHlWWhuS$  (1.753 m), weight 231 lb (104.781 kg).Body mass index is 34.1 kg/(m^2).  General Appearance:  Improved grooming   Eye Contact::  Good  Speech:  Normal Rate  Volume:  Normal  Mood:  Gradually improving   Affect:  Appropriate and more reactive   Thought Process:  Linear  Orientation:  Full (Time, Place, and Person)  Thought Content:  denies hallucinations, no delusions   Suicidal Thoughts:  No at this time denies any suicidal ideations or self injurious ideations  Homicidal Thoughts:  No  Memory:  recent and remote grossly intact   Judgement:  Other:  improving   Insight:  improving   Psychomotor Activity:  Normal  Concentration:  Good  Recall:  Good  Fund of Knowledge:Good  Language: Good  Akathisia:  Negative  Handed:  Left  AIMS (if indicated):     Assets:  Communication Skills Desire for Improvement Resilience  ADL's:  Intact  Cognition: WNL  Sleep:  Number of Hours: 5.75  Assessment - patient presents euthymic, with good range of affect, but describes lingering depression, although acknowledges improvement compared to admission. He states Zoloft poorly tolerated due to significant nausea he relates temporally to Zoloft trial. He is wanting to change this medication. He is tolerating Depakote ER well, and has therapeutic serum level ( 80) . It is helping manage history of brief mood swings and instability/ explosiveness . Patient reports he is sleeping better, but still waking up often during the night, denies side effects related to Trazodone.  Treatment Plan Summary: Daily contact with patient to assess and evaluate symptoms and progress in treatment, Medication management, Plan inpatient treatment and medications as below  Encourage  ongoing milieu participation to work on coping skills and symptom reduction D/C Zoloft  Due to side effects- nausea. Start Celexa 10 mgrs QAM initially for depression and anxiety symptoms- side effects discussed Continue Depakote ER 1250 mgrs QDAY for management of mood disorder, explosiveness  Increase  Trazodone to to 100 mgrs QHS for insomnia Continue to encourage full abstinence / relapse prevention efforts as part of treatment goals.   COBOS, [place] 07/12/2015, 10:01 AM

## 2015-07-12 NOTE — [facility] Group Notes (Signed)
[facility] [name] Group Therapy 07/12/2015 1:15 PM  Type of Therapy: Group Therapy- Feelings about Diagnosis  Pt came briefly to group and left early; did not return.  Chad CordialLauren Carter, LCSWA 07/12/2015 4:29 PM

## 2015-07-12 NOTE — Progress Notes (Signed)
D: Patient in the dayroom on approach.  Patient states he had been depressed today.  Patient has been loud and staff had to constantly ask him to lower his voice.  Patient states he woke up depressed and states it got worse because he was unable to get his energy put by going outside or to the gym.  Patient has been verbally inappropriate and has to be redirected.  Patient states he is passive SI.  Patient denies HI and denies AVH.   A: Staff to monitor Q 15 mins for safety.  Encouragement and support offered.  Scheduled medications administered per orders.   R: Patient remains safe on the unit.  Patient attended group tonight.  Patient visible on the unit and interacting with peers.  Patient taking administered medications.

## 2015-07-12 NOTE — Progress Notes (Signed)
Adult Psychoeducational Group Note  Date:  07/12/2015 Time:  9:26 PM  Group Topic/Focus:  Wrap-Up Group:   The focus of this group is to help patients review their daily goal of treatment and discuss progress on daily workbooks.  Participation Level:  Active  Participation Quality:  Appropriate  Affect:  Appropriate  Cognitive:  Alert  Insight: Appropriate  Engagement in Group:  Engaged  Modes of Intervention:  Discussion  Additional Comments:  Patient stated he woke up depressed. Patient stated he wanted to go to the gym, but couldn't. Patient stated going to the gym was his coping skill.   Yoanna Jurczyk L Shakiah Wester 07/12/2015, 9:26 PM

## 2015-07-12 NOTE — Progress Notes (Signed)
Recreation Therapy Notes  Animal-Assisted Activity (AAA) Program Checklist/Progress Notes Patient Eligibility Criteria Checklist & Daily Group note for Rec Tx Intervention  Date: 11.22.2016 Time: 2:45pm [address]   AAA/T Program Assumption of Risk Form signed by Patient/ or Parent Legal Guardian yes  Patient is free of allergies or sever asthma yes  Patient reports no fear of animals yes  Patient reports no history of cruelty to animals yes  Patient understands his/her participation is voluntary yes  Patient washes hands before animal contact yes  Patient washes hands after animal contact yes  Behavioral Response: Appropriate  Education: Hand Washing, Appropriate Animal Interaction   Education Outcome: Acknowledges education.   Clinical Observations/Feedback: Patient engaged appropriately with peers and therapy dog during session.   Marykay Lexenise L Kailiana Granquist, LRT/CTRS  Jearl KlinefelterBlanchfield, Makyla Bye L 07/12/2015 3:26 PM

## 2015-07-12 NOTE — Progress Notes (Signed)
Patient ID: Ryan Bartlett, male   DOB: [birth year], 19 y.o.   MRN: [id number]  Pt currently presents with a flat affect and anxious behavior. Per self inventory, pt rates depression at a 2, hopelessness 3 and anxiety 4. Pt's daily goal is to "no pain." Pt reports poor sleep, a fair appetite, low energy and poor concentration. Pt hyperactive today and loud in the milieu. Pt cooperative and participatory in group.   Pt provided with medications per providers orders. Pt's labs and vitals were monitored throughout the day. Pt supported emotionally and encouraged to express concerns and questions. Pt educated on medications. EKG completed.   Pt's safety ensured with 15 minute and environmental checks. Pt currently endorses some SI with a plan to "walk into traffic or take a bunch of pills." Pt denies HI and A/V hallucinations. Pt verbally agrees to seek staff if SI/HI or A/VH occurs and to consult with staff before acting on harmful thoughts. Will continue POC.

## 2015-07-12 NOTE — Progress Notes (Signed)
D. Pt had been up and visible in milieu this evening, did attend and participate in evening group activity. Pt seen interacting appropriately with peers in milieu. Pt spoke of his day and also spoke about events leading up to him being hospitalized and spoke about the feelings of depression and suicide that he gets that comes on and becomes overwhelming for him. Pt has appeared depressed on the unit and did receive all medications without incident. A. Support and encouragement provided. R. Safety maintained, will continue to monitor.

## 2015-07-12 NOTE — Progress Notes (Signed)
Patient ID: Ryan Bartlett, male   DOB: November 04, 1995, 19 y.o.   MRN: [id number]  Adult Psychoeducational Group Note  Date:  07/12/2015 Time:  09:15am  Group Topic/Focus:  Recovery Goals:   The focus of this group is to identify appropriate goals for recovery and establish a plan to achieve them.  Participation Level:  Active  Participation Quality:  Appropriate  Affect:  Flat  Cognitive:  Appropriate  Insight: Good  Engagement in Group:  Improving  Modes of Intervention:  Activity, Discussion, Orientation and Support  Additional Comments:  Pt able to identify aspects of personal recovery plan.   [name], [name] 07/12/2015, 10:22 AM

## 2015-07-13 MED ORDER — TRAZODONE HCL 50 MG PO TABS
75.0000 mg | ORAL_TABLET | Freq: Every day | ORAL | Status: DC
  Administered 2015-07-13 – 2015-07-15 (×3): 75 mg via ORAL
  Filled 2015-07-13 (×5): qty 1

## 2015-07-13 MED ORDER — DIPHENHYDRAMINE HCL 25 MG PO CAPS
25.0000 mg | ORAL_CAPSULE | ORAL | Status: DC | PRN
  Administered 2015-07-13 – 2015-07-19 (×3): 25 mg via ORAL
  Filled 2015-07-13 (×3): qty 1

## 2015-07-13 MED ORDER — CITALOPRAM HYDROBROMIDE 20 MG PO TABS
20.0000 mg | ORAL_TABLET | Freq: Every day | ORAL | Status: DC
  Administered 2015-07-14 – 2015-07-15 (×2): 20 mg via ORAL
  Filled 2015-07-13 (×3): qty 1

## 2015-07-13 MED ORDER — BENZTROPINE MESYLATE 1 MG PO TABS
1.0000 mg | ORAL_TABLET | Freq: Once | ORAL | Status: AC
  Administered 2015-07-13: 1 mg via ORAL
  Filled 2015-07-13 (×2): qty 1

## 2015-07-13 MED ORDER — DIPHENHYDRAMINE HCL 50 MG/ML IJ SOLN: 25.0000 mg | INTRAMUSCULAR | Status: DC | PRN

## 2015-07-13 NOTE — Tx Team (Signed)
Interdisciplinary Treatment Plan Update (Adult) Date: 07/13/2015   Date: 07/13/2015 8:33 AM  Progress in Treatment:  Attending groups: Yes  Participating in groups: Yes  Taking medication as prescribed: Yes  Tolerating medication: Yes  Family/Significant othe contact made: No, Pt declines Patient understands diagnosis: Continuing to assess Discussing patient identified problems/goals with staff: Yes  Medical problems stabilized or resolved: Yes  Denies suicidal/homicidal ideation: Yes Patient has not harmed self or Others: Yes   New problem(s) identified: None identified at this time.   Discharge Plan or Barriers: Pt will return home and follow-up with [facility]  Additional comments: n/a   Reason for Continuation of Hospitalization:  Depression Medication stabilization Suicidal ideation  Estimated length of stay: 3-5 days  Review of initial/current patient goals per problem list:   1.  Goal(s): Patient will participate in aftercare plan  Met:  Yes  Target date: 3-5 days from date of admission   As evidenced by: Patient will participate within aftercare plan AEB aftercare provider and housing plan at discharge being identified.   07/13/15: Pt will return home and follow-up with his ACTT services  2.  Goal (s): Patient will exhibit decreased depressive symptoms and suicidal ideations.  Met:  Goal progressing  Target date: 3-5 days from date of admission   As evidenced by: Patient will utilize self rating of depression at 3 or below and demonstrate decreased signs of depression or be deemed stable for discharge by MD.  07/13/15: Pt rates depression at 5/10; denies SI  Attendees:  Patient:    Family:    Physician: Dr. Parke Poisson, MD  07/13/2015 8:33 AM  Nursing: Lars Pinks, RN Case manager  07/13/2015 8:33 AM  Clinical Social Worker Peri Maris, [place] 07/13/2015 8:33 AM  Other: Tilden Fossa, [place] 07/13/2015 8:33 AM  Clinical: Marcella Dubs, RN 07/13/2015 8:33 AM  Other: , RN Charge Nurse 07/13/2015 8:33 AM  Other:     Peri Maris, [place] Work [phone]

## 2015-07-13 NOTE — Progress Notes (Signed)
Patient ID: Ryan Bartlett, male   DOB: 04-Oct-1995, 19 y.o.   MRN: [id number] [facility] MD Progress Note  07/13/2015 10:56 AM Ryan Bartlett  MRN:  [id number] Subjective:   Patient states hs is feeling " kind of depressed ". States , however, that he is better than on admission. Of note, feels strongly he is not ready for discharge and in particular worries about family tension at thanksgiving. States " we spend it with my grandmother, who calls me names, calls me stupid, and makes me feel really depressed". States " I really do not think I am strong enough to face that kind of thing right now". As noted, we have recently changed medication regimen due to nausea, felt to be secondary to Zoloft . Currently on Celexa, and tolerating well thus far. No nausea or vomiting at this time. States that Trazodone is helping, but wants to lower dose slightly to minimize subjective sense of  AM sedation when wakes up ( at this time is fully alert and attentive )  Objective : I have reviewed chart notes and have met with patient. As noted, reports some lingering depression and anxiety, and in particular ruminates about family dynamics, and feeling unready to face hyper critical family members. Of note, denies any violent or homicidal ideations . Patient states he is making a sustained effort to avoid gang/ criminal type behaviors, and is trying to be " a better person, a better brother". No  Overtly disruptive behaviors on unit, although at times needs redirections for being loud . He does interact well with peers or about his age, and affect is reactive in these circumstances . As noted, currently denying nausea, vomiting- now off Zoloft and on Celexa      Principal Problem: Major depressive disorder, recurrent episode, moderate ([place]) Diagnosis:   Patient Active Problem List   Diagnosis Date Noted  . Major depressive disorder, recurrent episode, moderate ([place]) [F33.1] 07/09/2015  . Depression [F32.9]   .  Suicidal ideation [R45.851]   . Cannabis use disorder, moderate, dependence ([place]) [F12.20] 06/06/2015  . Alcohol use disorder, moderate, dependence ([place]) [F10.20] 06/06/2015  . Bipolar I disorder, most recent episode depressed ([place]) [F31.30] 05/23/2013  . Cannabis abuse [F12.10] 03/30/2012  . ADHD (attention deficit hyperactivity disorder), combined type [F90.2] 08/28/2011  . Conduct disorder, adolescent onset type [F91.2] 08/28/2011   Total Time spent with patient:  25 minutes     Past Medical History:  Past Medical History  Diagnosis Date  . ADHD (attention deficit hyperactivity disorder)   . Unspecified episodic mood disorder   . Oppositional defiant disorder   . Bipolar disorder ([place])   . Depression     Past Surgical History  Procedure Laterality Date  . No past surgeries     Family History:  Family History  Problem Relation Age of Onset  . Depression Mother   . Diabetes Other   . Hyperlipidemia Other   . Hypertension Other     Social History:  History  Alcohol Use No    Comment: "I actually recently quit"     History  Drug Use No    Social History   Social History  . Marital Status: Single    Spouse Name: N/A  . Number of Children: N/A  . Years of Education: N/A   Occupational History  . student     12th grade at [employer]  . Smoking status: Current Every Day Smoker -- 0.50  packs/day for 0 years    Types: Cigarettes  . Smokeless tobacco: Never Used  . Alcohol Use: No     Comment: "I actually recently quit"  . Drug Use: No  . Sexual Activity: Not Asked     Comment: Pt reports that he is not sexually active   Other Topics Concern  . None   Social History Narrative   Additional Social History:    Pain Medications: None Prescriptions: See PTA medication list Over the Counter: None History of alcohol / drug use?: No history of alcohol / drug abuse Longest period of sobriety (when/how long): over a month Negative  Consequences of Use: Legal Name of Substance 1: K-2 1 - Age of First Use: 19 years of age 64 - Amount (size/oz): Joint per day 1 - Frequency: 5 to 6 times in a week 1 - Duration: <1 year 1 - Last Use / Amount: A week ago.  Sleep:  Improving   Appetite: good   Current Medications: Current Facility-Administered Medications  Medication Dose Route Frequency Provider Last Rate Last Dose  . acetaminophen (TYLENOL) tablet 650 mg  650 mg Oral Q6H PRN Harriet Butte, NP   650 mg at 07/12/15 1013  . albuterol (PROVENTIL HFA;VENTOLIN HFA) 108 (90 BASE) MCG/ACT inhaler 2 puff  2 puff Inhalation Q4H PRN Nicholaus Bloom, MD   2 puff at 07/12/15 1522  . alum & mag hydroxide-simeth (MAALOX/MYLANTA) 200-200-20 MG/5ML suspension 30 mL  30 mL Oral Q4H PRN Harriet Butte, NP   30 mL at 07/12/15 0759  . citalopram (CELEXA) tablet 10 mg  10 mg Oral Daily Jenne Campus, MD   10 mg at 07/13/15 1700  . divalproex (DEPAKOTE ER) 24 hr tablet 1,250 mg  1,250 mg Oral QHS Harriet Butte, NP   1,250 mg at 07/12/15 2254  . magnesium hydroxide (MILK OF MAGNESIA) suspension 30 mL  30 mL Oral Daily PRN Harriet Butte, NP      . nicotine polacrilex (NICORETTE) gum 2 mg  2 mg Oral PRN Jenne Campus, MD   2 mg at 07/12/15 1836  . traZODone (DESYREL) tablet 100 mg  100 mg Oral QHS Jenne Campus, MD   100 mg at 07/12/15 2254    Lab Results:  No results found for this or any previous visit (from the past 24 hour(s)).  Physical Findings: AIMS: Facial and Oral Movements Muscles of Facial Expression: None, normal Lips and Perioral Area: None, normal Jaw: None, normal Tongue: None, normal,Extremity Movements Upper (arms, wrists, hands, fingers): None, normal Lower (legs, knees, ankles, toes): None, normal, Trunk Movements Neck, shoulders, hips: None, normal, Overall Severity Severity of abnormal movements (highest score from questions above): None, normal Incapacitation due to abnormal movements: None,  normal Patient's awareness of abnormal movements (rate only patient's report): No Awareness, Dental Status Current problems with teeth and/or dentures?: No Does patient usually wear dentures?: No  CIWA:  CIWA-Ar Total: 1 COWS:  COWS Total Score: 1  Musculoskeletal: Strength & Muscle Tone: within normal limits Gait & Station: normal Patient leans: N/A  Psychiatric Specialty Exam: ROS  Nausea now improved, no vomiting, no headache, no abdominal distress or pain today  Blood pressure 133/81, pulse 78, temperature 97.5 F (36.4 C), temperature source Oral, resp. rate 18, height _0  (1.753 m), weight 231 lb (104.781 kg).Body mass index is 34.1 kg/(m^2).  General Appearance:  Improved grooming   Eye Contact::  Good  Speech:  Normal Rate  Volume:  Normal  Mood:   Reports some ongoing depression, sadness  Affect:   Constricted but fully reactive, and affect becomes much brighter in social situations with peers   Thought Process:  Linear  Orientation:  Full (Time, Place, and Person)  Thought Content:  denies hallucinations, no delusions  ruminative about family dynamics, feeling some family members are hyper critical and offensive to him at times   Suicidal Thoughts:  No at this time denies any suicidal ideations or self injurious ideations  Homicidal Thoughts:  No denies any violent ideations or HI, specifically also denies any thoughts of violence towards any family member  Memory:  recent and remote grossly intact   Judgement:  Other:  improving   Insight:  improving   Psychomotor Activity:  Normal  Concentration:  Good  Recall:  Good  Fund of Knowledge:Good  Language: Good  Akathisia:  Negative  Handed:  Left  AIMS (if indicated):     Assets:  Communication Skills Desire for Improvement Resilience  ADL's:  Intact  Cognition: WNL  Sleep:  Number of Hours: 6.5  Assessment - patient has improved compared to admission. He recently developed significant nausea , felt to be  associated with Zoloft trial. Now off Zoloft and on Celexa , and GI side effects have subsided. Tolerating Celexa well thus far. Patient reports he is still depressed and does present constricted in affect at this time, although he appears bright when interacting with selected peers in day room. He ruminates about family dynamics,  Particularly during thanksgiving, feeling his grandmother is hypercritical, offensive, and feeling that he is not feeling strong enough to deal with this without likely severe worsening of depression.   Treatment Plan Summary: Daily contact with patient to assess and evaluate symptoms and progress in treatment, Medication management, Plan inpatient treatment and medications as below  Encourage ongoing milieu participation to work on coping skills and symptom reduction Increase  Celexa  To 20  mgrs QAM  for depression and anxiety symptoms- side effects discussed Continue Depakote ER 1250 mgrs QDAY for management of mood disorder, explosiveness  Decrease  Trazodone to 75 mgrs QHS for insomnia Continue to encourage full abstinence / relapse prevention efforts as part of treatment goals.   Sedonia Kitner 07/13/2015, 10:56 AM

## 2015-07-13 NOTE — Progress Notes (Signed)
Patient ID: Ryan Bartlett, male   DOB: Oct 29, 1995, 19 y.o.   MRN: [id number]  Pt currently presents with a masked affect and anxious behavior. Per self inventory, pt rates depression at a 8, hopelessness 7 and anxiety 5. Pt's daily goal is to "communicating pain level to nurse" and they intend to do so by "talk calm and ask for help when I need it." Pt reports good sleep, a fair appetite, low energy and poor concentration. Pt less restless today, pt eats most of meals in the cafeteria. Pt seen smiling and laughing with peers in dayroom.   Pt provided with medications per providers orders. Pt's labs and vitals were monitored throughout the day. Pt supported emotionally and encouraged to express concerns and questions. Pt educated on medications. Pt consulted about long term goals and plans. Pt reports that he would like to  Pt's safety ensured with 15 minute and environmental checks. Pt currently endorses some SI but has no specific plan. Pt denies HI and A/V hallucinations. Pt verbally agrees to seek staff if HI or A/VH occurs and to consult with staff before acting on any harmful thoughts. Will continue POC.

## 2015-07-13 NOTE — Progress Notes (Signed)
Recreation Therapy Notes   Date: 11.23.2016 Time: 9:30am Location: [address] Group Room   Group Topic: Stress Management  Goal Area(s) Addresses:  Patient will actively participate in stress management techniques presented during session.   Behavioral Response: Did not attend.   Valia Wingard L Kambre Messner, LRT/CTRS  Kain Milosevic L 07/13/2015 9:54 AM 

## 2015-07-13 NOTE — Progress Notes (Signed)
Patient ID: Ryan Bartlett, male   DOB: 09-02-1995, 19 y.o.   MRN: [id number]  Pt reports to writer, "My muscles are twitching. It's like last time. I need some Cogentin." Pt reports having a history of EPS with Haldol a couple of years ago and he reports being told that the side effects "could be permanent." Provider, Roma Schanzikia called at 5:25 and given a message about pt and symptoms. No call has been returned, provider not in building currently. Will continue to monitor pt.

## 2015-07-13 NOTE — [facility] Group Notes (Signed)
[facility] [name] Group Therapy 07/13/2015 1:15 PM  Type of Therapy: Group Therapy- Emotion Regulation  Participation Level: Active   Participation Quality:  Off-topic  Affect: Appropriate  Cognitive: Alert and Oriented   Insight:  Limited  Engagement in Therapy: Developing/Improving and Engaged   Modes of Intervention: Clarification, Confrontation, Discussion, Education, Exploration, Limit-setting, Orientation, Problem-solving, Rapport Building, Dance movement psychotherapisteality Testing, Socialization and Support  Summary of Progress/Problems: The topic for group today was emotional regulation. This group focused on both positive and negative emotion identification and allowed group members to process ways to identify feelings, regulate negative emotions, and find healthy ways to manage internal/external emotions. Group members were asked to reflect on a time when their reaction to an emotion led to a negative outcome and explored how alternative responses using emotion regulation would have benefited them. Group members were also asked to discuss a time when emotion regulation was utilized when a negative emotion was experienced. Pt attempts to participate but his comments continue to be largely anecdotal and minimally therapeutic. Pt was attentive to discussion.    Ryan CordialLauren Carter, LCSWA 07/13/2015 2:54 PM

## 2015-07-13 NOTE — Progress Notes (Signed)
Pt visible on the unit.  He has spent most of the evening in the dayroom watching TV.  He reports his day has been ok.  He is unsure of his discharge date.  He states he has suicidal thoughts off and on, but can contract for safety.  He denies Hi/AVH.  His behavior has been a little more subdued tonight as some of the patients that were on the hall last night were discharged today.  Conversation was minimal, but pt made his needs known to staff.  Support and encouragement offered.  Discharge plans are in process.  Pt is taking his meds without issue.  Safety maintained with q15 minute checks.

## 2015-07-13 NOTE — [facility] Group Notes (Signed)
[facility] [name] Aftercare Discharge Planning Group Note  07/13/2015 8:45 AM  Pt did not attend, declined invitation.   Chad CordialLauren Carter, LCSWA 07/13/2015 9:46 AM

## 2015-07-13 NOTE — Progress Notes (Addendum)
Patient ID: Ryan Bartlett, male   DOB: 05/23/1996, 19 y.o.   MRN: [id number]  Provider contacted writer about muscle twitching. Telephone orders given, verbal read back completed. See MAR.

## 2015-07-14 NOTE — Progress Notes (Addendum)
Patient ID: Ryan Bartlett, male   DOB: 13-Jul-1996, 19 y.o.   MRN: [id number] [facility] MD Progress Note  07/14/2015 10:30 AM ABDULKADIR EMMANUEL  MRN:  [id number] Subjective:   Patient states hs is feeling " kind of depressed ". States , however somewhat better since admission.  celexa was increased to $RemoveBefo'20mg'npFXqxAefJi$  yesterday. Some improvement. But still remains down and amotivated.  Objective : I have reviewed chart notes and have met with patient. As noted, reports some lingering depression and anxiety, and in particular ruminates about family dynamics, and feeling unready to face hyper critical family members. Of note, denies any violent or homicidal ideations . Patient states he is making a sustained effort to avoid gang/ criminal type behaviors, and is trying to be " a better person, a better brother". Slow improvement in mood but tolerating celexa.  No  Overtly disruptive behaviors on unit, although at times needs redirections for being loud . He does interact well with peers or about his age, and affect is reactive in these circumstances . As noted, currently denying nausea, vomiting- now off Zoloft and on Celexa      Principal Problem: Major depressive disorder, recurrent episode, moderate ([place]) Diagnosis:   Patient Active Problem List   Diagnosis Date Noted  . Major depressive disorder, recurrent episode, moderate ([place]) [F33.1] 07/09/2015  . Depression [F32.9]   . Suicidal ideation [R45.851]   . Cannabis use disorder, moderate, dependence ([place]) [F12.20] 06/06/2015  . Alcohol use disorder, moderate, dependence ([place]) [F10.20] 06/06/2015  . Bipolar I disorder, most recent episode depressed ([place]) [F31.30] 05/23/2013  . Cannabis abuse [F12.10] 03/30/2012  . ADHD (attention deficit hyperactivity disorder), combined type [F90.2] 08/28/2011  . Conduct disorder, adolescent onset type [F91.2] 08/28/2011   Total Time spent with patient:  25 minutes     Past Medical History:  Past Medical History   Diagnosis Date  . ADHD (attention deficit hyperactivity disorder)   . Unspecified episodic mood disorder   . Oppositional defiant disorder   . Bipolar disorder ([place])   . Depression     Past Surgical History  Procedure Laterality Date  . No past surgeries     Family History:  Family History  Problem Relation Age of Onset  . Depression Mother   . Diabetes Other   . Hyperlipidemia Other   . Hypertension Other     Social History:  History  Alcohol Use No    Comment: "I actually recently quit"     History  Drug Use No    Social History   Social History  . Marital Status: Single    Spouse Name: N/A  . Number of Children: N/A  . Years of Education: N/A   Occupational History  . student     12th grade at [employer] Topics  . Smoking status: Current Every Day Smoker -- 0.50 packs/day for 0 years    Types: Cigarettes  . Smokeless tobacco: Never Used  . Alcohol Use: No     Comment: "I actually recently quit"  . Drug Use: No  . Sexual Activity: Not Asked     Comment: Pt reports that he is not sexually active   Other Topics Concern  . None   Social History Narrative   Additional Social History:    Pain Medications: None Prescriptions: See PTA medication list Over the Counter: None History of alcohol / drug use?: No history of alcohol / drug abuse Longest period of sobriety (when/how  long): over a month Negative Consequences of Use: Legal Name of Substance 1: K-2 1 - Age of First Use: 19 years of age 3 - Amount (size/oz): Joint per day 1 - Frequency: 5 to 6 times in a week 1 - Duration: <1 year 1 - Last Use / Amount: A week ago.  Sleep:  Improving   Appetite: good   Current Medications: Current Facility-Administered Medications  Medication Dose Route Frequency Provider Last Rate Last Dose  . acetaminophen (TYLENOL) tablet 650 mg  650 mg Oral Q6H PRN Harriet Butte, NP   650 mg at 07/12/15 1013  . albuterol (PROVENTIL HFA;VENTOLIN  HFA) 108 (90 BASE) MCG/ACT inhaler 2 puff  2 puff Inhalation Q4H PRN Nicholaus Bloom, MD   2 puff at 07/13/15 1204  . alum & mag hydroxide-simeth (MAALOX/MYLANTA) 200-200-20 MG/5ML suspension 30 mL  30 mL Oral Q4H PRN Harriet Butte, NP   30 mL at 07/12/15 0759  . citalopram (CELEXA) tablet 20 mg  20 mg Oral Daily Jenne Campus, MD   20 mg at 07/14/15 0931  . diphenhydrAMINE (BENADRYL) capsule 25 mg  25 mg Oral PRN Nanci Pina, FNP   25 mg at 07/13/15 1818   Or  . diphenhydrAMINE (BENADRYL) injection 25 mg  25 mg Intramuscular PRN Nanci Pina, FNP      . divalproex (DEPAKOTE ER) 24 hr tablet 1,250 mg  1,250 mg Oral QHS Harriet Butte, NP   1,250 mg at 07/13/15 2203  . magnesium hydroxide (MILK OF MAGNESIA) suspension 30 mL  30 mL Oral Daily PRN Harriet Butte, NP      . nicotine polacrilex (NICORETTE) gum 2 mg  2 mg Oral PRN Jenne Campus, MD   2 mg at 07/13/15 1952  . traZODone (DESYREL) tablet 75 mg  75 mg Oral QHS Jenne Campus, MD   75 mg at 07/13/15 2203    Lab Results:  No results found for this or any previous visit (from the past 48 hour(s)).  Physical Findings: AIMS: Facial and Oral Movements Muscles of Facial Expression: None, normal Lips and Perioral Area: None, normal Jaw: None, normal Tongue: None, normal,Extremity Movements Upper (arms, wrists, hands, fingers): None, normal Lower (legs, knees, ankles, toes): None, normal, Trunk Movements Neck, shoulders, hips: None, normal, Overall Severity Severity of abnormal movements (highest score from questions above): None, normal Incapacitation due to abnormal movements: None, normal Patient's awareness of abnormal movements (rate only patient's report): No Awareness, Dental Status Current problems with teeth and/or dentures?: No Does patient usually wear dentures?: No  CIWA:  CIWA-Ar Total: 1 COWS:  COWS Total Score: 1  Musculoskeletal: Strength & Muscle Tone: within normal limits Gait & Station:  normal Patient leans: N/A  Psychiatric Specialty Exam: ROS  Nausea now improved, no vomiting, no headache, no abdominal distress or pain today  Blood pressure 128/71, pulse 80, temperature 97.8 F (36.6 C), temperature source Oral, resp. rate 18, height $RemoveBe'5\' 9"'phDnBQZBd$  (1.753 m), weight 104.781 kg (231 lb).Body mass index is 34.1 kg/(m^2).  General Appearance:  Improved grooming   Eye Contact::  Good  Speech:  Normal Rate  Volume:  Normal  Mood:   Reports some ongoing sadness  Affect:   Constricted but fully reactive, and affect becomes much brighter in social situations with peers   Thought Process:  Linear  Orientation:  Full (Time, Place, and Person)  Thought Content:  denies hallucinations, no delusions  ruminative about family dynamics, feeling some  family members are hyper critical and offensive to him at times   Suicidal Thoughts:  No at this time denies any suicidal ideations or self injurious ideations  Homicidal Thoughts:  No denies any violent ideations or HI, specifically also denies any thoughts of violence towards any family member  Memory:  recent and remote grossly intact   Judgement:  Other:  improving   Insight:  improving   Psychomotor Activity:  Normal  Concentration:  Good  Recall:  Good  Fund of Knowledge:Good  Language: Good  Akathisia:  Negative  Handed:  Left  AIMS (if indicated):     Assets:  Communication Skills Desire for Improvement Resilience  ADL's:  Intact  Cognition: WNL  Sleep:  Number of Hours: 6.75  Assessment - patient has improved compared to admission. Not nauseated with celexa. Would prefer not to increase dose as of now.  Particularly during thanksgiving, feeling his grandmother is hypercritical, offensive, and feeling that he is not feeling strong enough to deal with this without likely severe worsening of depression.   Treatment Plan Summary: Daily contact with patient to assess and evaluate symptoms and progress in treatment, Medication  management, Plan inpatient treatment and medications as below  Encourage ongoing milieu participation to work on coping skills and symptom reduction Depression:   Celexa  To 20  mgrs QAM  for depression and anxiety symptoms- side effects discussed Continue Depakote ER 1250 mgrs QDAY for management of mood disorder, explosiveness  Insomnia:   Trazodone to 75 mgrs QHS for insomnia. To abstain from oversedation.  Continue to encourage full abstinence / relapse prevention efforts as part of treatment goals.   Kortnie Stovall 07/14/2015, 10:30 AM

## 2015-07-14 NOTE — Progress Notes (Signed)
Patient ID: Ryan Bartlett, male   DOB: 31-Dec-1995, 19 y.o.   MRN: [id number]   D: Pt continues to be very silly and superficial on the unit. Pt is very childlike, and requires some redirection. Pt reported that he felt better and was happy to get some sleep. Pt reported that his depression was a 8, his hopelessness was a 8, and his anxiety was a 8. Pt reported being negative SI/HI, no AH/VH noted. A: 15 min checks continued for patient safety. R: Pt safety maintained.

## 2015-07-15 MED ORDER — CITALOPRAM HYDROBROMIDE 10 MG PO TABS
30.0000 mg | ORAL_TABLET | Freq: Every day | ORAL | Status: DC
  Administered 2015-07-16 – 2015-07-20 (×4): 30 mg via ORAL
  Filled 2015-07-15: qty 3
  Filled 2015-07-15: qty 6
  Filled 2015-07-15 (×6): qty 3

## 2015-07-15 NOTE — Progress Notes (Signed)
Patient ID: Ryan Bartlett, male   DOB: 09/04/1995, 19 y.o.   MRN: [id number] [facility] MD Progress Note  07/15/2015 10:06 AM STEPHANE NIEMANN  MRN:  [id number] Subjective:   Patient states hs is feeling " kind of depressed ". States , however somewhat better since admission. He had some pain around neck when he tried to shave.   Continues to take celexa. Some improvement.  Remains somewhat down.   Objective : I have reviewed chart notes and have met with patient. As noted, reports some lingering depression and anxiety, and in particular ruminates about family dynamics, and feeling unready to face hyper critical family members. Of note, denies any violent or homicidal ideations . Patient states he is making a sustained effort to avoid gang/ criminal type behaviors, and is trying to be " a better person, a better brother". Slow improvement in mood but tolerating celexa.  No  Overtly disruptive behaviors on unit, although at times needs redirections for being loud . He does interact well with peers or about his age, and affect is reactive in these circumstances . As noted, currently denying nausea, vomiting- now off Zoloft and on Celexa      Principal Problem: Major depressive disorder, recurrent episode, moderate ([place]) Diagnosis:   Patient Active Problem List   Diagnosis Date Noted  . Major depressive disorder, recurrent episode, moderate ([place]) [F33.1] 07/09/2015  . Depression [F32.9]   . Suicidal ideation [R45.851]   . Cannabis use disorder, moderate, dependence ([place]) [F12.20] 06/06/2015  . Alcohol use disorder, moderate, dependence ([place]) [F10.20] 06/06/2015  . Bipolar I disorder, most recent episode depressed ([place]) [F31.30] 05/23/2013  . Cannabis abuse [F12.10] 03/30/2012  . ADHD (attention deficit hyperactivity disorder), combined type [F90.2] 08/28/2011  . Conduct disorder, adolescent onset type [F91.2] 08/28/2011   Total Time spent with patient:  25 minutes     Past Medical History:   Past Medical History  Diagnosis Date  . ADHD (attention deficit hyperactivity disorder)   . Unspecified episodic mood disorder   . Oppositional defiant disorder   . Bipolar disorder ([place])   . Depression     Past Surgical History  Procedure Laterality Date  . No past surgeries     Family History:  Family History  Problem Relation Age of Onset  . Depression Mother   . Diabetes Other   . Hyperlipidemia Other   . Hypertension Other     Social History:  History  Alcohol Use No    Comment: "I actually recently quit"     History  Drug Use No    Social History   Social History  . Marital Status: Single    Spouse Name: N/A  . Number of Children: N/A  . Years of Education: N/A   Occupational History  . student     12th grade at [employer] Topics  . Smoking status: Current Every Day Smoker -- 0.50 packs/day for 0 years    Types: Cigarettes  . Smokeless tobacco: Never Used  . Alcohol Use: No     Comment: "I actually recently quit"  . Drug Use: No  . Sexual Activity: Not Asked     Comment: Pt reports that he is not sexually active   Other Topics Concern  . None   Social History Narrative   Additional Social History:    Pain Medications: None Prescriptions: See PTA medication list Over the Counter: None History of alcohol / drug use?: No history of  alcohol / drug abuse Longest period of sobriety (when/how long): over a month Negative Consequences of Use: Legal Name of Substance 1: K-2 1 - Age of First Use: 19 years of age 41 - Amount (size/oz): Joint per day 1 - Frequency: 5 to 6 times in a week 1 - Duration: <1 year 1 - Last Use / Amount: A week ago.  Sleep:  Improving   Appetite: good   Current Medications: Current Facility-Administered Medications  Medication Dose Route Frequency Provider Last Rate Last Dose  . acetaminophen (TYLENOL) tablet 650 mg  650 mg Oral Q6H PRN Harriet Butte, NP   650 mg at 07/12/15 1013  . albuterol  (PROVENTIL HFA;VENTOLIN HFA) 108 (90 BASE) MCG/ACT inhaler 2 puff  2 puff Inhalation Q4H PRN Nicholaus Bloom, MD   2 puff at 07/13/15 1204  . alum & mag hydroxide-simeth (MAALOX/MYLANTA) 200-200-20 MG/5ML suspension 30 mL  30 mL Oral Q4H PRN Harriet Butte, NP   30 mL at 07/12/15 0759  . citalopram (CELEXA) tablet 20 mg  20 mg Oral Daily Jenne Campus, MD   20 mg at 07/15/15 0825  . diphenhydrAMINE (BENADRYL) capsule 25 mg  25 mg Oral PRN Nanci Pina, FNP   25 mg at 07/13/15 1818   Or  . diphenhydrAMINE (BENADRYL) injection 25 mg  25 mg Intramuscular PRN Nanci Pina, FNP      . divalproex (DEPAKOTE ER) 24 hr tablet 1,250 mg  1,250 mg Oral QHS Harriet Butte, NP   1,250 mg at 07/14/15 2232  . magnesium hydroxide (MILK OF MAGNESIA) suspension 30 mL  30 mL Oral Daily PRN Harriet Butte, NP      . nicotine polacrilex (NICORETTE) gum 2 mg  2 mg Oral PRN Jenne Campus, MD   2 mg at 07/14/15 1517  . traZODone (DESYREL) tablet 75 mg  75 mg Oral QHS Jenne Campus, MD   75 mg at 07/14/15 2232    Lab Results:  No results found for this or any previous visit (from the past 37 hour(s)).  Physical Findings: AIMS: Facial and Oral Movements Muscles of Facial Expression: None, normal Lips and Perioral Area: None, normal Jaw: None, normal Tongue: None, normal,Extremity Movements Upper (arms, wrists, hands, fingers): None, normal Lower (legs, knees, ankles, toes): None, normal, Trunk Movements Neck, shoulders, hips: None, normal, Overall Severity Severity of abnormal movements (highest score from questions above): None, normal Incapacitation due to abnormal movements: None, normal Patient's awareness of abnormal movements (rate only patient's report): No Awareness, Dental Status Current problems with teeth and/or dentures?: No Does patient usually wear dentures?: No  CIWA:  CIWA-Ar Total: 1 COWS:  COWS Total Score: 1  Musculoskeletal: Strength & Muscle Tone: within normal limits Gait  & Station: normal Patient leans: N/A  Psychiatric Specialty Exam: Review of Systems  Constitutional: Negative for fever.  Neurological: Negative for tremors.  Psychiatric/Behavioral: Positive for depression. Negative for suicidal ideas.    Nausea now improved, no vomiting, no headache, no abdominal distress or pain today  Blood pressure 128/71, pulse 76, temperature 97.8 F (36.6 C), temperature source Oral, resp. rate 17, height _0  (1.753 m), weight 104.781 kg (231 lb).Body mass index is 34.1 kg/(m^2).  General Appearance:  Improved grooming   Eye Contact::  Good  Speech:  Normal Rate  Volume:  Normal  Mood:   Reports some ongoing sadness  But improved affect.  Thought Process:  Linear  Orientation:  Full (Time, Place,  and Person)  Thought Content:  denies hallucinations, no delusions  ruminative about family dynamics, feeling some family members are hyper critical and offensive to him at times   Suicidal Thoughts:  No at this time denies any suicidal ideations or self injurious ideations  Homicidal Thoughts:  No denies any violent ideations or HI, specifically also denies any thoughts of violence towards any family member  Memory:  recent and remote grossly intact   Judgement:  Other:  improving   Insight:  improving   Psychomotor Activity:  Normal  Concentration:  Good  Recall:  Good  Fund of Knowledge:Good  Language: Good  Akathisia:  Negative  Handed:  Left  AIMS (if indicated):     Assets:  Communication Skills Desire for Improvement Resilience  ADL's:  Intact  Cognition: WNL  Sleep:  Number of Hours: 4.75  Assessment - patient has improved compared to admission. Not nauseated with celexa. Would prefer not to increase dose as of now.  Particularly during thanksgiving, feeling his grandmother is hypercritical, offensive, and feeling that he is not feeling strong enough to deal with this without likely severe worsening of depression.   Treatment Plan Summary: Daily  contact with patient to assess and evaluate symptoms and progress in treatment, Medication management, Plan inpatient treatment and medications as below  Encourage ongoing milieu participation to work on coping skills and symptom reduction Depression:   increae Celexa  To 30  mgrs QAM  for depression and anxiety symptoms- side effects discussed Continue Depakote ER 1250 mgrs QDAY for management of mood disorder, explosiveness  Insomnia:   Trazodone to 75 mgrs QHS for insomnia. To abstain from oversedation.  Continue to encourage full abstinence / relapse prevention efforts as part of treatment goals. Possible discharge in 2 days.   Ejay Lashley 07/15/2015, 10:06 AM

## 2015-07-15 NOTE — Progress Notes (Signed)
Recreation Therapy Notes  Date: 11.25.2016 Time: 9:30am Location: [address] Group Room   Group Topic: Stress Management  Goal Area(s) Addresses:  Patient will actively participate in stress management techniques presented during session.   Behavioral Response: Disengaged.    Intervention: Stress management techniques  Activity :  Deep Breathing and Tapping. LRT provided instruction and demonstration on practice of Tappin. Technique was coupled with deep breathing.   Education:  Stress Management, Discharge Planning.   Education Outcome: Acknowledges education  Clinical Observations/Feedback: Patient attended group session, but did not participate.    Marykay Lexenise L Kyheem [place], LRT/CTRS  Soleia Badolato L 07/15/2015 10:24 AM

## 2015-07-15 NOTE — [facility] Group Notes (Signed)
[address] [name]   07/15/2015 9:37 AM  Participation Quality:  Patient invited but chose to remain in room.     PICKETT Bartlett, Ryan Cinque C

## 2015-07-15 NOTE — Progress Notes (Signed)
D: Pt presents anxious and animated on approach. Pt verbalizes depression 8/10, anxiety 8/10 and hopeless 8/10. Pt endorses suicidal thoughts w/o a plan. Pt reports fair sleep and appetite. Pt compliant with meds and no adverse reaction to meds verbalized by pt. Pt c/o lump to his neck. Renata Capriceonrad, NP/., assessed pt.  A: Medications administered as ordered per MD. Verbal support given. Pt encouraged to attend groups. 15 minute checks performed for safety. Pt agrees not to harm self with Clinical research associatewriter. R: Pt receptive to tx.

## 2015-07-15 NOTE — Accreditation Note (Signed)
D. Pt had been up and visible in milieu this evening, seen interacting appropriately with peers. Pt had endorsed depression and spoke about how he continues to feel that way although he denied any SI this evening and did receive all medications without incident. A. Support and encouragement provided. R. Safety maintained, will continue to monitor.

## 2015-07-16 LAB — VALPROIC ACID LEVEL: Valproic Acid Lvl: 53 ug/mL (ref 50.0–100.0)

## 2015-07-16 MED ORDER — CEPHALEXIN 500 MG PO CAPS
500.0000 mg | ORAL_CAPSULE | Freq: Three times a day (TID) | ORAL | Status: DC
  Administered 2015-07-16 – 2015-07-20 (×12): 500 mg via ORAL
  Filled 2015-07-16: qty 1
  Filled 2015-07-16: qty 11
  Filled 2015-07-16 (×5): qty 1
  Filled 2015-07-16: qty 11
  Filled 2015-07-16 (×9): qty 1
  Filled 2015-07-16: qty 11
  Filled 2015-07-16 (×4): qty 1

## 2015-07-16 MED ORDER — TRAZODONE HCL 100 MG PO TABS
100.0000 mg | ORAL_TABLET | Freq: Every day | ORAL | Status: DC
  Administered 2015-07-16 – 2015-07-17 (×2): 100 mg via ORAL
  Filled 2015-07-16 (×4): qty 1

## 2015-07-16 MED ORDER — IBUPROFEN 600 MG PO TABS
600.0000 mg | ORAL_TABLET | Freq: Four times a day (QID) | ORAL | Status: DC | PRN
Start: 2015-07-16 — End: 2015-07-20
  Administered 2015-07-16 – 2015-07-18 (×3): 600 mg via ORAL
  Filled 2015-07-16 (×3): qty 1

## 2015-07-16 NOTE — Progress Notes (Signed)
Patient ID: JQUAN EGELSTON, male   DOB: 06-28-1996, 19 y.o.   MRN: [id number] [facility] ([facility] MD Progress Note  07/16/2015  TALEN POSER  MRN:  [id number] Subjective:   Patient states: "I'm still kinda down and I don't know what I would do if I left here today. I would probably act on my suicidal thoughts."   Objective: Pt seen and chart reviewed. Pt is alert/oriented x4, calm, cooperative, and appropriate to situation. Pt denies homicidal ideation and psychosis and does not appear to be responding to internal stimuli. Pt continues to affirm suicida ideation without plan/intent at this time but cannot contract for safety if discharged. Cites very poor sleep, but good appetite.    Principal Problem: Major depressive disorder, recurrent episode, moderate (HCC) Diagnosis:   Patient Active Problem List   Diagnosis Date Noted  . Major depressive disorder, recurrent episode, moderate (HCC) [F33.1] 07/09/2015  . Depression [F32.9]   . Suicidal ideation [R45.851]   . Cannabis use disorder, moderate, dependence (HCC) [F12.20] 06/06/2015  . Alcohol use disorder, moderate, dependence (HCC) [F10.20] 06/06/2015  . Bipolar I disorder, most recent episode depressed (HCC) [F31.30] 05/23/2013  . Cannabis abuse [F12.10] 03/30/2012  . ADHD (attention deficit hyperactivity disorder), combined type [F90.2] 08/28/2011  . Conduct disorder, adolescent onset type [F91.2] 08/28/2011   Total Time spent with patient:  25 minutes     Past Medical History:  Past Medical History  Diagnosis Date  . ADHD (attention deficit hyperactivity disorder)   . Unspecified episodic mood disorder   . Oppositional defiant disorder   . Bipolar disorder (HCC)   . Depression     Past Surgical History  Procedure Laterality Date  . No past surgeries     Family History:  Family History  Problem Relation Age of Onset  . Depression Mother   . Diabetes Other   . Hyperlipidemia Other   . Hypertension Other     Social History:   History  Alcohol Use No    Comment: "I actually recently quit"     History  Drug Use No    Social History   Social History  . Marital Status: Single    Spouse Name: N/A  . Number of Children: N/A  . Years of Education: N/A   Occupational History  . student     12th grade at [employer]   Social History Main Topics  . Smoking status: Current Every Day Smoker -- 0.50 packs/day for 0 years    Types: Cigarettes  . Smokeless tobacco: Never Used  . Alcohol Use: No     Comment: "I actually recently quit"  . Drug Use: No  . Sexual Activity: Not Asked     Comment: Pt reports that he is not sexually active   Other Topics Concern  . None   Social History Narrative   Additional Social History:    Pain Medications: None Prescriptions: See PTA medication list Over the Counter: None History of alcohol / drug use?: No history of alcohol / drug abuse Longest period of sobriety (when/how long): over a month Negative Consequences of Use: Legal Name of Substance 1: K-2 1 - Age of First Use: 19 years of age 60 - Amount (size/oz): Joint per day 1 - Frequency: 5 to 6 times in a week 1 - Duration: <1 year 1 - Last Use / Amount: A week ago.  Sleep:  Improving   Appetite: good   Current Medications: Current Facility-Administered Medications  Medication Dose Route Frequency  Provider Last Rate Last Dose  . acetaminophen (TYLENOL) tablet 650 mg  650 mg Oral Q6H PRN Worthy Flank, NP   650 mg at 07/12/15 1013  . albuterol (PROVENTIL HFA;VENTOLIN HFA) 108 (90 BASE) MCG/ACT inhaler 2 puff  2 puff Inhalation Q4H PRN Rachael Fee, MD   2 puff at 07/15/15 1352  . alum & mag hydroxide-simeth (MAALOX/MYLANTA) 200-200-20 MG/5ML suspension 30 mL  30 mL Oral Q4H PRN Worthy Flank, NP   30 mL at 07/12/15 0759  . cephALEXin (KEFLEX) capsule 500 mg  500 mg Oral 3 times per day Beau Fanny, FNP   500 mg at 07/16/15 1307  . citalopram (CELEXA) tablet 30 mg  30 mg Oral Daily Thresa Ross, MD    30 mg at 07/16/15 0844  . diphenhydrAMINE (BENADRYL) capsule 25 mg  25 mg Oral PRN Truman Hayward, FNP   25 mg at 07/13/15 1818   Or  . diphenhydrAMINE (BENADRYL) injection 25 mg  25 mg Intramuscular PRN Truman Hayward, FNP      . divalproex (DEPAKOTE ER) 24 hr tablet 1,250 mg  1,250 mg Oral QHS Worthy Flank, NP   1,250 mg at 07/15/15 2240  . magnesium hydroxide (MILK OF MAGNESIA) suspension 30 mL  30 mL Oral Daily PRN Worthy Flank, NP      . nicotine polacrilex (NICORETTE) gum 2 mg  2 mg Oral PRN Craige Cotta, MD   2 mg at 07/16/15 1307  . traZODone (DESYREL) tablet 100 mg  100 mg Oral QHS Beau Fanny, FNP        Lab Results:  No results found for this or any previous visit (from the past 48 hour(s)).  Physical Findings: AIMS: Facial and Oral Movements Muscles of Facial Expression: None, normal Lips and Perioral Area: None, normal Jaw: None, normal Tongue: None, normal,Extremity Movements Upper (arms, wrists, hands, fingers): None, normal Lower (legs, knees, ankles, toes): None, normal, Trunk Movements Neck, shoulders, hips: None, normal, Overall Severity Severity of abnormal movements (highest score from questions above): None, normal Incapacitation due to abnormal movements: None, normal Patient's awareness of abnormal movements (rate only patient's report): No Awareness, Dental Status Current problems with teeth and/or dentures?: No Does patient usually wear dentures?: No  CIWA:  CIWA-Ar Total: 1 COWS:  COWS Total Score: 1  Musculoskeletal: Strength & Muscle Tone: within normal limits Gait & Station: normal Patient leans: N/A  Psychiatric Specialty Exam: Review of Systems  Constitutional: Negative for fever.  Neurological: Negative for tremors.  Psychiatric/Behavioral: Positive for depression. Negative for suicidal ideas. The patient is nervous/anxious and has insomnia.   All other systems reviewed and are negative.   Nausea now improved, no vomiting, no  headache, no abdominal distress or pain today  Blood pressure 120/73, pulse 74, temperature 98 F (36.7 C), temperature source Oral, resp. rate 16, height  (1.753 m), weight 104.781 kg (231 lb).Body mass index is 34.1 kg/(m^2).  General Appearance: Casual, fairly groomed   Eye Contact::  Good  Speech:  Normal Rate  Volume:  Normal  Mood:   Reports some ongoing sadness  But improved affect.  Thought Process:  Linear  Orientation:  Full (Time, Place, and Person)  Thought Content:  denies hallucinations, no delusions  ruminative about family dynamics, feeling some family members are hyper critical and offensive to him at times   Suicidal Thoughts:  No at this time denies any suicidal ideations or self injurious ideations  Homicidal  Thoughts:  No denies any violent ideations or HI, specifically also denies any thoughts of violence towards any family member  Memory:  recent and remote grossly intact   Judgement:  Other:  improving   Insight:  improving   Psychomotor Activity:  Normal  Concentration:  Good  Recall:  Good  Fund of Knowledge:Good  Language: Good  Akathisia:  Negative  Handed:  Left  AIMS (if indicated):     Assets:  Communication Skills Desire for Improvement Resilience  ADL's:  Intact  Cognition: WNL  Sleep:  Number of Hours: 6   Assessment - patient has improved compared to admission. Not nauseated with celexa. Particularly during thanksgiving, feeling his grandmother is hypercritical, offensive, and feeling that he is not feeling strong enough to deal with this without likely severe worsening of depression. Today on 07/16/15, pt presents as depressed, although intermittently enthusiastic and upbeat interacting with other patients. Sleep is poor.   Treatment Plan Summary: Daily contact with patient to assess and evaluate symptoms and progress in treatment, Medication management, Plan inpatient treatment and medications as below   Medications:  -Continue Celexa  30mg  daily for MDD -Continue Depakote 1250mg  daily for mood stabilization -Increase Trazodone to 100mg  qhs prn insomnia  Labs/Tests/Other: -Depakote level this PM to consider upping the dose -Encourage ongoing milieu participation to work on Pharmacologistcoping skills and symptom reduction -Continue to encourage full abstinence / relapse prevention efforts as part of treatment goals.  Beau FannyWithrow, John C, FNP-BC 07/16/2015, 09:28AM I agree with assessment and plan Madie Renorving A. Dub MikesLugo, M.D.

## 2015-07-16 NOTE — [facility] Group Notes (Signed)
[facility] Group Notes:  (Nursing/MHT/Case Management/Adjunct)  Date:  07/16/2015  Time:  11:02 AM  Type of Therapy:  Psychoeducational Skills  Participation Level:  Did Not Attend  Participation Quality:  Did Not Attend  Affect:  Did Not Attend  Cognitive:  Did Not Attend  Insight:  None  Engagement in Group:  Did Not Attend  Modes of Intervention:  Did Not Attend  Summary of Progress/Problems: Pt did not attend patient self inventory group.    Jacquelyne BalintForrest, Amilya Haver Shanta 07/16/2015, 11:02 AM

## 2015-07-16 NOTE — Progress Notes (Signed)
D: Ryan Bartlett is very animated and childlike in his behavior on the unit. He laughs constantly and at the most inappropriate conversations. We have a long discussion regarding his behavior and he spoke with me regarding his absent alcoholic father, his mother turning him over to foster care as a teenager when he could not get along with her boyfriend that she moved into her home and having gone to jail as a teenager as well. He states his mother has given up on him, he is unemployed with a felony and did not complete high school. He is not motivated to do anything other than sit at the house all day and watch tv or sleep. Encouraged Ryan Bartlett to start thinking about things he can do with his life to not feel so discouraged with his current situation.  A: Encouragement and support given.  R: Continue to monitor for patient safety and medication effectiveness.

## 2015-07-16 NOTE — Progress Notes (Signed)
[name] remains easily distractible, easioy excitable but allows staff  To redirect when the situation presents itself. He is concerned about a cyst on his neck, the NP assessed and he is started on po keflex q 6 hr for this. He is tolerating keflex without diff thus far. HE completed his morning assessment and on it he wrote he deneid si and he rated his depression, hopelessness and anxiety " 8/8/8", respectively.   A He was attentive in his Life Skills group today and he is working on his unhealthy behaviors and admits that its 'easy to fall back into old behaviors". He struggles with goal-setting, unrealistic expectations and staying focused on his goals. This staff offered pos reinforcement when he process his feelings he responds pos to pos reinforcement.   R Safety is in place.
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
## 2015-07-17 NOTE — Progress Notes (Signed)
Adult Psychoeducational Group Note  Date:  07/17/2015 Time:  9:41 PM  Group Topic/Focus:  Wrap-Up Group:   The focus of this group is to help patients review their daily goal of treatment and discuss progress on daily workbooks.  Participation Level:  Active  Participation Quality:  Appropriate and Attentive  Affect:  Appropriate  Cognitive:  Alert, Appropriate and Oriented  Insight: Appropriate and Good  Engagement in Group:  Engaged and Limited  Modes of Intervention:  Discussion and Support  Additional Comments:  Pt said "I had a rocky day, I felt ill and I've slept most of the day. Pt rates his day 3/10. Pt said that his raidars won their football game which was something positive that happened today.   Glorious Peachyesha N Davison Ohms 07/17/2015, 9:41 PM

## 2015-07-17 NOTE — Progress Notes (Signed)
Writer spoke with patient 1:1 and he reports having had a good day. He has been observed back and forth in the dayroom or nursing station, laughing, talking with peers. Writer asked if he had plans for discharge and he reports that he is not sure what he will be doing once discharged. He reports that his mom is his support system but feels that she is not very supportive lately. Writer informed him of his scheduled medications .He reports having thoughts of suicide just a little today but not much. He denies hi/a/v hallucinations. Support and encouragement given, safety maintained on unit with 15 min checks.

## 2015-07-17 NOTE — [facility] Group Notes (Signed)
[facility] Group Notes:  (Nursing/MHT/Case Management/Adjunct)  Date:  07/17/2015  Time:  11:05 AM  Type of Therapy:  Psychoeducational Skills  Participation Level:  Did Not Attend  Participation Quality:  Did Not Attend  Affect:  Did Not Attend  Cognitive:  Did Not Attend  Insight:  None  Engagement in Group:  Did Not Attend  Modes of Intervention:  Did Not Attend  Summary of Progress/Problems: Pt did not attend patient self inventory group.   Jacquelyne BalintForrest, Albie Arizpe Shanta 07/17/2015, 11:05 AM

## 2015-07-17 NOTE — Progress Notes (Signed)
Patient ID: Ryan Bartlett, male   DOB: [birth year], 19 y.o.   MRN: [id number] Patient ID: Ryan Bartlett, male   DOB: [birth year], 19 y.o.   MRN: [id number] [facility] MD Progress Note  07/17/2015  Ryan Bartlett  MRN:  [id number] Subjective:   Patient states: "This was rough for me. People were talking about other people. They get on my last nerves. I try to ignore them, then I get irritated. I'm not sleeping at night"  Objective: Pt seen and chart reviewed. Pt is alert/oriented x4, calm, cooperative, and appropriate to situation. Pt denies homicidal ideation and psychosis and does not appear to be responding to internal stimuli. Pt continues to affirm suicida ideation without plan/intent at this time but cannot contract for safety if discharged. Cites very poor sleep, but good appetite. Says he is a gang member which the gang activities has rendered him jail times, felony charge & probation. He says he feels stuck as he does not know how to turn in his bandana or have his family not be-little him because of his gang affiliation. He seldom participates in group milieu. He appears to be in no apparent distress physically at this time.  Principal Problem: Major depressive disorder, recurrent episode, moderate (HCC) Diagnosis:   Patient Active Problem List   Diagnosis Date Noted  . Major depressive disorder, recurrent episode, moderate (HCC) [F33.1] 07/09/2015  . Depression [F32.9]   . Suicidal ideation [R45.851]   . Cannabis use disorder, moderate, dependence (HCC) [F12.20] 06/06/2015  . Alcohol use disorder, moderate, dependence (HCC) [F10.20] 06/06/2015  . Bipolar I disorder, most recent episode depressed (HCC) [F31.30] 05/23/2013  . Cannabis abuse [F12.10] 03/30/2012  . ADHD (attention deficit hyperactivity disorder), combined type [F90.2] 08/28/2011  . Conduct disorder, adolescent onset type [F91.2] 08/28/2011   Total Time spent with patient:  15 minutes   Past Medical History:  Past Medical  History  Diagnosis Date  . ADHD (attention deficit hyperactivity disorder)   . Unspecified episodic mood disorder   . Oppositional defiant disorder   . Bipolar disorder (HCC)   . Depression     Past Surgical History  Procedure Laterality Date  . No past surgeries     Family History:  Family History  Problem Relation Age of Onset  . Depression Mother   . Diabetes Other   . Hyperlipidemia Other   . Hypertension Other     Social History:  History  Alcohol Use No    Comment: "I actually recently quit"     History  Drug Use No    Social History   Social History  . Marital Status: Single    Spouse Name: N/A  . Number of Children: N/A  . Years of Education: N/A   Occupational History  . student     12th grade at [employer]   Social History Main Topics  . Smoking status: Current Every Day Smoker -- 0.50 packs/day for 0 years    Types: Cigarettes  . Smokeless tobacco: Never Used  . Alcohol Use: No     Comment: "I actually recently quit"  . Drug Use: No  . Sexual Activity: Not Asked     Comment: Pt reports that he is not sexually active   Other Topics Concern  . None   Social History Narrative   Additional Social History:    Pain Medications: None Prescriptions: See PTA medication list Over the Counter: None History of alcohol / drug use?: No history of alcohol /  drug abuse Longest period of sobriety (when/how long): over a month Negative Consequences of Use: Legal Name of Substance 1: K-2 1 - Age of First Use: 19 years of age 32 - Amount (size/oz): Joint per day 1 - Frequency: 5 to 6 times in a week 1 - Duration: <1 year 1 - Last Use / Amount: A week ago.  Sleep:  Improving   Appetite: good   Current Medications: Current Facility-Administered Medications  Medication Dose Route Frequency Provider Last Rate Last Dose  . acetaminophen (TYLENOL) tablet 650 mg  650 mg Oral Q6H PRN Worthy Flank, NP   650 mg at 07/16/15 1351  . albuterol (PROVENTIL  HFA;VENTOLIN HFA) 108 (90 BASE) MCG/ACT inhaler 2 puff  2 puff Inhalation Q4H PRN Rachael Fee, MD   2 puff at 07/15/15 1352  . alum & mag hydroxide-simeth (MAALOX/MYLANTA) 200-200-20 MG/5ML suspension 30 mL  30 mL Oral Q4H PRN Worthy Flank, NP   30 mL at 07/12/15 0759  . cephALEXin (KEFLEX) capsule 500 mg  500 mg Oral 3 times per day Beau Fanny, FNP   500 mg at 07/17/15 1247  . citalopram (CELEXA) tablet 30 mg  30 mg Oral Daily Thresa Ross, MD   30 mg at 07/17/15 1130  . diphenhydrAMINE (BENADRYL) capsule 25 mg  25 mg Oral PRN Truman Hayward, FNP   25 mg at 07/16/15 2219   Or  . diphenhydrAMINE (BENADRYL) injection 25 mg  25 mg Intramuscular PRN Truman Hayward, FNP      . divalproex (DEPAKOTE ER) 24 hr tablet 1,250 mg  1,250 mg Oral QHS Worthy Flank, NP   1,250 mg at 07/16/15 2219  . ibuprofen (ADVIL,MOTRIN) tablet 600 mg  600 mg Oral Q6H PRN Kristeen Mans, NP   600 mg at 07/17/15 1131  . magnesium hydroxide (MILK OF MAGNESIA) suspension 30 mL  30 mL Oral Daily PRN Worthy Flank, NP      . nicotine polacrilex (NICORETTE) gum 2 mg  2 mg Oral PRN Craige Cotta, MD   2 mg at 07/17/15 1344  . traZODone (DESYREL) tablet 100 mg  100 mg Oral QHS Beau Fanny, FNP   100 mg at 07/16/15 2219   Lab Results:  Results for orders placed or performed during the hospital encounter of 07/09/15 (from the past 48 hour(s))  Valproic acid level     Status: None   Collection Time: 07/16/15  6:15 PM  Result Value Ref Range   Valproic Acid Lvl 53 50.0 - 100.0 ug/mL    Comment: Performed at [facility]   Physical Findings:  AIMS: Facial and Oral Movements Muscles of Facial Expression: None, normal Lips and Perioral Area: None, normal Jaw: None, normal Tongue: None, normal,Extremity Movements Upper (arms, wrists, hands, fingers): None, normal Lower (legs, knees, ankles, toes): None, normal, Trunk Movements Neck, shoulders, hips: None, normal, Overall Severity Severity  of abnormal movements (highest score from questions above): None, normal Incapacitation due to abnormal movements: None, normal Patient's awareness of abnormal movements (rate only patient's report): No Awareness, Dental Status Current problems with teeth and/or dentures?: No Does patient usually wear dentures?: No  CIWA:  CIWA-Ar Total: 3 COWS:  COWS Total Score: 1  Musculoskeletal: Strength & Muscle Tone: within normal limits Gait & Station: normal Patient leans: N/A  Psychiatric Specialty Exam: Review of Systems  Constitutional: Negative for fever.  Neurological: Negative for tremors.  Psychiatric/Behavioral: Positive for depression. Negative for  suicidal ideas. The patient is nervous/anxious and has insomnia.   All other systems reviewed and are negative.   Nausea now improved, no vomiting, no headache, no abdominal distress or pain today  Blood pressure 131/68, pulse 76, temperature 97.5 F (36.4 C), temperature source Oral, resp. rate 17, height 5\' 9"  (1.753 m), weight 104.781 kg (231 lb).Body mass index is 34.1 kg/(m^2).  General Appearance: Casual, fairly groomed   Eye Contact::  Good  Speech:  Normal Rate  Volume:  Normal  Mood:   Reports some ongoing sadness  But improved affect.  Thought Process:  Linear  Orientation:  Full (Time, Place, and Person)  Thought Content:  denies hallucinations, no delusions  ruminative about family dynamics, feeling some family members are hyper critical and offensive to him at times   Suicidal Thoughts:  No at this time denies any suicidal ideations or self injurious ideations  Homicidal Thoughts:  No denies any violent ideations or HI, specifically also denies any thoughts of violence towards any family member  Memory:  recent and remote grossly intact   Judgement:  Other:  improving   Insight:  improving   Psychomotor Activity:  Normal  Concentration:  Good  Recall:  Good  Fund of Knowledge:Good  Language: Good  Akathisia:  Negative   Handed:  Left  AIMS (if indicated):     Assets:  Communication Skills Desire for Improvement Resilience  ADL's:  Intact  Cognition: WNL  Sleep:  Number of Hours: 6.15   Assessment - patient has improved compared to admission. Not nauseated with celexa. Particularly during thanksgiving, feeling his grandmother is hypercritical, offensive, and feeling that he is not feeling strong enough to deal with this without likely severe worsening of depression. Today on 07/16/15, pt presents as depressed, although intermittently enthusiastic and upbeat interacting with other patients. Sleep is poor, he stated. Will adjust Trazodone dose to 150 mg. Patient aware.  Treatment Plan Summary: Daily contact with patient to assess and evaluate symptoms and progress in treatment, Medication management, Plan inpatient treatment and medications as below  -Continue Celexa 30mg  daily for MDD -Continue Depakote 1250mg  daily for mood stabilization -Increase Trazodone to 150mg  qhs prn insomnia. Continue to group participation. Discharge plan in progress per social worker disposition. -Depakote level result reviewed, at 4153. -Encouraged ongoing milieu participation to work on coping skills and symptom reduction -Continue to encourage full abstinence / relapse prevention efforts as part of treatment goals.  Sanjuana KavaNwoko, Agnes I, PMHNP, FNP-BC 07/17/2015, 09:28AM I agree with assessment and plan Madie Renorving A. Dub MikesLugo, M.D.

## 2015-07-17 NOTE — [facility] Group Notes (Signed)
[facility] Group Notes:  (Clinical Social Work)   07/17/2015  1:15-2:15PM  Summary of Progress/Problems:   The main focus of today's process group was to   1)  discuss the importance of adding supports  2)  define health supports versus unhealthy supports  3)  identify the patient's current unhealthy supports and plan how to handle them  4)  Identify the patient's current healthy supports and plan what to add.  An emphasis was placed on using counselor, doctor, therapy groups, 12-step groups, and problem-specific support groups to expand supports.    The patient became angry at the beginning of group when group rules were being set, stating that it should not be a rule that there should be no cursing, because that is the only way he can express himself.  CSW shared that it was offensive to some people, and we all needed to express ourselves in an inoffensive manner.  He and several other patients argued vehemently against this, and then he angrily left the room.  He returned later and participated fully in the discussion, but was monopolizing.  He moved from one chair to another, stating he was sorry "but it's my ADD."  Later he requested a one-to-one meeting with CSW, and during that meeting was able to sit still for a long period of time.  Type of Therapy:  Process Group with Motivational Interviewing  Participation Level:  Active  Participation Quality:  Intrusive, Monopolizing, Resistant and left for awhile after being told could not curse  Affect:  Blunted, Defensive, Irritable and Resistant  Cognitive:  Oriented  Insight:  Limited  Engagement in Therapy:  Limited  Modes of Intervention:   Education, Support and Processing, Activity  Ambrose MantleMareida Grossman-Orr, [name] 07/17/2015   4:59 PM

## 2015-07-17 NOTE — Progress Notes (Signed)
D: Ryan Bartlett states he was very upset in regards to the disruption that took place earlier today during group. He felt as if he was being singled out several times during the discussion and also that some of the things he had confided in the [name] about were brought up as blanket topics during group. He also felt he was disrespected by one of the other patients on the unit in and that is when an argument ensued. We discussed more effective ways to channel his anger such as communicating more and assuming less. He rates anxiety 7/10. Denies SI/HI/AVH. Contracts for safety.  A: Encouragement and support given.  R: Continue to monitor for patient safety and medication effectiveness.

## 2015-07-17 NOTE — Progress Notes (Signed)
Patient ID: Ryan Bartlett, male   DOB: August 11, 1996, 19 y.o.   MRN: [id number]   D: Pt continues to be very silly and superficial on the unit. Pt is very childlike, and requires some redirection. Pt also requires frequent redirection due to the use of profanity, pt does respond to redirection. Pt reported that his depression was a 8, his hopelessness was a 8, and his anxiety was a 8. Pt reported being negative SI/HI, no AH/VH noted. A: 15 min checks continued for patient safety. R: Pt safety maintained.

## 2015-07-18 MED ORDER — QUETIAPINE FUMARATE 50 MG PO TABS
50.0000 mg | ORAL_TABLET | Freq: Every day | ORAL | Status: DC
  Administered 2015-07-18 – 2015-07-19 (×2): 50 mg via ORAL
  Filled 2015-07-18: qty 1
  Filled 2015-07-18: qty 2
  Filled 2015-07-18 (×3): qty 1

## 2015-07-18 MED ORDER — DIVALPROEX SODIUM ER 500 MG PO TB24
1500.0000 mg | ORAL_TABLET | Freq: Every day | ORAL | Status: DC
  Administered 2015-07-18 – 2015-07-19 (×2): 1500 mg via ORAL
  Filled 2015-07-18 (×3): qty 3
  Filled 2015-07-18: qty 6
  Filled 2015-07-18: qty 3

## 2015-07-18 NOTE — Progress Notes (Signed)
Adult Psychoeducational Group Note  Date:  07/18/2015 Time:  9:34 PM  Group Topic/Focus:  Wrap-Up Group:   The focus of this group is to help patients review their daily goal of treatment and discuss progress on daily workbooks.  Participation Level:  Active  Participation Quality:  Appropriate and Attentive  Affect:  Appropriate  Cognitive:  Appropriate  Insight: Appropriate and Good  Engagement in Group:  Engaged  Modes of Intervention:  Education  Additional Comments:  Pt had a good day. Pt said he have not had a goal set for the past few days. This Airline pilotwriter set a goal for the patient to work on 5 coping skills to help with his anger. This Clinical research associatewriter provided patient with a list of coping skills.   Merlinda FrederickKeshia S Stormey Wilborn 07/18/2015, 9:34 PM

## 2015-07-18 NOTE — [facility] Group Notes (Signed)
[name] Note  07/18/2015 8:45 AM  Pt did not attend, declined invitation.   Chad CordialLauren Carter, LCSWA 07/18/2015 9:33 AM

## 2015-07-18 NOTE — Progress Notes (Signed)
D: Pt presents anxious on approach and animated. Pt intrusive and attention seeking. Pt laughing and joking around this morning with staff and other pts. Pt do not appear depressed this morning. Pt rates depression 8/10. Anxiety 8/10. Hopeless 8/10. Pt endorses suicidal thoughts with a plan to hang himself. Pt denies any stressors. Pt reports poor sleep d/t having racing thoughts at bedtime. Pt states that trazodone is not effective for sleep. Pt c/o back pain and requested Ibuprofen.  A: Medications administered as ordered per MD. Verbal support given. Pt encouraged to attend groups. 15 minute checks performed for safety. Pt agrees to remaining free of self harm behaviors.  R: Pt receptive to tx.

## 2015-07-18 NOTE — [facility] Group Notes (Signed)
[facility] [name] Group Therapy  07/18/2015 1:15pm  Type of Therapy:  Group Therapy vercoming Obstacles  Pt did not attend, declined invitation.   Chad CordialLauren Carter, LCSWA 07/18/2015 3:02 PM

## 2015-07-18 NOTE — Progress Notes (Signed)
Patient ID: Maureen Chattersustin T Sevigny, male   DOB: 02/24/1996, 19 y.o.   MRN: [id number] [facility]  07/18/2015  Maureen Chattersustin T Camire  MRN:  [id number] Subjective:   Patient  Reports he continues to feel " tired", depressed, anxious.  He complains of ongoing insomnia. He does not endorse medication side effects, except for occasional " twitches" . He  States he continues to have chronic suicidal ideations, although with no specific plan or intent. States he has been having these  Thoughts " for a long time, years ".   Objective:  Patient case discussed with treatment team and patient seen. He reports, as above, ongoing depression, sadness, and ongoing suicidal ideations, which he reports as chronic. He contracts for safety on the unit, and has not exhibited any self injurious behaviors on unit. He endorses ongoing sense of anhedonia, low self esteem, states " I feel worthless ". As above, describes occasional muscular spasms/ " twitches", but none are noted  At this time- no distal tremors, no stiffness or cogwheeling, no akathisia. Insomnia is an ongoing issue for patient. States " whatever I am getting for sleep now is not really working ". Of note, as discussed with staff, reviewed in notes, patient's affect is noted to be reactive, and is noted to be smiling , laughing in groups, when interacting with peers . In his interactions with staff tends to exhibit a more constricted affect. Has needed some redirection from staff  Due to childlike, attention seeking behavior, but no overtly agitated or disruptive behaviors reported .    Principal Problem: Major depressive disorder, recurrent episode, moderate (HCC) Diagnosis:   Patient Active Problem List   Diagnosis Date Noted  . Major depressive disorder, recurrent episode, moderate (HCC) [F33.1] 07/09/2015  . Depression [F32.9]   . Suicidal ideation [R45.851]   . Cannabis use disorder, moderate, dependence (HCC) [F12.20] 06/06/2015  . Alcohol use  disorder, moderate, dependence (HCC) [F10.20] 06/06/2015  . Bipolar I disorder, most recent episode depressed (HCC) [F31.30] 05/23/2013  . Cannabis abuse [F12.10] 03/30/2012  . ADHD (attention deficit hyperactivity disorder), combined type [F90.2] 08/28/2011  . Conduct disorder, adolescent onset type [F91.2] 08/28/2011   Total Time spent with patient:  15 minutes   Past Medical History:  Past Medical History  Diagnosis Date  . ADHD (attention deficit hyperactivity disorder)   . Unspecified episodic mood disorder   . Oppositional defiant disorder   . Bipolar disorder (HCC)   . Depression     Past Surgical History  Procedure Laterality Date  . No past surgeries     Family History:  Family History  Problem Relation Age of Onset  . Depression Mother   . Diabetes Other   . Hyperlipidemia Other   . Hypertension Other     Social History:  History  Alcohol Use No    Comment: "I actually recently quit"     History  Drug Use No    Social History   Social History  . Marital Status: Single    Spouse Name: N/A  . Number of Children: N/A  . Years of Education: N/A   Occupational History  . student     12th grade at [employer]   Social History Main Topics  . Smoking status: Current Every Day Smoker -- 0.50 packs/day for 0 years    Types: Cigarettes  . Smokeless tobacco: Never Used  . Alcohol Use: No     Comment: "I actually recently quit"  . Drug Use:  No  . Sexual Activity: Not Asked     Comment: Pt reports that he is not sexually active   Other Topics Concern  . None   Social History Narrative   Additional Social History:    Pain Medications: None Prescriptions: See PTA medication list Over the Counter: None History of alcohol / drug use?: No history of alcohol / drug abuse Longest period of sobriety (when/how long): over a month Negative Consequences of Use: Legal Name of Substance 1: K-2 1 - Age of First Use: 19 years of age 44 - Amount (size/oz): Joint per  day 1 - Frequency: 5 to 6 times in a week 1 - Duration: <1 year 1 - Last Use / Amount: A week ago.  Sleep:   States not sleeping well .  Appetite: good   Current Medications: Current Facility-Administered Medications  Medication Dose Route Frequency Provider Last Rate Last Dose  . acetaminophen (TYLENOL) tablet 650 mg  650 mg Oral Q6H PRN Worthy Flank, NP   650 mg at 07/16/15 1351  . albuterol (PROVENTIL HFA;VENTOLIN HFA) 108 (90 BASE) MCG/ACT inhaler 2 puff  2 puff Inhalation Q4H PRN Rachael Fee, MD   2 puff at 07/15/15 1352  . alum & mag hydroxide-simeth (MAALOX/MYLANTA) 200-200-20 MG/5ML suspension 30 mL  30 mL Oral Q4H PRN Worthy Flank, NP   30 mL at 07/12/15 0759  . cephALEXin (KEFLEX) capsule 500 mg  500 mg Oral 3 times per day Beau Fanny, FNP   500 mg at 07/18/15 1610  . citalopram (CELEXA) tablet 30 mg  30 mg Oral Daily Thresa Ross, MD   30 mg at 07/18/15 0817  . diphenhydrAMINE (BENADRYL) capsule 25 mg  25 mg Oral PRN Truman Hayward, FNP   25 mg at 07/16/15 2219   Or  . diphenhydrAMINE (BENADRYL) injection 25 mg  25 mg Intramuscular PRN Truman Hayward, FNP      . divalproex (DEPAKOTE ER) 24 hr tablet 1,250 mg  1,250 mg Oral QHS Worthy Flank, NP   1,250 mg at 07/17/15 2223  . ibuprofen (ADVIL,MOTRIN) tablet 600 mg  600 mg Oral Q6H PRN Kristeen Mans, NP   600 mg at 07/18/15 0959  . magnesium hydroxide (MILK OF MAGNESIA) suspension 30 mL  30 mL Oral Daily PRN Worthy Flank, NP      . nicotine polacrilex (NICORETTE) gum 2 mg  2 mg Oral PRN Craige Cotta, MD   2 mg at 07/17/15 1928  . traZODone (DESYREL) tablet 100 mg  100 mg Oral QHS Beau Fanny, FNP   100 mg at 07/17/15 2223   Lab Results:  Results for orders placed or performed during the hospital encounter of 07/09/15 (from the past 48 hour(s))  Valproic acid level     Status: None   Collection Time: 07/16/15  6:15 PM  Result Value Ref Range   Valproic Acid Lvl 53 50.0 - 100.0 ug/mL    Comment:  Performed at [facility]   Physical Findings:  AIMS: Facial and Oral Movements Muscles of Facial Expression: None, normal Lips and Perioral Area: None, normal Jaw: None, normal Tongue: None, normal,Extremity Movements Upper (arms, wrists, hands, fingers): None, normal Lower (legs, knees, ankles, toes): None, normal, Trunk Movements Neck, shoulders, hips: None, normal, Overall Severity Severity of abnormal movements (highest score from questions above): None, normal Incapacitation due to abnormal movements: None, normal Patient's awareness of abnormal movements (rate only patient's report): No  Awareness, Dental Status Current problems with teeth and/or dentures?: No Does patient usually wear dentures?: No  CIWA:  CIWA-Ar Total: 3 COWS:  COWS Total Score: 1  Musculoskeletal: Strength & Muscle Tone: within normal limits Gait & Station: normal Patient leans: N/A  Psychiatric Specialty Exam: Review of Systems  Constitutional: Negative for fever.  Neurological: Negative for tremors.  Psychiatric/Behavioral: Positive for depression. Negative for suicidal ideas. The patient is nervous/anxious and has insomnia.   All other systems reviewed and are negative.   Nausea now improved, no vomiting, no headache, no abdominal distress or pain today  Blood pressure 153/92, pulse 81, temperature 97.5 F (36.4 C), temperature source Oral, resp. rate 17, height  (1.753 m), weight 231 lb (104.781 kg).Body mass index is 34.1 kg/(m^2).  General Appearance:  fairly groomed   Patent attorney::  Good  Speech:  Normal Rate  Volume:  Normal  Mood:   Reports  Ongoing depression  Affect:  Variable, at times constricted  Thought Process:  Linear  Orientation:  Full (Time, Place, and Person)  Thought Content:  denies hallucinations, no delusions     Suicidal Thoughts:  Describes chronic suicidal ideations- passive,  But has had prior  thoughts of hanging self , able to contract for  safety on the unit at present .   Homicidal Thoughts:  No denies   Memory:  recent and remote grossly intact   Judgement:   Fair   Insight:   Fair   Psychomotor Activity:  Normal  Concentration:  Good  Recall:  Good  Fund of Knowledge:Good  Language: Good  Akathisia:  Negative  Handed:  Left  AIMS (if indicated):     Assets:  Communication Skills Desire for Improvement Resilience  ADL's:  Intact  Cognition: WNL  Sleep:  Number of Hours: 6.75   Assessment - Patient reports ongoing depression, sadness, sense of low self esteem. Presents with a constricted affect. As noted, staff has reported that patient's affect normalizes and is more reactive, brighter, when interacting with peers . Patient reports chronic suicidal ideations, currently passive. He states he has had thoughts of hanging self in the past, but currently contracts for safety on unit. He states he feels apprehensive about discharging as he fears his mood would worsen without the structure of inpatient unit . No medication side effects , states not sleeping well in spite of Trazodone dose titration.   Treatment Plan Summary: Daily contact with patient to assess and evaluate symptoms and progress in treatment, Medication management, Plan inpatient treatment and medications as below  -Continue Celexa   QDAY  for depression and anxiety -Increase Depakote ER to 1500 mgrs QHS mg daily for mood stabilization ( serum level low therapeutic - no side effects )  -D/C Trazodone  -Start Seroquel 50 mgrs QHS for insomnia and  Night time anxiety- patient interested in this trial, we reviewed side effects -Encouraged ongoing milieu participation to work on coping skills and symptom reduction -Continue to encourage full abstinence / relapse prevention efforts as part of treatment goals.  Nehemiah Massed,  MD  07/18/2015, 09:28AM

## 2015-07-18 NOTE — Tx Team (Signed)
Interdisciplinary Treatment Plan Update (Adult) Date: 07/18/2015   Date: 07/18/2015 3:57 PM  Progress in Treatment:  Attending groups: Yes  Participating in groups: Yes  Taking medication as prescribed: Yes  Tolerating medication: Yes  Family/Significant othe contact made: No, Pt declines Patient understands diagnosis: Continuing to assess Discussing patient identified problems/goals with staff: Yes  Medical problems stabilized or resolved: Yes  Denies suicidal/homicidal ideation: No, Pt endorses SI with plan to hang himself Patient has not harmed self or Others: Yes   New problem(s) identified: None identified at this time.   Discharge Plan or Barriers: Pt will return home and follow-up with [facility]  Additional comments: n/a   Reason for Continuation of Hospitalization:  Depression Medication stabilization Suicidal ideation  Estimated length of stay: 1-3 days  Review of initial/current patient goals per problem list:   1.  Goal(s): Patient will participate in aftercare plan  Met:  Yes  Target date: 3-5 days from date of admission   As evidenced by: Patient will participate within aftercare plan AEB aftercare provider and housing plan at discharge being identified.   07/13/15: Pt will return home and follow-up with his ACTT services  2.  Goal (s): Patient will exhibit decreased depressive symptoms and suicidal ideations.  Met:  No  Target date: 3-5 days from date of admission   As evidenced by: Patient will utilize self rating of depression at 3 or below and demonstrate decreased signs of depression or be deemed stable for discharge by MD.  07/13/15: Pt rates depression at 5/10; denies SI  07/18/15: Pt rates depression at 8/10; endorses SI with plan to hang himself.  Attendees:  Patient:    Family:    Physician: Dr. Parke Poisson, MD  07/18/2015 3:57 PM  Nursing: Lars Pinks, RN Case manager  07/18/2015 3:57 PM  Clinical Social Worker Peri Maris, [place] 07/18/2015 3:57 PM  Other: Tilden Fossa, [place] 07/18/2015 3:57 PM  Clinical: Darrol Angel, RN 07/18/2015 3:57 PM  Other: , RN Charge Nurse 07/18/2015 3:57 PM  Other:     Peri Maris, [place] Social Work [phone]

## 2015-07-19 NOTE — Progress Notes (Signed)
D: Pt irritable on approach. Pt refused to take morning med. Pt has been in his room sleeping most of the morning. Pt endorses passive suicidal thoughts with at plan to hang himself. Pt verbally contracts not to harm self. Pt refused to fill out self inventory sheet. Pt denies any stressors. A: Medications reviewed with pt and offered. Verbal support given. Pt encouraged to participate in tx. 15 minute checks performed for safety. R: Pt uncooperative this morning. Safety maintained.
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
## 2015-07-19 NOTE — [facility] Group Notes (Signed)
Adult Psychoeducational Group Note  Date:  07/19/2015 Time:  8:41 PM  Group Topic/Focus:  Wrap-Up Group:   The focus of this group is to help patients review their daily goal of treatment and discuss progress on daily workbooks.  Participation Level:  Active  Participation Quality:  Appropriate  Affect:  Appropriate  Cognitive:  Appropriate  Insight: Lacking  Engagement in Group:  Engaged  Modes of Intervention:  Discussion  Additional Comments:  Patient stated his day was all right.  He stated he slept most of the day.  He is discharging tomorrow back home.  Caroll RancherLindsay, Jaymeson Mengel A 07/19/2015, 8:41 PM

## 2015-07-19 NOTE — Progress Notes (Signed)
Pt reports he continues to have suicidal thoughts off and on everyday.  He c/o depression and being tired all the time.  At other times when staff is not in the immediate area, but can see the pt, he is socially engaged, laughing and joking with peers.  He denies HI/AVH.  He is unsure of his discharge plans.  Pt makes his needs known to staff.  Support and encouragement offered.  Safety maintained with q15 minute checks.

## 2015-07-19 NOTE — Progress Notes (Signed)
Recreation Therapy Notes  Animal-Assisted Activity (AAA) Program Checklist/Progress Notes Patient Eligibility Criteria Checklist & Daily Group note for Rec Tx Intervention  Date: 11.29.2016 Time: 2:45pm Location: [address] Dayroom    AAA/T Program Assumption of Risk Form signed by Patient/ or Parent Legal Guardian yes  Patient is free of allergies or sever asthma yes  Patient reports no fear of animals yes  Patient reports no history of cruelty to animals yes  Patient understands his/her participation is voluntary yes  Behavioral Response: Did not attend.   Ryan Bartlett, LRT/CTRS  Ryan Bartlett 07/19/2015 3:16 PM 

## 2015-07-19 NOTE — Progress Notes (Signed)
D. Pt had been up and visible in milieu this evening, did attend and participate in evening group activity. Pt seen interacting appropriately with peers and spoke about his on-going depression. Pt received bedtime medication without incident and did not verbalize any complaints of pain. A. Support and encouragement provided. R. Safety maintained, will continue to monitor.

## 2015-07-19 NOTE — Progress Notes (Signed)
Patient ID: Ryan Bartlett, male   DOB: 30-Jun-1996, 19 y.o.   MRN: [id number] [facility] MD Progress Note  07/19/2015  Ryan Bartlett  MRN:  [id number] Subjective:    Patient reports that this morning felt " pretty tired " so stayed in bed until later . Denies medication side effects. States he has continued to feel depressed but today does acknowledge some improvement. Feels medications are well tolerated..   Objective:  Patient case discussed with treatment team and patient seen. Patient has continued to report depression and some ongoing suicidal ideations, but affect is noted to be bright and fully reactive, in particular when interacting with peers, in  Day room, is noted to smile, laugh and  Joke with peers . He is also future oriented, looking forward to seeing his siblings again, and states he is working on disability application. As noted, today does endorse some improvement of mood and seems more amenable to discuss discharge planning and disposition planning. He plans to return to live with his mother, whom he identifies as closest support .  Does not endorse medication side effects at this time. Has reported occasional involuntary " twitches", but states these are episodic and none are noted during session.     Principal Problem: Major depressive disorder, recurrent episode, moderate (HCC) Diagnosis:   Patient Active Problem List   Diagnosis Date Noted  . Major depressive disorder, recurrent episode, moderate (HCC) [F33.1] 07/09/2015  . Depression [F32.9]   . Suicidal ideation [R45.851]   . Cannabis use disorder, moderate, dependence (HCC) [F12.20] 06/06/2015  . Alcohol use disorder, moderate, dependence (HCC) [F10.20] 06/06/2015  . Bipolar I disorder, most recent episode depressed (HCC) [F31.30] 05/23/2013  . Cannabis abuse [F12.10] 03/30/2012  . ADHD (attention deficit hyperactivity disorder), combined type [F90.2] 08/28/2011  . Conduct disorder, adolescent onset type [F91.2]  08/28/2011   Total Time spent with patient:  20 minutes   Past Medical History:  Past Medical History  Diagnosis Date  . ADHD (attention deficit hyperactivity disorder)   . Unspecified episodic mood disorder   . Oppositional defiant disorder   . Bipolar disorder (HCC)   . Depression     Past Surgical History  Procedure Laterality Date  . No past surgeries     Family History:  Family History  Problem Relation Age of Onset  . Depression Mother   . Diabetes Other   . Hyperlipidemia Other   . Hypertension Other     Social History:  History  Alcohol Use No    Comment: "I actually recently quit"     History  Drug Use No    Social History   Social History  . Marital Status: Single    Spouse Name: N/A  . Number of Children: N/A  . Years of Education: N/A   Occupational History  . student     12th grade at [employer]   Social History Main Topics  . Smoking status: Current Every Day Smoker -- 0.50 packs/day for 0 years    Types: Cigarettes  . Smokeless tobacco: Never Used  . Alcohol Use: No     Comment: "I actually recently quit"  . Drug Use: No  . Sexual Activity: Not Asked     Comment: Pt reports that he is not sexually active   Other Topics Concern  . None   Social History Narrative   Additional Social History:    Pain Medications: None Prescriptions: See PTA medication list Over the Counter: None History of  alcohol / drug use?: No history of alcohol / drug abuse Longest period of sobriety (when/how long): over a month Negative Consequences of Use: Legal Name of Substance 1: K-2 1 - Age of First Use: 19 years of age 51 - Amount (size/oz): Joint per day 1 - Frequency: 5 to 6 times in a week 1 - Duration: <1 year 1 - Last Use / Amount: A week ago.  Sleep:   States not sleeping well .  Appetite: good   Current Medications: Current Facility-Administered Medications  Medication Dose Route Frequency Provider Last Rate Last Dose  . acetaminophen  (TYLENOL) tablet 650 mg  650 mg Oral Q6H PRN Worthy FlankIjeoma E Nwaeze, NP   650 mg at 07/16/15 1351  . albuterol (PROVENTIL HFA;VENTOLIN HFA) 108 (90 BASE) MCG/ACT inhaler 2 puff  2 puff Inhalation Q4H PRN Rachael FeeIrving A Lugo, MD   2 puff at 07/15/15 1352  . alum & mag hydroxide-simeth (MAALOX/MYLANTA) 200-200-20 MG/5ML suspension 30 mL  30 mL Oral Q4H PRN Worthy FlankIjeoma E Nwaeze, NP   30 mL at 07/12/15 0759  . cephALEXin (KEFLEX) capsule 500 mg  500 mg Oral 3 times per day Beau FannyJohn C Withrow, FNP   500 mg at 07/19/15 1442  . citalopram (CELEXA) tablet 30 mg  30 mg Oral Daily Thresa RossNadeem Akhtar, MD   30 mg at 07/18/15 0817  . diphenhydrAMINE (BENADRYL) capsule 25 mg  25 mg Oral PRN Truman Haywardakia S Starkes, FNP   25 mg at 07/16/15 2219   Or  . diphenhydrAMINE (BENADRYL) injection 25 mg  25 mg Intramuscular PRN Truman Haywardakia S Starkes, FNP      . divalproex (DEPAKOTE ER) 24 hr tablet 1,500 mg  1,500 mg Oral QHS Craige CottaFernando A Cobos, MD   1,500 mg at 07/18/15 2221  . ibuprofen (ADVIL,MOTRIN) tablet 600 mg  600 mg Oral Q6H PRN Kristeen MansFran E Hobson, NP   600 mg at 07/18/15 0959  . magnesium hydroxide (MILK OF MAGNESIA) suspension 30 mL  30 mL Oral Daily PRN Worthy FlankIjeoma E Nwaeze, NP      . nicotine polacrilex (NICORETTE) gum 2 mg  2 mg Oral PRN Craige CottaFernando A Cobos, MD   2 mg at 07/17/15 1928  . QUEtiapine (SEROQUEL) tablet 50 mg  50 mg Oral QHS Craige CottaFernando A Cobos, MD   50 mg at 07/18/15 2221   Lab Results:  No results found for this or any previous visit (from the past 48 hour(s)). Physical Findings:  AIMS: Facial and Oral Movements Muscles of Facial Expression: None, normal Lips and Perioral Area: None, normal Jaw: None, normal Tongue: None, normal,Extremity Movements Upper (arms, wrists, hands, fingers): None, normal Lower (legs, knees, ankles, toes): None, normal, Trunk Movements Neck, shoulders, hips: None, normal, Overall Severity Severity of abnormal movements (highest score from questions above): None, normal Incapacitation due to abnormal movements: None,  normal Patient's awareness of abnormal movements (rate only patient's report): No Awareness, Dental Status Current problems with teeth and/or dentures?: No Does patient usually wear dentures?: No  CIWA:  CIWA-Ar Total: 3 COWS:  COWS Total Score: 1  Musculoskeletal: Strength & Muscle Tone: within normal limits Gait & Station: normal Patient leans: N/A  Psychiatric Specialty Exam: Review of Systems  Constitutional: Negative for fever.  Neurological: Negative for tremors.  Psychiatric/Behavioral: Positive for depression. Negative for suicidal ideas. The patient is nervous/anxious and has insomnia.   All other systems reviewed and are negative.   Nausea now improved, no vomiting, no headache, no abdominal distress or pain today  Blood  pressure 153/92, pulse 81, temperature 97.5 F (36.4 C), temperature source Oral, resp. rate 17, height  (1.753 m), weight 231 lb (104.781 kg).Body mass index is 34.1 kg/(m^2).  General Appearance:  fairly groomed   Patent attorney::  Good  Speech:  Normal Rate  Volume:  Normal  Mood:   Reports  Depression but acknowledges improvement  Affect:  Today fully reactive, smiling , laughing appropriately  Thought Process:  Linear  Orientation:  Full (Time, Place, and Person)  Thought Content:  denies hallucinations, no delusions     Suicidal Thoughts:  Describes chronic suicidal ideations--today denies any plan or intention of hurting self or of SI, and contracts for safety on the unit   Homicidal Thoughts:  No denies   Memory:  recent and remote grossly intact   Judgement:   Fair   Insight:   Fair   Psychomotor Activity:  Normal  Concentration:  Good  Recall:  Good  Fund of Knowledge:Good  Language: Good  Akathisia:  Negative  Handed:  Left  AIMS (if indicated):     Assets:  Communication Skills Desire for Improvement Resilience  ADL's:  Intact  Cognition: WNL  Sleep:  Number of Hours: 6.25   Assessment -  Patient has reported ongoing  depression, sadness, but presents with full range of affect, and noted to have bright affect when interacting with selected peers. He has reported chronic SI, but today denies any active SI. He is becoming more focused on disposition planning, and seeming to be less apprehensive about potential discharge, stating he feels comfortable with returning to mother's home after discharge. No medication side effects at present - no abnormal involuntary muscular movements noted at present . Now on Seroquel for night time insomnia, anxiety, and for mood. Tolerating medications well at present .   Treatment Plan Summary: Daily contact with patient to assess and evaluate symptoms and progress in treatment, Medication management, Plan inpatient treatment and medications as below  -Continue Celexa   QDAY  for depression and anxiety - Continue Depakote ER to 1500 mgrs QHS mg daily for mood stabilization  -Continue  Seroquel 50 mgrs QHS for insomnia and  Night time anxiety -Encouraged ongoing milieu participation to work on coping skills and symptom reduction -Continue to encourage full abstinence / relapse prevention efforts as part of treatment goals. -Treatment team working on disposition planning, patient plans to return home and has an established ACT team as well   Nehemiah Massed,  MD  07/19/2015, 09:28AM

## 2015-07-19 NOTE — [facility] Group Notes (Signed)
[facility] [name] Group Therapy 07/19/2015 1:15 PM  Type of Therapy: Group Therapy- Feelings about Diagnosis  Participation Level: Active   Participation Quality:  Appropriate  Affect:  Appropriate  Cognitive: Alert and Oriented   Insight:  Developing   Engagement in Therapy: Developing/Improving and Engaged   Modes of Intervention: Clarification, Confrontation, Discussion, Education, Exploration, Limit-setting, Orientation, Problem-solving, Rapport Building, Dance movement psychotherapisteality Testing, Socialization and Support  Description of Group:   This group will allow patients to explore their thoughts and feelings about diagnoses they have received. Patients will be guided to explore their level of understanding and acceptance of these diagnoses. Facilitator will encourage patients to process their thoughts and feelings about the reactions of others to their diagnosis, and will guide patients in identifying ways to discuss their diagnosis with significant others in their lives. This group will be process-oriented, with patients participating in exploration of their own experiences as well as giving and receiving support and challenge from other group members.  Summary of Progress/Problems:  Pt participated appropriately in group. He focused on how some family members do not validate his mental health symptoms or treatment and how this is difficult for him to deal with. Pt describes his struggle with mental health issues as being alleviated somewhat by his mother as she is educated on the topic of mental illness.   Therapeutic Modalities:   Cognitive Behavioral Therapy Solution Focused Therapy Motivational Interviewing Relapse Prevention Therapy  Chad CordialLauren Carter, LCSWA 07/19/2015 3:20 PM

## 2015-07-20 MED ORDER — CITALOPRAM HYDROBROMIDE 10 MG PO TABS: 30.0000 mg | ORAL_TABLET | Freq: Every day | ORAL | Status: DC

## 2015-07-20 MED ORDER — CEPHALEXIN 500 MG PO CAPS: 500.0000 mg | ORAL_CAPSULE | Freq: Three times a day (TID) | ORAL | Status: DC

## 2015-07-20 MED ORDER — NICOTINE POLACRILEX 2 MG MT GUM: 2.0000 mg | CHEWING_GUM | OROMUCOSAL | Status: DC | PRN

## 2015-07-20 MED ORDER — QUETIAPINE FUMARATE 50 MG PO TABS: 50.0000 mg | ORAL_TABLET | Freq: Every day | ORAL | Status: DC

## 2015-07-20 MED ORDER — ALBUTEROL SULFATE HFA 108 (90 BASE) MCG/ACT IN AERS: 2.0000 | INHALATION_SPRAY | RESPIRATORY_TRACT | Status: DC | PRN

## 2015-07-20 MED ORDER — DIVALPROEX SODIUM ER 500 MG PO TB24: 1500.0000 mg | ORAL_TABLET | Freq: Every day | ORAL | Status: DC

## 2015-07-20 NOTE — Discharge Summary (Signed)
Physician Discharge Summary Note  Patient:  Ryan Bartlett is an 19 y.o., male  MRN:  [id number]  DOB:  1996/06/12  Patient phone:  [phone] (home)   Patient address:   [address]  [place] [address],  Total Time spent with patient: Greater than 30 minutes  Date of Admission:  07/09/2015  Date of Discharge: 07/20/2015  Reason for Admission: Worsening symptoms of depression/suicidal ideations  Principal Problem: Major depressive disorder, recurrent episode, moderate (HCC)  Discharge Diagnoses: Patient Active Problem List   Diagnosis Date Noted  . Major depressive disorder, recurrent episode, moderate (HCC) [F33.1] 07/09/2015  . Depression [F32.9]   . Suicidal ideation [R45.851]   . Cannabis use disorder, moderate, dependence (HCC) [F12.20] 06/06/2015  . Alcohol use disorder, moderate, dependence (HCC) [F10.20] 06/06/2015  . Bipolar I disorder, most recent episode depressed (HCC) [F31.30] 05/23/2013  . Cannabis abuse [F12.10] 03/30/2012  . ADHD (attention deficit hyperactivity disorder), combined type [F90.2] 08/28/2011  . Conduct disorder, adolescent onset type [F91.2] 08/28/2011   Musculoskeletal: Strength & Muscle Tone: within normal limits Gait & Station: normal Patient leans: N/A  Psychiatric Specialty Exam:  SEE MD SRA Physical Exam  Vitals reviewed. Constitutional: He is oriented to person, place, and time. He appears well-developed.  HENT:  Head: Normocephalic.  Eyes: Pupils are equal, round, and reactive to light.  Neck: Normal range of motion.  Cardiovascular: Normal rate.   Respiratory: Effort normal.  GI: Soft.  Genitourinary:  Denies any issues in this area   Musculoskeletal: Normal range of motion.  Neurological: He is alert and oriented to person, place, and time.  Skin: Skin is warm and dry.  Psychiatric: His mood appears not anxious. Thought content is not paranoid. He expresses no homicidal and no suicidal ideation.    Review of  Systems  Constitutional: Negative.   HENT: Negative.   Eyes: Negative.   Respiratory: Negative.   Cardiovascular: Negative.   Gastrointestinal: Negative.   Genitourinary: Negative.   Musculoskeletal: Negative.   Skin: Negative.   Neurological: Negative.   Endo/Heme/Allergies: Negative.   Psychiatric/Behavioral: Positive for depression (Stable) and substance abuse (Hx alcoholism). Negative for suicidal ideas, hallucinations and memory loss. The patient has insomnia (Stable). The patient is not nervous/anxious.   All other systems reviewed and are negative.   Blood pressure 119/76, pulse 69, temperature 97.6 F (36.4 C), temperature source Oral, resp. rate 18, height  (1.753 m), weight 104.781 kg (231 lb).Body mass index is 34.1 kg/(m^2).  Have you used any form of tobacco in the last 30 days? (Cigarettes, Smokeless Tobacco, Cigars, and/or Pipes): Yes  Has this patient used any form of tobacco in the last 30 days? (Cigarettes, Smokeless Tobacco, Cigars, and/or Pipes) Yes, A prescription for an FDA-approved tobacco cessation medication was offered at discharge and the patient refused  Past Medical History:  Past Medical History  Diagnosis Date  . ADHD (attention deficit hyperactivity disorder)   . Unspecified episodic mood disorder   . Oppositional defiant disorder   . Bipolar disorder (HCC)   . Depression     Past Surgical History  Procedure Laterality Date  . No past surgeries     Family History:  Family History  Problem Relation Age of Onset  . Depression Mother   . Diabetes Other   . Hyperlipidemia Other   . Hypertension Other    Social History:  History  Alcohol Use No    Comment: "I actually recently quit"     History  Drug Use No    Social History   Social History  . Marital Status: Single    Spouse Name: N/A  . Number of Children: N/A  . Years of Education: N/A   Occupational History  . student     12th grade at [employer]   Social History Main  Topics  . Smoking status: Current Every Day Smoker -- 0.50 packs/day for 0 years    Types: Cigarettes  . Smokeless tobacco: Never Used  . Alcohol Use: No     Comment: "I actually recently quit"  . Drug Use: No  . Sexual Activity: Not Asked     Comment: Pt reports that he is not sexually active   Other Topics Concern  . None   Social History Narrative   Risk to Self: Is patient at risk for suicide?: Yes Risk to Others: No  Prior Inpatient Therapy: Yes Prior Outpatient Therapy: Yes  Level of Care:  OP  Hospital Course: 19 year old male, known to our unit from prior psychiatric admission from 10/12- 06/09/15. At that time had presented with depression in the context of homelessness and substance abuse.He states " I was doing OK for a while", but states he started feeling very depressed again, but cannot identify and specific stressors/ issues that may have contributed. States he had continued to take his prescribed medications ( Depakote ER/ Geodon), but had stopped Geodon 2 weeks ago, Because he felt it was causing him to have migraines. Of note, states he has been completely sober/abstinent since his last admission, so does not feel substance abuse has been a factor in recent increased symptoms of depression. States that he started developing passive suicidal ideations at first, but recently had started thinking of " hanging myself , so I knew I had to come to hospital".   Upon his arrival & admision to the [facility] adult unit, Ryan Bartlett was evaluated & his presenting symptoms identified. The medication management for the presenting symptoms were discussed & initiated targeting those symptoms. He was enrolled in the group counseling sessions & encouraged to participate in the unit programming. His other pre-existing medical problems were identified & his home medications restarted accordingly. Ryan Bartlett was evaluated on daily basis by the clinical providers to assure his response to the treatment  regimen.As his treatment progressed,  improvement was noted as evidenced by his report of decreasing symptoms, improved sleep, mood, affect, medication tolerance & active participation in the unit programming. He was encouraged to update his providers on his progress by daily completion of a self inventory assessment, noting mood, mental status, pain, any new symptoms, anxiety and or concerns.  Ryan Bartlett's symptoms responded well to his treatment regimen combined with a therapeutic and supportive environment. He was motivated for recovery as evidenced by a positive/appropriate behavior and his interaction with the staff & fellow patients.He also worked closely with the treatment team and case manager to develop a discharge plan with appropriate goals to maintain mood stability after discharge. Coping skills, problem solving as well as relaxation therapies were also part of the unit programming.  Upon discharge, Ryan Bartlett was in much improved condition than upon admission.His symptoms were reported as significantly decreased or resolved completely. He adamantly denies any SI/HI,  AVH, delusional thoughts & or paranoia. He was motivated to continue taking medication with a goal of continued improvement in mental health. He will continue psychiatric care on an outpatient basis as noted below with his ACT team. He is provided with all the  necessary information required to make these appointments without problems.  He left [facility] with all personal belongings in no apparent distress. Transportation per family.  Consults:  psychiatry  Significant Diagnostic Studies:  labs: CBC with diff, CMP, UDS, toxicology tests, U/A, results reviewed, stable  Discharge Vitals:   Blood pressure 119/76, pulse 69, temperature 97.6 F (36.4 C), temperature source Oral, resp. rate 18, height 5\' 9"  (1.753 m), weight 104.781 kg (231 lb). Body mass index is 34.1 kg/(m^2). Lab Results:   No results found for this or any previous visit  (from the past 72 hour(s)).  Physical Findings: AIMS: Facial and Oral Movements Muscles of Facial Expression: None, normal Lips and Perioral Area: None, normal Jaw: None, normal Tongue: None, normal,Extremity Movements Upper (arms, wrists, hands, fingers): None, normal Lower (legs, knees, ankles, toes): None, normal, Trunk Movements Neck, shoulders, hips: None, normal, Overall Severity Severity of abnormal movements (highest score from questions above): None, normal Incapacitation due to abnormal movements: None, normal Patient's awareness of abnormal movements (rate only patient's report): No Awareness, Dental Status Current problems with teeth and/or dentures?: No Does patient usually wear dentures?: No  CIWA:  CIWA-Ar Total: 3 COWS:  COWS Total Score: 1  See Psychiatric Specialty Exam and Suicide Risk Assessment completed by Attending Physician prior to discharge.  Discharge destination:  Home  Is patient on multiple antipsychotic therapies at discharge:  No   Has Patient had three or more failed trials of antipsychotic monotherapy by history:  No   Recommended Plan for Multiple Antipsychotic Therapies: NA    Medication List    STOP taking these medications        cyclobenzaprine 5 MG tablet  Commonly known as:  FLEXERIL     traZODone 50 MG tablet  Commonly known as:  DESYREL      TAKE these medications      Indication   albuterol 108 (90 BASE) MCG/ACT inhaler  Commonly known as:  PROVENTIL HFA;VENTOLIN HFA  Inhale 2 puffs into the lungs every 4 (four) hours as needed for wheezing or shortness of breath.   Indication:  Asthma     cephALEXin 500 MG capsule  Commonly known as:  KEFLEX  Take 1 capsule (500 mg total) by mouth every 8 (eight) hours. For infection   Indication:  Infection of the Skin and Skin Structures     citalopram 10 MG tablet  Commonly known as:  CELEXA  Take 3 tablets (30 mg total) by mouth daily. For depression   Indication:  Depression      divalproex 500 MG 24 hr tablet  Commonly known as:  DEPAKOTE ER  Take 3 tablets (1,500 mg total) by mouth at bedtime. For mood stabilization   Indication:  Mood stabilization     nicotine polacrilex 2 MG gum  Commonly known as:  NICORETTE  Take 1 each (2 mg total) by mouth as needed for smoking cessation.   Indication:  Nicotine Addiction     QUEtiapine 50 MG tablet  Commonly known as:  SEROQUEL  Take 1 tablet (50 mg total) by mouth at bedtime. For mood control/insomnia   Indication:  Mood control/insomnia       Follow-up Information    Follow up with [facility] On 07/21/2015.   Why:  Your ACTT team will follow-up with you after discharge.   Contact information:   [facility] [id number] [place]  [address] Phone: [phone]      Follow-up recommendations:  Activity:  As tolerated Diet: As recommended by your primary care doctor. Keep all scheduled follow-up appointments as recommended.  Comments:  Take all your medications as prescribed by your mental healthcare provider. Report any adverse effects and or reactions from your medicines to your outpatient provider promptly. Patient is instructed and cautioned to not engage in alcohol and or illegal drug use while on prescription medicines. In the event of worsening symptoms, patient is instructed to call the crisis hotline, 911 and or go to the nearest ED for appropriate evaluation and treatment of symptoms. Follow-up with your primary care provider for your other medical issues, concerns and or health care needs.   Total Discharge Time: Greater than 30 minutes  Signed: Armandina StammerNwoko, Agnes I PMHNP, FNP-BC 07/20/2015, 10:41 AM  Patient seen, Suicide Assessment Completed.  Disposition Plan Reviewed

## 2015-07-20 NOTE — [facility] Suicide Risk Assessment (Signed)
[facility] Discharge Suicide Risk Assessment   Demographic Factors:  19 year old single male, living with mother   Total Time spent with patient: 30 minutes  Musculoskeletal: Strength & Muscle Tone: within normal limits Gait & Station: normal Patient leans: N/A  Psychiatric Specialty Exam: Physical Exam  ROS  Blood pressure 119/76, pulse 69, temperature 97.6 F (36.4 C), temperature source Oral, resp. rate 18, height  (1.753 m), weight 231 lb (104.781 kg).Body mass index is 34.1 kg/(m^2).  General Appearance: improved grooming   Eye Contact::  Good  Speech:  Normal Rate409  Volume:  Normal  Mood:  Improved , states " middle of the road" today  Affect:  Appropriate  Thought Process:  Linear  Orientation:  Other:  fully alert and attentive   Thought Content:  denies hallucinations, no delusions   Suicidal Thoughts:  No at this time denies plan , intention of hurting self or of SI. States that he has  Had chronic suicidal ideations ( passive ) , but at this time denies SI  Homicidal Thoughts:  No  Memory:  recent and remote grossly intact   Judgement:  Other:  improved   Insight:  improved  Psychomotor Activity:  Normal  Concentration:  Good  Recall:  Good  Fund of Knowledge:Good  Language: Good  Akathisia:  Negative  Handed:  Right  AIMS (if indicated):     Assets:  Communication Skills Desire for Improvement Resilience  Sleep:  Number of Hours: 6.25  Cognition: WNL  ADL's:  Intact   Have you used any form of tobacco in the last 30 days? (Cigarettes, Smokeless Tobacco, Cigars, and/or Pipes): Yes  Has this patient used any form of tobacco in the last 30 days? (Cigarettes, Smokeless Tobacco, Cigars, and/or Pipes) Yes, A prescription for an FDA-approved tobacco cessation medication was offered at discharge and the patient refused  Mental Status Per Nursing Assessment::   On Admission:  Suicidal ideation indicated by patient, Suicide plan, Self-harm thoughts  Current  Mental Status by Physician: At this time patient is improved compared to admission- he presents with improved mood, improved range of affect, and at this time denies any suicidal plan or any intention of hurting self . He has expressed having chronic suicidal ideations, for years, but describes these mostly as passive ideations and at this time denies any thoughts of suicide . No HI, no psychotic symptoms, no psychomotor agitation, future oriented, looking forward to being able to play with dogs again  Loss Factors: Denies any recent specific triggers,. History of unemployment, recently returned to live with mother, after being in a Group Home   Historical Factors: History of depression, history of suicide ideations, history of prior admissions, history of alcohol abuse   Risk Reduction Factors:   Sense of responsibility to family, Living with another person, especially a relative, Positive social support and Positive coping skills or problem solving skills  Continued Clinical Symptoms:  As noted, currently improved compared to admission   Cognitive Features That Contribute To Risk:  No gross cognitive deficits noted upon discharge. Is alert , attentive, and oriented x 3   Suicide Risk:  Mild:  Suicidal ideation of limited frequency, intensity, duration, and specificity.  There are no identifiable plans, no associated intent, mild dysphoria and related symptoms, good self-control (both objective and subjective assessment), few other risk factors, and identifiable protective factors, including available and accessible social support.  Principal Problem: Major depressive disorder, recurrent episode, moderate (HCC) Discharge Diagnoses:  Patient  Active Problem List   Diagnosis Date Noted  . Major depressive disorder, recurrent episode, moderate (HCC) [F33.1] 07/09/2015  . Depression [F32.9]   . Suicidal ideation [R45.851]   . Cannabis use disorder, moderate, dependence (HCC) [F12.20]  06/06/2015  . Alcohol use disorder, moderate, dependence (HCC) [F10.20] 06/06/2015  . Bipolar I disorder, most recent episode depressed (HCC) [F31.30] 05/23/2013  . Cannabis abuse [F12.10] 03/30/2012  . ADHD (attention deficit hyperactivity disorder), combined type [F90.2] 08/28/2011  . Conduct disorder, adolescent onset type [F91.2] 08/28/2011    Follow-up Information    Follow up with [facility] On 07/21/2015.   Why:  Your ACTT team will follow-up with you after discharge.   Contact information:   [facility] [address] [place]  [id number] Phone: [phone]       Plan Of Care/Follow-up recommendations:  Activity:  as tolerated  Diet:  Regular Tests:  NA Other:  see below  Is patient on multiple antipsychotic therapies at discharge:  No   Has Patient had three or more failed trials of antipsychotic monotherapy by history:  No  Recommended Plan for Multiple Antipsychotic Therapies: NA  Patient is discharging in good spirits Plans to return home and to continue working with his ACT team.   Jama FlavorsOBOS, Madaline GuthrieFERNANDO 07/20/2015, 12:01 PM

## 2015-07-20 NOTE — Progress Notes (Signed)
  [facility] :  Will you be returning to the same living situation after discharge:  Yes,  Pt returning to mother's home At discharge, do you have transportation home?: Yes,  Pt family to provide transportation Do you have the ability to pay for your medications: Yes,  Pt provided with prescriptions  Release of information consent forms completed and in the chart;  Patient's signature needed at discharge.  Patient to Follow up at: Follow-up Information    Follow up with [facility] On 07/21/2015.   Why:  Your ACTT team will follow-up with you after discharge.   Contact information:   [facility] [address] [place]  [id number] Phone: [phone]       Next level of care provider [facility] Link:no  Patient denies SI/HI: Yes,  Pt denies    Safety Planning and Suicide Prevention discussed: Yes,  with Pt; declines family contact  Have you used any form of tobacco in the last 30 days? (Cigarettes, Smokeless Tobacco, Cigars, and/or Pipes): Yes  Has patient been referred to the Quitline?: Patient refused referral  Elaina HoopsCarter, Cherry Wittwer M 07/20/2015, 9:33 AM

## 2015-07-20 NOTE — Progress Notes (Signed)
D:  Patient's self inventory sheet, patient had poor sleep, sleep medication was not helpful.  Good appetite, low energy level, poor concentration.  Rated depression and hopeless 5, anxiety 6.  Denied withdrawals.  SI, off/on, no plan, contracts for safety.  Denied physical problem.  Physical pain, R knee, given med.  Goal today is discharge.  Plans to discharge home. A:  Medications administered per MD orders.  Emotional support and encouragement given patient. R:  SI, contracts for safety, no plan.  Denied HI.  Denied A/V hallucinations.  Earlier this morning patient denied SI and HI, contracts for safety.  Denied A/V hallucinations. Patient stayed in bed this morning and did not come to med window for morning meds.

## 2015-07-20 NOTE — Progress Notes (Signed)
Discharge Note:  Patient discharged with friend and plans to return to his home.  Patient denied SI and HI.  Denied A/V hallucinations.  Denied pain.  Suicide prevention information given and discussed with patient who stated he understood and had no questions.  Patient stated he received all his belongings, clothing, toiletries, misc items, prescriptions, medications, shoe strings, phones, lighter, bandana, etc.  Patient stated he [facility].

## 2015-07-20 NOTE — Tx Team (Signed)
Interdisciplinary Treatment Plan Update (Adult) Date: 07/20/2015   Date: 07/20/2015 9:08 AM  Progress in Treatment:  Attending groups: Yes  Participating in groups: Yes  Taking medication as prescribed: Yes  Tolerating medication: Yes  Family/Significant othe contact made: No, Pt declines Patient understands diagnosis: Continuing to assess Discussing patient identified problems/goals with staff: Yes  Medical problems stabilized or resolved: Yes  Denies suicidal/homicidal ideation: Yes Patient has not harmed self or Others: Yes   New problem(s) identified: None identified at this time.   Discharge Plan or Barriers: Pt will return home and follow-up with [facility]  Additional comments: n/a   Reason for Continuation of Hospitalization:  Depression Medication stabilization Suicidal ideation  Estimated length of stay:  0 days; Pt stable for DC  Review of initial/current patient goals per problem list:   1.  Goal(s): Patient will participate in aftercare plan  Met:  Yes  Target date: 3-5 days from date of admission   As evidenced by: Patient will participate within aftercare plan AEB aftercare provider and housing plan at discharge being identified.   07/13/15: Pt will return home and follow-up with his ACTT services  2.  Goal (s): Patient will exhibit decreased depressive symptoms and suicidal ideations.  Met:  Adequate for DC  Target date: 3-5 days from date of admission   As evidenced by: Patient will utilize self rating of depression at 3 or below and demonstrate decreased signs of depression or be deemed stable for discharge by MD.  07/13/15: Pt rates depression at 5/10; denies SI  07/18/15: Pt rates depression at 8/10; endorses SI with plan to hang himself.  07/20/15: MD feels that Pt's symptoms have decreased to the point that they can be managed in an outpatient setting.  Attendees:  Patient:    Family:    Physician: Dr. Parke Poisson, MD   07/20/2015 9:08 AM  Nursing: Lars Pinks, RN Case manager  07/20/2015 9:08 AM  Clinical Social Worker Peri Maris, [place] 07/20/2015 9:08 AM  Other: Tilden Fossa, LCSWA 07/20/2015 9:08 AM  Clinical: Grayland Ormond, RN 07/20/2015 9:08 AM  Other: , RN Charge Nurse 07/20/2015 9:08 AM  Other:     Peri Maris, [place] Social Work [phone]

## 2015-07-20 NOTE — [facility] Group Notes (Signed)
[name]  07/20/2015 8:45 AM  Pt did not attend, declined invitation.   Chad CordialLauren Carter, LCSWA 07/20/2015 9:08 AM

## 2015-09-12 ENCOUNTER — Emergency Department (HOSPITAL_COMMUNITY)
Admission: EM | Admit: 2015-09-12 | Discharge: 2015-09-12 | Disposition: A | Discharge status: Home / Self Care | Payer: Medicaid Other | Attending: Emergency Medicine | Admitting: Emergency Medicine

## 2015-09-12 ENCOUNTER — Encounter (HOSPITAL_COMMUNITY): Payer: Self-pay | Admitting: Emergency Medicine

## 2015-09-12 ENCOUNTER — Emergency Department (HOSPITAL_COMMUNITY): Payer: Medicaid Other

## 2015-09-12 DIAGNOSIS — Z79899 Other long term (current) drug therapy: Secondary | ICD-10-CM | POA: Insufficient documentation

## 2015-09-12 DIAGNOSIS — F17210 Nicotine dependence, cigarettes, uncomplicated: Secondary | ICD-10-CM | POA: Insufficient documentation

## 2015-09-12 DIAGNOSIS — R1013 Epigastric pain: Secondary | ICD-10-CM | POA: Insufficient documentation

## 2015-09-12 DIAGNOSIS — F319 Bipolar disorder, unspecified: Secondary | ICD-10-CM | POA: Diagnosis not present

## 2015-09-12 DIAGNOSIS — R079 Chest pain, unspecified: Secondary | ICD-10-CM

## 2015-09-12 LAB — CBC
HEMATOCRIT: 41.7 % (ref 39.0–52.0)
Hemoglobin: 13.9 g/dL (ref 13.0–17.0)
MCH: 27.5 pg (ref 26.0–34.0)
MCHC: 33.3 g/dL (ref 30.0–36.0)
MCV: 82.6 fL (ref 78.0–100.0)
Platelets: 191 10*3/uL (ref 150–400)
RBC: 5.05 MIL/uL (ref 4.22–5.81)
RDW: 13.4 % (ref 11.5–15.5)
WBC: 7.7 10*3/uL (ref 4.0–10.5)

## 2015-09-12 LAB — BASIC METABOLIC PANEL
ANION GAP: 8 (ref 5–15)
BUN: 12 mg/dL (ref 6–20)
CHLORIDE: 106 mmol/L (ref 101–111)
CO2: 24 mmol/L (ref 22–32)
Calcium: 9.1 mg/dL (ref 8.9–10.3)
Creatinine, Ser: 0.8 mg/dL (ref 0.61–1.24)
GFR calc non Af Amer: >60 mL/min (ref >60)
Glucose, Bld: 113 mg/dL — ABNORMAL HIGH (ref 65–99)
POTASSIUM: 3.7 mmol/L (ref 3.5–5.1)
SODIUM: 138 mmol/L (ref 135–145)

## 2015-09-12 LAB — I-STAT TROPONIN, ED: Troponin i, poc: 0 ng/mL (ref 0.00–0.08)

## 2015-09-12 MED ORDER — OMEPRAZOLE 20 MG PO CPDR: 20.0000 mg | DELAYED_RELEASE_CAPSULE | Freq: Every day | ORAL | Status: DC

## 2015-09-12 NOTE — ED Notes (Signed)
Pt c/o sudden onset CP onset today at 0900 while ambulating. Pt received 324 mg aspirin, 0.4 mg Nitroglycin.

## 2015-09-12 NOTE — ED Provider Notes (Signed)
CSN: [id number]     Arrival date & time 09/12/15  0935 History   First MD Initiated Contact with Patient 09/12/15 1021     Chief Complaint  Patient presents with  . Chest Pain      Patient is a 20 y.o. male presenting with chest pain.  Chest Pain Associated symptoms: no abdominal pain, no back pain, no headache, no nausea, no numbness, no shortness of breath, not vomiting and no weakness    patient presents with chest pain. States he was out walking when he doubled over in pain. States this is a chest and had pain and burning. States the pain did improve but the burning was still there some. Slight nausea. No lightheadedness or dizziness. States he did feel his heart going faster. No fevers or chills no cough. States he does have history of reflux. No swelling in his legs. He does have a family history of cardiac disease but it sounds like it is not early. Patient does not have a history of known cardiac disease. Patient states he does have some knee pain at times and sometimes has to take NSAIDs for but has not been taking them recently.  Past Medical History  Diagnosis Date  . ADHD (attention deficit hyperactivity disorder)   . Unspecified episodic mood disorder   . Oppositional defiant disorder   . Bipolar disorder (HCC)   . Depression    Past Surgical History  Procedure Laterality Date  . No past surgeries     Family History  Problem Relation Age of Onset  . Depression Mother   . Diabetes Other   . Hyperlipidemia Other   . Hypertension Other    Social History  Substance Use Topics  . Smoking status: Current Every Day Smoker -- 0.50 packs/day for 0 years    Types: Cigarettes  . Smokeless tobacco: Never Used  . Alcohol Use: No     Comment: "I actually recently quit"    Review of Systems  Constitutional: Negative for activity change and appetite change.  Eyes: Negative for pain.  Respiratory: Negative for chest tightness and shortness of breath.   Cardiovascular:  Positive for chest pain. Negative for leg swelling.  Gastrointestinal: Negative for nausea, vomiting, abdominal pain and diarrhea.  Genitourinary: Negative for flank pain.  Musculoskeletal: Negative for back pain and neck stiffness.  Skin: Negative for rash.  Neurological: Negative for weakness, numbness and headaches.  Psychiatric/Behavioral: Negative for behavioral problems.      Allergies  Peanut-containing drug products and Lactose intolerance (gi)  Home Medications   Prior to Admission medications   Medication Sig Start Date End Date Taking? Authorizing Provider  albuterol (PROVENTIL HFA;VENTOLIN HFA) 108 (90 BASE) MCG/ACT inhaler Inhale 2 puffs into the lungs every 4 (four) hours as needed for wheezing or shortness of breath. 07/20/15  Yes Sanjuana Kava, NP  citalopram (CELEXA) 10 MG tablet Take 3 tablets (30 mg total) by mouth daily. For depression 07/20/15  Yes Sanjuana Kava, NP  divalproex (DEPAKOTE ER) 500 MG 24 hr tablet Take 3 tablets (1,500 mg total) by mouth at bedtime. For mood stabilization 07/20/15  Yes Sanjuana Kava, NP  QUEtiapine (SEROQUEL) 100 MG tablet Take 100 mg by mouth at bedtime.   Yes Historical Provider, MD  cephALEXin (KEFLEX) 500 MG capsule Take 1 capsule (500 mg total) by mouth every 8 (eight) hours. For infection 07/20/15   Sanjuana Kava, NP  nicotine polacrilex (NICORETTE) 2 MG gum Take 1 each (2 mg  total) by mouth as needed for smoking cessation. Patient not taking: Reported on 09/12/2015 07/20/15   Sanjuana Kava, NP  omeprazole (PRILOSEC) 20 MG capsule Take 1 capsule (20 mg total) by mouth daily. 09/12/15   Benjiman Core, MD  QUEtiapine (SEROQUEL) 50 MG tablet Take 1 tablet (50 mg total) by mouth at bedtime. For mood control/insomnia 07/20/15   Sanjuana Kava, NP   BP 129/79 mmHg  Pulse 60  Temp(Src) 98.4 F (36.9 C) (Oral)  Resp 16  SpO2 100% Physical Exam  Constitutional: He appears well-developed.  HENT:  Head: Atraumatic.  Cardiovascular:  Normal rate.   Pulmonary/Chest: Effort normal.  Abdominal: There is tenderness.  Slight epigastric tenderness without rebound or guarding. No hernias or masses palpated  Musculoskeletal: Normal range of motion.  Neurological: He is alert.  Skin: Skin is warm.    ED Course  Procedures (including critical care time) Labs Review Labs Reviewed  BASIC METABOLIC PANEL - Abnormal; Notable for the following:    Glucose, Bld 113 (*)    All other components within normal limits  CBC  I-STAT TROPOININ, ED    Imaging Review Dg Chest 2 View  09/12/2015  CLINICAL DATA:  New mid left chest pain since 0830 this morning. Cough. Smoker. EXAM: CHEST  2 VIEW COMPARISON:  None. FINDINGS: The heart size and mediastinal contours are within normal limits. Both lungs are clear. The visualized skeletal structures are unremarkable. IMPRESSION: No active cardiopulmonary disease. Electronically Signed   By: Charlett Nose M.D.   On: 09/12/2015 10:10   I have personally reviewed and evaluated these images and lab results as part of my medical decision-making.   EKG Interpretation   Date/Time:  Monday September 12 2015 09:47:46 EST Ventricular Rate:  72 PR Interval:  153 QRS Duration: 91 QT Interval:  395 QTC Calculation: 432 R Axis:   18 Text Interpretation:  Sinus rhythm RSR' in V1 or V2, right VCD or RVH  Confirmed by Arienna Benegas  MD, Zoi Devine [phone]) on 09/12/2015 10:23:04 AM      MDM   Final diagnoses:  Chest pain, unspecified chest pain type    Patient with chest pain. Doubt cardiac cause. EKG reassuring. Has had history of GERD and that could be the cause. Will discharge home with Prilosec prescription. Will follow-up as needed.    Benjiman Core, MD 09/12/15 1204

## 2015-09-12 NOTE — Discharge Instructions (Signed)
Nonspecific Chest Pain  °Chest pain can be caused by many different conditions. There is always a chance that your pain could be related to something serious, such as a heart attack or a blood clot in your lungs. Chest pain can also be caused by conditions that are not life-threatening. If you have chest pain, it is very important to follow up with your health care provider. °CAUSES  °Chest pain can be caused by: °· Heartburn. °· Pneumonia or bronchitis. °· Anxiety or stress. °· Inflammation around your heart (pericarditis) or lung (pleuritis or pleurisy). °· A blood clot in your lung. °· A collapsed lung (pneumothorax). It can develop suddenly on its own (spontaneous pneumothorax) or from trauma to the chest. °· Shingles infection (varicella-zoster virus). °· Heart attack. °· Damage to the bones, muscles, and cartilage that make up your chest wall. This can include: °¨ Bruised bones due to injury. °¨ Strained muscles or cartilage due to frequent or repeated coughing or overwork. °¨ Fracture to one or more ribs. °¨ Sore cartilage due to inflammation (costochondritis). °RISK FACTORS  °Risk factors for chest pain may include: °· Activities that increase your risk for trauma or injury to your chest. °· Respiratory infections or conditions that cause frequent coughing. °· Medical conditions or overeating that can cause heartburn. °· Heart disease or family history of heart disease. °· Conditions or health behaviors that increase your risk of developing a blood clot. °· Having had chicken pox (varicella zoster). °SIGNS AND SYMPTOMS °Chest pain can feel like: °· Burning or tingling on the surface of your chest or deep in your chest. °· Crushing, pressure, aching, or squeezing pain. °· Dull or sharp pain that is worse when you move, cough, or take a deep breath. °· Pain that is also felt in your back, neck, shoulder, or arm, or pain that spreads to any of these areas. °Your chest pain may come and go, or it may stay  constant. °DIAGNOSIS °Lab tests or other studies may be needed to find the cause of your pain. Your health care provider may have you take a test called an ambulatory ECG (electrocardiogram). An ECG records your heartbeat patterns at the time the test is performed. You may also have other tests, such as: °· Transthoracic echocardiogram (TTE). During echocardiography, sound waves are used to create a picture of all of the heart structures and to look at how blood flows through your heart. °· Transesophageal echocardiogram (TEE). This is a more advanced imaging test that obtains images from inside your body. It allows your health care provider to see your heart in finer detail. °· Cardiac monitoring. This allows your health care provider to monitor your heart rate and rhythm in real time. °· Holter monitor. This is a portable device that records your heartbeat and can help to diagnose abnormal heartbeats. It allows your health care provider to track your heart activity for several days, if needed. °· Stress tests. These can be done through exercise or by taking medicine that makes your heart beat more quickly. °· Blood tests. °· Imaging tests. °TREATMENT  °Your treatment depends on what is causing your chest pain. Treatment may include: °· Medicines. These may include: °¨ Acid blockers for heartburn. °¨ Anti-inflammatory medicine. °¨ Pain medicine for inflammatory conditions. °¨ Antibiotic medicine, if an infection is present. °¨ Medicines to dissolve blood clots. °¨ Medicines to treat coronary artery disease. °· Supportive care for conditions that do not require medicines. This may include: °¨ Resting. °¨ Applying heat   or cold packs to injured areas. °¨ Limiting activities until pain decreases. °HOME CARE INSTRUCTIONS °· If you were prescribed an antibiotic medicine, finish it all even if you start to feel better. °· Avoid any activities that bring on chest pain. °· Do not use any tobacco products, including  cigarettes, chewing tobacco, or electronic cigarettes. If you need help quitting, ask your health care provider. °· Do not drink alcohol. °· Take medicines only as directed by your health care provider. °· Keep all follow-up visits as directed by your health care provider. This is important. This includes any further testing if your chest pain does not go away. °· If heartburn is the cause for your chest pain, you may be told to keep your head raised (elevated) while sleeping. This reduces the chance that acid will go from your stomach into your esophagus. °· Make lifestyle changes as directed by your health care provider. These may include: °¨ Getting regular exercise. Ask your health care provider to suggest some activities that are safe for you. °¨ Eating a heart-healthy diet. A registered dietitian can help you to learn healthy eating options. °¨ Maintaining a healthy weight. °¨ Managing diabetes, if necessary. °¨ Reducing stress. °SEEK MEDICAL CARE IF: °· Your chest pain does not go away after treatment. °· You have a rash with blisters on your chest. °· You have a fever. °SEEK IMMEDIATE MEDICAL CARE IF:  °· Your chest pain is worse. °· You have an increasing cough, or you cough up blood. °· You have severe abdominal pain. °· You have severe weakness. °· You faint. °· You have chills. °· You have sudden, unexplained chest discomfort. °· You have sudden, unexplained discomfort in your arms, back, neck, or jaw. °· You have shortness of breath at any time. °· You suddenly start to sweat, or your skin gets clammy. °· You feel nauseous or you vomit. °· You suddenly feel light-headed or dizzy. °· Your heart begins to beat quickly, or it feels like it is skipping beats. °These symptoms may represent a serious problem that is an emergency. Do not wait to see if the symptoms will go away. Get medical help right away. Call your local emergency services (911 in the [place] Do not drive yourself to the hospital. °  °This  information is not intended to replace advice given to you by your health care provider. Make sure you discuss any questions you have with your health care provider. °  °Document Released: 05/16/2005 Document Revised: 08/27/2014 Document Reviewed: 03/12/2014 °Elsevier Interactive Patient Education ©2016 [employer] ° °

## 2015-09-16 ENCOUNTER — Emergency Department (HOSPITAL_COMMUNITY)
Admission: EM | Admit: 2015-09-16 | Discharge: 2015-09-17 | Disposition: A | Discharge status: Other Acute Inpatient Hospital | Payer: Medicaid Other | Attending: Emergency Medicine | Admitting: Emergency Medicine

## 2015-09-16 ENCOUNTER — Encounter (HOSPITAL_COMMUNITY): Payer: Self-pay

## 2015-09-16 DIAGNOSIS — F17210 Nicotine dependence, cigarettes, uncomplicated: Secondary | ICD-10-CM | POA: Insufficient documentation

## 2015-09-16 DIAGNOSIS — Y9289 Other specified places as the place of occurrence of the external cause: Secondary | ICD-10-CM | POA: Insufficient documentation

## 2015-09-16 DIAGNOSIS — F3130 Bipolar disorder, current episode depressed, mild or moderate severity, unspecified: Secondary | ICD-10-CM | POA: Diagnosis not present

## 2015-09-16 DIAGNOSIS — Z79899 Other long term (current) drug therapy: Secondary | ICD-10-CM | POA: Insufficient documentation

## 2015-09-16 DIAGNOSIS — T43592A Poisoning by other antipsychotics and neuroleptics, intentional self-harm, initial encounter: Secondary | ICD-10-CM | POA: Diagnosis not present

## 2015-09-16 DIAGNOSIS — Y9389 Activity, other specified: Secondary | ICD-10-CM | POA: Diagnosis not present

## 2015-09-16 DIAGNOSIS — Y998 Other external cause status: Secondary | ICD-10-CM | POA: Insufficient documentation

## 2015-09-16 DIAGNOSIS — T50902A Poisoning by unspecified drugs, medicaments and biological substances, intentional self-harm, initial encounter: Secondary | ICD-10-CM

## 2015-09-16 LAB — COMPREHENSIVE METABOLIC PANEL
ALT: 15 U/L — ABNORMAL LOW (ref 17–63)
AST: 18 U/L (ref 15–41)
Albumin: 4.2 g/dL (ref 3.5–5.0)
Alkaline Phosphatase: 62 U/L (ref 38–126)
Anion gap: 6 (ref 5–15)
BUN: 7 mg/dL (ref 6–20)
CHLORIDE: 110 mmol/L (ref 101–111)
CO2: 26 mmol/L (ref 22–32)
Calcium: 9.5 mg/dL (ref 8.9–10.3)
Creatinine, Ser: 0.83 mg/dL (ref 0.61–1.24)
Glucose, Bld: 108 mg/dL — ABNORMAL HIGH (ref 65–99)
POTASSIUM: 4.3 mmol/L (ref 3.5–5.1)
SODIUM: 142 mmol/L (ref 135–145)
Total Bilirubin: 0.4 mg/dL (ref 0.3–1.2)
Total Protein: 7.7 g/dL (ref 6.5–8.1)

## 2015-09-16 LAB — RAPID URINE DRUG SCREEN, HOSP PERFORMED
AMPHETAMINES: NOT DETECTED
BENZODIAZEPINES: NOT DETECTED
Barbiturates: NOT DETECTED
COCAINE: NOT DETECTED
OPIATES: NOT DETECTED
Tetrahydrocannabinol: NOT DETECTED

## 2015-09-16 LAB — SALICYLATE LEVEL

## 2015-09-16 LAB — CBC
HCT: 42.9 % (ref 39.0–52.0)
Hemoglobin: 14.3 g/dL (ref 13.0–17.0)
MCH: 27.9 pg (ref 26.0–34.0)
MCHC: 33.3 g/dL (ref 30.0–36.0)
MCV: 83.6 fL (ref 78.0–100.0)
PLATELETS: 203 10*3/uL (ref 150–400)
RBC: 5.13 MIL/uL (ref 4.22–5.81)
RDW: 13.4 % (ref 11.5–15.5)
WBC: 6.4 10*3/uL (ref 4.0–10.5)

## 2015-09-16 LAB — ACETAMINOPHEN LEVEL
Acetaminophen (Tylenol), Serum: <10 ug/mL — ABNORMAL LOW (ref 10–30)
Acetaminophen (Tylenol), Serum: <10 ug/mL — ABNORMAL LOW (ref 10–30)

## 2015-09-16 LAB — ETHANOL

## 2015-09-16 LAB — MAGNESIUM: Magnesium: 1.9 mg/dL (ref 1.7–2.4)

## 2015-09-16 LAB — CBG MONITORING, ED: Glucose-Capillary: 99 mg/dL (ref 65–99)

## 2015-09-16 LAB — VALPROIC ACID LEVEL: Valproic Acid Lvl: <10 ug/mL — ABNORMAL LOW (ref 50.0–100.0)

## 2015-09-16 MED ORDER — CITALOPRAM HYDROBROMIDE 20 MG PO TABS
30.0000 mg | ORAL_TABLET | Freq: Every day | ORAL | Status: DC
  Administered 2015-09-16 – 2015-09-17 (×2): 30 mg via ORAL
  Filled 2015-09-16 (×2): qty 1

## 2015-09-16 MED ORDER — ZOLPIDEM TARTRATE 5 MG PO TABS: 5.0000 mg | ORAL_TABLET | Freq: Every evening | ORAL | Status: DC | PRN

## 2015-09-16 MED ORDER — ONDANSETRON HCL 4 MG PO TABS: 4.0000 mg | ORAL_TABLET | Freq: Three times a day (TID) | ORAL | Status: DC | PRN

## 2015-09-16 MED ORDER — SODIUM CHLORIDE 0.9 % IV BOLUS (SEPSIS)
1000.0000 mL | Freq: Once | INTRAVENOUS | Status: AC
Start: 2015-09-16 — End: 2015-09-16
  Administered 2015-09-16: 1000 mL via INTRAVENOUS

## 2015-09-16 MED ORDER — PANTOPRAZOLE SODIUM 40 MG PO TBEC
40.0000 mg | DELAYED_RELEASE_TABLET | Freq: Every day | ORAL | Status: DC
  Administered 2015-09-16 – 2015-09-17 (×2): 40 mg via ORAL
  Filled 2015-09-16 (×2): qty 1

## 2015-09-16 MED ORDER — IBUPROFEN 200 MG PO TABS: 600.0000 mg | ORAL_TABLET | Freq: Three times a day (TID) | ORAL | Status: DC | PRN

## 2015-09-16 MED ORDER — ALUM & MAG HYDROXIDE-SIMETH 200-200-20 MG/5ML PO SUSP: 30.0000 mL | ORAL | Status: DC | PRN

## 2015-09-16 MED ORDER — ALBUTEROL SULFATE HFA 108 (90 BASE) MCG/ACT IN AERS: 2.0000 | INHALATION_SPRAY | RESPIRATORY_TRACT | Status: DC | PRN

## 2015-09-16 MED ORDER — ACETAMINOPHEN 325 MG PO TABS: 650.0000 mg | ORAL_TABLET | ORAL | Status: DC | PRN

## 2015-09-16 MED ORDER — DIVALPROEX SODIUM ER 500 MG PO TB24
1500.0000 mg | ORAL_TABLET | Freq: Every day | ORAL | Status: DC
  Administered 2015-09-16: 1500 mg via ORAL
  Filled 2015-09-16: qty 3

## 2015-09-16 NOTE — ED Notes (Signed)
Pt. Noted in room. No complaints or concerns voiced. No distress or abnormal behavior noted. Will continue to monitor with security cameras. Q 15 minute rounds continue. 

## 2015-09-16 NOTE — ED Notes (Signed)
Pt. Noted sleeping in room. No complaints or concerns voiced. No distress or abnormal behavior noted. Will continue to monitor with security cameras. Q 15 minute rounds continue. 

## 2015-09-16 NOTE — ED Notes (Signed)
Report received from Edie Marvin RN. Pt. Sleeping, respirations regular and unlabored. Will continue to monitor for safety via security cameras and Q 15 minute checks. 

## 2015-09-16 NOTE — ED Notes (Signed)
Per EMS, pt stopped at grocery store and had someone call for help. Pt states he took 8 of his seroquel extended release.  Pt states suicidal continues.  Fighting with mom and soon to be homeless. Pt is alert and oriented.  No vomiting in route.  Slight lethargy noted. Time frame 45 min.  IV in route 20g LAC.  EKG WNL.  Vitals: 180 90, hr 76, 98%, resp 16

## 2015-09-16 NOTE — BH Assessment (Addendum)
Assessment Note  Ryan Bartlett is an 20 y.o. male  Presenting to [facility] voluntarily after an intentional overdose of 8  Seroquel tablets. Patient states that he does not like "how I'm treated at home" and today after an argument with his sister she called him a name and he got upset and took his medications. Patient states that he then went to [facility] and asked to speak to an off-duty-officer, who called 911 for EMS to transport him to the ED>  Patient states that he has attempted suicide " a lot" but states that he does not feel that he is depressed. Patient endorses symptoms of insomnia and isolation but denies all other symptoms of depression. Patient states that in November he "picked out a tree" to hang himself and later changed his mind. Patient denies self injurious behaviors. Patient was last admitted to [facility] in November 2016. Patient states that he has an ACT Team with [facility] that he has had since August 2016 and states that he meets with them regularly. Patient denies HI and history of being violent towards others. Patient denies acces to firearms or weapons. Patient states that he is on probation for stealing copper. Patient states that he has an upcoming court date next month for violating his probation by not completing his GED, which was a condition of his probation, and moving without informing  his probation officer. Patient denies AVH and does not appear to be responding to internal stimuli. Patient reports that he uses an unknown amount of THC once a month and last used "half a blunt" last night. Patient denies other illicit drug use.   Patient UDS is clear and ETOH <5 at time of assessment.   Patient is alert and oriented to person, place, and situation but states that today is "Thursday, October 13, 2014." Patient appeared to be drowsy but answered questions after being asked once. Patient did require his name to be called loudly for him to wake up between questions.  Patient states that he sleeps five hours per night and his appetite is Good. Patient states that he did not follow up with his ACT Team today after the altercation stating that he just went to the grocery store to speak with someone.    Consulted with Nanine Means, DNP who recommends inpatient treatment at this time.   Diagnosis: Bipolar Disorder  Past Medical History:  Past Medical History  Diagnosis Date  . ADHD (attention deficit hyperactivity disorder)   . Unspecified episodic mood disorder   . Oppositional defiant disorder   . Bipolar disorder (HCC)   . Depression     Past Surgical History  Procedure Laterality Date  . No past surgeries      Family History:  Family History  Problem Relation Age of Onset  . Depression Mother   . Diabetes Other   . Hyperlipidemia Other   . Hypertension Other     Social History:  reports that he has been smoking Cigarettes.  He has been smoking about 0.50 packs per day for the past 0 years. He has never used smokeless tobacco. He reports that he does not drink alcohol or use illicit drugs.  Additional Social History:  Alcohol / Drug Use Pain Medications: See PTA Prescriptions: See PTA Over the Counter: See PTA History of alcohol / drug use?: Yes Substance #1 Name of Substance 1: THC 1 - Age of First Use: 12 1 - Amount (size/oz): UKN 1 - Frequency: once a month 1 -  Duration: ongoing 1 - Last Use / Amount: last night, .5 blunt  CIWA: CIWA-Ar BP: 133/71 mmHg Pulse Rate: 62 COWS:    Allergies:  Allergies  Allergen Reactions  . Peanut-Containing Drug Products Anaphylaxis  . Lactose Intolerance (Gi) Diarrhea and Nausea And Vomiting    Home Medications:  (Not in a hospital admission)  OB/GYN Status:  No LMP for male patient.  General Assessment Data Location of Assessment: [facility] TTS Assessment: In system Is this a Tele or Face-to-Face Assessment?: Face-to-Face Is this an Initial Assessment or a Re-assessment for this  encounter?: Initial Assessment Marital status: Single Is patient pregnant?: No Pregnancy Status: No Living Arrangements: Parent Can pt return to current living arrangement?: Yes Admission Status: Voluntary Is patient capable of signing voluntary admission?: Yes Referral Source: Other     Crisis Care Plan Living Arrangements: Parent Name of Psychiatrist: Dr. Otelia Santee (last appt 2 weeks ago) Name of Therapist: Tresa Endo ([facility])  Education Status Is patient currently in school?: No Highest grade of school patient has completed: 11th  Risk to self with the past 6 months Suicidal Ideation: Yes-Currently Present Has patient been a risk to self within the past 6 months prior to admission? : Yes Suicidal Intent: Yes-Currently Present Has patient had any suicidal intent within the past 6 months prior to admission? : Yes Is patient at risk for suicide?: Yes Suicidal Plan?: Yes-Currently Present Has patient had any suicidal plan within the past 6 months prior to admission? : Yes Specify Current Suicidal Plan: overdose Access to Means: Yes Specify Access to Suicidal Means: pills What has been your use of drugs/alcohol within the last 12 months?: THC daily Previous Attempts/Gestures: Yes How many times?:  ("a lot") Other Self Harm Risks: Denies Triggers for Past Attempts: Other (Comment) ("the way I'm treated at home") Intentional Self Injurious Behavior: None Family Suicide History: No Recent stressful life event(s): Other (Comment) ("the way I'm treated at home") Persecutory voices/beliefs?: No Depression: No (patient denies) Depression Symptoms: Insomnia, Isolating Substance abuse history and/or treatment for substance abuse?: Yes Suicide prevention information given to non-admitted patients: Not applicable  Risk to Others within the past 6 months Homicidal Ideation: No Does patient have any lifetime risk of violence toward others beyond the six months prior to admission? :  No Thoughts of Harm to Others: No Current Homicidal Intent: No Current Homicidal Plan: No Access to Homicidal Means: No Identified Victim: Denies History of harm to others?: No Assessment of Violence: None Noted Violent Behavior Description: Denies Does patient have access to weapons?: No Criminal Charges Pending?: Yes Describe Pending Criminal Charges: violation of probation Does patient have a court date: Yes Court Date: 10/14/15 Is patient on probation?: Yes  Psychosis Hallucinations: None noted Delusions: None noted  Mental Status Report Appearance/Hygiene: Body odor, In scrubs Eye Contact: Unable to Assess Motor Activity: Unable to assess Speech: Slurred Level of Consciousness: Drowsy Mood: Sad Affect: Sad Anxiety Level: None Thought Processes: Coherent, Relevant Judgement: Partial Orientation: Person, Place, Situation Obsessive Compulsive Thoughts/Behaviors: None  Cognitive Functioning Concentration: Decreased Memory: Recent Intact, Remote Intact IQ: Average Insight: Fair Impulse Control: Poor Appetite: Good Sleep: Decreased Total Hours of Sleep: 5 Vegetative Symptoms: None  ADLScreening [facility] Assessment Services) Patient's cognitive ability adequate to safely complete daily activities?: Yes Patient able to express need for assistance with ADLs?: Yes Independently performs ADLs?: Yes (appropriate for developmental age)  Prior Inpatient Therapy Prior Inpatient Therapy: Yes Prior Therapy Dates: 06/2015 Prior Therapy Facilty/Provider(s): [facility] Reason for Treatment: SI  Prior Outpatient Therapy Prior Outpatient Therapy: Yes Prior Therapy Dates: August 2016-Present Prior Therapy Facilty/Provider(s): [facility] Reason for Treatment: ACTT Does patient have an ACCT team?: Yes Does patient have Intensive In-House Services?  : No Does patient have [facility] services? : No Does patient have P4CC services?: Unknown  ADL Screening (condition at time  of admission) Patient's cognitive ability adequate to safely complete daily activities?: Yes Is the patient deaf or have difficulty hearing?: No Does the patient have difficulty seeing, even when wearing glasses/contacts?: No Does the patient have difficulty concentrating, remembering, or making decisions?: No Patient able to express need for assistance with ADLs?: Yes Does the patient have difficulty dressing or bathing?: No Independently performs ADLs?: Yes (appropriate for developmental age) Does the patient have difficulty walking or climbing stairs?: No Weakness of Legs: None Weakness of Arms/Hands: None  Home Assistive Devices/Equipment Home Assistive Devices/Equipment: None  Therapy Consults (therapy consults require a physician order) PT Evaluation Needed: No OT Evalulation Needed: No SLP Evaluation Needed: No Abuse/Neglect Assessment (Assessment to be complete while patient is alone) Physical Abuse: Denies Verbal Abuse: Denies Sexual Abuse: Denies Exploitation of patient/patient's resources: Denies Self-Neglect: Denies Values / Beliefs Cultural Requests During Hospitalization: None Spiritual Requests During Hospitalization: None Consults Spiritual Care Consult Needed: No Social Work Consult Needed: No Merchant navy officer (For Healthcare) Does patient have an advance directive?: No Would patient like information on creating an advanced directive?: No - patient declined information    Additional Information 1:1 In Past 12 Months?: No CIRT Risk: No Elopement Risk: No     Disposition:  Disposition Initial Assessment Completed for this Encounter: Yes Disposition of Patient: Inpatient treatment program Nanine Means, DNP) Type of inpatient treatment program: Adult  On Site Evaluation by:   Reviewed with Physician:    Yoshie Kosel 09/16/2015 3:59 PM

## 2015-09-16 NOTE — Progress Notes (Signed)
Patient meets inpatient criteria, per Nanine Means, DNP.  Patient was referred to: [facility] - per Aimie, couple beds open. [facility] - per Olegario Messier, will look at referral.  [facility], [facility], [facility] - don't accept adult medicaid.  At capacity: [facility] - [facility] - per Gaylan Gerold - per [facility] - per [facility] - per [facility].  Melbourne Abts, LCSWA Disposition staff 09/16/2015 9:58 PM

## 2015-09-16 NOTE — ED Provider Notes (Signed)
CSN: [id number]     Arrival date & time 09/16/15  1016 History   First MD Initiated Contact with Patient 09/16/15 1114     Chief Complaint  Patient presents with  . Drug Overdose     (Consider location/radiation/quality/duration/timing/severity/associated sxs/prior Treatment) HPI Patient presents to the emergency department with an overdose on Seroquel.  Patient states that he took 8 Seroquel because he wanted to harm himself.  He states he has attempted suicide in the past.  He states that he has family problems and he states that his mother wants to take him out and he would become homeless.  He states this is increased his anxiety and depression.  Patient states that nothing specific condition better.  He states that he does not have any hallucinations, nausea, vomiting, chest pain, shortness breath, weakness, dizziness, headache, blurred vision, back pain, fever, dysuria, incontinence, or syncope.  The patient states that he did not take any other medications.  He denies any drug or alcohol use Past Medical History  Diagnosis Date  . ADHD (attention deficit hyperactivity disorder)   . Unspecified episodic mood disorder   . Oppositional defiant disorder   . Bipolar disorder (HCC)   . Depression    Past Surgical History  Procedure Laterality Date  . No past surgeries     Family History  Problem Relation Age of Onset  . Depression Mother   . Diabetes Other   . Hyperlipidemia Other   . Hypertension Other    Social History  Substance Use Topics  . Smoking status: Current Every Day Smoker -- 0.50 packs/day for 0 years    Types: Cigarettes  . Smokeless tobacco: Never Used  . Alcohol Use: No     Comment: "I actually recently quit"    Review of Systems  All other systems negative except as documented in the HPI. All pertinent positives and negatives as reviewed in the HPI.  Allergies  Peanut-containing drug products and Lactose intolerance (gi)  Home Medications   Prior to  Admission medications   Medication Sig Start Date End Date Taking? Authorizing Provider  albuterol (PROVENTIL HFA;VENTOLIN HFA) 108 (90 BASE) MCG/ACT inhaler Inhale 2 puffs into the lungs every 4 (four) hours as needed for wheezing or shortness of breath. 07/20/15  Yes Sanjuana Kava, NP  citalopram (CELEXA) 10 MG tablet Take 3 tablets (30 mg total) by mouth daily. For depression 07/20/15  Yes Sanjuana Kava, NP  divalproex (DEPAKOTE ER) 500 MG 24 hr tablet Take 3 tablets (1,500 mg total) by mouth at bedtime. For mood stabilization 07/20/15  Yes Sanjuana Kava, NP  QUEtiapine (SEROQUEL) 50 MG tablet Take 1 tablet (50 mg total) by mouth at bedtime. For mood control/insomnia 07/20/15  Yes Sanjuana Kava, NP  cephALEXin (KEFLEX) 500 MG capsule Take 1 capsule (500 mg total) by mouth every 8 (eight) hours. For infection 07/20/15   Sanjuana Kava, NP  nicotine polacrilex (NICORETTE) 2 MG gum Take 1 each (2 mg total) by mouth as needed for smoking cessation. Patient not taking: Reported on 09/12/2015 07/20/15   Sanjuana Kava, NP  omeprazole (PRILOSEC) 20 MG capsule Take 1 capsule (20 mg total) by mouth daily. 09/12/15   Benjiman Core, MD   BP 133/71 mmHg  Pulse 62  Temp(Src) 97.7 F (36.5 C) (Oral)  Resp 14  SpO2 100% Physical Exam  Constitutional: He is oriented to person, place, and time. He appears well-developed and well-nourished. No distress.  HENT:  Head: Normocephalic  and atraumatic.  Mouth/Throat: Oropharynx is clear and moist.  Eyes: Pupils are equal, round, and reactive to light.  Neck: Normal range of motion. Neck supple.  Cardiovascular: Normal rate, regular rhythm and normal heart sounds.  Exam reveals no gallop and no friction rub.   No murmur heard. Pulmonary/Chest: Effort normal and breath sounds normal. No respiratory distress. He has no wheezes.  Abdominal: Soft. Bowel sounds are normal. He exhibits no distension. There is no tenderness.  Neurological: He is alert and oriented to  person, place, and time. He exhibits normal muscle tone. Coordination normal.  Skin: Skin is warm and dry. No rash noted. No erythema.  Psychiatric: He has a normal mood and affect. His speech is normal and behavior is normal. Thought content is not paranoid and not delusional. He expresses suicidal ideation. He expresses no homicidal ideation. He expresses no suicidal plans and no homicidal plans.  Nursing note and vitals reviewed.   ED Course  Procedures (including critical care time) Labs Review Labs Reviewed  COMPREHENSIVE METABOLIC PANEL - Abnormal; Notable for the following:    Glucose, Bld 108 (*)    ALT 15 (*)    All other components within normal limits  ACETAMINOPHEN LEVEL - Abnormal; Notable for the following:    Acetaminophen (Tylenol), Serum <10 (*)    All other components within normal limits  VALPROIC ACID LEVEL - Abnormal; Notable for the following:    Valproic Acid Lvl <10 (*)    All other components within normal limits  ACETAMINOPHEN LEVEL - Abnormal; Notable for the following:    Acetaminophen (Tylenol), Serum <10 (*)    All other components within normal limits  ETHANOL  SALICYLATE LEVEL  CBC  URINE RAPID DRUG SCREEN, HOSP PERFORMED  MAGNESIUM  CBG MONITORING, ED    Imaging Review No results found. I have personally reviewed and evaluated these images and lab results as part of my medical decision-making.  Patient will need TTS assessment for suicidal attempt.  Spoke with poison control and they advised to monitor the patient for several hours.  Also, check levels of Tylenol.  Valproic acid and his liver enzymes  Charlestine Night, PA-C 09/16/15 1559  Loren Racer, MD 09/21/15 1210

## 2015-09-16 NOTE — ED Notes (Signed)
Pt. Noted in room. No complaints or concerns voiced. No distress or abnormal behavior noted. Will continue to monitor with security cameras. Q 15 minute rounds continue. Snack and beverage given. 

## 2015-09-16 NOTE — BH Assessment (Signed)
Assessment completed. Consulted with Nanine Means, DNP who recommends inpatient treatment at this time.    Davina Poke, [name] Therapeutic Triage Specialist [facility] 09/16/2015 3:41 PM

## 2015-09-16 NOTE — ED Notes (Signed)
Nurse will draw blood work

## 2015-09-16 NOTE — ED Notes (Signed)
Pt oriented to room and unit.  Pt has SI but contracts for safety.  15 minute checks and video monitoring continue.

## 2015-09-16 NOTE — ED Notes (Addendum)
Pt. Noted in room asleep;. No complaints or concerns voiced. No distress or abnormal behavior noted. Will continue to monitor with security cameras. Q 15 minute rounds continue. 

## 2015-09-16 NOTE — ED Notes (Signed)
Cheryl from poison control called and cleared pt.

## 2015-09-17 ENCOUNTER — Encounter (HOSPITAL_COMMUNITY): Payer: Self-pay | Admitting: *Deleted

## 2015-09-17 ENCOUNTER — Inpatient Hospital Stay (HOSPITAL_COMMUNITY)
Admission: AD | Admit: 2015-09-17 | Discharge: 2015-09-21 | DRG: 885 | Disposition: A | Discharge status: Home / Self Care | Payer: Medicaid Other | Source: Intra-hospital | Attending: Psychiatry | Admitting: Psychiatry

## 2015-09-17 DIAGNOSIS — R45850 Homicidal ideations: Secondary | ICD-10-CM

## 2015-09-17 DIAGNOSIS — Z818 Family history of other mental and behavioral disorders: Secondary | ICD-10-CM

## 2015-09-17 DIAGNOSIS — G4710 Hypersomnia, unspecified: Secondary | ICD-10-CM | POA: Diagnosis present

## 2015-09-17 DIAGNOSIS — F3130 Bipolar disorder, current episode depressed, mild or moderate severity, unspecified: Secondary | ICD-10-CM | POA: Diagnosis present

## 2015-09-17 DIAGNOSIS — F3131 Bipolar disorder, current episode depressed, mild: Principal | ICD-10-CM | POA: Diagnosis present

## 2015-09-17 DIAGNOSIS — T1491 Suicide attempt: Secondary | ICD-10-CM | POA: Diagnosis not present

## 2015-09-17 DIAGNOSIS — F902 Attention-deficit hyperactivity disorder, combined type: Secondary | ICD-10-CM | POA: Diagnosis present

## 2015-09-17 DIAGNOSIS — T50902A Poisoning by unspecified drugs, medicaments and biological substances, intentional self-harm, initial encounter: Secondary | ICD-10-CM | POA: Insufficient documentation

## 2015-09-17 DIAGNOSIS — F419 Anxiety disorder, unspecified: Secondary | ICD-10-CM | POA: Diagnosis present

## 2015-09-17 DIAGNOSIS — F17210 Nicotine dependence, cigarettes, uncomplicated: Secondary | ICD-10-CM | POA: Diagnosis present

## 2015-09-17 DIAGNOSIS — K219 Gastro-esophageal reflux disease without esophagitis: Secondary | ICD-10-CM | POA: Diagnosis present

## 2015-09-17 DIAGNOSIS — T43592A Poisoning by other antipsychotics and neuroleptics, intentional self-harm, initial encounter: Secondary | ICD-10-CM | POA: Diagnosis not present

## 2015-09-17 DIAGNOSIS — Z833 Family history of diabetes mellitus: Secondary | ICD-10-CM

## 2015-09-17 DIAGNOSIS — Z8249 Family history of ischemic heart disease and other diseases of the circulatory system: Secondary | ICD-10-CM

## 2015-09-17 DIAGNOSIS — Z590 Homelessness: Secondary | ICD-10-CM

## 2015-09-17 DIAGNOSIS — R45851 Suicidal ideations: Secondary | ICD-10-CM | POA: Diagnosis not present

## 2015-09-17 DIAGNOSIS — J45909 Unspecified asthma, uncomplicated: Secondary | ICD-10-CM | POA: Diagnosis present

## 2015-09-17 DIAGNOSIS — G4700 Insomnia, unspecified: Secondary | ICD-10-CM | POA: Diagnosis present

## 2015-09-17 MED ORDER — ZOLPIDEM TARTRATE 5 MG PO TABS
5.0000 mg | ORAL_TABLET | Freq: Every evening | ORAL | Status: DC | PRN
  Administered 2015-09-17 – 2015-09-18 (×2): 5 mg via ORAL
  Filled 2015-09-17 (×2): qty 1

## 2015-09-17 MED ORDER — MAGNESIUM HYDROXIDE 400 MG/5ML PO SUSP: 30.0000 mL | Freq: Every day | ORAL | Status: DC | PRN

## 2015-09-17 MED ORDER — PANTOPRAZOLE SODIUM 40 MG PO TBEC
40.0000 mg | DELAYED_RELEASE_TABLET | Freq: Every day | ORAL | Status: DC
  Administered 2015-09-18 – 2015-09-21 (×4): 40 mg via ORAL
  Filled 2015-09-17 (×7): qty 1

## 2015-09-17 MED ORDER — ACETAMINOPHEN 325 MG PO TABS: 650.0000 mg | ORAL_TABLET | ORAL | Status: DC | PRN

## 2015-09-17 MED ORDER — ONDANSETRON HCL 4 MG PO TABS: 4.0000 mg | ORAL_TABLET | Freq: Three times a day (TID) | ORAL | Status: DC | PRN

## 2015-09-17 MED ORDER — ACETAMINOPHEN 325 MG PO TABS
650.0000 mg | ORAL_TABLET | Freq: Four times a day (QID) | ORAL | Status: DC | PRN
  Administered 2015-09-19 – 2015-09-20 (×2): 650 mg via ORAL
  Filled 2015-09-17 (×2): qty 2

## 2015-09-17 MED ORDER — ALBUTEROL SULFATE HFA 108 (90 BASE) MCG/ACT IN AERS: 2.0000 | INHALATION_SPRAY | RESPIRATORY_TRACT | Status: DC | PRN

## 2015-09-17 MED ORDER — ALUM & MAG HYDROXIDE-SIMETH 200-200-20 MG/5ML PO SUSP: 30.0000 mL | ORAL | Status: DC | PRN

## 2015-09-17 MED ORDER — CITALOPRAM HYDROBROMIDE 20 MG PO TABS
30.0000 mg | ORAL_TABLET | Freq: Every day | ORAL | Status: DC
  Administered 2015-09-18: 30 mg via ORAL
  Filled 2015-09-17 (×3): qty 1

## 2015-09-17 MED ORDER — IBUPROFEN 600 MG PO TABS: 600.0000 mg | ORAL_TABLET | Freq: Three times a day (TID) | ORAL | Status: DC | PRN

## 2015-09-17 MED ORDER — DIVALPROEX SODIUM ER 500 MG PO TB24
1500.0000 mg | ORAL_TABLET | Freq: Every day | ORAL | Status: DC
  Administered 2015-09-17 – 2015-09-20 (×4): 1500 mg via ORAL
  Filled 2015-09-17 (×7): qty 3

## 2015-09-17 NOTE — ED Notes (Signed)
Pt. Noted sleeping in room. No complaints or concerns voiced. No distress or abnormal behavior noted. Will continue to monitor with security cameras. Q 15 minute rounds continue. 

## 2015-09-17 NOTE — ED Notes (Signed)
Pt. Noted in room. No complaints or concerns voiced. No distress or abnormal behavior noted. Will continue to monitor with security cameras. Q 15 minute rounds continue. 

## 2015-09-17 NOTE — ED Notes (Signed)
Report received from Lizbeth Bark RN. Pt. Alert and oriented in no distress denies HI, AVH and pain.  Pt. States he still has some SI without plan. Pt. Instructed to come to me with problems or concerns.Will continue to monitor for safety via security cameras and Q 15 minute checks.

## 2015-09-17 NOTE — ED Notes (Signed)
Dr A and Jamison DNP into see 

## 2015-09-17 NOTE — ED Notes (Signed)
Pt admitted to [facility] OBS after 1900 per TTS

## 2015-09-17 NOTE — ED Notes (Signed)
Up to the bathroom 

## 2015-09-17 NOTE — ED Notes (Signed)
Up to the btahroom

## 2015-09-17 NOTE — Consult Note (Addendum)
[facility] Consult   Reason for Consult:  Depression and suicidal attempt. Referring Physician:  Emergency room physician Patient Identification: Ryan Bartlett MRN:  [id number] Principal Diagnosis: Bipolar I disorder, most recent episode depressed [facility]) Diagnosis:   Patient Active Problem List   Diagnosis Date Noted  . Major depressive disorder, recurrent episode, moderate ([place]) [F33.1] 07/09/2015  . Depression [F32.9]   . Suicidal ideation [R45.851]   . Cannabis use disorder, moderate, dependence ([place]) [F12.20] 06/06/2015  . Alcohol use disorder, moderate, dependence ([place]) [F10.20] 06/06/2015  . Bipolar I disorder, most recent episode depressed ([place]) [F31.30] 05/23/2013  . Cannabis abuse [F12.10] 03/30/2012  . ADHD (attention deficit hyperactivity disorder), combined type [F90.2] 08/28/2011  . Conduct disorder, adolescent onset type [F91.2] 08/28/2011    Total Time spent with patient: 30 minutes  Subjective:   Ryan Bartlett is a 20 y.o. male patient admitted with suicidal attempt.  HPI:  Patient is 20 year old African-American manwho has significant history of psychiatric illness and recently discharged in November 2016 from behavioral [place] into the emergency room after taking intentional overdose of Seroquel.  Patient told he does not want to live anymore.  He is complaining of severe depression, agitation, having arguments with his sister and calling him names  He admitted not getting along with family members and he does not feel  Medicine working.  Later he admitted noncompliant with Depakote.  His Depakote level is less than 10.  Patient went to the grocery store and [facility] duty officer and informed him  About his suicidal attempt who call 911 and patient transported to the emergency room.  Patient admitted lately poor sleep, irritability, paranoia, and persistent passive suicidal thought sand plan to kill himself. He also expressedhomicidal thoughts  towards his sister  Patient also told he tried to cut his neck.  Past Psychiatric History: Patient has multiple psychiatric hospitalization.  He has been in and out from the [facility]..  He has history of impulsivity, suicidal attempt, aggressive behavior.    Risk to Self: Suicidal Ideation: Yes-Currently Present Suicidal Intent: Yes-Currently Present Is patient at risk for suicide?: Yes Suicidal Plan?: Yes-Currently Present Specify Current Suicidal Plan: overdose Access to Means: Yes Specify Access to Suicidal Means: pills What has been your use of drugs/alcohol within the last 12 months?: THC daily How many times?:  ("a lot") Other Self Harm Risks: Denies Triggers for Past Attempts: Other (Comment) ("the way I'm treated at home") Intentional Self Injurious Behavior: None Risk to Others: Homicidal Ideation: No Thoughts of Harm to Others: No Current Homicidal Intent: No Current Homicidal Plan: No Access to Homicidal Means: No Identified Victim: Denies History of harm to others?: No Assessment of Violence: None Noted Violent Behavior Description: Denies Does patient have access to weapons?: No Criminal Charges Pending?: Yes Describe Pending Criminal Charges: violation of probation Does patient have a court date: Yes Court Date: 10/14/15 Prior Inpatient Therapy: Prior Inpatient Therapy: Yes Prior Therapy Dates: 06/2015, 05/2015, 2015 Prior Therapy Facilty/Provider(s): [facility] Reason for Treatment: SI Prior Outpatient Therapy: Prior Outpatient Therapy: Yes Prior Therapy Dates: August 2016-Present Prior Therapy Facilty/Provider(s): [facility] Reason for Treatment: ACTT Does patient have an ACCT team?: Yes Does patient have Intensive In-House Services?  : No Does patient have [facility] services? : No Does patient have P4CC services?: Unknown  Past Medical History:  Past Medical History  Diagnosis Date  . ADHD (attention deficit hyperactivity disorder)   .  Unspecified episodic mood disorder   . Oppositional  defiant disorder   . Bipolar disorder ([place])   . Depression     Past Surgical History  Procedure Laterality Date  . No past surgeries     Family History:  Family History  Problem Relation Age of Onset  . Depression Mother   . Diabetes Other   . Hyperlipidemia Other   . Hypertension Other    Family Psychiatric  History: see above Social History:  History  Alcohol Use No    Comment: "I actually recently quit"     History  Drug Use No    Social History   Social History  . Marital Status: Single    Spouse Name: N/A  . Number of Children: N/A  . Years of Education: N/A   Occupational History  . student     12th grade at [employer]  . Smoking status: Current Every Day Smoker -- 0.50 packs/day for 0 years    Types: Cigarettes  . Smokeless tobacco: Never Used  . Alcohol Use: No     Comment: "I actually recently quit"  . Drug Use: No  . Sexual Activity: Not Asked     Comment: Pt reports that he is not sexually active   Other Topics Concern  . None   Social History Narrative   Additional Social History:    Pain Medications: See PTA Prescriptions: See PTA Over the Counter: See PTA History of alcohol / drug use?: Yes Name of Substance 1: THC 1 - Age of First Use: 12 1 - Amount (size/oz): UKN 1 - Frequency: once a month 1 - Duration: ongoing 1 - Last Use / Amount: last night, .5 blunt                   Allergies:   Allergies  Allergen Reactions  . Peanut-Containing Drug Products Anaphylaxis  . Lactose Intolerance (Gi) Diarrhea and Nausea And Vomiting    Labs:  Results for orders placed or performed during the hospital encounter of 09/16/15 (from the past 48 hour(s))  Urine rapid drug screen (hosp performed) (Not at [facility])     Status: None   Collection Time: 09/16/15 10:47 AM  Result Value Ref Range   Opiates NONE DETECTED NONE DETECTED   Cocaine NONE DETECTED NONE  DETECTED   Benzodiazepines NONE DETECTED NONE DETECTED   Amphetamines NONE DETECTED NONE DETECTED   Tetrahydrocannabinol NONE DETECTED NONE DETECTED   Barbiturates NONE DETECTED NONE DETECTED    Comment:        DRUG SCREEN FOR MEDICAL PURPOSES ONLY.  IF CONFIRMATION IS NEEDED FOR ANY PURPOSE, NOTIFY LAB WITHIN 5 DAYS.        LOWEST DETECTABLE LIMITS FOR URINE DRUG SCREEN Drug Class       Cutoff (ng/mL) Amphetamine      1000 Barbiturate      200 Benzodiazepine   790 Tricyclics       240 Opiates          300 Cocaine          300 THC              50   CBG monitoring, ED     Status: None   Collection Time: 09/16/15 10:49 AM  Result Value Ref Range   Glucose-Capillary 99 65 - 99 mg/dL  Comprehensive metabolic panel     Status: Abnormal   Collection Time: 09/16/15 11:07 AM  Result Value Ref Range   Sodium 142 135 -  145 mmol/L   Potassium 4.3 3.5 - 5.1 mmol/L   Chloride 110 101 - 111 mmol/L   CO2 26 22 - 32 mmol/L   Glucose, Bld 108 (H) 65 - 99 mg/dL   BUN 7 6 - 20 mg/dL   Creatinine, Ser 0.83 0.61 - 1.24 mg/dL   Calcium 9.5 8.9 - 10.3 mg/dL   Total Protein 7.7 6.5 - 8.1 g/dL   Albumin 4.2 3.5 - 5.0 g/dL   AST 18 15 - 41 U/L   ALT 15 (L) 17 - 63 U/L   Alkaline Phosphatase 62 38 - 126 U/L   Total Bilirubin 0.4 0.3 - 1.2 mg/dL   GFR calc non Af Amer >60 >60 mL/min   GFR calc Af Amer >60 >60 mL/min    Comment: (NOTE) The eGFR has been calculated using the CKD EPI equation. This calculation has not been validated in all clinical situations. eGFR's persistently <60 mL/min signify possible Chronic Kidney Disease.    Anion gap 6 5 - 15  Ethanol (ETOH)     Status: None   Collection Time: 09/16/15 11:07 AM  Result Value Ref Range   Alcohol, Ethyl (B) <5 <5 mg/dL    Comment:        LOWEST DETECTABLE LIMIT FOR SERUM ALCOHOL IS 5 mg/dL FOR MEDICAL PURPOSES ONLY   Salicylate level     Status: None   Collection Time: 09/16/15 11:07 AM  Result Value Ref Range   Salicylate  Lvl <9.6 2.8 - 30.0 mg/dL  Acetaminophen level     Status: Abnormal   Collection Time: 09/16/15 11:07 AM  Result Value Ref Range   Acetaminophen (Tylenol), Serum <10 (L) 10 - 30 ug/mL    Comment:        THERAPEUTIC CONCENTRATIONS VARY SIGNIFICANTLY. A RANGE OF 10-30 ug/mL MAY BE AN EFFECTIVE CONCENTRATION FOR MANY PATIENTS. HOWEVER, SOME ARE BEST TREATED AT CONCENTRATIONS OUTSIDE THIS RANGE. ACETAMINOPHEN CONCENTRATIONS >150 ug/mL AT 4 HOURS AFTER INGESTION AND >50 ug/mL AT 12 HOURS AFTER INGESTION ARE OFTEN ASSOCIATED WITH TOXIC REACTIONS.   CBC     Status: None   Collection Time: 09/16/15 11:07 AM  Result Value Ref Range   WBC 6.4 4.0 - 10.5 K/uL   RBC 5.13 4.22 - 5.81 MIL/uL   Hemoglobin 14.3 13.0 - 17.0 g/dL   HCT 42.9 39.0 - 52.0 %   MCV 83.6 78.0 - 100.0 fL   MCH 27.9 26.0 - 34.0 pg   MCHC 33.3 30.0 - 36.0 g/dL   RDW 13.4 11.5 - 15.5 %   Platelets 203 150 - 400 K/uL  Valproic acid level     Status: Abnormal   Collection Time: 09/16/15 11:07 AM  Result Value Ref Range   Valproic Acid Lvl <10 (L) 50.0 - 100.0 ug/mL    Comment: RESULTS CONFIRMED BY MANUAL DILUTION  Magnesium     Status: None   Collection Time: 09/16/15 11:07 AM  Result Value Ref Range   Magnesium 1.9 1.7 - 2.4 mg/dL  Acetaminophen level     Status: Abnormal   Collection Time: 09/16/15  1:00 PM  Result Value Ref Range   Acetaminophen (Tylenol), Serum <10 (L) 10 - 30 ug/mL    Comment:        THERAPEUTIC CONCENTRATIONS VARY SIGNIFICANTLY. A RANGE OF 10-30 ug/mL MAY BE AN EFFECTIVE CONCENTRATION FOR MANY PATIENTS. HOWEVER, SOME ARE BEST TREATED AT CONCENTRATIONS OUTSIDE THIS RANGE. ACETAMINOPHEN CONCENTRATIONS >150 ug/mL AT 4 HOURS AFTER INGESTION AND >50 ug/mL AT  12 HOURS AFTER INGESTION ARE OFTEN ASSOCIATED WITH TOXIC REACTIONS.     Current Facility-Administered Medications  Medication Dose Route Frequency Provider Last Rate Last Dose  . acetaminophen (TYLENOL) tablet 650 mg  650 mg  Oral Q4H PRN Dalia Heading, PA-C      . albuterol (PROVENTIL HFA;VENTOLIN HFA) 108 (90 Base) MCG/ACT inhaler 2 puff  2 puff Inhalation Q4H PRN Dalia Heading, PA-C      . alum & mag hydroxide-simeth (MAALOX/MYLANTA) 200-200-20 MG/5ML suspension 30 mL  30 mL Oral PRN Dalia Heading, PA-C      . citalopram (CELEXA) tablet 30 mg  30 mg Oral Daily [employer], PA-C   30 mg at 09/17/15 0942  . divalproex (DEPAKOTE ER) 24 hr tablet 1,500 mg  1,500 mg Oral QHS Dalia Heading, PA-C   1,500 mg at 09/16/15 2108  . ibuprofen (ADVIL,MOTRIN) tablet 600 mg  600 mg Oral Q8H PRN Dalia Heading, PA-C      . ondansetron [facility]) tablet 4 mg  4 mg Oral Q8H PRN Dalia Heading, PA-C      . pantoprazole (PROTONIX) EC tablet 40 mg  40 mg Oral Daily [employer], PA-C   40 mg at 09/17/15 0940  . zolpidem (AMBIEN) tablet 5 mg  5 mg Oral QHS PRN Dalia Heading, PA-C       Current Outpatient Prescriptions  Medication Sig Dispense Refill  . albuterol (PROVENTIL HFA;VENTOLIN HFA) 108 (90 BASE) MCG/ACT inhaler Inhale 2 puffs into the lungs every 4 (four) hours as needed for wheezing or shortness of breath.    . citalopram (CELEXA) 10 MG tablet Take 3 tablets (30 mg total) by mouth daily. For depression 90 tablet 0  . divalproex (DEPAKOTE ER) 500 MG 24 hr tablet Take 3 tablets (1,500 mg total) by mouth at bedtime. For mood stabilization 90 tablet 0  . QUEtiapine (SEROQUEL) 50 MG tablet Take 1 tablet (50 mg total) by mouth at bedtime. For mood control/insomnia 30 tablet 0  . cephALEXin (KEFLEX) 500 MG capsule Take 1 capsule (500 mg total) by mouth every 8 (eight) hours. For infection 11 capsule 0  . nicotine polacrilex (NICORETTE) 2 MG gum Take 1 each (2 mg total) by mouth as needed for smoking cessation. (Patient not taking: Reported on 09/12/2015) 100 tablet 0  . omeprazole (PRILOSEC) 20 MG capsule Take 1 capsule (20 mg total) by mouth daily. 7 capsule 0    Musculoskeletal: Strength  & Muscle Tone: within normal limits Gait & Station: normal Patient leans: N/A  Psychiatric Specialty Exam: Review of Systems  Neurological: Negative for dizziness and tremors.  Psychiatric/Behavioral: Positive for depression and suicidal ideas. The patient is nervous/anxious and has insomnia.     Blood pressure 115/55, pulse 60, temperature 98 F (36.7 C), temperature source Oral, resp. rate 16, SpO2 97 %.There is no weight on file to calculate BMI.  General Appearance: Fairly Groomed and Guarded  Engineer, water::  Fair  Speech:  Slow  Volume:  Decreased  Mood:  Depressed, Dysphoric, Hopeless and Irritable  Affect:  Constricted and Depressed  Thought Process:  Coherent  Orientation:  Full (Time, Place, and Person)  Thought Content:  Paranoid Ideation and Rumination  Suicidal Thoughts:  Yes.  with intent/plan  Homicidal Thoughts:  Yes.  without intent/plan  Memory:  Immediate;   Fair Recent;   Fair Remote;   Fair  Judgement:  Impaired  Insight:  Lacking  Psychomotor Activity:  Increased and Restlessness  Concentration:  Fair  Recall:  Fair  Fund of Knowledge:Fair  Language: Fair  Akathisia:  No  Handed:  Right  AIMS (if indicated):     Assets:  Communication Skills Desire for Improvement  ADL's:  Intact  Cognition: WNL  Sleep:      Treatment Plan Summary: Plan Patient requires inpatient treatment, Restart Depakote  Disposition: Recommend psychiatric Inpatient admission when medically cleared.  ARFEEN,SYED T. 09/17/2015 1:06 PM

## 2015-09-18 DIAGNOSIS — R45851 Suicidal ideations: Secondary | ICD-10-CM

## 2015-09-18 DIAGNOSIS — F17210 Nicotine dependence, cigarettes, uncomplicated: Secondary | ICD-10-CM | POA: Diagnosis present

## 2015-09-18 DIAGNOSIS — Z590 Homelessness: Secondary | ICD-10-CM | POA: Diagnosis not present

## 2015-09-18 DIAGNOSIS — G4710 Hypersomnia, unspecified: Secondary | ICD-10-CM | POA: Diagnosis present

## 2015-09-18 DIAGNOSIS — F3130 Bipolar disorder, current episode depressed, mild or moderate severity, unspecified: Secondary | ICD-10-CM | POA: Diagnosis not present

## 2015-09-18 DIAGNOSIS — Z833 Family history of diabetes mellitus: Secondary | ICD-10-CM | POA: Diagnosis not present

## 2015-09-18 DIAGNOSIS — Z8249 Family history of ischemic heart disease and other diseases of the circulatory system: Secondary | ICD-10-CM | POA: Diagnosis not present

## 2015-09-18 DIAGNOSIS — F419 Anxiety disorder, unspecified: Secondary | ICD-10-CM | POA: Diagnosis present

## 2015-09-18 DIAGNOSIS — G4700 Insomnia, unspecified: Secondary | ICD-10-CM | POA: Diagnosis present

## 2015-09-18 DIAGNOSIS — K219 Gastro-esophageal reflux disease without esophagitis: Secondary | ICD-10-CM | POA: Diagnosis present

## 2015-09-18 DIAGNOSIS — Z818 Family history of other mental and behavioral disorders: Secondary | ICD-10-CM | POA: Diagnosis not present

## 2015-09-18 DIAGNOSIS — F3131 Bipolar disorder, current episode depressed, mild: Secondary | ICD-10-CM | POA: Diagnosis present

## 2015-09-18 DIAGNOSIS — F902 Attention-deficit hyperactivity disorder, combined type: Secondary | ICD-10-CM | POA: Diagnosis present

## 2015-09-18 DIAGNOSIS — J45909 Unspecified asthma, uncomplicated: Secondary | ICD-10-CM | POA: Diagnosis present

## 2015-09-18 MED ORDER — NICOTINE POLACRILEX 2 MG MT GUM
2.0000 mg | CHEWING_GUM | OROMUCOSAL | Status: DC | PRN
  Administered 2015-09-18 – 2015-09-20 (×7): 2 mg via ORAL
  Filled 2015-09-18 (×4): qty 1

## 2015-09-18 MED ORDER — CITALOPRAM HYDROBROMIDE 10 MG PO TABS
30.0000 mg | ORAL_TABLET | Freq: Every day | ORAL | Status: DC
  Administered 2015-09-19 – 2015-09-21 (×3): 30 mg via ORAL
  Filled 2015-09-18 (×6): qty 3
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
## 2015-09-18 NOTE — Progress Notes (Signed)
Patient did attend the evening speaker AA meeting.  

## 2015-09-18 NOTE — [facility] Group Notes (Signed)
[facility] Group Notes: (Nursing/MHT/Case Management/Adjunct)  Date: 09/18/2015  Time: 2:15 PM  Type of Therapy: Nurse Education  Participation Level: Minimal  Participation Quality: Attentive  Affect: Flat  Cognitive: Lacking  Insight: Limited  Engagement in Group: Limited  Modes of Intervention: Discussion and Education  Summary of Progress/Problems: Group topic was "Healthy Support Systems." Discussed coping skills and learning how to effectively communicate with others and being respectful. Ryan Bartlett sat quietly in group but did not participate as he was just admitted to the unit.  Ryan Bartlett 09/18/2015, 3:03 PM

## 2015-09-18 NOTE — Tx Team (Addendum)
Initial Interdisciplinary Treatment Plan   PATIENT STRESSORS: Marital or family conflict   PATIENT STRENGTHS: Wellsite geologist fund of knowledge Physical Health   PROBLEM LIST: Problem List/Patient Goals Date to be addressed Date deferred Reason deferred Estimated date of resolution  Suicidal Ideation 09/18/15     Depression 09/18/15           "sleep well" 09/18/15 BC 09/18/15                                    DISCHARGE CRITERIA:  Improved stabilization in mood, thinking, and/or behavior Verbal commitment to aftercare and medication compliance  PRELIMINARY DISCHARGE PLAN: Outpatient therapy Return to previous living arrangement Return to previous work or school arrangements  PATIENT/FAMIILY INVOLVEMENT: This treatment plan has been presented to and reviewed with the patient, Ryan Bartlett, and/or family member.  The patient and family have been given the opportunity to ask questions and make suggestions.  [name] T 09/18/2015, 3:46 PM

## 2015-09-18 NOTE — Progress Notes (Signed)
Patient states that he was walking in the hallway and slipped and fell to his bottom. He denies hitting his head and denies any pain. Patient given high fall risk socks. Also made him a high fall risk. Charge nurse notified. Provider Renata Caprice, notified. AC notified. Vital signs taken. Will continue to monitor pt.

## 2015-09-18 NOTE — Progress Notes (Signed)
Admission Note:  Patient is a 20 years old male who voluntarily present to [facility] after an intentional overdose on Seroquel (100 mg x 8) On admission, patient presents with a blunt affect, appearance dishevel, unkept and body odor. Cooperative with the admission process. Patient reported going through "alot" in his life and the family not helping and not supportive makes it hard for him. patient stated "I tried killing myself to take away the pain and be over it. Patient reports using marijuana occasionally, cocaine last use x 2 weeks ago and 1/2 pack of cigarette/day. Denies pain, SI, AH/VH at this moment. No behavioral issues noted.  A: Skin assessed, no tattoo, wounds, bruises noted. Skin intact. POC and unit policies explained and understanding verbalized. Consents obtained. Accepted food and fluids offered.  R: Patient had no additional questions or concerns.

## 2015-09-18 NOTE — Progress Notes (Signed)
Patient ID: Ryan Bartlett, male   DOB: [birth year], 20 y.o.   MRN: [id number]  20 year old male admitted for a suicide attempt by OD. Pt attempted to OD on (8)  Seroquel tablets. He lists his "family" as his main stressor. He doesn't like how he is treated at home and wants to go live with a friend upon discharge. He currently denies SI but endorses passive SI previously. He was last admitted to [facility] in November 2016. He has a previous history of ADHD, bipolar disorder, and ODD. Pt also states that he has had a schizoaffective disorder diagnosis in the past. When asked, he denies ever having any auditory/visual hallucinations. Consents signed and pt verbalized understanding. Skin visually assessed and belongings searched. Pt oriented to unit. No complaints of pain or discomfort at this time. Q15 min checks maintained. Will continue to monitor pt.

## 2015-09-19 ENCOUNTER — Encounter (HOSPITAL_COMMUNITY): Payer: Self-pay | Admitting: Psychiatry

## 2015-09-19 MED ORDER — ZOLPIDEM TARTRATE 10 MG PO TABS
10.0000 mg | ORAL_TABLET | Freq: Every evening | ORAL | Status: DC | PRN
  Administered 2015-09-19 – 2015-09-20 (×2): 10 mg via ORAL
  Filled 2015-09-19 (×2): qty 1

## 2015-09-19 NOTE — Progress Notes (Addendum)
Patient ID: Ryan Bartlett, male   DOB: 1995/10/17, 20 y.o.   MRN: [id number]  Pt currently presents with an animated affect and impulsive behavior. Per self inventory, pt rates depression at a 9, hopelessness 8 and anxiety 6. Pt's daily goal is to "I dunno." Pt reports poor sleep, a fair appetite, normal energy and poor concentration. Pt superficial in interaction with writer, pt forwards little.   Pt provided with medications per providers orders. Pt's labs and vitals were monitored throughout the day. Pt supported emotionally and encouraged to express concerns and questions. Pt educated on medications. Fall bundle worksheet requirements completed, see Fall bundle booklet.   Pt's safety ensured with 15 minute and environmental checks. Pt currently endorses SI on his self inventory sheet but tells writer he does not have SI "right now." Pt Pt currently denies HI and A/V hallucinations. Pt verbally agrees to seek staff if SI/HI or A/VH occurs and to consult with staff before acting on any harmful thoughts. Pt seen laughing and smiling with male pt on the hall today. Will continue POC.

## 2015-09-19 NOTE — Plan of Care (Signed)
Problem: Diagnosis: Increased Risk For Suicide Attempt Goal: LTG-Patient Will Show Positive Response to Medication LTG (by discharge) : Patient will show positive response to medication and will participate in the development of the discharge plan.  Outcome: Progressing Pt behavior more cooperative this afternoon, not as impulsive as previously on unit

## 2015-09-19 NOTE — Progress Notes (Signed)
CSW spoke with Pt's ACT Team, [facility] [phone]) to inform them of pt's hospitalization. Member of ACT team will visit with pt prior to his discharge.   Trula Slade, MSW, [name] Clinical Social Worker 09/19/2015 4:12 PM

## 2015-09-19 NOTE — Plan of Care (Signed)
Problem: Diagnosis: Increased Risk For Suicide Attempt Goal: STG-Patient Will Comply With Medication Regime Outcome: Progressing Pt has been compliant with scheduled medication tonight.       

## 2015-09-19 NOTE — Progress Notes (Signed)
Pt was in group but left out early.

## 2015-09-19 NOTE — [facility] Suicide Risk Assessment (Signed)
[facility] Admission Suicide Risk Assessment   Nursing information obtained from:    Demographic factors:    Current Mental Status:    Loss Factors:    Historical Factors:    Risk Reduction Factors:     Total Time spent with patient: 45 minutes Principal Problem: Bipolar I disorder, most recent episode depressed (HCC) Diagnosis:   Patient Active Problem List   Diagnosis Date Noted  . Bipolar affect, depressed (HCC) [F31.30] 09/18/2015  . Bipolar affective disorder, currently depressed, mild (HCC) [F31.31] 09/17/2015  . Intentional overdose of drug in tablet form (HCC) [T50.902A]   . Major depressive disorder, recurrent episode, moderate (HCC) [F33.1] 07/09/2015  . Depression [F32.9]   . Suicidal ideation [R45.851]   . Cannabis use disorder, moderate, dependence (HCC) [F12.20] 06/06/2015  . Alcohol use disorder, moderate, dependence (HCC) [F10.20] 06/06/2015  . Bipolar I disorder, most recent episode depressed (HCC) [F31.30] 05/23/2013  . Cannabis abuse [F12.10] 03/30/2012  . ADHD (attention deficit hyperactivity disorder), combined type [F90.2] 08/28/2011  . Conduct disorder, adolescent onset type [F91.2] 08/28/2011   Subjective Data: see admission H and P  Continued Clinical Symptoms:  Alcohol Use Disorder Identification Test Final Score (AUDIT): 2 The "Alcohol Use Disorders Identification Test", Guidelines for Use in Primary Care, Second Edition.  World Science writer [facility]). Score between 0-7:  no or low risk or alcohol related problems. Score between 8-15:  moderate risk of alcohol related problems. Score between 16-19:  high risk of alcohol related problems. Score 20 or above:  warrants further diagnostic evaluation for alcohol dependence and treatment.   CLINICAL FACTORS:   Bipolar Disorder:   Depressive phase Depression:   Impulsivity  Psychiatric Specialty Exam: ROS  Blood pressure 135/80, pulse 67, temperature 98.1 F (36.7 C), temperature source Oral, resp. rate  20, height  (1.778 m), weight 96.616 kg (213 lb), SpO2 98 %.Body mass index is 30.56 kg/(m^2).  COGNITIVE FEATURES THAT CONTRIBUTE TO RISK:  Closed-mindedness, Polarized thinking and Thought constriction (tunnel vision)    SUICIDE RISK:   Moderate:  Frequent suicidal ideation with limited intensity, and duration, some specificity in terms of plans, no associated intent, good self-control, limited dysphoria/symptomatology, some risk factors present, and identifiable protective factors, including available and accessible social support.  PLAN OF CARE: see admission H and P  I certify that inpatient services furnished can reasonably be expected to improve the patient's condition.   Rachael Fee, MD 09/19/2015, 3:56 PM

## 2015-09-19 NOTE — [facility] Group Notes (Signed)
[facility] [name] Aftercare Discharge Planning Group Note   09/19/2015 10:42 AM  Participation Quality:  Invited. DID NOT ATTEND. Pt chose to remain in bed.   Smart, Daylen Lipsky [name]

## 2015-09-19 NOTE — Progress Notes (Signed)
D: Pt has blunted affect and depressed mood.  He reports his day was "all right" and that his goal is to "sleep well."  Pt was asked about the fall he had earlier on previous shift and he reported "I just slipped."  Pt has normal range of motion, no abnormal findings on neurological assessment; pt denies pain.  Pt's gait is steady.  Pt reports passive SI without a plan and verbally contracts for safety.  Pt denies HI, denies hallucinations.  Pt has been visible in milieu interacting with peers and staff appropriately.  Pt attended evening group.   A: Introduced self to pt.  Met with pt 1:1 and provided support and encouragement.  Actively listened to pt.  Fall prevention techniques reviewed with pt; pt verbalized understanding.    Medications administered per order.  PRN medication administered for sleep. R: Pt is compliant with medications.  Pt verbally contracts for safety.  Will continue to monitor and assess.
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
## 2015-09-19 NOTE — [facility] Group Notes (Signed)
[facility] [name] Group Therapy  09/19/2015 3:28 PM  Type of Therapy:  Group Therapy  Participation Level:  Minimal  Participation Quality:  Inattentive  Affect:  Irritable  Cognitive:  Oriented  Insight:  Limited  Engagement in Therapy:  None  Modes of Intervention:  Confrontation, Discussion, Education, Exploration, Problem-solving, Rapport Building, Socialization and Support  Summary of Progress/Problems: Today's Topic: Overcoming Obstacles. Patients identified one short term goal and potential obstacles in reaching this goal. Patients processed barriers involved in overcoming these obstacles. Patients identified steps necessary for overcoming these obstacles and explored motivation (internal and external) for facing these difficulties head on. Ryan Bartlett was inattentive and disengaged during today's processing group. He did not want to share an obstacle with the group and had to be asked to stop side conversations twice. At this time, he does not appear to be progressing in the group setting and demonstrates minimal insight.   Smart, Sanela Evola [name] 09/19/2015, 3:28 PM
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
## 2015-09-20 NOTE — Progress Notes (Signed)
Patient ID: Ryan Bartlett, male   DOB: [birth year], 20 y.o.   MRN: [id number]  Adult Psychoeducational Group Note  Date:  09/20/2015 Time: 10:50am  Group Topic/Focus:  Orientation:   The focus of this group is to educate the patient on the purpose and policies of crisis stabilization and provide a format to answer questions about their admission.  The group details unit policies and expectations of patients while admitted.  Participation Level:  Active  Participation Quality:  Appropriate  Affect:  Appropriate  Cognitive:  Appropriate  Insight: Appropriate  Engagement in Group:  Limited  Modes of Intervention:  Education, Orientation and Support  Additional Comments:  Limited participation in group today.   Aurora Mask 09/20/2015, 11:54 AM

## 2015-09-20 NOTE — Progress Notes (Signed)
Recreation Therapy Notes  Animal-Assisted Activity (AAA) Program Checklist/Progress Notes Patient Eligibility Criteria Checklist & Daily Group note for Rec Tx Intervention  Date: 01.31.2017 Time: 2:45pm Location: [address]    AAA/T Program Assumption of Risk Form signed by Patient/ or Parent Legal Guardian  Yes  Patient is free of allergies or sever asthma yes  Patient reports no fear of animals yes  Patient reports no history of cruelty to animals yes  Patient understands his/her participation is voluntary yes  Patient washes hands before animal contact yes  Patient washes hands after animal contact yes  Behavioral Response: Appropriate   Education: Hand Washing, Appropriate Animal Interaction   Education Outcome: Acknowledges education.   Clinical Observations/Feedback: Patient interacted appropriately with therapy dog and peers during session. Additionally he shared stories about his pets at home with peers in session.   [name] [name], LRT/CTRS  [name] 09/20/2015 3:24 PM

## 2015-09-20 NOTE — Progress Notes (Signed)
Adult Psychoeducational Group Note  Date:  09/20/2015 Time:  9:28 PM  Group Topic/Focus:  Wrap-Up Group:   The focus of this group is to help patients review their daily goal of treatment and discuss progress on daily workbooks.  Participation Level:  Active  Participation Quality:  Appropriate  Affect:  Appropriate  Cognitive:  Alert  Insight: Appropriate  Engagement in Group:  Engaged  Modes of Intervention:  Discussion  Additional Comments:  Patient goal for today was to not get distracted.  On a scale between 1-10 (1=worst, 10=best) patient rated his day as a 5 because of "personal reasons".   Jorgen Wolfinger L Audryanna Zurita 09/20/2015, 9:28 PM
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
## 2015-09-20 NOTE — Plan of Care (Signed)
Problem: Ineffective individual coping Goal: STG: Patient will remain free from self harm Outcome: Progressing Pt has remained free from harm since his admission.

## 2015-09-20 NOTE — Progress Notes (Signed)
Patient ID: Ryan Bartlett, male   DOB: March 04, 1996, 20 y.o.   MRN: [id number]   Pt currently presents with a animated affect and impulsive behavior. Per self inventory, pt rates depression at a 8 and hopelessness 9. Pt's daily goal is to "talk to P.O about somewhere to stay" and they intend to do so by "call P.O." Pt reports poor sleep, a poor appetite, normal energy and poor concentration.   Pt provided with medications per providers orders. Pt's labs and vitals were monitored throughout the day. Pt supported emotionally and encouraged to express concerns and questions. Pt educated on medications and treatment agreement rules.   Pt's safety ensured with 15 minute and environmental checks. Pt endorses passive SI, states he has SI "sometimes" but that he can "contract for safety." Pt currently denies HI and A/V hallucinations. Pt verbally agrees to seek staff if HI or A/VH occurs and to consult with staff before acting on harmful thoughts. Will continue POC.

## 2015-09-20 NOTE — [facility] Suicide Risk Assessment (Signed)
[facility] INPATIENT:  Family/Significant Other Suicide Prevention Education  Suicide Prevention Education:  Patient Refusal for Family/Significant Other Suicide Prevention Education: The patient Ryan Bartlett has refused to provide written consent for family/significant other to be provided Family/Significant Other Suicide Prevention Education during admission and/or prior to discharge.  Physician notified.  SPE completed with pt, as pt refused to consent to family contact. SPI pamphlet provided to pt and pt was encouraged to share information with support network, ask questions, and talk about any concerns relating to SPE. Pt denies access to guns/firearms and verbalized understanding of information provided. Mobile Crisis information also provided to pt.   Smart, Shonteria Abeln [name] 09/20/2015, 3:48 PM
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
## 2015-09-21 MED ORDER — ZOLPIDEM TARTRATE 10 MG PO TABS: 10.0000 mg | ORAL_TABLET | Freq: Every evening | ORAL | Status: DC | PRN

## 2015-09-21 MED ORDER — ALBUTEROL SULFATE HFA 108 (90 BASE) MCG/ACT IN AERS: 2.0000 | INHALATION_SPRAY | RESPIRATORY_TRACT | Status: DC | PRN

## 2015-09-21 MED ORDER — DIVALPROEX SODIUM ER 500 MG PO TB24: 1500.0000 mg | ORAL_TABLET | Freq: Every day | ORAL | Status: DC

## 2015-09-21 MED ORDER — OMEPRAZOLE 20 MG PO CPDR: 20.0000 mg | DELAYED_RELEASE_CAPSULE | Freq: Every day | ORAL | Status: DC

## 2015-09-21 MED ORDER — NICOTINE POLACRILEX 2 MG MT GUM: 2.0000 mg | CHEWING_GUM | OROMUCOSAL | Status: DC | PRN

## 2015-09-21 MED ORDER — CITALOPRAM HYDROBROMIDE 10 MG PO TABS: 30.0000 mg | ORAL_TABLET | Freq: Every day | ORAL | Status: DC
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
## 2015-09-21 NOTE — Progress Notes (Signed)
Patient ID: Ryan Bartlett, male   DOB: [birth year], 20 y.o.   MRN: [id number]  Pt discharged home with ACT team member. Pt was stable and appreciative at that time. All papers and prescriptions were given and valuables returned. Verbal understanding expressed. Denies SI/HI and A/VH. Pt given opportunity to express concerns and ask questions.

## 2015-09-21 NOTE — Progress Notes (Signed)
  [facility] Adult Case Management Discharge Plan :  Will you be returning to the same living situation after discharge:  No.Pt going to [facility] for shelter placement. At discharge, do you have transportation home?: Yes,  [facility] will pick up pt at 1pm "sharp" Do you have the ability to pay for your medications: Yes,  [facility] Medicaid  Release of information consent forms completed and submitted to medical records by CSW.  Patient to Follow up at: Follow-up Information    Follow up with [facility].   Why:  ACT team will pick you up at 1:00PM and will transport you to the [facility] for shelter placement.    Contact information:   [address] [place], [place] [address] Phone: [phone] Fax: [phone]      Next level of care provider has access to [facility]:no  Safety Planning and Suicide Prevention discussed: Yes,  SPE completed with pt, as he refused to consent to family contact.   Have you used any form of tobacco in the last 30 days? (Cigarettes, Smokeless Tobacco, Cigars, and/or Pipes): Yes  Has patient been referred to the Quitline?: Patient refused referral  Patient has been referred for addiction treatment: N/A  Smart, Hildagarde Holleran [name] 09/21/2015, 10:50 AM

## 2015-09-21 NOTE — Progress Notes (Signed)
D: Pt has blunted affect and he describes his mood as "all right."  He has been hyperactive and attention-seeking tonight.  He is laughing and joking with peers and frequently goes in and out of the day room.  Dat reports passive SI without a plan.  He verbally contracts for safety.  Pt denies HI, denies hallucinations, reports chronic right knee pain of 10/10.  Pt's gait is steady.  Pt attended evening group.   A:  Met with pt 1:1 and offered support and encouragement.  Actively listened to pt.  Medications administered per order.  PRN medication administered for sleep.  Fall prevention techniques reviewed with pt.  Pt verbalized understanding.   R: Pt is compliant with medications.  Pt verbally contracts for safety.  Will continue to monitor and assess.

## 2015-09-21 NOTE — Progress Notes (Signed)
Patient ID: Ryan Bartlett, male   DOB: [birth year], 20 y.o.   MRN: [id number]  Pt currently presents with a masked affect and impulsive behavior. Pt stays in bed this morning, reports that she slept "really well" last night. Per self inventory, pt rates depression at a 1, hopelessness 9 and anxiety 10. Pt's daily goal is to "be discharged." Pt reports fair sleep, a fair appetite, normal energy and good concentration.   Pt provided with medications per providers orders. Pt's labs and vitals were monitored throughout the day. Pt supported emotionally and encouraged to express concerns and questions. Pt educated on medications.  Pt's safety ensured with 15 minute and environmental checks. Pt currently denies SI/HI and A/V hallucinations. Pt states "I am too tired to think about it" this morning. Pt then reports that he has SI "almost all of the time."  Pt verbally agrees to seek staff if SI/HI or A/VH occurs and to consult with staff before acting on these thoughts. Pt to be discharged today per MD orders. Will continue POC.

## 2015-09-21 NOTE — [facility] Group Notes (Signed)
[facility] [name] Aftercare Discharge Planning Group Note   09/21/2015 9:25 AM  Participation Quality:  Invited. DID NOT ATTEND. Pt chose to remain in bed.   Smart, Cande Mastropietro [name]   
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
## 2015-09-21 NOTE — [facility] Suicide Risk Assessment (Signed)
[facility] Discharge Suicide Risk Assessment   Principal Problem: Bipolar I disorder, most recent episode depressed [facility]) Discharge Diagnoses:  Patient Active Problem List   Diagnosis Date Noted  . Bipolar affect, depressed (HCC) [F31.30] 09/18/2015  . Bipolar affective disorder, currently depressed, mild (HCC) [F31.31] 09/17/2015  . Intentional overdose of drug in tablet form (HCC) [T50.902A]   . Major depressive disorder, recurrent episode, moderate (HCC) [F33.1] 07/09/2015  . Depression [F32.9]   . Suicidal ideation [R45.851]   . Cannabis use disorder, moderate, dependence (HCC) [F12.20] 06/06/2015  . Alcohol use disorder, moderate, dependence (HCC) [F10.20] 06/06/2015  . Bipolar I disorder, most recent episode depressed (HCC) [F31.30] 05/23/2013  . Cannabis abuse [F12.10] 03/30/2012  . ADHD (attention deficit hyperactivity disorder), combined type [F90.2] 08/28/2011  . Conduct disorder, adolescent onset type [F91.2] 08/28/2011    Total Time spent with patient: 20 minutes  Musculoskeletal: Strength & Muscle Tone: within normal limits Gait & Station: normal Patient leans: normal  Psychiatric Specialty Exam: Review of Systems  Constitutional: Negative.   HENT: Negative.   Eyes: Negative.   Respiratory: Negative.   Cardiovascular: Negative.   Gastrointestinal: Negative.   Genitourinary: Negative.   Musculoskeletal: Negative.   Skin: Negative.   Neurological: Negative.   Endo/Heme/Allergies: Negative.   Psychiatric/Behavioral: Positive for depression.    Blood pressure 124/79, pulse 78, temperature 98.1 F (36.7 C), temperature source Oral, resp. rate 16, height  (1.778 m), weight 96.616 kg (213 lb), SpO2 98 %.Body mass index is 30.56 kg/(m^2).  General Appearance: Fairly Groomed  Patent attorney::  Fair  Speech:  Clear and Coherent409  Volume:  Decreased  Mood:  worried  Affect:  Appropriate  Thought Process:  Coherent and Goal Directed  Orientation:  Full (Time, Place,  and Person)  Thought Content:  plans as he moves on  Suicidal Thoughts:  No  Homicidal Thoughts:  No  Memory:  Immediate;   Fair Recent;   Fair Remote;   Fair  Judgement:  Fair  Insight:  Present and Shallow  Psychomotor Activity:  Normal  Concentration:  Fair  Recall:  [employer] of Knowledge:Fair  Language: Fair  Akathisia:  No  Handed:  Left  AIMS (if indicated):     Assets:  Desire for Improvement  Sleep:  Number of Hours: 6.75  Cognition: WNL  ADL's:  Intact  In full contact with reality. There are no active SI plans or intent. He is not going to be back at his mother's. He is planning to go to the [facility] and seek a bed at a shelter temporarily. His ACT team is going to take him there and continue to provide follow up Mental Status Per Nursing Assessment::   On Admission:     Demographic Factors:  Male and Adolescent or young adult  Loss Factors: NA  Historical Factors: NA  Risk Reduction Factors:   Positive therapeutic relationship  Continued Clinical Symptoms:  Bipolar Disorder:   Depressive phase  Cognitive Features That Contribute To Risk:  None    Suicide Risk:  Mild:  Suicidal ideation of limited frequency, intensity, duration, and specificity.  There are no identifiable plans, no associated intent, mild dysphoria and related symptoms, good self-control (both objective and subjective assessment), few other risk factors, and identifiable protective factors, including available and accessible social support.  Follow-up Information    Follow up with [facility].   Why:  ACT team will pick you up at 1:00PM and will transport you to the [facility]  for shelter placement.    Contact information:   [address] [place], [place] [address] Phone: [phone] Fax: [phone]      Plan Of Care/Follow-up recommendations:  Activity:  as tolerated Diet:  regular  Vanden Fawaz A, MD 09/21/2015, 11:50 AM

## 2016-03-08 ENCOUNTER — Encounter (HOSPITAL_COMMUNITY): Payer: Self-pay | Admitting: *Deleted

## 2016-03-08 ENCOUNTER — Encounter (HOSPITAL_COMMUNITY): Payer: Self-pay

## 2016-03-08 ENCOUNTER — Emergency Department (HOSPITAL_COMMUNITY)
Admission: EM | Admit: 2016-03-08 | Discharge: 2016-03-08 | Payer: Medicaid Other | Attending: Emergency Medicine | Admitting: Emergency Medicine

## 2016-03-08 ENCOUNTER — Observation Stay (HOSPITAL_COMMUNITY)
Admission: AD | Admit: 2016-03-08 | Discharge: 2016-03-10 | Disposition: A | Discharge status: Home / Self Care | Payer: Medicaid Other | Source: Intra-hospital | Attending: Psychiatry | Admitting: Psychiatry

## 2016-03-08 DIAGNOSIS — R45851 Suicidal ideations: Secondary | ICD-10-CM | POA: Diagnosis present

## 2016-03-08 DIAGNOSIS — F912 Conduct disorder, adolescent-onset type: Secondary | ICD-10-CM | POA: Insufficient documentation

## 2016-03-08 DIAGNOSIS — F17210 Nicotine dependence, cigarettes, uncomplicated: Secondary | ICD-10-CM | POA: Diagnosis not present

## 2016-03-08 DIAGNOSIS — Z79899 Other long term (current) drug therapy: Secondary | ICD-10-CM | POA: Insufficient documentation

## 2016-03-08 DIAGNOSIS — F1220 Cannabis dependence, uncomplicated: Secondary | ICD-10-CM | POA: Insufficient documentation

## 2016-03-08 DIAGNOSIS — F913 Oppositional defiant disorder: Secondary | ICD-10-CM | POA: Insufficient documentation

## 2016-03-08 DIAGNOSIS — F902 Attention-deficit hyperactivity disorder, combined type: Secondary | ICD-10-CM | POA: Insufficient documentation

## 2016-03-08 DIAGNOSIS — K219 Gastro-esophageal reflux disease without esophagitis: Secondary | ICD-10-CM | POA: Diagnosis not present

## 2016-03-08 DIAGNOSIS — Z818 Family history of other mental and behavioral disorders: Secondary | ICD-10-CM | POA: Insufficient documentation

## 2016-03-08 DIAGNOSIS — F3130 Bipolar disorder, current episode depressed, mild or moderate severity, unspecified: Secondary | ICD-10-CM | POA: Diagnosis present

## 2016-03-08 DIAGNOSIS — F1290 Cannabis use, unspecified, uncomplicated: Secondary | ICD-10-CM | POA: Diagnosis not present

## 2016-03-08 DIAGNOSIS — F319 Bipolar disorder, unspecified: Secondary | ICD-10-CM | POA: Insufficient documentation

## 2016-03-08 DIAGNOSIS — F3132 Bipolar disorder, current episode depressed, moderate: Principal | ICD-10-CM | POA: Insufficient documentation

## 2016-03-08 DIAGNOSIS — F1020 Alcohol dependence, uncomplicated: Secondary | ICD-10-CM | POA: Diagnosis not present

## 2016-03-08 DIAGNOSIS — F1510 Other stimulant abuse, uncomplicated: Secondary | ICD-10-CM | POA: Diagnosis present

## 2016-03-08 DIAGNOSIS — F909 Attention-deficit hyperactivity disorder, unspecified type: Secondary | ICD-10-CM | POA: Diagnosis not present

## 2016-03-08 LAB — COMPREHENSIVE METABOLIC PANEL
ALK PHOS: 67 U/L (ref 38–126)
ALT: 13 U/L — AB (ref 17–63)
AST: 14 U/L — AB (ref 15–41)
Albumin: 4.5 g/dL (ref 3.5–5.0)
Anion gap: 7 (ref 5–15)
BUN: 12 mg/dL (ref 6–20)
CHLORIDE: 105 mmol/L (ref 101–111)
CO2: 24 mmol/L (ref 22–32)
CREATININE: 0.86 mg/dL (ref 0.61–1.24)
Calcium: 9.2 mg/dL (ref 8.9–10.3)
GFR calc Af Amer: >60 mL/min (ref >60)
GFR calc non Af Amer: >60 mL/min (ref >60)
Glucose, Bld: 133 mg/dL — ABNORMAL HIGH (ref 65–99)
Potassium: 3.3 mmol/L — ABNORMAL LOW (ref 3.5–5.1)
SODIUM: 136 mmol/L (ref 135–145)
Total Bilirubin: 0.7 mg/dL (ref 0.3–1.2)
Total Protein: 7.8 g/dL (ref 6.5–8.1)

## 2016-03-08 LAB — CBC
HEMATOCRIT: 43 % (ref 39.0–52.0)
HEMOGLOBIN: 14.7 g/dL (ref 13.0–17.0)
MCH: 28.4 pg (ref 26.0–34.0)
MCHC: 34.2 g/dL (ref 30.0–36.0)
MCV: 83 fL (ref 78.0–100.0)
Platelets: 255 10*3/uL (ref 150–400)
RBC: 5.18 MIL/uL (ref 4.22–5.81)
RDW: 13.5 % (ref 11.5–15.5)
WBC: 11.8 10*3/uL — ABNORMAL HIGH (ref 4.0–10.5)

## 2016-03-08 LAB — RAPID URINE DRUG SCREEN, HOSP PERFORMED
AMPHETAMINES: POSITIVE — AB
BARBITURATES: NOT DETECTED
Benzodiazepines: POSITIVE — AB
Cocaine: NOT DETECTED
Opiates: NOT DETECTED
TETRAHYDROCANNABINOL: POSITIVE — AB

## 2016-03-08 LAB — SALICYLATE LEVEL: Salicylate Lvl: <4 mg/dL (ref 2.8–30.0)

## 2016-03-08 LAB — ETHANOL: Alcohol, Ethyl (B): <5 mg/dL (ref <5)

## 2016-03-08 LAB — ACETAMINOPHEN LEVEL: Acetaminophen (Tylenol), Serum: <10 ug/mL — ABNORMAL LOW (ref 10–30)

## 2016-03-08 MED ORDER — ZOLPIDEM TARTRATE 5 MG PO TABS
10.0000 mg | ORAL_TABLET | Freq: Every evening | ORAL | Status: DC | PRN
  Administered 2016-03-08 – 2016-03-09 (×2): 10 mg via ORAL
  Filled 2016-03-08 (×2): qty 2

## 2016-03-08 MED ORDER — ZOLPIDEM TARTRATE 10 MG PO TABS: 10.0000 mg | ORAL_TABLET | Freq: Every evening | ORAL | Status: DC | PRN

## 2016-03-08 MED ORDER — ASENAPINE MALEATE 5 MG SL SUBL
5.0000 mg | SUBLINGUAL_TABLET | Freq: Two times a day (BID) | SUBLINGUAL | Status: DC
  Filled 2016-03-08: qty 1

## 2016-03-08 MED ORDER — PANTOPRAZOLE SODIUM 40 MG PO TBEC
40.0000 mg | DELAYED_RELEASE_TABLET | Freq: Every day | ORAL | Status: DC
  Administered 2016-03-09 – 2016-03-10 (×2): 40 mg via ORAL
  Filled 2016-03-08 (×2): qty 1

## 2016-03-08 MED ORDER — PANTOPRAZOLE SODIUM 40 MG PO TBEC: 40.0000 mg | DELAYED_RELEASE_TABLET | Freq: Every day | ORAL | Status: DC

## 2016-03-08 MED ORDER — NICOTINE 14 MG/24HR TD PT24
14.0000 mg | MEDICATED_PATCH | Freq: Every day | TRANSDERMAL | Status: DC
  Administered 2016-03-08: 14 mg via TRANSDERMAL
  Filled 2016-03-08 (×3): qty 1

## 2016-03-08 MED ORDER — MAGNESIUM HYDROXIDE 400 MG/5ML PO SUSP: 30.0000 mL | Freq: Every day | ORAL | Status: DC | PRN

## 2016-03-08 MED ORDER — CITALOPRAM HYDROBROMIDE 20 MG PO TABS
30.0000 mg | ORAL_TABLET | Freq: Every day | ORAL | Status: DC
  Administered 2016-03-09 – 2016-03-10 (×2): 30 mg via ORAL
  Filled 2016-03-08 (×2): qty 1

## 2016-03-08 MED ORDER — ALBUTEROL SULFATE (2.5 MG/3ML) 0.083% IN NEBU: 2.5000 mg | INHALATION_SOLUTION | RESPIRATORY_TRACT | Status: DC | PRN

## 2016-03-08 MED ORDER — CITALOPRAM HYDROBROMIDE 10 MG PO TABS: 30.0000 mg | ORAL_TABLET | Freq: Every day | ORAL | Status: DC

## 2016-03-08 MED ORDER — NICOTINE POLACRILEX 2 MG MT GUM: 2.0000 mg | CHEWING_GUM | OROMUCOSAL | Status: DC | PRN

## 2016-03-08 MED ORDER — ALUM & MAG HYDROXIDE-SIMETH 200-200-20 MG/5ML PO SUSP: 30.0000 mL | ORAL | Status: DC | PRN

## 2016-03-08 MED ORDER — ALBUTEROL SULFATE HFA 108 (90 BASE) MCG/ACT IN AERS: 2.0000 | INHALATION_SPRAY | RESPIRATORY_TRACT | Status: DC | PRN

## 2016-03-08 MED ORDER — DIVALPROEX SODIUM ER 500 MG PO TB24: 1500.0000 mg | ORAL_TABLET | Freq: Every day | ORAL | Status: DC

## 2016-03-08 MED ORDER — HYDROXYZINE HCL 50 MG PO TABS
50.0000 mg | ORAL_TABLET | Freq: Once | ORAL | Status: AC
  Administered 2016-03-09: 50 mg via ORAL
  Filled 2016-03-08: qty 1

## 2016-03-08 MED ORDER — ASENAPINE MALEATE 5 MG SL SUBL
5.0000 mg | SUBLINGUAL_TABLET | Freq: Two times a day (BID) | SUBLINGUAL | Status: DC
  Administered 2016-03-09 – 2016-03-10 (×3): 5 mg via SUBLINGUAL
  Filled 2016-03-08 (×7): qty 1

## 2016-03-08 MED ORDER — DIVALPROEX SODIUM ER 500 MG PO TB24
1500.0000 mg | ORAL_TABLET | Freq: Every day | ORAL | Status: DC
  Administered 2016-03-08 – 2016-03-09 (×2): 1500 mg via ORAL
  Filled 2016-03-08 (×2): qty 3

## 2016-03-08 MED ORDER — ACETAMINOPHEN 325 MG PO TABS
650.0000 mg | ORAL_TABLET | Freq: Four times a day (QID) | ORAL | Status: DC | PRN
  Administered 2016-03-08 – 2016-03-09 (×2): 650 mg via ORAL
  Filled 2016-03-08 (×2): qty 2

## 2016-03-08 MED ORDER — POTASSIUM CHLORIDE CRYS ER 20 MEQ PO TBCR
40.0000 meq | EXTENDED_RELEASE_TABLET | Freq: Once | ORAL | Status: AC
  Administered 2016-03-08: 40 meq via ORAL
  Filled 2016-03-08: qty 2

## 2016-03-08 NOTE — B[facility] Counselor (Signed)
This Clinical research associatewriter received a phone call from Courd, a member of this pt's ACTT team. This Clinical research associatewriter provided Courd with a summary for pt's current admission to OBS. ACTT team member stated that the team will meet in the morning and come up with a plan for this patient by noon. ACTT team member also reports that they will be responsible for picking the above mentioned pt up on tomorrow. ACTT team member reports that someone from the team will call after the morning meeting to conduct a follow-up.  Ardelle ParkLatoya McNeil, MA OBS Counselor

## 2016-03-08 NOTE — ED Notes (Signed)
Pt discharged ambulatory with [place] driver.  Pt was in no distress.  All belongings sent with patient.

## 2016-03-08 NOTE — BH Assessment (Addendum)
Tele Assessment Note   Ryan Bartlett is an 20 y.o. male ....who presents voluntarily reporting symptoms of meth use, depression and suicidal ideation. Pt states he has a history of schizoaffective and has treatment from [facility] team, but he says he has not had contact with them recently until he called them yesterday. He states he has been off of his medications for a while. Pt reports current suicidal ideation with plans of overdosing or hanging himself . Past attempts include "101 times since I was 20 yo". Pt acknowledges symptoms including: depression, lack of sleep, "seeing lights/circles when is is dark and I am high". PT denies homicidal ideation/ history of violence. Pt denies auditory or visual hallucinations or other psychotic symptoms. Pt states current stressors include living in a hotel, supports include his mom, but he says she does not know about him using meth . Pt denies history of abuse and trauma include. Pt reports there is a family history of alcoholism (dad). Pt denies work history includes. Pt has poor insight and  judgment. Pt's memory is normal .Legal history includes being on probation for felony larceny. ? Pt's OP history includes [facility] team. [facility]. Pt does not remeber last admission. Pt admits to daily meth use for the past 3 months. . ? MSE: Pt is casually dressed, alert, oriented x4 with normal speech and normal motor behavior. Eye contact is good. Pt's mood is appropriate to circumstance and affect is congruent with mood. Thought process is coherent and relevant. There is no indication Pt is currently responding to internal stimuli or experiencing delusional thought content. Pt was cooperative throughout assessment. Pt is currently unable to contract for safety outside the hospital and wants inpatient psychiatric treatment.  Pt recommended for observation unit by Nanine MeansJamison  Lord, DNP. OBS 5 [facility] [place]  Diagnosis: Substance Abuse Disorder, Bipolar Disorder  Past Medical History:  Past Medical History  Diagnosis Date  . ADHD (attention deficit hyperactivity disorder)   . Unspecified episodic mood disorder   . Oppositional defiant disorder   . Bipolar disorder (HCC)   . Depression     Past Surgical History  Procedure Laterality Date  . No past surgeries      Family History:  Family History  Problem Relation Age of Onset  . Depression Mother   . Diabetes Other   . Hyperlipidemia Other   . Hypertension Other     Social History:  reports that he has been smoking Cigarettes.  He has been smoking about 0.50 packs per day for the past 0 years. He has never used smokeless tobacco. He reports that he drinks about 3.6 oz of alcohol per week. He reports that he uses illicit drugs (Marijuana).  Additional Social History:  Alcohol / Drug Use Pain Medications: denies Prescriptions: denies Over the Counter: denies History of alcohol / drug use?: Yes Longest period of sobriety (when/how long): 1 1/2 days Negative Consequences of Use: Financial, Legal Withdrawal Symptoms:  (itchy) Substance #1 Name of Substance 1: meth 1 - Age of First Use: 19 1 - Amount (size/oz): 2 grams/day 1 - Frequency: daily 1 - Duration: 3 mo 1 - Last Use / Amount: Tuesday  CIWA: CIWA-Ar BP: 110/70 mmHg Pulse Rate: (!) 53 COWS:    PATIENT STRENGTHS: (choose at least two) Average or above average intelligence Capable of independent living Communication skills Supportive family/friends  Allergies:  Allergies  Allergen Reactions  .  Peanut-Containing Drug Products Anaphylaxis  . Lactose Intolerance (Gi) Diarrhea and Nausea And Vomiting    Home Medications:  (Not in a hospital admission)  OB/GYN Status:  No LMP for male patient.  General Assessment Data [facility] of Assessment: [facility] TTS Assessment: In system Is this a Tele or Face-to-Face Assessment?: Tele  Assessment Is this an Initial Assessment or a Re-assessment for this encounter?: Initial Assessment Marital status: Single Living Arrangements:  (hotel) Can pt return to current living arrangement?: Yes Admission Status: Voluntary Is patient capable of signing voluntary admission?: Yes Referral Source: Self/Family/Friend Insurance type: MCD     Crisis Care Plan Living Arrangements:  (hotel) Name of Psychiatrist: [facility] Name of Therapist: [facility]  Education Status Is patient currently in school?: No  Risk to self with the past 6 months Suicidal Ideation: Yes-Currently Present Has patient been a risk to self within the past 6 months prior to admission? : No Suicidal Intent: Yes-Currently Present Has patient had any suicidal intent within the past 6 months prior to admission? : No Is patient at risk for suicide?: Yes Suicidal Plan?: Yes-Currently Present Has patient had any suicidal plan within the past 6 months prior to admission? : Yes Specify Current Suicidal Plan:  (hanging or overdose) Access to Means: Yes Specify Access to Suicidal Means:  (drugs, rope) What has been your use of drugs/alcohol within the last 12 months?:  (see Sa section) Previous Attempts/Gestures: Yes How many times?:  ("101" since I was 12) Other Self Harm Risks:  (none) Triggers for Past Attempts: Unpredictable Intentional Self Injurious Behavior: None Family Suicide History: No Recent stressful life event(s): Financial Problems (living in hotel) Persecutory voices/beliefs?: No Depression: Yes Depression Symptoms: Isolating, Loss of interest in usual pleasures, Feeling angry/irritable Substance abuse history and/or treatment for substance abuse?: Yes Suicide prevention information given to non-admitted patients: Not applicable  Risk to Others within the past 6 months Homicidal Ideation: No Does patient have any lifetime risk of violence toward others beyond the six  months prior to admission? : No Thoughts of Harm to Others: No Current Homicidal Intent: No Current Homicidal Plan: No History of harm to others?: No Assessment of Violence: None Noted Does patient have access to weapons?: No Criminal Charges Pending?: No Does patient have a court date: No Is patient on probation?: Yes (felony larceny)  Psychosis Hallucinations: Visual (sees lights at night) Delusions: None noted  Mental Status Report Appearance/Hygiene: Disheveled Eye Contact: Good Motor Activity: Restlessness Speech: Logical/coherent Level of Consciousness: Drowsy Mood: Apathetic Affect: Appropriate to circumstance Anxiety Level: Moderate Thought Processes: Coherent, Relevant Judgement: Partial Orientation: Person, Place, Time, Situation, Appropriate for developmental age Obsessive Compulsive Thoughts/Behaviors: None  Cognitive Functioning Concentration: Fair Memory: Recent Intact, Remote Intact IQ: Average Insight: Poor Impulse Control: Fair Appetite: Fair Weight Loss: 0 Weight Gain: 0 Sleep: Decreased Total Hours of Sleep: 5 Vegetative Symptoms: None  ADLScreening [facility] Assessment Services) Patient's cognitive ability adequate to safely complete daily activities?: Yes Patient able to express need for assistance with ADLs?: Yes Independently performs ADLs?: Yes (appropriate for developmental age)  Prior Inpatient Therapy Prior Inpatient Therapy: Yes Prior Therapy Dates:  (doesn't remember ) Prior Therapy Facilty/Provider(s): [facility], [facility], [facility], [facility] Reason for Treatment: Si  Prior Outpatient Therapy Prior Outpatient Therapy: Yes Prior Therapy Facilty/Provider(s): [facility] Reason for Treatment:  (depression, Sa) Does patient have an ACCT team?: Yes Does patient have Intensive In-House Services?  : No Does patient have [facility] services? : No Does patient have P[facility] services?:  No  ADL Screening (condition at time of admission) Patient's  cognitive ability adequate to safely complete daily activities?: Yes Is the patient deaf or have difficulty hearing?: No Does the patient have difficulty seeing, even when wearing glasses/contacts?: No Does the patient have difficulty concentrating, remembering, or making decisions?: No Patient able to express need for assistance with ADLs?: Yes Does the patient have difficulty dressing or bathing?: No Independently performs ADLs?: Yes (appropriate for developmental age) Does the patient have difficulty walking or climbing stairs?: No Weakness of Legs: None Weakness of Arms/Hands: None  Home Assistive Devices/Equipment Home Assistive Devices/Equipment: None    Abuse/Neglect Assessment (Assessment to be complete while patient is alone) Physical Abuse: Denies Verbal Abuse: Denies Sexual Abuse: Denies Exploitation of patient/patient's resources: Denies Self-Neglect: Denies Values / Beliefs Cultural Requests During Hospitalization: None Spiritual Requests During Hospitalization: None   Advance Directives (For Healthcare) Does patient have an advance directive?: No Would patient like information on creating an advanced directive?: No - patient declined information    Additional Information 1:1 In Past 12 Months?: No CIRT Risk: No Elopement Risk: No Does patient have medical clearance?: Yes     Disposition:  Disposition Initial Assessment Completed for this Encounter: Yes Disposition of Patient: Inpatient treatment program (Obs) Type of inpatient treatment program: Adult (OBS)  Robynne Roat Hines 03/08/2016 2:39 PM

## 2016-03-08 NOTE — ED Notes (Signed)
Bed: [facility]34 Expected date:  Expected time:  Means of arrival:  Comments: Hall C 

## 2016-03-08 NOTE — ED Notes (Signed)
Patient still asleep.  Sitter at bedside.

## 2016-03-08 NOTE — Progress Notes (Signed)
Admission Note: [facility] 20 [facility]  Pt awake, alert and cooperative on arrival.  He reports that he had thoughts of suicide by hanging himself or overdosing.  He says that he tried to kill himself approximately 4 years ago.  He verbally contracts for safety.  He denies homicidal ideation.  He reports that he uses methamphetamine and marijuana and says that when he uses methamphetamine he sees white sirens.  He reports feeling depressed.  He says he lives in a hotel with his parents but that they are not very supportive.  He says his only emotional support is from a friend.  Patient and belongings checked for contraband with none found.  Belongings locked up by Security.  Pt placed in scrubs.  Skin assessment done ([facility]); pt has tattoos on right arm but no other skin abnormalities noted.  He denies pain and states that his only medical problem is asthma.  He says that he does not have a regular doctor but is supposed to be taking medications but does not take them; emotional support given; encouraged him to seek assistance with needs/concerns.

## 2016-03-08 NOTE — ED Provider Notes (Signed)
CSN: [id number]     Arrival date & time 03/08/16  0123 History   First MD Initiated Contact with Patient 03/08/16 0310     Chief Complaint  Patient presents with  . Suicidal   HPI  Mr. Tabbert is a 20 year old male with past medical history of bipolar disorder presenting with worsening depression and suicidal thoughts. Patient states he has felt worsening depression over the past month. He denies specific events that may be influencing his change in mood. He has been having suicidal thoughts over the past week with a plan to either overdose or hang himself. Denies firearms in the home. Denies previous suicide attempts. Denies self harming behaviors. He is also complaining of pruritus on his chin. He states that he smoked an unknown drug 3 days ago and has been having itching since. He has not taken any over-the-counter medications for this. Denies fevers, chills, sensation of throat closing, difficulty breathing, wheezing, nausea or vomiting. Pt has no other physical complaints today.   Past Medical History  Diagnosis Date  . ADHD (attention deficit hyperactivity disorder)   . Unspecified episodic mood disorder   . Oppositional defiant disorder   . Bipolar disorder (HCC)   . Depression    Past Surgical History  Procedure Laterality Date  . No past surgeries     Family History  Problem Relation Age of Onset  . Depression Mother   . Diabetes Other   . Hyperlipidemia Other   . Hypertension Other    Social History  Substance Use Topics  . Smoking status: Current Every Day Smoker -- 0.50 packs/day for 0 years    Types: Cigarettes  . Smokeless tobacco: Never Used  . Alcohol Use: 3.6 oz/week    6 Cans of beer per week     Comment: "I actually recently quit"    Review of Systems  All other systems reviewed and are negative.     Allergies  Peanut-containing drug products and Lactose intolerance (gi)  Home Medications   Prior to Admission medications   Medication Sig Start  Date End Date Taking? Authorizing Provider  albuterol (PROVENTIL HFA;VENTOLIN HFA) 108 (90 Base) MCG/ACT inhaler Inhale 2 puffs into the lungs every 4 (four) hours as needed for wheezing or shortness of breath. 09/21/15  Yes Sanjuana Kava, NP  asenapine (SAPHRIS) 5 MG SUBL 24 hr tablet Place 5 mg under the tongue 2 (two) times daily.   Yes Historical Provider, MD  QUEtiapine (SEROQUEL) 100 MG tablet Take 100 mg by mouth at bedtime.   Yes Historical Provider, MD  citalopram (CELEXA) 10 MG tablet Take 3 tablets (30 mg total) by mouth daily. For depression Patient not taking: Reported on 03/08/2016 09/21/15   Sanjuana Kava, NP  divalproex (DEPAKOTE ER) 500 MG 24 hr tablet Take 3 tablets (1,500 mg total) by mouth at bedtime. For mood stabilization Patient not taking: Reported on 03/08/2016 09/21/15   Sanjuana Kava, NP  nicotine polacrilex (NICORETTE) 2 MG gum Take 1 each (2 mg total) by mouth as needed for smoking cessation. Patient not taking: Reported on 03/08/2016 09/21/15   Sanjuana Kava, NP  omeprazole (PRILOSEC) 20 MG capsule Take 1 capsule (20 mg total) by mouth daily. For acid reflux Patient not taking: Reported on 03/08/2016 09/21/15   Sanjuana Kava, NP  zolpidem (AMBIEN) 10 MG tablet Take 1 tablet (10 mg total) by mouth at bedtime as needed for sleep. 09/21/15 10/21/15  Sanjuana Kava, NP   BP 142/76  mmHg  Pulse 68  Temp(Src) 98.2 F (36.8 C) (Oral)  Resp 18  SpO2 100% Physical Exam  Constitutional: He appears well-developed and well-nourished. No distress.  HENT:  Head: Normocephalic and atraumatic.  Multiple small pustular papules noted to patient's chin at hair follicles. No surrounding erythema or tenderness.  Eyes: Conjunctivae are normal. Right eye exhibits no discharge. Left eye exhibits no discharge. No scleral icterus.  Neck: Normal range of motion.  Cardiovascular: Normal rate, regular rhythm and normal heart sounds.   Pulmonary/Chest: Effort normal and breath sounds normal. No respiratory  distress.  Abdominal: Soft. He exhibits no distension. There is no tenderness.  Musculoskeletal: Normal range of motion.  Neurological: He is alert. Coordination normal.  Skin: Skin is warm and dry.  Psychiatric: He has a normal mood and affect. His speech is normal. He is withdrawn. He expresses suicidal ideation. He expresses no homicidal ideation. He expresses suicidal plans. He expresses no homicidal plans.  Nursing note and vitals reviewed.   ED Course  Procedures (including critical care time) Labs Review Labs Reviewed  COMPREHENSIVE METABOLIC PANEL - Abnormal; Notable for the following:    Potassium 3.3 (*)    Glucose, Bld 133 (*)    AST 14 (*)    ALT 13 (*)    All other components within normal limits  ACETAMINOPHEN LEVEL - Abnormal; Notable for the following:    Acetaminophen (Tylenol), Serum <10 (*)    All other components within normal limits  CBC - Abnormal; Notable for the following:    WBC 11.8 (*)    All other components within normal limits  URINE RAPID DRUG SCREEN, HOSP PERFORMED - Abnormal; Notable for the following:    Benzodiazepines POSITIVE (*)    Amphetamines POSITIVE (*)    Tetrahydrocannabinol POSITIVE (*)    All other components within normal limits  ETHANOL  SALICYLATE LEVEL    Imaging Review No results found. I have personally reviewed and evaluated these images and lab results as part of my medical decision-making.   EKG Interpretation None      MDM   Final diagnoses:  Suicidal ideation   Patient presenting with worsening depression and SI with a plan. Also complains of pruritus on his chin. Denies all other complaints at this time. VSS and pt is non-toxic appearing. Likely folliculitis at chin. Otherwise benign physical exam and blood work is unremarkable. Kdur given. Patient has been medically cleared in the ED and is appropriate for TTS consultation and their recommendations. Pt is in NAD and stable [facility].      Alveta HeimlichStevi  Makyra Corprew, PA-C 03/08/16 0555  Laurence Spatesachel Morgan Little, MD 03/10/16 [phone]

## 2016-03-08 NOTE — ED Notes (Signed)
Pt admitted to room and unit.  Pt c/o itching related to withdrawals.  He also c/o suicidal thoughts.  Pt is quiet and withdrawn.  15 minute checks and video monitoring in place.

## 2016-03-08 NOTE — BH Assessment (Signed)
[facility] Assessment Progress Note  Per Nanine MeansJamison Lord, DNP, this pt [facility].  Lillia AbedLindsay, RN, [facility] has assigned pt to Obs 5.  Pt has signed Voluntary Admission and Consent for Treatment, as well as Consent to Release Information to no one, and signed forms have been [facility].  Pt's nurse, Kendal Hymendie, has been notified, and agrees to send original paperwork along with pt via [facility], and to call report to [phone] or [phone].  Doylene Canninghomas Delanee Xin, MA Triage Specialist [phone]

## 2016-03-08 NOTE — ED Notes (Signed)
Patient ate 100% of breakfast. °

## 2016-03-08 NOTE — ED Notes (Signed)
Pt states that he has been feeling suicidal for approx 10 days; pt states that he has a plan to overdose on somebody else's medication; pt states that he has been using over the last few days; pt states that he used an unknown drug 3 days ago that is making him itch; pt admits to marijuana and states "I don't know what all I have used"

## 2016-03-09 DIAGNOSIS — F3132 Bipolar disorder, current episode depressed, moderate: Secondary | ICD-10-CM | POA: Diagnosis not present

## 2016-03-09 DIAGNOSIS — F3130 Bipolar disorder, current episode depressed, mild or moderate severity, unspecified: Secondary | ICD-10-CM

## 2016-03-09 NOTE — Progress Notes (Signed)
D: Patient complained of headache of 7/10. Accepted acetaminophen 650 mg PRN for pain. Denies active SI, AH/VH.  Patient compliant with medications and treatment regimen. No behavioral issues noted.  A: Staff offered support and encouragement as needed. Safety maintained by constant observation except when patient is in bathroom. Patient sleeping at this time. Will continue to monitor.  R: Patient remains safe

## 2016-03-09 NOTE — Progress Notes (Signed)
D:  Patient calm and cooperative this am.  Affect flat, mood depressed.  He states he is still having suicidal thoughts of hanging himself but verbally contracts for safety.  He denies homicidal ideation.  No AVH reported.  No self-injurious behaviors noted or reported. A:  Scheduled medications given as ordered.  Emotional support provided. Encouraged patient to seek assistance for needs/concerns. R:  Safety maintained on unit.

## 2016-03-09 NOTE — H&P (Signed)
[facility] Provider Admission PAA/H&P  Patient Identification: Ryan Bartlett MRN:  [id number] Date of Evaluation:  03/09/2016 Chief Complaint:  MOOD DISORDER SUBSTANCE ABUSE DISORDER Principal Diagnosis: Bipolar affective disorder, current episode depressed ([place]) Diagnosis:   Patient Active Problem List   Diagnosis Date Noted  . 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 Bipolar affective disorder, current episode depressed ([place]) [F31.30] 03/08/2016  . Bipolar affect, depressed ([place]) [F31.30] 09/18/2015  . Bipolar affective disorder, currently depressed, mild ([place]) [F31.31] 09/17/2015  . Intentional overdose of drug in tablet form ([place]) [T50.902A]   . Major depressive disorder, recurrent episode, moderate ([place]) [F33.1] 07/09/2015  . Depression [F32.9]   . Suicidal ideation [R45.851]   . Cannabis use disorder, moderate, dependence ([place]) [F12.20] 06/06/2015  . Alcohol use disorder, moderate, dependence ([place]) [F10.20] 06/06/2015  . Bipolar I disorder, most recent episode depressed ([place]) [F31.30] 05/23/2013  . Cannabis abuse [F12.10] 03/30/2012  . ADHD (attention deficit hyperactivity disorder), combined type [F90.2] 08/28/2011  . Conduct disorder, adolescent onset type [F91.2] 08/28/2011   History of Present Illness: Ryan Bartlett is an 20 y.o. male ....who presents voluntarily reporting symptoms of meth use, depression and suicidal ideation. Pt states he has a history of schizoaffective and has treatment from [facility] team, but he says he has not had contact with them recently until he called them yesterday. He states he has been off of his medications for a while. Pt reports current suicidal ideation with plans of overdosing or hanging himself . Past attempts include "101 times since I was 20 yo". Pt acknowledges symptoms including: depression, lack of sleep, "seeing lights/circles when is is dark and I am high". PT denies homicidal ideation/ history of  violence. Pt denies auditory or visual hallucinations or other psychotic symptoms. Pt states current stressors include living in a hotel, supports include his mom, but he says she does not know about him using meth . Pt denies history of abuse and trauma include. Pt reports there is a family history of alcoholism (dad). Pt denies work history includes. Pt has poor insight and judgment. Pt's memory is normal .Legal history includes being on probation for felony larceny. ? Pt's OP history includes [facility] team. IP history includes mult admissions at [facility], [facility], [facility] and [facility]. Pt does not remeber last admission. Pt admits to daily meth use for the past 3 months. .   On Evaluation: Ryan Bartlett is awake, alert and oriented X4. Seen resting on the observation unit. Patient endorsing suicidal ideation. Patient denies homicidal ideation. Denies auditory or visual hallucination and does not appear to be responding to internal stimuli. Patient validates the information that was provided above. Support, encouragement and reassurance was provided.   Associated Signs/Symptoms: Depression Symptoms:  depressed mood, (Hypo) Manic Symptoms:  Impulsivity, Anxiety Symptoms:  Social Anxiety, Psychotic Symptoms:  Hallucinations: None PTSD Symptoms: Avoidance:  None Total Time spent with patient: 30 minutes  Past Psychiatric History: See Above  Is the patient at risk to self? No.  Has the patient been a risk to self in the past 6 months? No.  Has the patient been a risk to self within the distant past? No.  Is the patient a risk to others? No.  Has the patient been a risk to others in the past 6 months? No.  Has the patient been a risk to others within the distant past? No.   Prior Inpatient Therapy:   Prior Outpatient Therapy:    Alcohol Screening:  1. How often do you have a drink containing alcohol?: 2 to 3 times a week 2. How many drinks containing alcohol do you have on a  typical day when you are drinking?: 1 or 2 3. How often do you have six or more drinks on one occasion?: Never Preliminary Score: 0 9. Have you or someone else been injured as a result of your drinking?: No 10. Has a relative or friend or a doctor or another health worker been concerned about your drinking or suggested you cut down?: No Alcohol Use Disorder Identification Test Final Score (AUDIT): 3 Brief Intervention: AUDIT score less than 7 or less-screening does not suggest unhealthy drinking-brief intervention not indicated Substance Abuse History in the last 12 months:  Yes.   Consequences of Substance Abuse: Withdrawal Symptoms:   Headaches Previous Psychotropic Medications: Yes  Psychological Evaluations: YES Past Medical History:  Past Medical History  Diagnosis Date  . ADHD (attention deficit hyperactivity disorder)   . Unspecified episodic mood disorder   . Oppositional defiant disorder   . Bipolar disorder ([place])   . Depression     Past Surgical History  Procedure Laterality Date  . No past surgeries     Family History:  Family History  Problem Relation Age of Onset  . Depression Mother   . Diabetes Other   . Hyperlipidemia Other   . Hypertension Other    Family Psychiatric History: father; EtoH abuse Tobacco Screening: '@FLOW'$ ([phone])::1)@ Social History:  History  Alcohol Use  . 3.6 oz/week  . 6 Cans of beer per week    Comment: "I actually recently quit"     History  Drug Use  . Yes  . Special: Marijuana    Additional Social History:                           Allergies:   Allergies  Allergen Reactions  . Peanut-Containing Drug Products Anaphylaxis  . Lactose Intolerance (Gi) Diarrhea and Nausea And Vomiting   Lab Results:  Results for orders placed or performed during the hospital encounter of 03/08/16 (from the past 48 hour(s))  Comprehensive metabolic panel     Status: Abnormal   Collection Time: 03/08/16  2:56 AM  Result Value  Ref Range   Sodium 136 135 - 145 mmol/L   Potassium 3.3 (L) 3.5 - 5.1 mmol/L   Chloride 105 101 - 111 mmol/L   CO2 24 22 - 32 mmol/L   Glucose, Bld 133 (H) 65 - 99 mg/dL   BUN 12 6 - 20 mg/dL   Creatinine, Ser 0.86 0.61 - 1.24 mg/dL   Calcium 9.2 8.9 - 10.3 mg/dL   Total Protein 7.8 6.5 - 8.1 g/dL   Albumin 4.5 3.5 - 5.0 g/dL   AST 14 (L) 15 - 41 U/L   ALT 13 (L) 17 - 63 U/L   Alkaline Phosphatase 67 38 - 126 U/L   Total Bilirubin 0.7 0.3 - 1.2 mg/dL   GFR calc non Af Amer >60 >60 mL/min   GFR calc Af Amer >60 >60 mL/min    Comment: (NOTE) The eGFR has been calculated using the CKD EPI equation. This calculation has not been validated in all clinical situations. eGFR's persistently <60 mL/min signify possible Chronic Kidney Disease.    Anion gap 7 5 - 15  Ethanol     Status: None   Collection Time: 03/08/16  2:56 AM  Result Value Ref Range   Alcohol,  Ethyl (B) <5 <5 mg/dL    Comment:        LOWEST DETECTABLE LIMIT FOR SERUM ALCOHOL IS 5 mg/dL FOR MEDICAL PURPOSES ONLY   Salicylate level     Status: None   Collection Time: 03/08/16  2:56 AM  Result Value Ref Range   Salicylate Lvl <4.0 2.8 - 30.0 mg/dL  Acetaminophen level     Status: Abnormal   Collection Time: 03/08/16  2:56 AM  Result Value Ref Range   Acetaminophen (Tylenol), Serum <10 (L) 10 - 30 ug/mL    Comment:        THERAPEUTIC CONCENTRATIONS VARY SIGNIFICANTLY. A RANGE OF 10-30 ug/mL MAY BE AN EFFECTIVE CONCENTRATION FOR MANY PATIENTS. HOWEVER, SOME ARE BEST TREATED AT CONCENTRATIONS OUTSIDE THIS RANGE. ACETAMINOPHEN CONCENTRATIONS >150 ug/mL AT 4 HOURS AFTER INGESTION AND >50 ug/mL AT 12 HOURS AFTER INGESTION ARE OFTEN ASSOCIATED WITH TOXIC REACTIONS.   cbc     Status: Abnormal   Collection Time: 03/08/16  2:56 AM  Result Value Ref Range   WBC 11.8 (H) 4.0 - 10.5 K/uL   RBC 5.18 4.22 - 5.81 MIL/uL   Hemoglobin 14.7 13.0 - 17.0 g/dL   HCT 33.9 53.1 - 75.2 %   MCV 83.0 78.0 - 100.0 fL   MCH  28.4 26.0 - 34.0 pg   MCHC 34.2 30.0 - 36.0 g/dL   RDW 47.4 57.9 - 63.7 %   Platelets 255 150 - 400 K/uL  Rapid urine drug screen (hospital performed)     Status: Abnormal   Collection Time: 03/08/16  3:19 AM  Result Value Ref Range   Opiates NONE DETECTED NONE DETECTED   Cocaine NONE DETECTED NONE DETECTED   Benzodiazepines POSITIVE (A) NONE DETECTED   Amphetamines POSITIVE (A) NONE DETECTED   Tetrahydrocannabinol POSITIVE (A) NONE DETECTED   Barbiturates NONE DETECTED NONE DETECTED    Comment:        DRUG SCREEN FOR MEDICAL PURPOSES ONLY.  IF CONFIRMATION IS NEEDED FOR ANY PURPOSE, NOTIFY LAB WITHIN 5 DAYS.        LOWEST DETECTABLE LIMITS FOR URINE DRUG SCREEN Drug Class       Cutoff (ng/mL) Amphetamine      1000 Barbiturate      200 Benzodiazepine   200 Tricyclics       300 Opiates          300 Cocaine          300 THC              50     Blood Alcohol level:  Lab Results  Component Value Date   ETH <5 03/08/2016   ETH <5 09/16/2015    Metabolic Disorder Labs:  Lab Results  Component Value Date   HGBA1C 5.8* 06/02/2015   MPG 120 06/02/2015   MPG 126* 05/14/2012   No results found for: PROLACTIN Lab Results  Component Value Date   CHOL 199 06/02/2015   TRIG 390* 06/02/2015   HDL 31* 06/02/2015   CHOLHDL 6.4 06/02/2015   VLDL 78* 06/02/2015   LDLCALC 90 06/02/2015   LDLCALC 118* 05/14/2012    Current Medications: Current Facility-Administered Medications  Medication Dose Route Frequency Provider Last Rate Last Dose  . acetaminophen (TYLENOL) tablet 650 mg  650 mg Oral Q6H PRN Charm Rings, NP   650 mg at 03/08/16 1847  . albuterol (PROVENTIL HFA;VENTOLIN HFA) 108 (90 Base) MCG/ACT inhaler 2 puff  2 puff Inhalation Q4H PRN Charm Rings,  NP      . alum & mag hydroxide-simeth (MAALOX/MYLANTA) 200-200-20 MG/5ML suspension 30 mL  30 mL Oral Q4H PRN Patrecia Pour, NP      . asenapine (SAPHRIS) sublingual tablet 5 mg  5 mg Sublingual BID Patrecia Pour,  NP   5 mg at 03/09/16 0854  . citalopram (CELEXA) tablet 30 mg  30 mg Oral Daily Patrecia Pour, NP   30 mg at 03/09/16 0854  . divalproex (DEPAKOTE ER) 24 hr tablet 1,500 mg  1,500 mg Oral QHS Patrecia Pour, NP   1,500 mg at 03/08/16 2122  . magnesium hydroxide (MILK OF MAGNESIA) suspension 30 mL  30 mL Oral Daily PRN Patrecia Pour, NP      . nicotine (NICODERM CQ - dosed in mg/24 hours) patch 14 mg  14 mg Transdermal Daily Benjamine Mola, FNP   14 mg at 03/08/16 1842  . nicotine polacrilex (NICORETTE) gum 2 mg  2 mg Oral PRN Patrecia Pour, NP      . pantoprazole (PROTONIX) EC tablet 40 mg  40 mg Oral Daily Patrecia Pour, NP   40 mg at 03/09/16 0854  . zolpidem (AMBIEN) tablet 10 mg  10 mg Oral QHS PRN Patrecia Pour, NP   10 mg at 03/08/16 2142   PTA Medications: Prescriptions prior to admission  Medication Sig Dispense Refill Last Dose  . albuterol (PROVENTIL HFA;VENTOLIN HFA) 108 (90 Base) MCG/ACT inhaler Inhale 2 puffs into the lungs every 4 (four) hours as needed for wheezing or shortness of breath.   Past Month at Unknown time  . asenapine (SAPHRIS) 5 MG SUBL 24 hr tablet Place 5 mg under the tongue 2 (two) times daily.   Past Week at Unknown time  . citalopram (CELEXA) 10 MG tablet Take 3 tablets (30 mg total) by mouth daily. For depression (Patient not taking: Reported on 03/08/2016) 90 tablet 0   . divalproex (DEPAKOTE ER) 500 MG 24 hr tablet Take 3 tablets (1,500 mg total) by mouth at bedtime. For mood stabilization (Patient not taking: Reported on 03/08/2016) 90 tablet 0   . nicotine polacrilex (NICORETTE) 2 MG gum Take 1 each (2 mg total) by mouth as needed for smoking cessation. (Patient not taking: Reported on 03/08/2016) 100 tablet 0   . omeprazole (PRILOSEC) 20 MG capsule Take 1 capsule (20 mg total) by mouth daily. For acid reflux (Patient not taking: Reported on 03/08/2016) 7 capsule 0   . zolpidem (AMBIEN) 10 MG tablet Take 1 tablet (10 mg total) by mouth at bedtime as needed  for sleep. 15 tablet 0     Musculoskeletal: Strength & Muscle Tone: within normal limits Gait & Station: normal Patient leans: N/A  Psychiatric Specialty Exam: Physical Exam  Nursing note and vitals reviewed. Constitutional: He is oriented to person, place, and time. He appears well-developed.  Neurological: He is alert and oriented to person, place, and time.  Psychiatric: He has a normal mood and affect. His behavior is normal.    Review of Systems  Psychiatric/Behavioral: Positive for depression, suicidal ideas and substance abuse. Negative for hallucinations. The patient has insomnia.   All other systems reviewed and are negative.   Blood pressure 110/69, pulse 72, temperature 97.8 F (36.6 C), temperature source Oral, resp. rate 16, height 5' 9.5" (1.765 m), weight 89.359 kg (197 lb), SpO2 100 %.Body mass index is 28.68 kg/(m^2).  General Appearance: Casual  Eye Contact:  Good  Speech:  Clear  and Coherent  Volume:  Normal  Mood:  Euthymic  Affect:  Congruent  Thought Process:  Linear  Orientation:  Full (Time, Place, and Person)  Thought Content:  Hallucinations: None  Suicidal Thoughts:  No  Homicidal Thoughts:  No  Memory:  Immediate;   Fair Remote;   Fair  Judgement:  Intact  Insight:  Present  Psychomotor Activity:  Normal  Concentration:  Concentration: Fair  Recall:  [employer] of Knowledge:  Fair  Language:  Good  Akathisia:  No  Handed:  Right  AIMS (if indicated):     Assets:  Desire for Improvement Financial Resources/Insurance Social Support Transportation  ADL's:  Intact  Cognition:  WNL  Sleep:         Treatment Plan Summary: Daily contact with patient to assess and evaluate symptoms and progress in treatment and Medication management Polysubstance abuse with opiate dependence, managed as below:   Medications: -COWS Protocol with clonidine and supportive meds within protocol -Trazodone '50mg'$  po qhs prn insomnia -Depakote 1500 mg Celexa  '30mg'$   And Saphris '5mg'$  (home med)  Disposition:  Overnight in [place] for safety and stabilization.-03/09/2016 PER Counselor Notes on 03/08/2016- ACTT team member stated that the team will meet in the morning and come up with a plan for this patient by noon. ACTT team member also reports that they will be responsible for picking the above mentioned pt up on tomorrow. ACTT team member reports that someone from the team will call after the morning meeting to conduct a follow-up.  Observation Level/Precautions:  Continuous Observation Laboratory:  CBC Chemistry Profile UDS UA Reviewed  Psychotherapy:   Medications:  Depakote 1500 mg, Celxa '30mg'$   And Saphris '5mg'$  Consultations:  Psychiatry Discharge Concerns:  Safety, stabilization, and risk of access to medication and medication stabilization  Estimated KDX:IPJA than 48 hours Other:      Derrill [place], NP 7/21/201711:14 AM

## 2016-03-09 NOTE — Progress Notes (Signed)
ACTT team in to speak with patient today. Patient continues to have some passive SI today. Patient to be observed tonight and discharged tomorrow to follow-up with ACTT team.

## 2016-03-09 NOTE — Progress Notes (Signed)
D:  Pt is pleasant and cooperative. Patient denies HI however he is passive SI and does verbally contract for safety. Patient desires to be admitted inpatient. A: Pt was offered support and encouragement. Pt was given scheduled medications. Patient was monitored continuously for safety.  R:. Pt is taking medication. Pt receptive to treatment and safety maintained on unit.

## 2016-03-10 ENCOUNTER — Encounter (HOSPITAL_COMMUNITY): Payer: Self-pay | Admitting: Registered Nurse

## 2016-03-10 DIAGNOSIS — F3131 Bipolar disorder, current episode depressed, mild: Secondary | ICD-10-CM | POA: Diagnosis not present

## 2016-03-10 DIAGNOSIS — F3132 Bipolar disorder, current episode depressed, moderate: Secondary | ICD-10-CM | POA: Diagnosis not present

## 2016-03-10 NOTE — Discharge Summary (Signed)
Physician Discharge Summary Note  Patient:  Ryan Bartlett is an 20 y.o., male MRN:  [id number] DOB:  08-31-1995 Patient phone:  [phone] (home)  Patient address:   [place] [address],  Total Time spent with patient: 30 minutes  Date of Admission:  03/08/2016 Date of Discharge: 03/10/16  Reason for Admission:  Off of medications "for a while" and reporting suicidal ideations with plans to overdose or hang himself  Principal Problem: Bipolar affective disorder, current episode depressed [facility]) Discharge Diagnoses: Patient Active Problem List   Diagnosis Date Noted  . 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 Bipolar affective disorder, current episode depressed ([place]) [F31.30] 03/08/2016  . Bipolar affect, depressed ([place]) [F31.30] 09/18/2015  . Bipolar affective disorder, currently depressed, mild ([place]) [F31.31] 09/17/2015  . Intentional overdose of drug in tablet form ([place]) [T50.902A]   . Major depressive disorder, recurrent episode, moderate ([place]) [F33.1] 07/09/2015  . Depression [F32.9]   . Suicidal ideation [R45.851]   . Cannabis use disorder, moderate, dependence ([place]) [F12.20] 06/06/2015  . Alcohol use disorder, moderate, dependence ([place]) [F10.20] 06/06/2015  . Bipolar I disorder, most recent episode depressed ([place]) [F31.30] 05/23/2013  . Cannabis abuse [F12.10] 03/30/2012  . ADHD (attention deficit hyperactivity disorder), combined type [F90.2] 08/28/2011  . Conduct disorder, adolescent onset type [F91.2] 08/28/2011    Past Psychiatric History: Patient has had multiple admissions ( [facility], [facility], [facility] and [facility]).  Outpatient services:  [facility] ACTT team.  Daily meth use for the past 3 months.     Past Medical History:  Past Medical History  Diagnosis Date  . ADHD (attention deficit hyperactivity disorder)   . Unspecified episodic mood disorder   . Oppositional defiant disorder   . Bipolar disorder ([place])   . Depression     Past  Surgical History  Procedure Laterality Date  . No past surgeries     Family History:  Family History  Problem Relation Age of Onset  . Depression Mother   . Diabetes Other   . Hyperlipidemia Other   . Hypertension Other    Family Psychiatric  History: Father alcoholism; Mother Depression Social History:  History  Alcohol Use  . 3.6 oz/week  . 6 Cans of beer per week    Comment: "I actually recently quit"     History  Drug Use  . Yes  . Special: Marijuana    Social History   Social History  . Marital Status: Single    Spouse Name: N/A  . Number of Children: N/A  . Years of Education: N/A   Occupational History  . student     12th grade at [employer] Topics  . Smoking status: Current Every Day Smoker -- 0.50 packs/day for 0 years    Types: Cigarettes  . Smokeless tobacco: Never Used  . Alcohol Use: 3.6 oz/week    6 Cans of beer per week     Comment: "I actually recently quit"  . Drug Use: Yes    Special: Marijuana  . Sexual Activity: Not Asked     Comment: Pt reports that he is not sexually active   Other Topics Concern  . None   Social History Narrative    Hospital Course:  MOODY ROBBEN was admitted to observation for Bipolar affective disorder, current episode depressed ([place]) and crisis management.  He was restarted on his home mediations  For mood stabilization and psychosis: Saphris 5 mg Bid; Depakote ER 1500 mg  Q hs; Major Depression Celexa 30 mg daily; and Insomnia Ambien 10 mg Q hs prn. Medications were tolerated with no adverse reactions.  Daylene Posey was discharged to continue with current medication and was instructed on how to take medications as prescribed; (details listed below under Medication List).   Improvement was monitored by observation and Daylene Posey report of symptom reduction.  Patient ACTT team met with patient and discussed follow up and plan.           Daylene Posey was evaluated for stability and  plans for continued recovery upon discharge.  Daylene Posey motivation was an integral factor for scheduling further treatment.  Employment, transportation, bed availability, health status, family support, and any pending legal issues were also considered during his during the 24 hour observation.  He was offered further treatment options upon discharge including but not limited to Residential, Intensive Outpatient, Outpatient treatment, and resources for shelters if needed.  Daylene Posey will follow up with the services as listed below under Follow up Information.     Upon completion of this admission the STEAVEN WHOLEY was both mentally and medically stable for discharge denying suicidal/homicidal ideation, auditory/visual/tactile hallucinations, delusional thoughts and paranoia.      Physical Findings: AIMS: Facial and Oral Movements Muscles of Facial Expression: None, normal Lips and Perioral Area: None, normal Jaw: None, normal Tongue: None, normal,Extremity Movements Upper (arms, wrists, hands, fingers): None, normal Lower (legs, knees, ankles, toes): None, normal, Trunk Movements Neck, shoulders, hips: None, normal, Overall Severity Severity of abnormal movements (highest score from questions above): None, normal Incapacitation due to abnormal movements: None, normal Patient's awareness of abnormal movements (rate only patient's report): No Awareness, Dental Status Current problems with teeth and/or dentures?: No Does patient usually wear dentures?: No  CIWA:    COWS:     Musculoskeletal: Strength & Muscle Tone: within normal limits Gait & Station: normal Patient leans: N/A  Psychiatric Specialty Exam: Physical Exam  Constitutional: He is oriented to person, place, and time.  Neck: Normal range of motion.  Respiratory: Effort normal.  Musculoskeletal: Normal range of motion.  Neurological: He is alert and oriented to person, place, and time. Coordination and gait  normal.    ROS  Blood pressure 118/72, pulse 58, temperature 97.9 F (36.6 C), temperature source Oral, resp. rate 16, height 5' 9.5" (1.765 m), weight 89.359 kg (197 lb), SpO2 100 %.Body mass index is 28.68 kg/(m^2).  General Appearance: Casual  Eye Contact:  Good  Speech:  Clear and Coherent and Normal Rate  Volume:  Normal  Mood:  Depressed  Affect:  Congruent  Thought Process:  Coherent and Goal Directed  Orientation:  Full (Time, Place, and Person)  Thought Content:  Logical  Suicidal Thoughts:  No  Homicidal Thoughts:  No  Memory:  Immediate;   Fair Recent;   Fair Remote;   Fair  Judgement:  Fair  Insight:  Fair  Psychomotor Activity:  Normal  Concentration:  Concentration: Fair and Attention Span: Fair  Recall:  Good  Fund of Knowledge:  Fair  Language:  Good  Akathisia:  No  Handed:  Right  AIMS (if indicated):     Assets:  Communication Skills Desire for Improvement Housing Physical Health Social Support  ADL's:  Intact  Cognition:  WNL  Sleep:        Have you used any form of tobacco in the last 30 days? (Cigarettes, Smokeless Tobacco, Cigars, and/or Pipes): Yes  Has this patient used any form of tobacco in the last 30 days? (Cigarettes, Smokeless Tobacco, Cigars, and/or Pipes) Yes, Yes, A prescription for an FDA-approved tobacco cessation medication was offered at discharge and the patient refused  Blood Alcohol level:  Lab Results  Component Value Date   [facility] <5 03/08/2016   ETH <5 34/19/6222    Metabolic Disorder Labs:  Lab Results  Component Value Date   HGBA1C 5.8* 06/02/2015   MPG 120 06/02/2015   MPG 126* 05/14/2012   No results found for: PROLACTIN Lab Results  Component Value Date   CHOL 199 06/02/2015   TRIG 390* 06/02/2015   HDL 31* 06/02/2015   CHOLHDL 6.4 06/02/2015   VLDL 78* 06/02/2015   [place] 90 06/02/2015   LDLCALC 118* 05/14/2012    See Psychiatric Specialty Exam and Suicide Risk Assessment completed by Attending  Physician prior to discharge.  Discharge destination:  Home  Is patient on multiple antipsychotic therapies at discharge:  No   Has Patient had three or more failed trials of antipsychotic monotherapy by history:  No  Recommended Plan for Multiple Antipsychotic Therapies: NA      Discharge Instructions    Activity as tolerated - No restrictions    Complete by:  As directed      Diet general    Complete by:  As directed      Discharge instructions    Complete by:  As directed   Take all of you medications as prescribed by your mental healthcare provider.  Report any adverse effects and reactions from your medications to your outpatient provider promptly.  Do not engage in alcohol and or illegal drug use while on prescription medicines. Keep all scheduled appointments. This is to ensure that you are getting refills on time and to avoid any interruption in your medication.  If you are unable to keep an appointment call to reschedule.  Be sure to follow up with resources and follow ups given. In the event of worsening symptoms call the crisis hotline, 911, and or go to the nearest emergency department for appropriate evaluation and treatment of symptoms. Follow-up with your primary care provider for your medical issues, concerns and or health care needs.     Increase activity slowly    Complete by:  As directed             Medication List    TAKE these medications      Indication   albuterol 108 (90 Base) MCG/ACT inhaler  Commonly known as:  PROVENTIL HFA;VENTOLIN HFA  Inhale 2 puffs into the lungs every 4 (four) hours as needed for wheezing or shortness of breath.   Indication:  Asthma     citalopram 10 MG tablet  Commonly known as:  CELEXA  Take 3 tablets (30 mg total) by mouth daily. For depression   Indication:  Depression     divalproex 500 MG 24 hr tablet  Commonly known as:  DEPAKOTE ER  Take 3 tablets (1,500 mg total) by mouth at bedtime. For mood stabilization    Indication:  Mood stabilization     nicotine polacrilex 2 MG gum  Commonly known as:  NICORETTE  Take 1 each (2 mg total) by mouth as needed for smoking cessation.   Indication:  Nicotine Addiction     omeprazole 20 MG capsule  Commonly known as:  PRILOSEC  Take 1 capsule (20 mg total) by mouth daily. For acid reflux   Indication:  Gastroesophageal Reflux Disease  SAPHRIS 5 MG Subl 24 hr tablet  Generic drug:  asenapine  Place 5 mg under the tongue 2 (two) times daily.      zolpidem 10 MG tablet  Commonly known as:  AMBIEN  Take 1 tablet (10 mg total) by mouth at bedtime as needed for sleep.   Indication:  Trouble Sleeping       Follow-up Information    Follow up with [facility] on 03/10/2016.   Why:  For follow up and services   Contact information:   [facility]/ACT Team ACT Coordinator      Follow-up recommendations:  Activity:  As tolerated Diet:  As tolerated  Comments:  JAHLEEL STROSCHEIN has been instructed to take medications as prescribed; and report adverse effects to outpatient provider.  Follow up with primary doctor for any medical issues and If symptoms recur report to nearest emergency or crisis hot line.  Patient to follow up with ACTT team  Signed: Earleen Newport, NP 03/10/2016, 11:48 AM

## 2016-03-10 NOTE — Progress Notes (Signed)
Patient d/c with belongings and AVS sheet with [facility] Coord[facility] at 1219.

## 2016-05-01 ENCOUNTER — Emergency Department (HOSPITAL_COMMUNITY)
Admission: EM | Admit: 2016-05-01 | Discharge: 2016-05-03 | Disposition: A | Discharge status: Home / Self Care | Payer: Medicaid Other | Attending: Emergency Medicine | Admitting: Emergency Medicine

## 2016-05-01 ENCOUNTER — Emergency Department (HOSPITAL_COMMUNITY): Payer: Medicaid Other

## 2016-05-01 ENCOUNTER — Encounter (HOSPITAL_COMMUNITY): Payer: Self-pay | Admitting: Emergency Medicine

## 2016-05-01 DIAGNOSIS — Y999 Unspecified external cause status: Secondary | ICD-10-CM | POA: Insufficient documentation

## 2016-05-01 DIAGNOSIS — R45851 Suicidal ideations: Secondary | ICD-10-CM | POA: Diagnosis not present

## 2016-05-01 DIAGNOSIS — F1290 Cannabis use, unspecified, uncomplicated: Secondary | ICD-10-CM | POA: Diagnosis not present

## 2016-05-01 DIAGNOSIS — S93401A Sprain of unspecified ligament of right ankle, initial encounter: Secondary | ICD-10-CM

## 2016-05-01 DIAGNOSIS — Z79899 Other long term (current) drug therapy: Secondary | ICD-10-CM | POA: Insufficient documentation

## 2016-05-01 DIAGNOSIS — F3131 Bipolar disorder, current episode depressed, mild: Secondary | ICD-10-CM | POA: Insufficient documentation

## 2016-05-01 DIAGNOSIS — F902 Attention-deficit hyperactivity disorder, combined type: Secondary | ICD-10-CM | POA: Diagnosis not present

## 2016-05-01 DIAGNOSIS — F17210 Nicotine dependence, cigarettes, uncomplicated: Secondary | ICD-10-CM | POA: Insufficient documentation

## 2016-05-01 DIAGNOSIS — Y92410 Unspecified street and highway as the place of occurrence of the external cause: Secondary | ICD-10-CM | POA: Insufficient documentation

## 2016-05-01 DIAGNOSIS — F3130 Bipolar disorder, current episode depressed, mild or moderate severity, unspecified: Secondary | ICD-10-CM

## 2016-05-01 DIAGNOSIS — S93491A Sprain of other ligament of right ankle, initial encounter: Secondary | ICD-10-CM | POA: Diagnosis not present

## 2016-05-01 DIAGNOSIS — J45909 Unspecified asthma, uncomplicated: Secondary | ICD-10-CM | POA: Insufficient documentation

## 2016-05-01 DIAGNOSIS — Z91010 Allergy to peanuts: Secondary | ICD-10-CM | POA: Insufficient documentation

## 2016-05-01 DIAGNOSIS — Y9389 Activity, other specified: Secondary | ICD-10-CM | POA: Diagnosis not present

## 2016-05-01 DIAGNOSIS — S99911A Unspecified injury of right ankle, initial encounter: Secondary | ICD-10-CM | POA: Diagnosis present

## 2016-05-01 HISTORY — DX: Unspecified asthma, uncomplicated: J45.909

## 2016-05-01 LAB — CBC
HCT: 40.3 % (ref 39.0–52.0)
HEMOGLOBIN: 13.7 g/dL (ref 13.0–17.0)
MCH: 28.4 pg (ref 26.0–34.0)
MCHC: 34 g/dL (ref 30.0–36.0)
MCV: 83.4 fL (ref 78.0–100.0)
Platelets: 233 10*3/uL (ref 150–400)
RBC: 4.83 MIL/uL (ref 4.22–5.81)
RDW: 13.5 % (ref 11.5–15.5)
WBC: 8.1 10*3/uL (ref 4.0–10.5)

## 2016-05-01 LAB — COMPREHENSIVE METABOLIC PANEL
ALK PHOS: 64 U/L (ref 38–126)
ALT: 15 U/L — AB (ref 17–63)
AST: 14 U/L — AB (ref 15–41)
Albumin: 4.3 g/dL (ref 3.5–5.0)
Anion gap: 4 — ABNORMAL LOW (ref 5–15)
BUN: 10 mg/dL (ref 6–20)
CALCIUM: 9.5 mg/dL (ref 8.9–10.3)
CO2: 27 mmol/L (ref 22–32)
CREATININE: 0.79 mg/dL (ref 0.61–1.24)
Chloride: 108 mmol/L (ref 101–111)
Glucose, Bld: 89 mg/dL (ref 65–99)
Potassium: 3.5 mmol/L (ref 3.5–5.1)
Sodium: 139 mmol/L (ref 135–145)
Total Bilirubin: 0.7 mg/dL (ref 0.3–1.2)
Total Protein: 7.5 g/dL (ref 6.5–8.1)

## 2016-05-01 LAB — RAPID URINE DRUG SCREEN, HOSP PERFORMED
AMPHETAMINES: NOT DETECTED
BARBITURATES: NOT DETECTED
Benzodiazepines: NOT DETECTED
Cocaine: NOT DETECTED
OPIATES: POSITIVE — AB
TETRAHYDROCANNABINOL: POSITIVE — AB

## 2016-05-01 LAB — SALICYLATE LEVEL

## 2016-05-01 LAB — ETHANOL

## 2016-05-01 LAB — ACETAMINOPHEN LEVEL: Acetaminophen (Tylenol), Serum: <10 ug/mL — ABNORMAL LOW (ref 10–30)

## 2016-05-01 MED ORDER — ONDANSETRON HCL 4 MG PO TABS: 4.0000 mg | ORAL_TABLET | Freq: Three times a day (TID) | ORAL | Status: DC | PRN

## 2016-05-01 MED ORDER — NICOTINE 21 MG/24HR TD PT24
21.0000 mg | MEDICATED_PATCH | Freq: Every day | TRANSDERMAL | Status: DC
  Filled 2016-05-01: qty 1

## 2016-05-01 MED ORDER — IBUPROFEN 200 MG PO TABS
600.0000 mg | ORAL_TABLET | Freq: Once | ORAL | Status: AC
  Administered 2016-05-01: 600 mg via ORAL
  Filled 2016-05-01: qty 3

## 2016-05-01 MED ORDER — ASENAPINE MALEATE 5 MG SL SUBL: 5.0000 mg | SUBLINGUAL_TABLET | Freq: Two times a day (BID) | SUBLINGUAL | Status: DC

## 2016-05-01 MED ORDER — ASENAPINE MALEATE 5 MG SL SUBL
10.0000 mg | SUBLINGUAL_TABLET | Freq: Two times a day (BID) | SUBLINGUAL | Status: DC
  Administered 2016-05-01 – 2016-05-03 (×5): 10 mg via SUBLINGUAL
  Filled 2016-05-01 (×7): qty 2

## 2016-05-01 MED ORDER — ZOLPIDEM TARTRATE 5 MG PO TABS: 5.0000 mg | ORAL_TABLET | Freq: Every evening | ORAL | Status: DC | PRN

## 2016-05-01 MED ORDER — QUETIAPINE FUMARATE 100 MG PO TABS
100.0000 mg | ORAL_TABLET | Freq: Every day | ORAL | Status: DC
  Administered 2016-05-01 – 2016-05-02 (×2): 100 mg via ORAL
  Filled 2016-05-01 (×2): qty 1

## 2016-05-01 MED ORDER — IBUPROFEN 200 MG PO TABS: 600.0000 mg | ORAL_TABLET | Freq: Three times a day (TID) | ORAL | Status: DC | PRN

## 2016-05-01 MED ORDER — ACETAMINOPHEN 325 MG PO TABS
650.0000 mg | ORAL_TABLET | ORAL | Status: DC | PRN
  Administered 2016-05-02: 650 mg via ORAL
  Filled 2016-05-01: qty 2
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
## 2016-05-01 NOTE — ED Notes (Signed)
Pt admitted to room #43. Pt behavior cooperative. Pt reports he came to the hospital d/t being suicidal and trying to jump out of a car. Pt endorsing SI. Denies HI. Denies AVH. Pt reports previous suicide attempts in the past. Pt reports he uses marijuana daily. Reports he is currently on probation for felony larceny. Pt reports he has been off his medication for a couple of months. Special checks q 15 mins in place for safety. Video monitoring in place.

## 2016-05-01 NOTE — Progress Notes (Addendum)
CSW spoke with Ryan Bartlett with the [facility]. She met with patient while here in the [facility]. She stated she could fax patient's medication list and fax number was provided [phone].  Medication list was faxed and placed in patient's chart. Nurse updated and reviewed the med list.     Ryan Bartlett [phone] ED CSW 05/01/2016 2:13 PM
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
## 2016-05-01 NOTE — Progress Notes (Signed)
05/01/16 1359:  LRT introduced self to pt and offered activities.  Pt was lying down with a blanket over his head but responded to questions.  Pt stated he may be interested later.  LRT will follow up with pt.   Caroll RancherMarjette Kolleen [facility], LRT/CTRS
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
## 2016-05-01 NOTE — ED Notes (Signed)
Bed: WA29 Expected date:  Expected time:  Means of arrival:  Comments: 

## 2016-05-01 NOTE — ED Notes (Signed)
Bed: [facility] Expected date:  Expected time:  Means of arrival:  Comments: 

## 2016-05-01 NOTE — ED Notes (Signed)
SBAR Report received from previous nurse. Pt received calm and visible on unit. Pt denies current SI/ HI, A/V H, anxiety, and pain at this time, but endorses depression and is otherwise stable. Pt reminded of camera surveillance, q 15 min rounds, and rules of the milieu. Will continue to assess.

## 2016-05-01 NOTE — Progress Notes (Signed)
05/01/16 1500:  LRT went to follow up with pt about doing an activity, pt was asleep.   Caroll RancherMarjette Deja Kaigler, LRT/CTRS

## 2016-05-02 MED ORDER — ASENAPINE MALEATE 5 MG SL SUBL: 10.0000 mg | SUBLINGUAL_TABLET | Freq: Two times a day (BID) | SUBLINGUAL | 0 refills | Status: DC

## 2016-05-02 MED ORDER — QUETIAPINE FUMARATE 100 MG PO TABS: 100.0000 mg | ORAL_TABLET | Freq: Every day | ORAL | 0 refills | Status: DC
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
## 2016-05-02 NOTE — Discharge Instructions (Signed)
[facility]

## 2016-05-02 NOTE — ED Notes (Signed)
Pt mother at bedside

## 2016-05-02 NOTE — BH Assessment (Signed)
[facility] Assessment Progress Note  Spoke with ACT Team who requested additional clarification on discharge meds.

## 2016-05-02 NOTE — Progress Notes (Signed)
The patient came to the ED yesterday with suicidal ideations and the ACT team requested medications to start as he is often non-compliant.  His Seroquel 100 mg at bedtime was restarted and his Saphris 5 mg BID was increased to 10 mg BID.  Last night, he slept well and was to be discharged to his ACT team.  However, his mother came and said he could not come live her, under no circumstances and he was "virtually homeless."  His big issue is shelter.  The ACT team, however, reported he has an apartment with some other guys.  It was decided that he could be held over night to give the medication more time as his mother did not feel 24 hours was enough. Mixed collateral information between the ACT team and mother.  Meanwhile, the disposition person will try and find him a bed but the plan is to discharge tomorrow am.  Caveat:  [facility] and his mother and ACT team report he is noncompliant with his medications, reminded them this is not a reason to be admitted to the hospital.  Nanine MeansJamison Cay Kath, PMH-NP

## 2016-05-02 NOTE — ED Notes (Signed)
Pt increasing in agitation and volume. Pt repeating over and over that he is scared and he is not fine. Pt will not acc3ept redirection

## 2016-05-02 NOTE — Progress Notes (Signed)
05/02/16 1409:  LRT went to pt room to offer activities, pt was sleep.  Caroll RancherMarjette Zacharias Ridling, LRT/CTRS

## 2016-05-02 NOTE — BH Assessment (Signed)
[facility] Assessment Progress Note  Spoke with Ryan Bartlett, ACT Team worker, [phone]. Confirmed that [facility] has not called regarding patient. Confirmed calls have been made and paperwork faxed by TTS. Ryan Bartlett spoke with supervisor. Called back to say Dr. Jeannine KittenFarah would have Ryan Bartlett in Intake give Ryan Bartlett a call to confirm acceptance of patient.

## 2016-05-02 NOTE — ED Notes (Signed)
Patient reports SI which is chronic. Patient denies HI and AVH at this time. Plan of care discussed and patient voices no complaints at this time. Encouragement and support provided and safety maintain. Q 15 min safety checks I in place.

## 2016-05-02 NOTE — ED Notes (Signed)
Pt behavior calm and cooperative. Pleasant on approach. Pt endorsing SI, reports chronic in nature. Pt denies HI. Denies AVH. Special checks q 15 mins in place for safety. Video monitoring in place.

## 2016-05-02 NOTE — BH Assessment (Signed)
[facility] Assessment Progress Note  This clinician received a call from [place], [place] [facility] Jeannine KittenFarah. [facility] and message left requesting a return call. Waiting to confirm acceptance and receive # for nurse to call report.   Mother also called to make TTS aware of admission. Mother to be notified if arrangements are confirmed before 430pm. Mother's phone number [phone]

## 2016-05-02 NOTE — BH Assessment (Signed)
[facility] Assessment Progress Note   Patient will continue in the SAPU overnight. TTS will work on placement. ACT Team to pick up tomorrow if no placement found and patient has stabilized.

## 2016-05-02 NOTE — ED Notes (Signed)
Pt mother at bedside expressing concerns in regards to pt being up for discharge home. This nurse spoke with Tawni LevyJameson Lord, NP. NP reports to not discharge pt at this time , will keep pt overnight while searching for placement and reevaluate in morning. Yessenia Maillet TTS notified.

## 2016-05-02 NOTE — BH Assessment (Signed)
[facility] Assessment Progress Note  This clinician contact ACT Team at [phone]. Faxed over d/c summary for update on medication. Request by Judeth CornfieldStephanie. Faxed to [phone]. ACT to pick up in a few hours.

## 2016-05-02 NOTE — [facility] Suicide Risk Assessment (Signed)
Suicide Risk Assessment  Discharge Assessment   [facility]   Principal Problem: Bipolar I disorder, most recent episode depressed [facility](HCC) Discharge Diagnoses:  Patient Active Problem List   Diagnosis Date Noted  . Bipolar I disorder, most recent episode depressed (HCC) [F31.30] 05/23/2013    Priority: High  . Cannabis abuse [F12.10] 03/30/2012    Priority: Medium  . ADHD (attention deficit hyperactivity disorder), combined type [F90.2] 08/28/2011    Priority: Medium  . 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 Bipolar affective disorder, current episode depressed (HCC) [F31.30] 03/08/2016  . Bipolar affect, depressed (HCC) [F31.30] 09/18/2015  . Bipolar affective disorder, currently depressed, mild (HCC) [F31.31] 09/17/2015  . Intentional overdose of drug in tablet form (HCC) [T50.902A]   . Major depressive disorder, recurrent episode, moderate (HCC) [F33.1] 07/09/2015  . Depression [F32.9]   . Suicidal ideation [R45.851]   . Cannabis use disorder, moderate, dependence (HCC) [F12.20] 06/06/2015  . Alcohol use disorder, moderate, dependence (HCC) [F10.20] 06/06/2015  . Conduct disorder, adolescent onset type [F91.2] 08/28/2011    Total Time spent with patient: 45 minutes  Musculoskeletal: Strength & Muscle Tone: within normal limits Gait & Station: normal Patient leans: N/A  Psychiatric Specialty Exam: Physical Exam  Constitutional: He is oriented to person, place, and time. He appears well-developed and well-nourished.  HENT:  Head: Normocephalic.  Neck: Normal range of motion.  Respiratory: Effort normal.  Musculoskeletal: Normal range of motion.  Neurological: He is alert and oriented to person, place, and time.  Skin: Skin is warm and dry.  Psychiatric: His speech is normal and behavior is normal. Judgment and thought content normal. Cognition and memory are normal. He exhibits a depressed mood.    Review of Systems   Constitutional: Negative.   HENT: Negative.   Eyes: Negative.   Respiratory: Negative.   Cardiovascular: Negative.   Gastrointestinal: Negative.   Genitourinary: Negative.   Musculoskeletal: Negative.   Skin: Negative.   Neurological: Negative.   Endo/Heme/Allergies: Negative.   Psychiatric/Behavioral: Positive for depression.    Blood pressure 114/56, pulse (!) 54, temperature 97.7 F (36.5 C), temperature source Oral, resp. rate 18, height 5\' 9"  (1.753 m), weight 79.4 kg (175 lb), SpO2 97 %.Body mass index is 25.84 kg/m.  General Appearance: Casual  Eye Contact:  Good  Speech:  Normal Rate  Volume:  Normal  Mood:  Depressed  Affect:  Congruent  Thought Process:  Coherent and Descriptions of Associations: Intact  Orientation:  Full (Time, Place, and Person)  Thought Content:  WDL  Suicidal Thoughts:  No  Homicidal Thoughts:  No  Memory:  Immediate;   Good Recent;   Good Remote;   Good  Judgement:  Fair  Insight:  Fair  Psychomotor Activity:  Normal  Concentration:  Concentration: Good and Attention Span: Good  Recall:  Good  Fund of Knowledge:  Fair  Language:  Good  Akathisia:  No  Handed:  Right  AIMS (if indicated):     Assets:  Housing Leisure Time Physical Health Resilience Social Support  ADL's:  Intact  Cognition:  WNL  Sleep:       Mental Status Per Nursing Assessment::   On Admission:     Demographic Factors:  Male and Adolescent or young adult  Loss Factors: NA  Historical Factors: Impulsivity  Risk Reduction Factors:   Sense of responsibility to family, Living with another person, especially a relative, Positive social support and Positive therapeutic relationship  Continued  Clinical Symptoms:  Depression, mild  Cognitive Features That Contribute To Risk:  None    Suicide Risk:  Minimal: No identifiable suicidal ideation.  Patients presenting with no risk factors but with morbid ruminations; may be classified as minimal risk based  on the severity of the depressive symptoms    Plan Of Care/Follow-up recommendations:  Activity:  as tolerated Diet:  heart healthy diet  Pippa Hanif, NP 05/02/2016, 10:43 AM

## 2016-05-03 DIAGNOSIS — F3130 Bipolar disorder, current episode depressed, mild or moderate severity, unspecified: Secondary | ICD-10-CM

## 2016-05-03 NOTE — ED Provider Notes (Signed)
Patient seen for IVC completion. Patient with attempted self injury and risky behavior by jumping from moving vehicle. Patient has been assessed by Child psychotherapistsocial worker and patient has endorsed suicidal thoughts and plans for self injury.  I have seen the patient. He continues to endorse depression and has flat affect. Patient is A&Ox4. [place]/AT. Heart regular no R/M/G.  No respiratory distress lungs CTA. Continues to endorse tenderness RT ankle. No deformity N/V intact.  IVC completed for planned inpatient treatment. Patient may have conservative treatment for minor ankle sprain without evident fracture, deformity or N/V injury. Ice, elevation.   Arby BarretteMarcy Gracen Ringwald, MD 05/03/16 [phone]

## 2016-05-03 NOTE — ED Notes (Signed)
Patient continues to endorse SI and deny HI and AVH at this time. Plan of care discussed with patient. Patient voices no complaints or concerns at this time. Encouragement and support provided and safety maintain. Q 15 min safety checks in place and video monitoring.

## 2016-05-03 NOTE — Consult Note (Signed)
[facility]   Reason for Consult:  Suicidal ideations  Referring Physician:  EDP Patient Identification: Ryan Bartlett [place] MRN:  [id number] Principal Diagnosis: Bipolar I disorder, most recent episode depressed [facility](HCC) Diagnosis:   Patient Active Problem List   Diagnosis Date Noted  . 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 Bipolar affective disorder, current episode depressed (HCC) [F31.30] 03/08/2016  . Bipolar affect, depressed (HCC) [F31.30] 09/18/2015  . Bipolar affective disorder, currently depressed, mild (HCC) [F31.31] 09/17/2015  . Intentional overdose of drug in tablet form (HCC) [T50.902A]   . Major depressive disorder, recurrent episode, moderate (HCC) [F33.1] 07/09/2015  . Depression [F32.9]   . Suicidal ideation [R45.851]   . Cannabis use disorder, moderate, dependence (HCC) [F12.20] 06/06/2015  . Alcohol use disorder, moderate, dependence (HCC) [F10.20] 06/06/2015  . Bipolar I disorder, most recent episode depressed (HCC) [F31.30] 05/23/2013  . Cannabis abuse [F12.10] 03/30/2012  . ADHD (attention deficit hyperactivity disorder), combined type [F90.2] 08/28/2011  . Conduct disorder, adolescent onset type [F91.2] 08/28/2011    Total Time spent with patient: 45 minutes  Subjective:   Ryan Bartlett Needham is a 20 y.o. male patient does not warrant admission.  HPI:  20 yo male who presented to the ED with suicidal ideations as he tried to jump out of a car.  He is well known to these providers and ED as he is here frequently.  ACT team was notified yesterday and requested his medications be restarted which they were.  Today, he denies suicidal/homicidal ideations, hallucinations, and withdrawal issues.  Patient's ACT team will be called to come and get the patient as he is stable for discharge.  Past Psychiatric History: bipolar disorder  Risk to Self: Suicidal Ideation: Yes-Currently Present Suicidal Intent: Yes-Currently Present Is patient at  risk for suicide?: Yes Suicidal Plan?: Yes-Currently Present Specify Current Suicidal Plan: jump from car again, drink bleach, jump [place] Access to Means: Yes Specify Access to Suicidal Means: access to car, bleach, highway What has been your use of drugs/alcohol within the last 12 months?: last used meth 03/06/16, daily thc use How many times?:  ("it has been a lot of times") Other Self Harm Risks: none Triggers for Past Attempts: Unpredictable Intentional Self Injurious Behavior: None Risk to Others: Homicidal Ideation: No Thoughts of Harm to Others: Yes-Currently Present Comment - Thoughts of Harm to Others: pt jumped from car b/c didn'Bartlett want to hurt others in car Current Homicidal Intent: No Current Homicidal Plan: No Access to Homicidal Means: No Identified Victim: none History of harm to others?: No Assessment of Violence: None Noted Violent Behavior Description: pt denies hx violence Does patient have access to weapons?:  (pt reports sledgehammer in their tool shed, no guns) Criminal Charges Pending?: Yes Describe Pending Criminal Charges: simple possess sch VI CS Does patient have a court date: Yes Court Date: 05/04/16 Prior Inpatient Therapy: Prior Inpatient Therapy: Yes Prior Therapy Dates: over several years Prior Therapy Facilty/Provider(s): [facility], [facility], [facility] Reason for Treatment: SI, bipolar d/o Prior Outpatient Therapy: Prior Outpatient Therapy: Yes Prior Therapy Dates: recently Prior Therapy Facilty/Provider(s): [facility] Reason for Treatment: ACTT - med management Does patient have an ACCT team?: Yes Does patient have Intensive In-House Services?  : No Does patient have [facility] services? : Unknown Does patient have [facility] services?: Unknown  Past Medical History:  Past Medical History:  Diagnosis Date  . ADHD (attention deficit hyperactivity disorder)   . Asthma   . Bipolar  disorder (HCC)   .  Depression   . Oppositional defiant disorder   . Unspecified episodic mood disorder     Past Surgical History:  Procedure Laterality Date  . NO PAST SURGERIES     Family History:  Family History  Problem Relation Age of Onset  . Depression Mother   . Diabetes Other   . Hyperlipidemia Other   . Hypertension Other    Family Psychiatric  History: none Social History:  History  Alcohol Use  . 3.6 oz/week  . 6 Cans of beer per week    Comment: "I actually recently quit"     History  Drug Use  . Types: Marijuana, Methamphetamines    Social History   Social History  . Marital status: Single    Spouse name: N/A  . Number of children: N/A  . Years of education: N/A   Occupational History  . student Minor    12th grade at [employer]   Social History Main Topics  . Smoking status: Current Every Day Smoker    Packs/day: 0.50    Years: 0.00    Types: Cigarettes  . Smokeless tobacco: Never Used  . Alcohol use 3.6 oz/week    6 Cans of beer per week     Comment: "I actually recently quit"  . Drug use:     Types: Marijuana, Methamphetamines  . Sexual activity: Not Asked     Comment: Pt reports that he is not sexually active   Other Topics Concern  . None   Social History Narrative  . None   Additional Social History:    Allergies:   Allergies  Allergen Reactions  . Peanut-Containing Drug Products Anaphylaxis  . Lactose Intolerance (Gi) Diarrhea and Nausea And Vomiting    Labs:  No results found for this or any previous visit (from the past 48 hour(s)).  Current Facility-Administered Medications  Medication Dose Route Frequency Provider Last Rate Last Dose  . acetaminophen (TYLENOL) tablet 650 mg  650 mg Oral Q4H PRN Pricilla Loveless, MD   650 mg at 05/02/16 1828  . asenapine (SAPHRIS) sublingual tablet 10 mg  10 mg Sublingual BID Charm Rings, NP   10 mg at 05/03/16 1157  . ibuprofen (ADVIL,MOTRIN) tablet 600 mg  600 mg Oral Q8H PRN Pricilla Loveless, MD      .  nicotine (NICODERM CQ - dosed in mg/24 hours) patch 21 mg  21 mg Transdermal Daily Pricilla Loveless, MD      . ondansetron (ZOFRAN) tablet 4 mg  4 mg Oral Q8H PRN Pricilla Loveless, MD      . QUEtiapine (SEROQUEL) tablet 100 mg  100 mg Oral QHS Pricilla Loveless, MD   100 mg at 05/02/16 2113  . zolpidem (AMBIEN) tablet 5 mg  5 mg Oral QHS PRN Pricilla Loveless, MD       Current Outpatient Prescriptions  Medication Sig Dispense Refill  . asenapine (SAPHRIS) 5 MG SUBL 24 hr tablet Place 5 mg under the tongue 2 (two) times daily.    . QUEtiapine (SEROQUEL) 100 MG tablet Take 100 mg by mouth at bedtime.    Marland Kitchen asenapine (SAPHRIS) 5 MG SUBL 24 hr tablet Place 2 tablets (10 mg total) under the tongue 2 (two) times daily. 60 tablet 0  . QUEtiapine (SEROQUEL) 100 MG tablet Take 1 tablet (100 mg total) by mouth at bedtime. 30 tablet 0    Musculoskeletal: Strength & Muscle Tone: within normal limits Gait & Station: normal Patient  leans: N/A  Psychiatric Specialty Exam: Physical Exam  Constitutional: He is oriented to person, place, and time. He appears well-developed and well-nourished.  HENT:  Head: Normocephalic.  Neck: Normal range of motion.  Respiratory: Effort normal.  Musculoskeletal: Normal range of motion.  Neurological: He is alert and oriented to person, place, and time.  Skin: Skin is warm and dry.  Psychiatric: His speech is normal and behavior is normal. Judgment and thought content normal. Cognition and memory are normal. He exhibits a depressed mood.    Review of Systems  Constitutional: Negative.   HENT: Negative.   Eyes: Negative.   Respiratory: Negative.   Cardiovascular: Negative.   Gastrointestinal: Negative.   Genitourinary: Negative.   Musculoskeletal: Negative.   Skin: Negative.   Neurological: Negative.   Endo/Heme/Allergies: Negative.   Psychiatric/Behavioral: Positive for depression.    Blood pressure 128/75, pulse (!) 55, temperature 97.6 F (36.4 C), temperature  source Oral, resp. rate 16, height 5\' 9"  (1.753 m), weight 79.4 kg (175 lb), SpO2 97 %.Body mass index is 25.84 kg/m.  General Appearance: Casual  Eye Contact:  Good  Speech:  Normal Rate  Volume:  Normal  Mood:  Depressed  Affect:  Congruent  Thought Process:  Coherent and Descriptions of Associations: Intact  Orientation:  Full (Time, Place, and Person)  Thought Content:  WDL  Suicidal Thoughts:  No  Homicidal Thoughts:  No  Memory:  Immediate;   Good Recent;   Good Remote;   Good  Judgement:  Fair  Insight:  Fair  Psychomotor Activity:  Normal  Concentration:  Concentration: Good and Attention Span: Good  Recall:  Good  Fund of Knowledge:  Fair  Language:  Good  Akathisia:  No  Handed:  Right  AIMS (if indicated):     Assets:  Housing Leisure Time Physical Health Resilience Social Support  ADL's:  Intact  Cognition:  WNL  Sleep:      Treatment Plan Summary: Daily contact with patient to assess and evaluate symptoms and progress in treatment, Medication management and Plan bipolar affective disorder, most recent episode depressed, mild  -Crisis stabilization -Medication management:  Seroquel 100 mg at bedtime for sleep restarted.  His Saphris 5 mg BId for mood was increased to 10 mg BID for mood stabilization -Individual counseling  Disposition: Recommend psychiatric Inpatient admission when medically cleared.  Seeking placement, [facility], accepting is Dr Zetta Bills [facility], NP [facility] 05/03/2016 4:08 PM

## 2016-05-03 NOTE — [facility] Counselor (Signed)
Magistrate Patricia PesaMendenhall confirms receipt of faxed IVC paperwork. Pt's RN to arrange transportation to [facility] once pt has been served.   Ryan Bartlett, [place] Therapeutic Triage Specialist

## 2016-05-03 NOTE — ED Notes (Signed)
[facility].

## 2016-05-03 NOTE — ED Notes (Signed)
Attempted to call report. Staff still in shift change report will call back in 30 min.

## 2016-05-03 NOTE — ED Notes (Signed)
Ryan Bartlett has been in the bed most of the day.  He has been up a couple of times to use the phone or the bathroom and goes right back to bed.  He has taken his scheduled medications today and has been cooperative with staff.  His appetite has been good.

## 2016-05-03 NOTE — BH Assessment (Signed)
[facility] Assessment Progress Note  This clinician spoke with Th[facility] Ohmhris at [facility] who confirmed Dr. Jeannine KittenFarah has agreed to accept patient. Need appropriate paperwork per Thayer Ohmhris. Call back to [phone] for final approval and number for nurse to call report.

## 2016-05-03 NOTE — Progress Notes (Signed)
05/03/16 1347:  LRT went to pt room to offer activities, pt was sleep.  Caroll RancherMarjette Angi Goodell, LRT/CTRS

## 2016-05-03 NOTE — ED Notes (Signed)
[facility] for continuation of specialized care. Belongings signed for and given to deputy. Pt left in no acute distress.

## 2016-05-03 NOTE — BH Assessment (Signed)
[facility] Assessment Progress Note  Clinician contacted ACT Team for patient and spoke with ACT team member Cleo. She states she doesn't know if [facility]. She states the team will have a meeting at 1430 and will discuss patient. This clinician will wait for a call back.

## 2016-05-03 NOTE — BHH Suicide Risk Assessment (Cosign Needed)
Suicide Risk Assessment  Discharge Assessment   [facility] Discharge Suicide Risk Assessment   Principal Problem: Bipolar I disorder, most recent episode depressed [facility]) Discharge Diagnoses:  Patient Active Problem List   Diagnosis Date Noted  . 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 Bipolar affective disorder, current episode depressed (HCC) [F31.30] 03/08/2016  . Bipolar affect, depressed (HCC) [F31.30] 09/18/2015  . Bipolar affective disorder, currently depressed, mild (HCC) [F31.31] 09/17/2015  . Intentional overdose of drug in tablet form (HCC) [T50.902A]   . Major depressive disorder, recurrent episode, moderate (HCC) [F33.1] 07/09/2015  . Depression [F32.9]   . Suicidal ideation [R45.851]   . Cannabis use disorder, moderate, dependence (HCC) [F12.20] 06/06/2015  . Alcohol use disorder, moderate, dependence (HCC) [F10.20] 06/06/2015  . Bipolar I disorder, most recent episode depressed (HCC) [F31.30] 05/23/2013  . Cannabis abuse [F12.10] 03/30/2012  . ADHD (attention deficit hyperactivity disorder), combined type [F90.2] 08/28/2011  . Conduct disorder, adolescent onset type [F91.2] 08/28/2011    Total Time spent with patient: 30 minutes  Musculoskeletal: Strength & Muscle Tone: within normal limits Gait & Station: normal Patient leans: N/A Psychiatric Specialty Exam: Physical Exam  Constitutional: He is oriented to person, place, and time. He appears well-developed and well-nourished.  HENT:  Head: Normocephalic.  Neck: Normal range of motion.  Respiratory: Effort normal.  Musculoskeletal: Normal range of motion.  Neurological: He is alert and oriented to person, place, and time.  Skin: Skin is warm and dry.  Psychiatric: His speech is normal and behavior is normal. Judgment and thought content normal. Cognition and memory are normal. He exhibits a depressed mood.    Review of Systems  Constitutional: Negative.   HENT: Negative.   Eyes: Negative.   Respiratory:  Negative.   Cardiovascular: Negative.   Gastrointestinal: Negative.   Genitourinary: Negative.   Musculoskeletal: Negative.   Skin: Negative.   Neurological: Negative.   Endo/Heme/Allergies: Negative.   Psychiatric/Behavioral: Positive for depression.    Blood pressure 128/75, pulse (!) 55, temperature 97.6 F (36.4 C), temperature source Oral, resp. rate 16, height 5\' 9"  (1.753 m), weight 79.4 kg (175 lb), SpO2 97 %.Body mass index is 25.84 kg/m.  General Appearance: Casual  Eye Contact:  Good  Speech:  Normal Rate  Volume:  Normal  Mood:  Depressed  Affect:  Congruent  Thought Process:  Coherent and Descriptions of Associations: Intact  Orientation:  Full (Time, Place, and Person)  Thought Content:  WDL  Suicidal Thoughts:  No  Homicidal Thoughts:  No  Memory:  Immediate;   Good Recent;   Good Remote;   Good  Judgement:  Fair  Insight:  Fair  Psychomotor Activity:  Normal  Concentration:  Concentration: Good and Attention Span: Good  Recall:  Good  Fund of Knowledge:  Fair  Language:  Good  Akathisia:  No  Handed:  Right  AIMS (if indicated):     Assets:  Housing Leisure Time Physical Health Resilience Social Support  ADL's:  Intact  Cognition:  WNL  Sleep:      Mental Status Per Nursing Assessment::   On Admission:     Demographic Factors:  Low socioeconomic status and Living alone  Loss Factors: Loss of significant relationship  Historical Factors: Impulsivity  Risk Reduction Factors:   Positive social support  Continued Clinical Symptoms:  Bipolar Disorder:   Depressive phase  Cognitive Features That Contribute To Risk:  Closed-mindedness and Thought constriction (tunnel vision)    Suicide Risk:  Mild:  Suicidal ideation of limited  frequency, intensity, duration, and specificity.  There are no identifiable plans, no associated intent, mild dysphoria and related symptoms, good self-control (both objective and subjective assessment), few other  risk factors, and identifiable protective factors, including available and accessible social support.    Plan Of Care/Follow-up recommendations:  Activity:  as tol Diet:  as Ryan Bartlett  Taggart Prasad May Adenike Shidler, NP [facility] 05/03/2016, 4:40 PM

## 2016-05-03 NOTE — BH Assessment (Signed)
[facility] Assessment Progress Note  Faxed papework to several MH facilities. [facility], [facility], [facility], [facility] patients over 21, [facility] has no beds, [facility]. Ryan Bartlett will not take due to drug use. Left Message with [facility] for call back.

## 2016-05-03 NOTE — BH Assessment (Signed)
[facility]  Dr. Jeannine KittenFarah has accepted patient per [facility]. Nurse can call report to [phone] after 1730pm.  IVC paperwork will be faxed to magistrate.

## 2016-06-20 ENCOUNTER — Emergency Department (HOSPITAL_COMMUNITY)
Admission: EM | Admit: 2016-06-20 | Discharge: 2016-06-21 | Disposition: A | Discharge status: Home / Self Care | Payer: Medicaid Other | Attending: Emergency Medicine | Admitting: Emergency Medicine

## 2016-06-20 ENCOUNTER — Encounter (HOSPITAL_COMMUNITY): Payer: Self-pay

## 2016-06-20 DIAGNOSIS — R45851 Suicidal ideations: Secondary | ICD-10-CM

## 2016-06-20 DIAGNOSIS — F909 Attention-deficit hyperactivity disorder, unspecified type: Secondary | ICD-10-CM | POA: Diagnosis not present

## 2016-06-20 DIAGNOSIS — F3130 Bipolar disorder, current episode depressed, mild or moderate severity, unspecified: Secondary | ICD-10-CM | POA: Diagnosis not present

## 2016-06-20 DIAGNOSIS — Z79899 Other long term (current) drug therapy: Secondary | ICD-10-CM | POA: Diagnosis not present

## 2016-06-20 DIAGNOSIS — J45909 Unspecified asthma, uncomplicated: Secondary | ICD-10-CM | POA: Diagnosis not present

## 2016-06-20 DIAGNOSIS — R443 Hallucinations, unspecified: Secondary | ICD-10-CM | POA: Diagnosis not present

## 2016-06-20 DIAGNOSIS — F17210 Nicotine dependence, cigarettes, uncomplicated: Secondary | ICD-10-CM | POA: Diagnosis not present

## 2016-06-20 NOTE — ED Notes (Signed)
Pt is here voluntarily with [employer], he states that his medication isn't working and when he lays down he hears voices that scare him.

## 2016-06-20 NOTE — ED Notes (Signed)
Pt states that he's suicidal and he's going to bash his head in a brick wall

## 2016-06-20 NOTE — ED Notes (Signed)
Bed: [facility] Expected date:  Expected time:  Means of arrival:  Comments: 

## 2016-06-21 ENCOUNTER — Encounter (HOSPITAL_COMMUNITY): Payer: Self-pay | Admitting: Emergency Medicine

## 2016-06-21 DIAGNOSIS — F3130 Bipolar disorder, current episode depressed, mild or moderate severity, unspecified: Secondary | ICD-10-CM

## 2016-06-21 LAB — CBC
HCT: 43.9 % (ref 39.0–52.0)
Hemoglobin: 15.3 g/dL (ref 13.0–17.0)
MCH: 28.7 pg (ref 26.0–34.0)
MCHC: 34.9 g/dL (ref 30.0–36.0)
MCV: 82.2 fL (ref 78.0–100.0)
PLATELETS: 235 10*3/uL (ref 150–400)
RBC: 5.34 MIL/uL (ref 4.22–5.81)
RDW: 13.1 % (ref 11.5–15.5)
WBC: 10.6 10*3/uL — AB (ref 4.0–10.5)

## 2016-06-21 LAB — COMPREHENSIVE METABOLIC PANEL
ALT: 18 U/L (ref 17–63)
AST: 19 U/L (ref 15–41)
Albumin: 4.9 g/dL (ref 3.5–5.0)
Alkaline Phosphatase: 71 U/L (ref 38–126)
Anion gap: 7 (ref 5–15)
BILIRUBIN TOTAL: 0.6 mg/dL (ref 0.3–1.2)
BUN: 11 mg/dL (ref 6–20)
CHLORIDE: 101 mmol/L (ref 101–111)
CO2: 27 mmol/L (ref 22–32)
Calcium: 9.6 mg/dL (ref 8.9–10.3)
Creatinine, Ser: 0.84 mg/dL (ref 0.61–1.24)
Glucose, Bld: 87 mg/dL (ref 65–99)
POTASSIUM: 3.8 mmol/L (ref 3.5–5.1)
Sodium: 135 mmol/L (ref 135–145)
TOTAL PROTEIN: 8.2 g/dL — AB (ref 6.5–8.1)

## 2016-06-21 LAB — RAPID URINE DRUG SCREEN, HOSP PERFORMED
AMPHETAMINES: NOT DETECTED
BENZODIAZEPINES: NOT DETECTED
Barbiturates: NOT DETECTED
Cocaine: NOT DETECTED
OPIATES: NOT DETECTED
Tetrahydrocannabinol: POSITIVE — AB

## 2016-06-21 LAB — ACETAMINOPHEN LEVEL: Acetaminophen (Tylenol), Serum: <10 ug/mL — ABNORMAL LOW (ref 10–30)

## 2016-06-21 LAB — SALICYLATE LEVEL

## 2016-06-21 LAB — ETHANOL

## 2016-06-21 MED ORDER — ONDANSETRON HCL 4 MG PO TABS: 4.0000 mg | ORAL_TABLET | Freq: Three times a day (TID) | ORAL | Status: DC | PRN

## 2016-06-21 MED ORDER — IBUPROFEN 200 MG PO TABS: 600.0000 mg | ORAL_TABLET | Freq: Three times a day (TID) | ORAL | Status: DC | PRN

## 2016-06-21 MED ORDER — NICOTINE 21 MG/24HR TD PT24: 21.0000 mg | MEDICATED_PATCH | Freq: Every day | TRANSDERMAL | Status: DC

## 2016-06-21 MED ORDER — ALUM & MAG HYDROXIDE-SIMETH 200-200-20 MG/5ML PO SUSP: 30.0000 mL | ORAL | Status: DC | PRN

## 2016-06-21 MED ORDER — ACETAMINOPHEN 325 MG PO TABS: 650.0000 mg | ORAL_TABLET | ORAL | Status: DC | PRN

## 2016-06-21 MED ORDER — ZOLPIDEM TARTRATE 5 MG PO TABS: 5.0000 mg | ORAL_TABLET | Freq: Every evening | ORAL | Status: DC | PRN

## 2016-06-21 MED ORDER — LORAZEPAM 1 MG PO TABS: 1.0000 mg | ORAL_TABLET | Freq: Three times a day (TID) | ORAL | Status: DC | PRN

## 2016-06-21 NOTE — BH Assessment (Signed)
[facility] Assessment Progress Note  Per Thedore MinsMojeed Akintayo, MD, this pt does not require psychiatric hospitalization at this time.  [facility] with recommendation to follow up with [facility], his ACT Team provider.  This has been included in pt's discharge instructions.  At 11:57 this Clinical research associatewriter called [facility] and spoke to MaldenStephanie.  She reports that she will [facility] to [facility] within the hour to pick pt up.  Pt's nurse, Dawnaly, has been notified.  Doylene Canninghomas Honour Schwieger, MA Triage Specialist [phone]

## 2016-06-21 NOTE — ED Notes (Signed)
Safety search performed for contraband and no contraband found.

## 2016-06-21 NOTE — ED Provider Notes (Signed)
[facility]-EMERGENCY DEPT Provider Note   CSN: [id number] Arrival date & time: 06/20/16  2320 By signing my name below, I, Linus GalasMaharshi Patel, attest that this documentation has been prepared under the direction and in the presence of [name], MD. Electronically Signed: Linus GalasMaharshi Patel, ED Scribe. 06/21/16. 12:49 AM.   History   Chief Complaint Chief Complaint  Patient presents with  . hallucinating   HPI Comments: Ryan Bartlett is a 20 y.o. male who presents to the Emergency Department complaining of suicidal and homicidal ideation prior to arrival. Pt has been taking his medication faithfully until Monday. He was then seen at [facility] who changed his medication. Since then, he has been having these complaints. Pt now hears voices inside his head which were initially nice but then progressed negatively to telling him to kill himself and harm his brother. He wants to take as many medication as he can to kill himself. He does not have a homicidal plan. Pt denies any leg swelling or any other symptoms at this time. Pt denies any recent long travels  Pt uses marijuana every other day.   The history is provided by the patient.  Mental Health Problem  Presenting symptoms: hallucinations   Patient accompanied by: none. Degree of incapacity (severity):  Severe Onset quality:  Gradual Timing:  Constant Progression:  Worsening Chronicity:  Chronic Context: not stressful life event   Treatment compliance:  All of the time Relieved by:  Nothing Worsened by:  Nothing Ineffective treatments:  Antidepressants Associated symptoms: no abdominal pain, no chest pain and no fatigue   Risk factors: hx of mental illness     Past Medical History:  Diagnosis Date  . ADHD (attention deficit hyperactivity disorder)   . Asthma   . Bipolar disorder (HCC)   . Depression   . Oppositional defiant disorder   . Unspecified episodic mood disorder     Patient Active Problem List   Diagnosis Date  Noted  . 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 Bipolar affective disorder, current episode depressed (HCC) 03/08/2016  . Bipolar affect, depressed (HCC) 09/18/2015  . Bipolar affective disorder, currently depressed, mild (HCC) 09/17/2015  . Intentional overdose of drug in tablet form (HCC)   . Major depressive disorder, recurrent episode, moderate (HCC) 07/09/2015  . Depression   . Suicidal ideation   . Cannabis use disorder, moderate, dependence (HCC) 06/06/2015  . Alcohol use disorder, moderate, dependence (HCC) 06/06/2015  . Bipolar I disorder, most recent episode depressed (HCC) 05/23/2013  . Cannabis abuse 03/30/2012  . ADHD (attention deficit hyperactivity disorder), combined type 08/28/2011  . Conduct disorder, adolescent onset type 08/28/2011    Past Surgical History:  Procedure Laterality Date  . NO PAST SURGERIES       Home Medications    Prior to Admission medications   Medication Sig Start Date End Date Taking? Authorizing Provider  benztropine (COGENTIN) 1 MG tablet Take 1 mg by mouth 2 (two) times daily. 06/15/16 06/15/17 Yes Historical Provider, MD  divalproex (DEPAKOTE ER) 500 MG 24 hr tablet Take 500 mg by mouth 2 (two) times daily. 06/15/16 08/14/16 Yes Historical Provider, MD  risperiDONE (RISPERDAL) 3 MG tablet Take 3 mg by mouth at bedtime. 06/15/16 08/14/16 Yes Historical Provider, MD  asenapine (SAPHRIS) 5 MG SUBL 24 hr tablet Place 2 tablets (10 mg total) under the tongue 2 (two) times daily. Patient not taking: Reported on 06/20/2016 05/02/16   Charm RingsJamison Y Lord, NP  QUEtiapine (SEROQUEL) 100 MG tablet Take 1 tablet (  100 mg total) by mouth at bedtime. Patient not taking: Reported on 06/20/2016 05/02/16   Charm Rings, NP    Family History Family History  Problem Relation Age of Onset  . Depression Mother   . Diabetes Other   . Hyperlipidemia Other   . Hypertension Other     Social History Social History  Substance Use Topics  . Smoking status: Current  Every Day Smoker    Packs/day: 0.50    Years: 0.00    Types: Cigarettes  . Smokeless tobacco: Never Used  . Alcohol use 3.6 oz/week    6 Cans of beer per week     Comment: "I actually recently quit"     Allergies   Peanut-containing drug products and Lactose intolerance (gi)   Review of Systems Review of Systems  Constitutional: Negative for fatigue.  HENT: Negative for ear pain.   Respiratory: Negative for shortness of breath.   Cardiovascular: Negative for chest pain.  Gastrointestinal: Negative for abdominal pain.  Psychiatric/Behavioral: Positive for dysphoric mood and hallucinations.  All other systems reviewed and are negative.    Physical Exam Updated Vital Signs BP 131/88 (BP Location: Left Arm)   Pulse 72   Temp 98.3 F (36.8 C) (Oral)   Resp 20   Ht 5\' 9"  (1.753 m)   Wt 210 lb 8 oz (95.5 kg)   SpO2 100%   BMI 31.09 kg/m   Physical Exam  Constitutional: He is oriented to person, place, and time. He appears well-developed and well-nourished. No distress.  HENT:  Head: Normocephalic and atraumatic.  Mouth/Throat: Oropharynx is clear and moist. No oropharyngeal exudate.  Moist mucous membranes   Eyes: Conjunctivae are normal. Pupils are equal, round, and reactive to light.  Neck: Normal range of motion. Neck supple. No JVD present.  Trachea midline No bruit  Cardiovascular: Normal rate, regular rhythm, normal heart sounds and intact distal pulses.   Pulmonary/Chest: Effort normal and breath sounds normal. No stridor. No respiratory distress.  Abdominal: Soft. Bowel sounds are normal. He exhibits no distension.  Musculoskeletal: Normal range of motion.  Neurological: He is alert and oriented to person, place, and time. He has normal reflexes.  Skin: Skin is warm and dry. Capillary refill takes less than 2 seconds.  Psychiatric: His speech is not delayed. He expresses homicidal and suicidal ideation. He expresses suicidal plans. He expresses no homicidal  plans.  Nursing note and vitals reviewed.  ED Treatments / Results   Vitals:   06/20/16 2324  BP: 131/88  Pulse: 72  Resp: 20  Temp: 98.3 F (36.8 C)   DIAGNOSTIC STUDIES: Oxygen Saturation is 100% on room air, normal by my interpretation.    COORDINATION OF CARE: 12:49 AM Discussed treatment plan with pt at bedside and pt agreed to plan.  Results for orders placed or performed during the hospital encounter of 06/20/16  Comprehensive metabolic panel  Result Value Ref Range   Sodium 135 135 - 145 mmol/L   Potassium 3.8 3.5 - 5.1 mmol/L   Chloride 101 101 - 111 mmol/L   CO2 27 22 - 32 mmol/L   Glucose, Bld 87 65 - 99 mg/dL   BUN 11 6 - 20 mg/dL   Creatinine, Ser 4.09 0.61 - 1.24 mg/dL   Calcium 9.6 8.9 - 81.1 mg/dL   Total Protein 8.2 (H) 6.5 - 8.1 g/dL   Albumin 4.9 3.5 - 5.0 g/dL   AST 19 15 - 41 U/L   ALT 18 17 -  63 U/L   Alkaline Phosphatase 71 38 - 126 U/L   Total Bilirubin 0.6 0.3 - 1.2 mg/dL   GFR calc non Af Amer >60 >60 mL/min   GFR calc Af Amer >60 >60 mL/min   Anion gap 7 5 - 15  Ethanol  Result Value Ref Range   Alcohol, Ethyl (B) <5 <5 mg/dL  Salicylate level  Result Value Ref Range   Salicylate Lvl <7.0 2.8 - 30.0 mg/dL  Acetaminophen level  Result Value Ref Range   Acetaminophen (Tylenol), Serum <10 (L) 10 - 30 ug/mL  cbc  Result Value Ref Range   WBC 10.6 (H) 4.0 - 10.5 K/uL   RBC 5.34 4.22 - 5.81 MIL/uL   Hemoglobin 15.3 13.0 - 17.0 g/dL   HCT 40.943.9 81.139.0 - 91.452.0 %   MCV 82.2 78.0 - 100.0 fL   MCH 28.7 26.0 - 34.0 pg   MCHC 34.9 30.0 - 36.0 g/dL   RDW 78.213.1 95.611.5 - 21.315.5 %   Platelets 235 150 - 400 K/uL  Rapid urine drug screen (hospital performed)  Result Value Ref Range   Opiates NONE DETECTED NONE DETECTED   Cocaine NONE DETECTED NONE DETECTED   Benzodiazepines NONE DETECTED NONE DETECTED   Amphetamines NONE DETECTED NONE DETECTED   Tetrahydrocannabinol POSITIVE (A) NONE DETECTED   Barbiturates NONE DETECTED NONE DETECTED   No results  found.  Procedures Procedures (including critical care time)  Medications Ordered in ED Medications  alum & mag hydroxide-simeth (MAALOX/MYLANTA) 200-200-20 MG/5ML suspension 30 mL (not administered)  ondansetron (ZOFRAN) tablet 4 mg (not administered)  nicotine (NICODERM CQ - dosed in mg/24 hours) patch 21 mg (not administered)  zolpidem (AMBIEN) tablet 5 mg (not administered)  ibuprofen (ADVIL,MOTRIN) tablet 600 mg (not administered)  acetaminophen (TYLENOL) tablet 650 mg (not administered)  LORazepam (ATIVAN) tablet 1 mg (not administered)     Final Clinical Impressions(s) / ED Diagnoses  Is medically cleared for TTS.  Disposition per them, I believe given homicidality the patient needs inpatient care.Marland Kitchen.      Cy BlamerApril Shailee Foots, MD 06/21/16 [phone]

## 2016-06-21 NOTE — ED Notes (Signed)
Patient reports SI and auditory hallucinations telling him to bang his head against the wall. Patient denies HI and AVH at this time. Plan of care discussed with patient and patient voices no complaints or concerns at this time. Encouragement and support provided. Q 15 min safety checks in place and video monitoring/

## 2016-06-21 NOTE — BH Assessment (Signed)
Tele Assessment Note   Ryan Bartlett is an 20 y.o. male. Pt reports SI with a plan to overdose. Pt reports HI towards his brother. Pt reports 10+ SI attempts. Pt reports "too many" hospitalizations to count. Pt states he was hospitalized a week age at [facility]. Pt states he hears voices telling him to harm himself. Pt states he is on probation for larceny. Pt has an [facility]T team with [facility]. Pt states his ACTT sees him 1x a week. Pt states he is prescribed Depakote, Cogentin, and Risperdal. Pt reports prescription drug and marijuana use. Pt also has a hx of meth use. Pt denies abuse.   Pending disposition.  Diagnosis:  F32.4 Bipolar, depressed, severe  Past Medical History:  Past Medical History:  Diagnosis Date  . ADHD (attention deficit hyperactivity disorder)   . Asthma   . Bipolar disorder (HCC)   . Depression   . Oppositional defiant disorder   . Unspecified episodic mood disorder     Past Surgical History:  Procedure Laterality Date  . NO PAST SURGERIES      Family History:  Family History  Problem Relation Age of Onset  . Depression Mother   . Diabetes Other   . Hyperlipidemia Other   . Hypertension Other     Social History:  reports that he has been smoking Cigarettes.  He has been smoking about 0.50 packs per day for the past 0.00 years. He has never used smokeless tobacco. He reports that he drinks about 3.6 oz of alcohol per week . He reports that he uses drugs, including Marijuana and Methamphetamines.  Additional Social History:  Alcohol / Drug Use Pain Medications: Pt denies  Prescriptions: Depakote, Cogentin, Risperdal Over the Counter: Pt denies History of alcohol / drug use?: Yes Longest period of sobriety (when/how long): unknown Substance #1 Name of Substance 1: Prescription drugs-percocet 1 - Age of First Use: unknown 1 - Amount (size/oz): unknown 1 - Frequency: occasional 1 - Duration: ongoing 1 - Last Use / Amount:  06/20/16 Substance #2 Name of Substance 2: Marijuana 2 - Age of First Use: unknown 2 - Amount (size/oz): "blunt" 2 - Frequency: occasional 2 - Duration: ongoiong 2 - Last Use / Amount: 06/20/16  CIWA: CIWA-Ar BP: 106/66 Pulse Rate: 60 COWS:    PATIENT STRENGTHS: (choose at least two) Average or above average intelligence Communication skills  Allergies:  Allergies  Allergen Reactions  . Peanut-Containing Drug Products Anaphylaxis  . Lactose Intolerance (Gi) Diarrhea and Nausea And Vomiting    Home Medications:  (Not in a hospital admission)  OB/GYN Status:  No LMP for male patient.  General Assessment Data Location of Assessment: [facility] ED TTS Assessment: In system Is this a Tele or Face-to-Face Assessment?: Face-to-Face Is this an Initial Assessment or a Re-assessment for this encounter?: Initial Assessment Marital status: Single Maiden name: NA Is patient pregnant?: No Pregnancy Status: No Living Arrangements: Other relatives Can pt return to current living arrangement?: Yes Admission Status: Voluntary Is patient capable of signing voluntary admission?: Yes Referral Source: Self/Family/Friend Insurance type: Medicaid     Crisis Care Plan Living Arrangements: Other relatives Legal Guardian: Other: (self) Name of Psychiatrist: [facility] Name of Therapist: [facility]  Education Status Is patient currently in school?: No Current Grade: 10 Highest grade of school patient has completed: 7411 Name of school: NA Contact person: NA  Risk to self with the past 6 months Suicidal Ideation: Yes-Currently Present Has patient been a risk to self  within the past 6 months prior to admission? : Yes Suicidal Intent: Yes-Currently Present Has patient had any suicidal intent within the past 6 months prior to admission? : Yes Is patient at risk for suicide?: Yes Suicidal Plan?: Yes-Currently Present Has patient had any suicidal plan within the past 6  months prior to admission? : Yes Specify Current Suicidal Plan: to overdose Access to Means: Yes Specify Access to Suicidal Means: access to pills What has been your use of drugs/alcohol within the last 12 months?: prescription drug overuse and marijuana Previous Attempts/Gestures: Yes How many times?: 10 Other Self Harm Risks: SA Triggers for Past Attempts: Unpredictable Intentional Self Injurious Behavior: None Family Suicide History: No Recent stressful life event(s): Other (Comment) (Pt denies) Persecutory voices/beliefs?: No Depression: Yes Depression Symptoms: Isolating, Loss of interest in usual pleasures, Feeling worthless/self pity, Feeling angry/irritable Substance abuse history and/or treatment for substance abuse?: Yes Suicide prevention information given to non-admitted patients: Not applicable  Risk to Others within the past 6 months Homicidal Ideation: Yes-Currently Present Does patient have any lifetime risk of violence toward others beyond the six months prior to admission? : No Thoughts of Harm to Others: Yes-Currently Present Comment - Thoughts of Harm to Others: to kill brother Current Homicidal Intent: Yes-Currently Present Current Homicidal Plan: No Describe Current Homicidal Plan: Pt denies plan Access to Homicidal Means: No Identified Victim: NA History of harm to others?: No Assessment of Violence: None Noted Violent Behavior Description: NA Does patient have access to weapons?: No Criminal Charges Pending?: No Describe Pending Criminal Charges: NA Does patient have a court date: No Is patient on probation?: No  Psychosis Hallucinations: Auditory, Visual Delusions: None noted  Mental Status Report Appearance/Hygiene: In scrubs, Unremarkable Eye Contact: Good Motor Activity: Freedom of movement Speech: Logical/coherent Level of Consciousness: Alert Mood: Depressed Affect: Depressed Anxiety Level: Moderate Thought Processes: Coherent,  Relevant Judgement: Unimpaired Orientation: Person, Place, Time, Situation Obsessive Compulsive Thoughts/Behaviors: None  Cognitive Functioning Concentration: Normal Memory: Recent Intact, Remote Intact IQ: Average Insight: Fair Impulse Control: Fair Appetite: Fair Weight Loss: 0 Weight Gain: 0 Sleep: Decreased Total Hours of Sleep: 5 Vegetative Symptoms: None  ADLScreening [facility]) Patient's cognitive ability adequate to safely complete daily activities?: Yes Patient able to express need for assistance with ADLs?: Yes Independently performs ADLs?: Yes (appropriate for developmental age)  Prior Inpatient Therapy Prior Inpatient Therapy: Yes Prior Therapy Dates: over several years Prior Therapy [facility]Provider([facility] Reason for Treatment: SI, bipolar d/o  Prior Outpatient Therapy Prior Outpatient Therapy: Yes Prior Therapy Dates: recently Prior Therapy Facilty/Provider(s): [facility] ACTT Reason for Treatment: ACTT - med management Does patient have an ACCT team?: Yes Does patient have Intensive In-House Services?  : No Does patient have [facility] services? : No Does patient have [facility] services?: No  ADL Screening (condition at time of admission) Patient's cognitive ability adequate to safely complete daily activities?: Yes Is the patient deaf or have difficulty hearing?: No Does the patient have difficulty seeing, even when wearing glasses/contacts?: No Does the patient have difficulty concentrating, remembering, or making decisions?: No Patient able to express need for assistance with ADLs?: Yes Does the patient have difficulty dressing or bathing?: No Independently performs ADLs?: Yes (appropriate for developmental age) Does the patient have difficulty walking or climbing stairs?: No Weakness of Legs: None Weakness of Arms/Hands: None       Abuse/Neglect Assessment (Assessment to be  complete while patient is alone) Physical Abuse: Denies Verbal  Abuse: Denies Sexual Abuse: Denies Exploitation of patient/patient's resources: Denies Self-Neglect: Denies     Merchant navy officer (For Healthcare) Does patient have an advance directive?: No Would patient like information on creating an advanced directive?: No - patient declined information    Additional Information 1:1 In Past 12 Months?: Yes CIRT Risk: No Elopement Risk: No Does patient have medical clearance?: Yes     Disposition:  Disposition Initial Assessment Completed for this Encounter: Yes  Teyah Rossy D 06/21/2016 8:49 AM

## 2016-06-21 NOTE — BH Assessment (Signed)
1st TTS attempt. Pt sleeping and unable to arouse. TTS will attempt to assess pt at later time. Pt RN (Rashell) informed.

## 2016-06-21 NOTE — ED Notes (Signed)
Patient discharged to home.  Denies thoughts of harm to self or others.  All belongings returned and signed for.  Patient left the unit ambulatory and was escorted to the front lobby where his ACTT team was waiting to give him a ride home.  He will follow up with them.

## 2016-06-21 NOTE — Progress Notes (Signed)
Patient is negative for opiates despite patient stating he was abusing them.  No suicidal/homicidal ideations, hallucinations, or withdrawal symptoms.  His ACT team was notified, [facility] and let us know he has a court date tomorrow for larceny.  No safety concerns.  Discharged to his ACT team.  Ryan Bartlett, PMH-NP 06/21/2016

## 2016-06-21 NOTE — Discharge Instructions (Signed)
For your ongoing behavioral health needs, you are advised to continue treatment with the [facility] ACT Team: ° °     [facility] °     [address] °     [place], [place] [address] °     [phone] °

## 2016-08-08 ENCOUNTER — Emergency Department (HOSPITAL_COMMUNITY)
Admission: EM | Admit: 2016-08-08 | Discharge: 2016-08-08 | Disposition: A | Discharge status: Home / Self Care | Payer: Medicaid Other | Attending: Emergency Medicine | Admitting: Emergency Medicine

## 2016-08-08 ENCOUNTER — Emergency Department (HOSPITAL_COMMUNITY): Payer: Medicaid Other

## 2016-08-08 ENCOUNTER — Inpatient Hospital Stay (HOSPITAL_COMMUNITY)
Admission: RE | Admit: 2016-08-08 | Discharge: 2016-08-16 | DRG: 885 | Disposition: A | Discharge status: Home / Self Care | Payer: Medicaid Other | Attending: Psychiatry | Admitting: Psychiatry

## 2016-08-08 ENCOUNTER — Encounter (HOSPITAL_COMMUNITY): Payer: Self-pay | Admitting: Emergency Medicine

## 2016-08-08 ENCOUNTER — Encounter (HOSPITAL_COMMUNITY): Payer: Self-pay

## 2016-08-08 DIAGNOSIS — W0110XA Fall on same level from slipping, tripping and stumbling with subsequent striking against unspecified object, initial encounter: Secondary | ICD-10-CM | POA: Diagnosis not present

## 2016-08-08 DIAGNOSIS — F411 Generalized anxiety disorder: Secondary | ICD-10-CM | POA: Diagnosis present

## 2016-08-08 DIAGNOSIS — F17210 Nicotine dependence, cigarettes, uncomplicated: Secondary | ICD-10-CM | POA: Diagnosis not present

## 2016-08-08 DIAGNOSIS — Z91010 Allergy to peanuts: Secondary | ICD-10-CM | POA: Diagnosis not present

## 2016-08-08 DIAGNOSIS — G4700 Insomnia, unspecified: Secondary | ICD-10-CM | POA: Diagnosis present

## 2016-08-08 DIAGNOSIS — Y999 Unspecified external cause status: Secondary | ICD-10-CM | POA: Insufficient documentation

## 2016-08-08 DIAGNOSIS — F3181 Bipolar II disorder: Principal | ICD-10-CM | POA: Diagnosis present

## 2016-08-08 DIAGNOSIS — J069 Acute upper respiratory infection, unspecified: Secondary | ICD-10-CM | POA: Diagnosis not present

## 2016-08-08 DIAGNOSIS — Y929 Unspecified place or not applicable: Secondary | ICD-10-CM | POA: Diagnosis not present

## 2016-08-08 DIAGNOSIS — M25561 Pain in right knee: Secondary | ICD-10-CM | POA: Insufficient documentation

## 2016-08-08 DIAGNOSIS — Z79899 Other long term (current) drug therapy: Secondary | ICD-10-CM | POA: Diagnosis not present

## 2016-08-08 DIAGNOSIS — F209 Schizophrenia, unspecified: Secondary | ICD-10-CM | POA: Diagnosis present

## 2016-08-08 DIAGNOSIS — F913 Oppositional defiant disorder: Secondary | ICD-10-CM | POA: Diagnosis present

## 2016-08-08 DIAGNOSIS — Y939 Activity, unspecified: Secondary | ICD-10-CM | POA: Diagnosis not present

## 2016-08-08 DIAGNOSIS — F909 Attention-deficit hyperactivity disorder, unspecified type: Secondary | ICD-10-CM | POA: Insufficient documentation

## 2016-08-08 DIAGNOSIS — E739 Lactose intolerance, unspecified: Secondary | ICD-10-CM | POA: Diagnosis present

## 2016-08-08 DIAGNOSIS — R45851 Suicidal ideations: Secondary | ICD-10-CM | POA: Diagnosis present

## 2016-08-08 DIAGNOSIS — Z818 Family history of other mental and behavioral disorders: Secondary | ICD-10-CM

## 2016-08-08 DIAGNOSIS — Z833 Family history of diabetes mellitus: Secondary | ICD-10-CM | POA: Diagnosis not present

## 2016-08-08 DIAGNOSIS — J45909 Unspecified asthma, uncomplicated: Secondary | ICD-10-CM | POA: Diagnosis not present

## 2016-08-08 DIAGNOSIS — Z8249 Family history of ischemic heart disease and other diseases of the circulatory system: Secondary | ICD-10-CM | POA: Diagnosis not present

## 2016-08-08 DIAGNOSIS — F902 Attention-deficit hyperactivity disorder, combined type: Secondary | ICD-10-CM | POA: Diagnosis present

## 2016-08-08 HISTORY — DX: Unspecified osteoarthritis, unspecified site: M19.90

## 2016-08-08 MED ORDER — QUETIAPINE FUMARATE 100 MG PO TABS
100.0000 mg | ORAL_TABLET | Freq: Every evening | ORAL | Status: DC | PRN
  Administered 2016-08-09 – 2016-08-11 (×4): 100 mg via ORAL
  Filled 2016-08-08 (×10): qty 1

## 2016-08-08 MED ORDER — MAGNESIUM HYDROXIDE 400 MG/5ML PO SUSP: 30.0000 mL | Freq: Every day | ORAL | Status: DC | PRN

## 2016-08-08 MED ORDER — IBUPROFEN 600 MG PO TABS
600.0000 mg | ORAL_TABLET | Freq: Four times a day (QID) | ORAL | Status: DC | PRN
  Administered 2016-08-10 – 2016-08-13 (×3): 600 mg via ORAL
  Filled 2016-08-08 (×3): qty 1

## 2016-08-08 MED ORDER — ACETAMINOPHEN 325 MG PO TABS
650.0000 mg | ORAL_TABLET | Freq: Four times a day (QID) | ORAL | Status: DC | PRN
Start: 2016-08-08 — End: 2016-08-16
  Administered 2016-08-08 – 2016-08-10 (×3): 650 mg via ORAL
  Filled 2016-08-08 (×3): qty 2

## 2016-08-08 MED ORDER — BENZTROPINE MESYLATE 1 MG PO TABS
1.0000 mg | ORAL_TABLET | Freq: Every day | ORAL | Status: DC
  Administered 2016-08-09: 1 mg via ORAL
  Filled 2016-08-08 (×3): qty 1

## 2016-08-08 MED ORDER — OXYMETAZOLINE HCL 0.05 % NA SOLN: 1.0000 | Freq: Two times a day (BID) | NASAL | 0 refills | Status: DC

## 2016-08-08 MED ORDER — IBUPROFEN 600 MG PO TABS: 600.0000 mg | ORAL_TABLET | Freq: Four times a day (QID) | ORAL | 0 refills | Status: DC | PRN

## 2016-08-08 MED ORDER — ALUM & MAG HYDROXIDE-SIMETH 200-200-20 MG/5ML PO SUSP: 30.0000 mL | ORAL | Status: DC | PRN

## 2016-08-08 MED ORDER — DIVALPROEX SODIUM ER 500 MG PO TB24
500.0000 mg | ORAL_TABLET | Freq: Every day | ORAL | Status: DC
  Administered 2016-08-09 – 2016-08-10 (×2): 500 mg via ORAL
  Filled 2016-08-08 (×4): qty 1

## 2016-08-08 MED ORDER — BENZONATATE 100 MG PO CAPS: 200.0000 mg | ORAL_CAPSULE | Freq: Two times a day (BID) | ORAL | 0 refills | Status: DC | PRN

## 2016-08-08 MED ORDER — ASENAPINE MALEATE 5 MG SL SUBL
5.0000 mg | SUBLINGUAL_TABLET | Freq: Two times a day (BID) | SUBLINGUAL | Status: DC
  Administered 2016-08-09: 5 mg via SUBLINGUAL
  Filled 2016-08-08 (×5): qty 1

## 2016-08-08 MED ORDER — HYDROXYZINE HCL 25 MG PO TABS
25.0000 mg | ORAL_TABLET | Freq: Four times a day (QID) | ORAL | Status: DC | PRN
  Administered 2016-08-08 – 2016-08-15 (×4): 25 mg via ORAL
  Filled 2016-08-08 (×3): qty 1

## 2016-08-08 MED ORDER — OXYMETAZOLINE HCL 0.05 % NA SOLN: 1.0000 | Freq: Two times a day (BID) | NASAL | Status: DC

## 2016-08-08 NOTE — Tx Team (Signed)
Initial Treatment Plan 08/08/2016 11:45 PM Ryan Bartlett [id number]    PATIENT STRESSORS: Financial difficulties Legal issue Medication change or noncompliance Substance abuse   PATIENT STRENGTHS: Capable of independent living Wellsite geologistCommunication skills General fund of knowledge Motivation for treatment/growth Physical Health   PATIENT IDENTIFIED PROBLEMS: Depression  Anxiety  Suicidal ideation  History of substance abuse (clean from alcohol 1 month)  "I would like to work on my temper"  "I would like to work on my sleep"           DISCHARGE CRITERIA:  Improved stabilization in mood, thinking, and/or behavior Verbal commitment to aftercare and medication compliance  PRELIMINARY DISCHARGE PLAN: Outpatient therapy Medication management  PATIENT/FAMILY INVOLVEMENT: This treatment plan has been presented to and reviewed with the patient, Ryan Bartlett.  The patient and family have been given the opportunity to ask questions and make suggestions.  Levin BaconHeather V Andersen Mckiver, RN 08/08/2016, 11:45 PM

## 2016-08-08 NOTE — Progress Notes (Signed)
Ryan Bartlett is a 20 year old male being admitted voluntarily to [facility].  He came in with suicidal ideation with plan to hang self.  He also states that he has been having "weird dreams of this dude killing me."  He denies HI or A/V hallucinations.  He continues to voice suicidal ideation but is able to contract for safety on the unit.  He reported that he has had a cough/cold for "awhile" and went to the ED today for treatment.  VSS.  He denies any pain at this time.  Oriented him to the unit.  Admission paperwork completed and signed.  Belongings searched and secured in locker # 44.  Skin assessment completed and no skin issues noted other than tattoo on right wrist and forearm.  Q 15 minute checks initiated for safety.  We will monitor the progress towards his goals.

## 2016-08-08 NOTE — H&P (Signed)
Behavioral Health Medical Screening Exam  Ryan Bartlett is an 20 y.o. male, [facility] concerns with nightmares/night terrors and AVH. He is also endorsing SI.  Total Time spent with patient: 15 minutes  Psychiatric Specialty Exam: Physical Exam  Constitutional: He is oriented to person, place, and time. He appears well-developed and well-nourished.  HENT:  Head: Normocephalic.  Eyes: Pupils are equal, round, and reactive to light.  Neurological: He is alert and oriented to person, place, and time.  Skin: Skin is warm.  Psychiatric: His speech is normal and behavior is normal. Judgment normal. His mood appears anxious. Thought content is delusional. Cognition and memory are normal. He exhibits a depressed mood. He expresses suicidal ideation.    Review of Systems  Psychiatric/Behavioral: Positive for depression, hallucinations and suicidal ideas. The patient is nervous/anxious and has insomnia.   All other systems reviewed and are negative.   There were no vitals taken for this visit.There is no height or weight on file to calculate BMI.  General Appearance: Disheveled  Eye Contact:  Good  Speech:  Clear and Coherent  Volume:  Normal  Mood:  Anxious and Depressed  Affect:  Congruent  Thought Process:  Disorganized  Orientation:  Full (Time, Place, and Person)  Thought Content:  Hallucinations: Auditory  Suicidal Thoughts:  Yes.  without intent/plan  Homicidal Thoughts:  No  Memory:  Immediate;   Fair  Judgement:  Poor  Insight:  Lacking  Psychomotor Activity:  Negative  Concentration: Concentration: Fair  Recall:  FiservFair  Fund of Knowledge:Fair  Language: Good  Akathisia:  Negative  Handed:  Right  AIMS (if indicated):     Assets:  Desire for Improvement  Sleep:       Musculoskeletal: Strength & Muscle Tone: within normal limits Gait & Station: normal Patient leans: N/A  There were no vitals taken for this visit.  Recommendations:  Based on my  evaluation the patient does not appear to have an emergency medical condition.  SIMON,SPENCER E, PA-C 08/08/2016, 10:40 PM

## 2016-08-08 NOTE — ED Triage Notes (Signed)
Per EMS, patient tripped and fell and hit his right knee. Patient complaining of right knee pain. Patient ambulatory and able to bear weight.

## 2016-08-08 NOTE — Discharge Instructions (Signed)
Take your medications as prescribed. I recommend resting, elevating applying ice to your right knee for 15 minutes 3-4 to help with your pain. Continue drinking fluids at home to remain hydrated. I recommend following up with her primary care provider's office listed below if your URI symptoms have not improved over the next week. I recommend following up with the orthopedic office below if your knee pain has not improved or has worsened over the next week. Please return to the Emergency Department if symptoms worsen or new onset of fever, redness, swelling, warmth, numbness, tingling, weakness, coughing up blood, difficulty breathing, chest pain, vomiting, unable to keep fluids down.
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
## 2016-08-09 LAB — CBC
HCT: 43 % (ref 39.0–52.0)
Hemoglobin: 14.5 g/dL (ref 13.0–17.0)
MCH: 28 pg (ref 26.0–34.0)
MCHC: 33.7 g/dL (ref 30.0–36.0)
MCV: 83.2 fL (ref 78.0–100.0)
PLATELETS: 208 10*3/uL (ref 150–400)
RBC: 5.17 MIL/uL (ref 4.22–5.81)
RDW: 13.7 % (ref 11.5–15.5)
WBC: 10.6 10*3/uL — ABNORMAL HIGH (ref 4.0–10.5)

## 2016-08-09 LAB — HEPATIC FUNCTION PANEL
ALBUMIN: 4.4 g/dL (ref 3.5–5.0)
ALT: 18 U/L (ref 17–63)
AST: 27 U/L (ref 15–41)
Alkaline Phosphatase: 60 U/L (ref 38–126)
TOTAL PROTEIN: 7.5 g/dL (ref 6.5–8.1)
Total Bilirubin: 0.8 mg/dL (ref 0.3–1.2)

## 2016-08-09 LAB — BASIC METABOLIC PANEL
Anion gap: 8 (ref 5–15)
BUN: 15 mg/dL (ref 6–20)
CHLORIDE: 106 mmol/L (ref 101–111)
CO2: 24 mmol/L (ref 22–32)
CREATININE: 0.67 mg/dL (ref 0.61–1.24)
Calcium: 9.3 mg/dL (ref 8.9–10.3)
GFR calc Af Amer: >60 mL/min (ref >60)
GFR calc non Af Amer: >60 mL/min (ref >60)
GLUCOSE: 99 mg/dL (ref 65–99)
POTASSIUM: 3.6 mmol/L (ref 3.5–5.1)
Sodium: 138 mmol/L (ref 135–145)

## 2016-08-09 LAB — LIPID PANEL
Cholesterol: 203 mg/dL — ABNORMAL HIGH (ref 0–200)
HDL: 47 mg/dL (ref >40)
LDL CALC: 133 mg/dL — AB (ref 0–99)
Total CHOL/HDL Ratio: 4.3 RATIO
Triglycerides: 116 mg/dL (ref <150)
VLDL: 23 mg/dL (ref 0–40)

## 2016-08-09 LAB — TSH: TSH: 2.09 u[IU]/mL (ref 0.350–4.500)

## 2016-08-09 MED ORDER — QUETIAPINE FUMARATE 50 MG PO TABS: 50.0000 mg | ORAL_TABLET | Freq: Three times a day (TID) | ORAL | Status: DC | PRN

## 2016-08-09 MED ORDER — ALBUTEROL SULFATE HFA 108 (90 BASE) MCG/ACT IN AERS
2.0000 | INHALATION_SPRAY | Freq: Four times a day (QID) | RESPIRATORY_TRACT | Status: DC | PRN
  Administered 2016-08-09 – 2016-08-11 (×2): 2 via RESPIRATORY_TRACT
  Filled 2016-08-09: qty 6.7

## 2016-08-09 MED ORDER — NICOTINE POLACRILEX 2 MG MT GUM
2.0000 mg | CHEWING_GUM | OROMUCOSAL | Status: DC | PRN
  Administered 2016-08-09 – 2016-08-16 (×11): 2 mg via ORAL
  Filled 2016-08-09 (×3): qty 1
  Filled 2016-08-09: qty 6

## 2016-08-09 MED ORDER — BENZONATATE 100 MG PO CAPS
200.0000 mg | ORAL_CAPSULE | Freq: Three times a day (TID) | ORAL | Status: DC | PRN
  Administered 2016-08-09 – 2016-08-11 (×4): 200 mg via ORAL
  Filled 2016-08-09 (×5): qty 2

## 2016-08-09 MED ORDER — INFLUENZA VAC SPLIT QUAD 0.5 ML IM SUSY
0.5000 mL | PREFILLED_SYRINGE | INTRAMUSCULAR | Status: AC
  Administered 2016-08-10: 0.5 mL via INTRAMUSCULAR
  Filled 2016-08-09: qty 0.5

## 2016-08-09 NOTE — [facility] Group Notes (Signed)
The focus of this group is to educate the patient on the purpose and policies of crisis stabilization and provide a format to answer questions about their admission.  The group details unit policies and expectations of patients while admitted.  Patient did not attend 0900 nurse education orientation group this morning.  Patient stayed in bed.   

## 2016-08-09 NOTE — Plan of Care (Signed)
Problem: Education: Goal: Utilization of techniques to improve thought processes will improve Outcome: Progressing Nurse discussed depression/coping skills with patient.    

## 2016-08-09 NOTE — Tx Team (Signed)
Interdisciplinary Treatment and Diagnostic Plan Update  08/09/2016 Time of Session: 9:30AM Ryan Bartlett MRN: [id number]  Principal Diagnosis: Bipolar 2 disorder [facility])  Secondary Diagnoses: Principal Problem:   Bipolar 2 disorder (HCC)   Current Medications:  Current Facility-Administered Medications  Medication Dose Route Frequency Provider Last Rate Last Dose  . acetaminophen (TYLENOL) tablet 650 mg  650 mg Oral Q6H PRN Kerry Hough, PA-C   650 mg at 08/09/16 9147  . albuterol (PROVENTIL HFA;VENTOLIN HFA) 108 (90 Base) MCG/ACT inhaler 2 puff  2 puff Inhalation Q6H PRN Kerry Hough, PA-C   2 puff at 08/09/16 [phone]  . alum & mag hydroxide-simeth (MAALOX/MYLANTA) 200-200-20 MG/5ML suspension 30 mL  30 mL Oral Q4H PRN Kerry Hough, PA-C      . benzonatate (TESSALON) capsule 200 mg  200 mg Oral TID PRN Kerry Hough, PA-C   200 mg at 08/09/16 6213  . divalproex (DEPAKOTE ER) 24 hr tablet 500 mg  500 mg Oral Daily Kerry Hough, PA-C   500 mg at 08/09/16 0865  . hydrOXYzine (ATARAX/VISTARIL) tablet 25 mg  25 mg Oral Q6H PRN Kerry Hough, PA-C   25 mg at 08/08/16 2342  . ibuprofen (ADVIL,MOTRIN) tablet 600 mg  600 mg Oral Q6H PRN Kerry Hough, PA-C      . [START ON 08/10/2016] Influenza vac split quadrivalent PF (FLUARIX) injection 0.5 mL  0.5 mL Intramuscular Tomorrow-1000 Fernando A Cobos, MD      . magnesium hydroxide (MILK OF MAGNESIA) suspension 30 mL  30 mL Oral Daily PRN Kerry Hough, PA-C      . nicotine polacrilex (NICORETTE) gum 2 mg  2 mg Oral PRN Rockey Situ Cobos, MD      . QUEtiapine (SEROQUEL) tablet 100 mg  100 mg Oral QHS,MR X 1 Spencer E Simon, PA-C      . QUEtiapine (SEROQUEL) tablet 50 mg  50 mg Oral Q8H PRN Neysa Hotter, MD       PTA Medications: Prescriptions Prior to Admission  Medication Sig Dispense Refill Last Dose  . albuterol (PROVENTIL HFA;VENTOLIN HFA) 108 (90 Base) MCG/ACT inhaler Inhale 2 puffs into the lungs every 6 (six) hours as  needed for wheezing or shortness of breath.   Past Month at Unknown time  . benzonatate (TESSALON) 100 MG capsule Take 2 capsules (200 mg total) by mouth 2 (two) times daily as needed for cough. 20 capsule 0   . benztropine (COGENTIN) 1 MG tablet Take 1 mg by mouth 2 (two) times daily.   06/19/2016 at Unknown time  . divalproex (DEPAKOTE ER) 500 MG 24 hr tablet Take 500 mg by mouth 2 (two) times daily.   06/19/2016 at Unknown time  . ibuprofen (ADVIL,MOTRIN) 600 MG tablet Take 1 tablet (600 mg total) by mouth every 6 (six) hours as needed. 30 tablet 0   . oxymetazoline (AFRIN NASAL SPRAY) 0.05 % nasal spray Place 1 spray into both nostrils 2 (two) times daily. Spray once into each nostril twice daily for up to the next 3 days. Do not use for more than 3 days to prevent rebound rhinorrhea. 30 mL 0   . QUEtiapine (SEROQUEL) 100 MG tablet Take 1 tablet (100 mg total) by mouth at bedtime. (Patient not taking: Reported on 06/20/2016) 30 tablet 0 Not Taking at Unknown time  . risperiDONE (RISPERDAL) 3 MG tablet Take 3 mg by mouth at bedtime.   06/19/2016 at Unknown time    Patient Stressors: Surveyor, quantity  difficulties Legal issue Medication change or noncompliance Substance abuse  Patient Strengths: Capable of independent living Communication skills General fund of knowledge Motivation for treatment/growth Physical Health  Treatment Modalities: Medication Management, Group therapy, Case management,  1 to 1 session with clinician, Psychoeducation, Recreational therapy.   Physician Treatment Plan for Primary Diagnosis: Bipolar 2 disorder (HCC) Long Term Goal(s): Improvement in symptoms so as ready for discharge Improvement in symptoms so as ready for discharge   Short Term Goals: Ability to demonstrate self-control will improve Ability to identify and develop effective coping behaviors will improve Ability to demonstrate self-control will improve Ability to identify and develop effective coping  behaviors will improve  Medication Management: Evaluate patient's response, side effects, and tolerance of medication regimen.  Therapeutic Interventions: 1 to 1 sessions, Unit Group sessions and Medication administration.  Evaluation of Outcomes: Progressing  Physician Treatment Plan for Secondary Diagnosis: Principal Problem:   Bipolar 2 disorder (HCC)  Long Term Goal(s): Improvement in symptoms so as ready for discharge Improvement in symptoms so as ready for discharge   Short Term Goals: Ability to demonstrate self-control will improve Ability to identify and develop effective coping behaviors will improve Ability to demonstrate self-control will improve Ability to identify and develop effective coping behaviors will improve     Medication Management: Evaluate patient's response, side effects, and tolerance of medication regimen.  Therapeutic Interventions: 1 to 1 sessions, Unit Group sessions and Medication administration.  Evaluation of Outcomes: Progressing   RN Treatment Plan for Primary Diagnosis: Bipolar 2 disorder (HCC) Long Term Goal(s): Knowledge of disease and therapeutic regimen to maintain health will improve  Short Term Goals: Ability to remain free from injury will improve  Medication Management: RN will administer medications as ordered by provider, will assess and evaluate patient's response and provide education to patient for prescribed medication. RN will report any adverse and/or side effects to prescribing provider.  Therapeutic Interventions: 1 on 1 counseling sessions, Psychoeducation, Medication administration, Evaluate responses to treatment, Monitor vital signs and CBGs as ordered, Perform/monitor CIWA, COWS, AIMS and Fall Risk screenings as ordered, Perform wound care treatments as ordered.  Evaluation of Outcomes: Progressing   [name] Treatment Plan for Primary Diagnosis: Bipolar 2 disorder (HCC) Long Term Goal(s): Safe transition to appropriate  next level of care at discharge, Engage patient in therapeutic group addressing interpersonal concerns.  Short Term Goals: Engage patient in aftercare planning with referrals and resources, Facilitate patient progression through stages of change regarding substance use diagnoses and concerns and Identify triggers associated with mental health/substance abuse issues  Therapeutic Interventions: Assess for all discharge needs, 1 to 1 time with Social worker, Explore available resources and support systems, Assess for adequacy in community support network, Educate family and significant other(s) on suicide prevention, Complete Psychosocial Assessment, Interpersonal group therapy.  Evaluation of Outcomes: Progressing   Progress in Treatment: Attending groups: No. New to unit. Continuing to assess.  Participating in groups: No. Taking medication as prescribed: Yes. Toleration medication: Yes. Family/Significant other contact made: No, will contact:  family member if patient consents Patient understands diagnosis: Yes. Discussing patient identified problems/goals with staff: Yes. Medical problems stabilized or resolved: Yes. Denies suicidal/homicidal ideation: No. Passive SI/Able to contract for safety.  Issues/concerns per patient self-inventory: No. Other: n/a   New problem(s) identified: No, Describe:  n/a  New Short Term/Long Term Goal(s): mood stabilization/medication management/elimination of SI thoughts/development of comprehensive mental wellness/sobriety plan.   Discharge Plan or Barriers: CSW assessing for appropriate referrals. Pt was last  set up with [facility] for followup in Jan 2017. [place] roommate.   Reason for Continuation of Hospitalization: Depression Medication stabilization Suicidal ideation  Estimated Length of Stay: 3-5 days   Attendees: Patient: 08/09/2016 1:16 PM  Physician: Dr. Jama Flavorsobos; Dr. Donato HeinzHeina MD  08/09/2016 1:16 PM  Nursing: Peterson AoJan, Beverly RN 08/09/2016 1:16 PM  RN Care Manager: Onnie BoerJennifer Clark CM 08/09/2016 1:16 PM  Social Worker: Herbert SetaHeather Smart, [name]; RossfordKristin Drinkard [name] 08/09/2016 1:16 PM  Recreational Therapist:  08/09/2016 1:16 PM  Other: Armandina StammerAgnes Nwoko NP; Claudette Headonrad Withrow NP 08/09/2016 1:16 PM  Other:  08/09/2016 1:16 PM  Other: 08/09/2016 1:16 PM    Scribe for Treatment Team: Ledell PeoplesHeather N Smart, [name] 08/09/2016 1:16 PM

## 2016-08-09 NOTE — Progress Notes (Signed)
Patient ID: Ryan Bartlett, male   DOB: [birth year], 20 y.o.   MRN: [id number] D: Client is visible on unit, seen in dayroom and at nurses station, interacts with peers and staff appropriately. Client reports goal today "to interact more" client felt this goal was partially met because "I was in the bed most of the day" A: Writer provided emotional support encouraged group. Writer also encouraged fluid to to help loosen congestion. Medications reviewed, administered as ordered. R: Client is safe on the unit.

## 2016-08-09 NOTE — Progress Notes (Signed)
Patient ID: Ryan Bartlett, male   DOB: April 19, 1996, 20 y.o.   MRN: [id number]   Report accepted from admitting nurse Herbert SetaHeather, RN. Pt currently presents with a flat affect and depressed behavior. Pt supported emotionally and encouraged to express concerns and questions. Pt's safety ensured with 15 minute and environmental checks. Pt currently denies SI/HI and A/V hallucinations. Pt verbally agrees to seek staff if SI/HI or A/VH occurs and to consult with staff before acting on any harmful thoughts. Pt currently lying in bed. Complains of a sore throat and nagging cough, see MAR. Will continue POC.
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
## 2016-08-09 NOTE — [facility] Group Notes (Signed)
[facility] [name] Group Therapy  08/09/2016 3:43 PM  Type of Therapy:  Group Therapy  Participation Level:  Did Not Attend-pt invited. Chose to remain in bed.   Summary of Progress/Problems: Today's Topic: Overcoming Obstacles. Patients identified one short term goal and potential obstacles in reaching this goal. Patients processed barriers involved in overcoming these obstacles. Patients identified steps necessary for overcoming these obstacles and explored motivation (internal and external) for facing these difficulties head on.   Ledell PeoplesHeather N Smart [name] 08/09/2016, 3:43 PM

## 2016-08-09 NOTE — Progress Notes (Addendum)
D:  Patient stated he does have SI thoughts, no plan, contracts for safety.  Denied HI.  Denied A/V hallucinations.   A:  Medications administered per MD orders.  Emotional support and encouragement given patient. R:  Safety maintained with 15 minute checks. Patient's self inventory sheet, patient sleeps good, sleep medication helpful.  Fair appetite, low energy level, good concentration.  Rated depression and hopeless 9, anxiety 8.  Denied withdrawals.  Has experienced runny nose.  SI, contracts for safety, no plan.  Denied physical problems.  Denied physical pain.  Goal is to interact with others.  Plans to stay out of bed.  No discharge plans.

## 2016-08-09 NOTE — H&P (Addendum)
Psychiatric Admission Assessment Adult  Patient Identification: Ryan Bartlett MRN:  [id number] Date of Evaluation:  08/09/2016 Chief Complaint:  BIPOLAR Principal Diagnosis: Bipolar 2 disorder ([place]) Diagnosis:   Patient Active Problem List   Diagnosis Date Noted  . Bipolar 2 disorder ([place]) [F31.81] 08/08/2016  . 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 Bipolar affective disorder, current episode depressed ([place]) [F31.30] 03/08/2016  . Bipolar affect, depressed ([place]) [F31.30] 09/18/2015  . Bipolar affective disorder, currently depressed, mild ([place]) [F31.31] 09/17/2015  . Intentional overdose of drug in tablet form ([place]) [T50.902A]   . Major depressive disorder, recurrent episode, moderate ([place]) [F33.1] 07/09/2015  . Depression [F32.9]   . Suicidal ideation [R45.851]   . Cannabis use disorder, moderate, dependence ([place]) [F12.20] 06/06/2015  . Alcohol use disorder, moderate, dependence ([place]) [F10.20] 06/06/2015  . Bipolar I disorder, most recent episode depressed ([place]) [F31.30] 05/23/2013  . Cannabis abuse [F12.10] 03/30/2012  . ADHD (attention deficit hyperactivity disorder), combined type [F90.2] 08/28/2011  . Conduct disorder, adolescent onset type [F91.2] 08/28/2011   History of Present Illness:  Ryan Bartlett is a 20 year old male, with schizophrenia, bipolar disorder per report,  being admitted voluntarily as a walk in to [facility].  He came in with suicidal ideation with plan to hang self.   Patient states that he has been feeling depressed lately. He started to have nightmares of being stabbed by somebody every day. He denies any trauma related to his nightmares. He is also concerned as he was evicted from his house and currently lives with his friends. He uses marijuana every week, and used to use cocaine, meth, last 3 months ago.   He reports insomnia due to nightmares. He has anhedonia. He denies decreased need for sleep/euphoria, although he later states that he has some "mood  swings," which he is unable to elaborate it. He denies AH/VH. He denies paranoia.   Utox positive for THC on 06/20/2016  Associated Signs/Symptoms: Depression Symptoms:  depressed mood, anhedonia, insomnia, hopelessness, anxiety, (Hypo) Manic Symptoms:  deneis Anxiety Symptoms:  Excessive Worry, Panic Symptoms, Psychotic Symptoms:  denies PTSD Symptoms: NA Total Time spent with patient: 45 minutes  Past Psychiatric History:  Outpatient: Dx: bipolar, schizoaffective, seen last month, [facility] per chart Psychiatry admission: in [place] 05/2016, overdosing medication Previous suicide attempt: "a lot", last in 08/2015 by overdosing medication,other attempts including trying to hang himself in jail Past trials of medication: Depakote, risperidone, saphris, cogentin History of violence:  Legal: was in jail in 2016, pending charges for breakin and entry  Is the patient at risk to self? No.  Has the patient been a risk to self in the past 6 months? No.  Has the patient been a risk to self within the distant past? No.  Is the patient a risk to others? No.  Has the patient been a risk to others in the past 6 months? No.  Has the patient been a risk to others within the distant past? No.   Prior Inpatient Therapy: Prior Inpatient Therapy: Yes Prior Therapy Dates: Multiple admissions Prior Therapy Facilty/Provider(s): [facility]; [facility] Reason for Treatment: SI, SA Prior Outpatient Therapy: Prior Outpatient Therapy: Yes Prior Therapy Dates: Past year Prior Therapy Facilty/Provider(s): Dr. Jake Samples in [facility] Reason for Treatment: med management Does patient have an ACCT team?: No Does patient have Intensive In-House Services?  : No Does patient have [facility] services? : No Does patient have P4CC services?: No  Alcohol Screening: 1. How often do you have a  drink containing alcohol?: Monthly or less 2. How many drinks containing alcohol do you have on a typical day when you  are drinking?: 1 or 2 3. How often do you have six or more drinks on one occasion?: Never Preliminary Score: 0 9. Have you or someone else been injured as a result of your drinking?: No 10. Has a relative or friend or a doctor or another health worker been concerned about your drinking or suggested you cut down?: Yes, but not in the last year Alcohol Use Disorder Identification Test Final Score (AUDIT): 3 Brief Intervention: AUDIT score less than 7 or less-screening does not suggest unhealthy drinking-brief intervention not indicated Substance Abuse History in the last 12 months:  Yes.   Consequences of Substance Abuse: worsening depression Previous Psychotropic Medications: Yes  Psychological Evaluations: No  Past Medical History:  Past Medical History:  Diagnosis Date  . ADHD (attention deficit hyperactivity disorder)   . Arthritis   . Asthma   . Bipolar disorder ([place])   . Depression   . Oppositional defiant disorder   . Unspecified episodic mood disorder     Past Surgical History:  Procedure Laterality Date  . NO PAST SURGERIES     Family History:  Family History  Problem Relation Age of Onset  . Depression Mother   . Diabetes Other   . Hyperlipidemia Other   . Hypertension Other    Family Psychiatric  History: Mother depression father is alcoholic Social History:  Tobacco Screening: Have you used any form of tobacco in the last 30 days? (Cigarettes, Smokeless Tobacco, Cigars, and/or Pipes): Yes Tobacco use, Select all that apply: 5 or more cigarettes per day Are you interested in Tobacco Cessation Medications?: Yes, will notify MD for an order Counseled patient on smoking cessation including recognizing danger situations, developing coping skills and basic information about quitting provided: Refused/Declined practical counseling Social History:  History  Alcohol Use  . 3.6 oz/week  . 6 Cans of beer per week    Comment: "I actually recently quit"     History  Drug  Use  . Types: Marijuana, Methamphetamines    Additional Social History: Marital status: Single     Lives with his friends,  Employment: work as an Environmental consultant for a month Education: 11th grade  Pain Medications: None Prescriptions: Has not taken in 2 weeks. Over the Counter: None History of alcohol / drug use?: Yes Longest period of sobriety (when/how long): unknown Negative Consequences of Use: Financial, Legal Name of Substance 1: Marijuana 1 - Age of First Use: Teens 1 - Amount (size/oz): 2 blunts per episode 1 - Frequency: Once per week on average 1 - Duration: on-going 1 - Last Use / Amount: Two days ago                  Allergies:   Allergies  Allergen Reactions  . Peanut-Containing Drug Products Anaphylaxis  . Lactose Intolerance (Gi) Diarrhea and Nausea And Vomiting   Lab Results:  Results for orders placed or performed during the hospital encounter of 08/08/16 (from the past 48 hour(s))  Basic metabolic panel     Status: None   Collection Time: 08/09/16  6:32 AM  Result Value Ref Range   Sodium 138 135 - 145 mmol/L   Potassium 3.6 3.5 - 5.1 mmol/L   Chloride 106 101 - 111 mmol/L   CO2 24 22 - 32 mmol/L   Glucose, Bld 99 65 - 99 mg/dL   BUN 15 6 -  20 mg/dL   Creatinine, Ser 0.67 0.61 - 1.24 mg/dL   Calcium 9.3 8.9 - 10.3 mg/dL   GFR calc non Af Amer >60 >60 mL/min   GFR calc Af Amer >60 >60 mL/min    Comment: (NOTE) The eGFR has been calculated using the CKD EPI equation. This calculation has not been validated in all clinical situations. eGFR's persistently <60 mL/min signify possible Chronic Kidney Disease.    Anion gap 8 5 - 15    Comment: Performed at [facility]  CBC     Status: Abnormal   Collection Time: 08/09/16  6:32 AM  Result Value Ref Range   WBC 10.6 (H) 4.0 - 10.5 K/uL   RBC 5.17 4.22 - 5.81 MIL/uL   Hemoglobin 14.5 13.0 - 17.0 g/dL   HCT 43.0 39.0 - 52.0 %   MCV 83.2 78.0 - 100.0 fL   MCH 28.0 26.0 - 34.0 pg    MCHC 33.7 30.0 - 36.0 g/dL   RDW 13.7 11.5 - 15.5 %   Platelets 208 150 - 400 K/uL    Comment: Performed at [facility]  TSH     Status: None   Collection Time: 08/09/16  6:32 AM  Result Value Ref Range   TSH 2.090 0.350 - 4.500 uIU/mL    Comment: Performed by a 3rd Generation assay with a functional sensitivity of <=0.01 uIU/mL. Performed at [facility]   Hepatic function panel     Status: Abnormal   Collection Time: 08/09/16  6:32 AM  Result Value Ref Range   Total Protein 7.5 6.5 - 8.1 g/dL   Albumin 4.4 3.5 - 5.0 g/dL   AST 27 15 - 41 U/L   ALT 18 17 - 63 U/L   Alkaline Phosphatase 60 38 - 126 U/L   Total Bilirubin 0.8 0.3 - 1.2 mg/dL   Bilirubin, Direct <0.1 (L) 0.1 - 0.5 mg/dL   Indirect Bilirubin NOT CALCULATED 0.3 - 0.9 mg/dL    Comment: Performed at [facility]    Blood Alcohol level:  Lab Results  Component Value Date   [facility] <5 06/21/2016   ETH <5 40/98/1191    Metabolic Disorder Labs:  Lab Results  Component Value Date   HGBA1C 5.8 (H) 06/02/2015   MPG 120 06/02/2015   MPG 126 (H) 05/14/2012   No results found for: PROLACTIN Lab Results  Component Value Date   CHOL 199 06/02/2015   TRIG 390 (H) 06/02/2015   HDL 31 (L) 06/02/2015   CHOLHDL 6.4 06/02/2015   VLDL 78 (H) 06/02/2015   LDLCALC 90 06/02/2015   LDLCALC 118 (H) 05/14/2012    Current Medications: Current Facility-Administered Medications  Medication Dose Route Frequency Provider Last Rate Last Dose  . acetaminophen (TYLENOL) tablet 650 mg  650 mg Oral Q6H PRN Laverle Hobby, PA-C   650 mg at 08/09/16 4782  . albuterol (PROVENTIL HFA;VENTOLIN HFA) 108 (90 Base) MCG/ACT inhaler 2 puff  2 puff Inhalation Q6H PRN Laverle Hobby, PA-C   2 puff at 08/09/16 [phone]  . alum & mag hydroxide-simeth (MAALOX/MYLANTA) 200-200-20 MG/5ML suspension 30 mL  30 mL Oral Q4H PRN Laverle Hobby, PA-C      . asenapine (SAPHRIS) sublingual tablet 5 mg  5 mg  Sublingual BID Laverle Hobby, PA-C   5 mg at 08/09/16 1308  . benzonatate (TESSALON) capsule 200 mg  200 mg Oral TID PRN Laverle Hobby, PA-C  200 mg at 08/09/16 3664  . benztropine (COGENTIN) tablet 1 mg  1 mg Oral Daily Laverle Hobby, PA-C   1 mg at 08/09/16 4034  . divalproex (DEPAKOTE ER) 24 hr tablet 500 mg  500 mg Oral Daily Laverle Hobby, PA-C   500 mg at 08/09/16 7425  . hydrOXYzine (ATARAX/VISTARIL) tablet 25 mg  25 mg Oral Q6H PRN Laverle Hobby, PA-C   25 mg at 08/08/16 2342  . ibuprofen (ADVIL,MOTRIN) tablet 600 mg  600 mg Oral Q6H PRN Laverle Hobby, PA-C      . [START ON 08/10/2016] Influenza vac split quadrivalent PF (FLUARIX) injection 0.5 mL  0.5 mL Intramuscular Tomorrow-1000 Fernando A Cobos, MD      . magnesium hydroxide (MILK OF MAGNESIA) suspension 30 mL  30 mL Oral Daily PRN Laverle Hobby, PA-C      . nicotine polacrilex (NICORETTE) gum 2 mg  2 mg Oral PRN Myer Peer Cobos, MD      . QUEtiapine (SEROQUEL) tablet 100 mg  100 mg Oral QHS,MR X 1 Spencer E Simon, PA-C       PTA Medications: Prescriptions Prior to Admission  Medication Sig Dispense Refill Last Dose  . albuterol (PROVENTIL HFA;VENTOLIN HFA) 108 (90 Base) MCG/ACT inhaler Inhale 2 puffs into the lungs every 6 (six) hours as needed for wheezing or shortness of breath.   Past Month at Unknown time  . benzonatate (TESSALON) 100 MG capsule Take 2 capsules (200 mg total) by mouth 2 (two) times daily as needed for cough. 20 capsule 0   . benztropine (COGENTIN) 1 MG tablet Take 1 mg by mouth 2 (two) times daily.   06/19/2016 at Unknown time  . divalproex (DEPAKOTE ER) 500 MG 24 hr tablet Take 500 mg by mouth 2 (two) times daily.   06/19/2016 at Unknown time  . ibuprofen (ADVIL,MOTRIN) 600 MG tablet Take 1 tablet (600 mg total) by mouth every 6 (six) hours as needed. 30 tablet 0   . oxymetazoline (AFRIN NASAL SPRAY) 0.05 % nasal spray Place 1 spray into both nostrils 2 (two) times daily. Spray once into each  nostril twice daily for up to the next 3 days. Do not use for more than 3 days to prevent rebound rhinorrhea. 30 mL 0   . QUEtiapine (SEROQUEL) 100 MG tablet Take 1 tablet (100 mg total) by mouth at bedtime. (Patient not taking: Reported on 06/20/2016) 30 tablet 0 Not Taking at Unknown time  . risperiDONE (RISPERDAL) 3 MG tablet Take 3 mg by mouth at bedtime.   06/19/2016 at Unknown time    Musculoskeletal: Strength & Muscle Tone: within normal limits Gait & Station: normal Patient leans: N/A  Psychiatric Specialty Exam: Physical Exam  Nursing note and vitals reviewed. Constitutional: He is oriented to person, place, and time. He appears well-developed and well-nourished.  Neurological: He is alert and oriented to person, place, and time.  tremors    Review of Systems  Respiratory: Positive for cough.   Psychiatric/Behavioral: Positive for depression and substance abuse. Negative for hallucinations and suicidal ideas. The patient is nervous/anxious and has insomnia.   All other systems reviewed and are negative.   Blood pressure 118/88, pulse (!) 101, temperature 98 F (36.7 C), temperature source Oral, resp. rate 16, height '5\' 9"'$  (1.753 m), weight 210 lb (95.3 kg), SpO2 97 %.Body mass index is 31.01 kg/m.  General Appearance: Fairly Groomed  Eye Contact:  Fair  Speech:  Clear and Coherent  Volume:  Normal  Mood:  Depressed  Affect:  Blunt  Thought Process:  Coherent and Goal Directed  Orientation:  Full (Time, Place, and Person)  Thought Content:  Logical  Suicidal Thoughts:  No  Homicidal Thoughts:  No  Memory:  Immediate;   Good Recent;   Good Remote;   Good  Judgement:  Good  Insight:  Fair  Psychomotor Activity:  Normal  Concentration:  Concentration: Good and Attention Span: Good  Recall:  Good  Fund of Knowledge:  Good  Language:  Good  Akathisia:  No  Handed:  Right  AIMS (if indicated):     Assets:  Communication Skills Desire for Improvement  ADL's:  Intact   Cognition:  WNL  Sleep:  Number of Hours: 5.75   Assessment Tylin is a 20 year old male, with schizophrenia, bipolar disorder per report,  being admitted voluntarily as a walk in to [facility].  He came in with suicidal ideation with plan to hang self.   # unspecified mood disorder # r/o bipolar disorder #r/o substance induced mood disorder Exam is notable for his blunt affect, limited insight into his mental health, and patient endorses neurovegetative symptoms and some irritability on today's evaluation. Although he carries diagnosis of bipolar disorder/schizophrenia, he denies any significant symptoms consistent with this. Will continue to evaluate. Will obtain record from his outpatient provider. Until then, will discontinue saphris to avoid polypharmacy. Will consider uptitrating Depakote in the future to optimize its effect.   Plan - Continue Depakote ER 500 mg daily - Continue quetiapine 100 mg qhs, start 50 mg q8hprn for agitation - Discontinue cogentin, saphris - check lipid, HbA1c - - Admit for crisis management and stabilization. - Medication management to reduce current symptoms to base line and improve the patient's overall level of functioning. - Monitor for the adverse effect of the medications and anger outbursts - Continue 15 minutes observation for safety concerns - Encouraged to participate in milieu therapy and group therapy counseling sessions and also work with coping skills -  Develop treatment plan to decrease risk of relapse upon discharge and to reduce the need for readmission. -  Psycho-social education regarding relapse prevention and self care. - Health care follow up as needed for medical problems. - Restart home medications where appropriate.   Treatment Plan Summary: Daily contact with patient to assess and evaluate symptoms and progress in treatment  Observation Level/Precautions:  15 minute checks  Laboratory:  as needed  Psychotherapy:  Group therapy   Medications:  As above  Consultations:  As needed  Discharge Concerns:  -  Estimated LOS:5-7 days  Other:     Physician Treatment Plan for Primary Diagnosis: Bipolar 2 disorder ([place]) Long Term Goal(s): Improvement in symptoms so as ready for discharge  Short Term Goals: Ability to demonstrate self-control will improve and Ability to identify and develop effective coping behaviors will improve  Physician Treatment Plan for Secondary Diagnosis: Principal Problem:   Bipolar 2 disorder ([place])  Long Term Goal(s): Improvement in symptoms so as ready for discharge  Short Term Goals: Ability to demonstrate self-control will improve and Ability to identify and develop effective coping behaviors will improve  I certify that inpatient services furnished can reasonably be expected to improve the patient's condition.    Norman Clay, MD 12/21/201711:23 AM

## 2016-08-10 LAB — HEMOGLOBIN A1C
Hgb A1c MFr Bld: 5.6 % (ref 4.8–5.6)
MEAN PLASMA GLUCOSE: 114 mg/dL

## 2016-08-10 LAB — LIPID PANEL
Cholesterol: 190 mg/dL (ref 0–200)
HDL: 45 mg/dL (ref >40)
LDL CALC: 126 mg/dL — AB (ref 0–99)
TRIGLYCERIDES: 94 mg/dL (ref <150)
Total CHOL/HDL Ratio: 4.2 RATIO
VLDL: 19 mg/dL (ref 0–40)

## 2016-08-10 LAB — DRUG PROFILE, UR, 9 DRUGS (LABCORP)
AMPHETAMINES, URINE: NEGATIVE ng/mL
Barbiturate, Ur: NEGATIVE ng/mL
Benzodiazepine Quant, Ur: NEGATIVE ng/mL
Cannabinoid Quant, Ur: NEGATIVE ng/mL
Cocaine (Metab.): NEGATIVE ng/mL
METHADONE SCREEN, URINE: NEGATIVE ng/mL
Opiate Quant, Ur: NEGATIVE ng/mL
PROPOXYPHENE, URINE: NEGATIVE ng/mL
Phencyclidine, Ur: NEGATIVE ng/mL

## 2016-08-10 LAB — PROLACTIN: PROLACTIN: 65.2 ng/mL — AB (ref 4.0–15.2)

## 2016-08-10 MED ORDER — DIVALPROEX SODIUM ER 250 MG PO TB24
250.0000 mg | ORAL_TABLET | Freq: Once | ORAL | Status: AC
  Administered 2016-08-10: 250 mg via ORAL
  Filled 2016-08-10: qty 1

## 2016-08-10 MED ORDER — DIVALPROEX SODIUM ER 250 MG PO TB24
750.0000 mg | ORAL_TABLET | Freq: Every day | ORAL | Status: DC
  Administered 2016-08-11 – 2016-08-14 (×4): 750 mg via ORAL
  Filled 2016-08-10 (×6): qty 3

## 2016-08-10 NOTE — Progress Notes (Signed)
D:  Patient's self inventory sheet, patient sleeps good, sleep medication is helpful.  Fair appetite, normal energy level, poor concentration.  Rated depression 7, hopeless 6, anxiety 5.  Denied withdrawals.  SI, no plan, contracts for safety.  Denied pain.  Goal is to interact with other.   A:  Medications administered per MD orders.  Emotional support and encouragement. R:  Denied HI.  Denied SI, contracts for safety, no plan.  Denied A/V hallucinations.  Safety maintained with 15 minute checks.

## 2016-08-10 NOTE — Progress Notes (Signed)
Patient ID: Ryan Bartlett, male   DOB: August 27, 1995, 20 y.o.   MRN: [id number] D: Client visible on the unit, in dayroom interacting with peers, watching TV and talking to staff. Client complains of LL tooth pain. "I think I go a wisdom tooth broken" A: Writer provided emotional support, Ibuprofen administered for pain (see MAR). Client encouraged not to chew or drink cold fluid on the affected side. Medications reviewed, administered as ordered. Staff will monitor q4415min for safety. R: Client is safe on the unit.

## 2016-08-10 NOTE — Progress Notes (Signed)
Pt attended AA meeting this evening.  

## 2016-08-10 NOTE — Progress Notes (Signed)
CSW attempted to contact [facility] team [facility] to discuss patient's care but no answer.   Samuella BruinKristin Claudio Mondry, [name] Clinical Social Worker [facility] [phone]

## 2016-08-10 NOTE — Progress Notes (Signed)
[facility] MD Progress Note  08/10/2016 11:45 AM Ryan Bartlett  MRN:  [id number] Subjective:   Patient seen, chart reviewed and case discussed with nursing staff.   Patient states that he feels more "hyper" today. He tries to do coloring to  discharge his energy. He feels anxious that his mother is coming to visit him. He is interested in sobriety from meth; last use 3 months ago. He sleeps well with his medication. He denies SI, HI, AH/VH.   Principal Problem: Bipolar 2 disorder (HCC) Diagnosis:   Patient Active Problem List   Diagnosis Date Noted  . Bipolar 2 disorder (HCC) [F31.81] 08/08/2016  . 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 Bipolar affective disorder, current episode depressed (HCC) [F31.30] 03/08/2016  . Bipolar affect, depressed (HCC) [F31.30] 09/18/2015  . Bipolar affective disorder, currently depressed, mild (HCC) [F31.31] 09/17/2015  . Intentional overdose of drug in tablet form (HCC) [T50.902A]   . Major depressive disorder, recurrent episode, moderate (HCC) [F33.1] 07/09/2015  . Depression [F32.9]   . Suicidal ideation [R45.851]   . Cannabis use disorder, moderate, dependence (HCC) [F12.20] 06/06/2015  . Alcohol use disorder, moderate, dependence (HCC) [F10.20] 06/06/2015  . Bipolar I disorder, most recent episode depressed (HCC) [F31.30] 05/23/2013  . Cannabis abuse [F12.10] 03/30/2012  . ADHD (attention deficit hyperactivity disorder), combined type [F90.2] 08/28/2011  . Conduct disorder, adolescent onset type [F91.2] 08/28/2011   Total Time spent with patient: 20 minutes  Past Psychiatric History:  Outpatient: Dx: bipolar, schizoaffective, seen last month, [facility] interventions per chart Psychiatry admission: in [place] 05/2016, overdosing medication Previous suicide attempt: "a lot", last in 08/2015 by overdosing medication,other attempts including trying to hang himself in jail Past trials of medication: Depakote, risperidone, saphris,  cogentin History of violence:  Legal: was in jail in 2016, pending charges for breakin and entry  Past Medical History:  Past Medical History:  Diagnosis Date  . ADHD (attention deficit hyperactivity disorder)   . Arthritis   . Asthma   . Bipolar disorder (HCC)   . Depression   . Oppositional defiant disorder   . Unspecified episodic mood disorder     Past Surgical History:  Procedure Laterality Date  . NO PAST SURGERIES     Family History:  Family History  Problem Relation Age of Onset  . Depression Mother   . Diabetes Other   . Hyperlipidemia Other   . Hypertension Other    Family Psychiatric  History: denies Social History:  History  Alcohol Use  . 3.6 oz/week  . 6 Cans of beer per week    Comment: "I actually recently quit"     History  Drug Use  . Types: Marijuana, Methamphetamines    Social History   Social History  . Marital status: Single    Spouse name: N/A  . Number of children: N/A  . Years of education: N/A   Occupational History  . student Minor    12th grade at [employer]   Social History Main Topics  . Smoking status: Current Every Day Smoker    Packs/day: 0.50    Years: 0.00    Types: Cigarettes  . Smokeless tobacco: Never Used  . Alcohol use 3.6 oz/week    6 Cans of beer per week     Comment: "I actually recently quit"  . Drug use:     Types: Marijuana, Methamphetamines  . Sexual activity: Not Currently    Partners: Female     Comment: Pt reports that  he is not sexually active   Other Topics Concern  . None   Social History Narrative  . None   Additional Social History:    Pain Medications: None Prescriptions: Has not taken in 2 weeks. Over the Counter: None History of alcohol / drug use?: Yes Longest period of sobriety (when/how long): unknown Negative Consequences of Use: Financial, Legal Name of Substance 1: Marijuana 1 - Age of First Use: Teens 1 - Amount (size/oz): 2 blunts per episode 1 - Frequency: Once per week  on average 1 - Duration: on-going 1 - Last Use / Amount: Two days ago                  Sleep: Fair  Appetite:  Fair  Current Medications: Current Facility-Administered Medications  Medication Dose Route Frequency Provider Last Rate Last Dose  . acetaminophen (TYLENOL) tablet 650 mg  650 mg Oral Q6H PRN Laverle Hobby, PA-C   650 mg at 08/10/16 1112  . albuterol (PROVENTIL HFA;VENTOLIN HFA) 108 (90 Base) MCG/ACT inhaler 2 puff  2 puff Inhalation Q6H PRN Laverle Hobby, PA-C   2 puff at 08/09/16 [phone]  . alum & mag hydroxide-simeth (MAALOX/MYLANTA) 200-200-20 MG/5ML suspension 30 mL  30 mL Oral Q4H PRN Laverle Hobby, PA-C      . benzonatate (TESSALON) capsule 200 mg  200 mg Oral TID PRN Laverle Hobby, PA-C   200 mg at 08/10/16 0731  . divalproex (DEPAKOTE ER) 24 hr tablet 500 mg  500 mg Oral Daily Laverle Hobby, PA-C   500 mg at 08/10/16 9892  . hydrOXYzine (ATARAX/VISTARIL) tablet 25 mg  25 mg Oral Q6H PRN Laverle Hobby, PA-C   25 mg at 08/08/16 2342  . ibuprofen (ADVIL,MOTRIN) tablet 600 mg  600 mg Oral Q6H PRN Laverle Hobby, PA-C      . magnesium hydroxide (MILK OF MAGNESIA) suspension 30 mL  30 mL Oral Daily PRN Laverle Hobby, PA-C      . nicotine polacrilex (NICORETTE) gum 2 mg  2 mg Oral PRN Jenne Campus, MD   2 mg at 08/10/16 1111  . QUEtiapine (SEROQUEL) tablet 100 mg  100 mg Oral QHS,MR X 1 Laverle Hobby, PA-C   100 mg at 08/09/16 2204  . QUEtiapine (SEROQUEL) tablet 50 mg  50 mg Oral Q8H PRN Norman Clay, MD        Lab Results:  Results for orders placed or performed during the hospital encounter of 08/08/16 (from the past 48 hour(s))  Basic metabolic panel     Status: None   Collection Time: 08/09/16  6:32 AM  Result Value Ref Range   Sodium 138 135 - 145 mmol/L   Potassium 3.6 3.5 - 5.1 mmol/L   Chloride 106 101 - 111 mmol/L   CO2 24 22 - 32 mmol/L   Glucose, Bld 99 65 - 99 mg/dL   BUN 15 6 - 20 mg/dL   Creatinine, Ser 0.67 0.61 - 1.24 mg/dL    Calcium 9.3 8.9 - 10.3 mg/dL   GFR calc non Af Amer >60 >60 mL/min   GFR calc Af Amer >60 >60 mL/min    Comment: (NOTE) The eGFR has been calculated using the CKD EPI equation. This calculation has not been validated in all clinical situations. eGFR's persistently <60 mL/min signify possible Chronic Kidney Disease.    Anion gap 8 5 - 15    Comment: Performed at [place]  Status: Abnormal   Collection Time: 08/09/16  6:32 AM  Result Value Ref Range   WBC 10.6 (H) 4.0 - 10.5 K/uL   RBC 5.17 4.22 - 5.81 MIL/uL   Hemoglobin 14.5 13.0 - 17.0 g/dL   HCT 43.0 39.0 - 52.0 %   MCV 83.2 78.0 - 100.0 fL   MCH 28.0 26.0 - 34.0 pg   MCHC 33.7 30.0 - 36.0 g/dL   RDW 13.7 11.5 - 15.5 %   Platelets 208 150 - 400 K/uL    Comment: Performed at [facility]  TSH     Status: None   Collection Time: 08/09/16  6:32 AM  Result Value Ref Range   TSH 2.090 0.350 - 4.500 uIU/mL    Comment: Performed by a 3rd Generation assay with a functional sensitivity of <=0.01 uIU/mL. Performed at [facility]   Prolactin     Status: Abnormal   Collection Time: 08/09/16  6:32 AM  Result Value Ref Range   Prolactin 65.2 (H) 4.0 - 15.2 ng/mL    Comment: (NOTE) Performed At: [facility] [place], [place] [id number] Lindon Romp MD [id number] Performed at [facility]   Hemoglobin A1c     Status: None   Collection Time: 08/09/16  6:32 AM  Result Value Ref Range   Hgb A1c MFr Bld 5.6 4.8 - 5.6 %    Comment: (NOTE)         Pre-diabetes: 5.7 - 6.4         Diabetes: >6.4         Glycemic control for adults with diabetes: <7.0    Mean Plasma Glucose 114 mg/dL    Comment: (NOTE) Performed At: [facility] [place], [place] [id number] Lindon Romp MD [id number] Performed at [facility]   Lipid panel     Status: Abnormal   Collection Time: 08/09/16   6:32 AM  Result Value Ref Range   Cholesterol 203 (H) 0 - 200 mg/dL   Triglycerides 116 <150 mg/dL   HDL 47 >40 mg/dL   Total CHOL/HDL Ratio 4.3 RATIO   VLDL 23 0 - 40 mg/dL   LDL Cholesterol 133 (H) 0 - 99 mg/dL    Comment:        Total Cholesterol/HDL:CHD Risk Coronary Heart Disease Risk Table                     Men   Women  1/2 Average Risk   3.4   3.3  Average Risk       5.0   4.4  2 X Average Risk   9.6   7.1  3 X Average Risk  23.4   11.0        Use the calculated Patient Ratio above and the CHD Risk Table to determine the patient's CHD Risk.        ATP III CLASSIFICATION (LDL):  <100     mg/dL   Optimal  100-129  mg/dL   Near or Above                    Optimal  130-159  mg/dL   Borderline  160-189  mg/dL   High  >190     mg/dL   Very High Performed at [facility]   Hepatic function panel     Status: Abnormal   Collection Time: 08/09/16  6:32 AM  Result Value Ref Range   Total Protein 7.5 6.5 - 8.1 g/dL   Albumin 4.4 3.5 - 5.0 g/dL   AST 27 15 - 41 U/L   ALT 18 17 - 63 U/L   Alkaline Phosphatase 60 38 - 126 U/L   Total Bilirubin 0.8 0.3 - 1.2 mg/dL   Bilirubin, Direct <3.5 (L) 0.1 - 0.5 mg/dL   Indirect Bilirubin NOT CALCULATED 0.3 - 0.9 mg/dL    Comment: Performed at [facility]  Lipid panel     Status: Abnormal   Collection Time: 08/10/16  6:30 AM  Result Value Ref Range   Cholesterol 190 0 - 200 mg/dL   Triglycerides 94 <521 mg/dL   HDL 45 >74 mg/dL   Total CHOL/HDL Ratio 4.2 RATIO   VLDL 19 0 - 40 mg/dL   LDL Cholesterol 715 (H) 0 - 99 mg/dL    Comment:        Total Cholesterol/HDL:CHD Risk Coronary Heart Disease Risk Table                     Men   Women  1/2 Average Risk   3.4   3.3  Average Risk       5.0   4.4  2 X Average Risk   9.6   7.1  3 X Average Risk  23.4   11.0        Use the calculated Patient Ratio above and the CHD Risk Table to determine the patient's CHD Risk.        ATP III CLASSIFICATION  (LDL):  <100     mg/dL   Optimal  953-967  mg/dL   Near or Above                    Optimal  130-159  mg/dL   Borderline  289-791  mg/dL   High  >504     mg/dL   Very High Performed at [facility]     Blood Alcohol level:  Lab Results  Component Value Date   [facility] <5 06/21/2016   ETH <5 05/01/2016    Metabolic Disorder Labs: Lab Results  Component Value Date   HGBA1C 5.6 08/09/2016   MPG 114 08/09/2016   MPG 120 06/02/2015   Lab Results  Component Value Date   PROLACTIN 65.2 (H) 08/09/2016   Lab Results  Component Value Date   CHOL 190 08/10/2016   TRIG 94 08/10/2016   HDL 45 08/10/2016   CHOLHDL 4.2 08/10/2016   VLDL 19 08/10/2016   LDLCALC 126 (H) 08/10/2016   LDLCALC 133 (H) 08/09/2016    Physical Findings: AIMS: Facial and Oral Movements Muscles of Facial Expression: None, normal Lips and Perioral Area: None, normal Jaw: None, normal Tongue: None, normal,Extremity Movements Upper (arms, wrists, hands, fingers): None, normal Lower (legs, knees, ankles, toes): None, normal, Trunk Movements Neck, shoulders, hips: None, normal, Overall Severity Severity of abnormal movements (highest score from questions above): None, normal Incapacitation due to abnormal movements: None, normal Patient's awareness of abnormal movements (rate only patient's report): No Awareness, Dental Status Current problems with teeth and/or dentures?: No Does patient usually wear dentures?: No  CIWA:  CIWA-Ar Total: 1 COWS:  COWS Total Score: 1  Musculoskeletal: Strength & Muscle Tone: within normal limits Gait & Station: normal Patient leans: N/A  Psychiatric Specialty Exam: Physical Exam  Review of Systems  Psychiatric/Behavioral: Negative for depression, hallucinations, substance abuse and suicidal ideas. The  patient is nervous/anxious and has insomnia.   All other systems reviewed and are negative.   Blood pressure 116/74, pulse 75, temperature 98.2 F (36.8 C),  temperature source Oral, resp. rate 16, height '5\' 9"'$  (1.753 m), weight 210 lb (95.3 kg), SpO2 97 %.Body mass index is 31.01 kg/m.  General Appearance: Fairly Groomed  Eye Contact:  Good  Speech:  Clear and Coherent  Volume:  Normal  Mood:  "hyper"  Affect:  anxious  Thought Process:  Coherent and Goal Directed  Orientation:  Full (Time, Place, and Person)  Thought Content:  Logical Perceptions: denies AH/VH  Suicidal Thoughts:  No  Homicidal Thoughts:  No  Memory:  Immediate;   Good Recent;   Good Remote;   Good  Judgement:  Fair  Insight:  Fair  Psychomotor Activity:  Normal  Concentration:  Concentration: Good and Attention Span: Good  Recall:  Good  Fund of Knowledge:  Good  Language:  Good  Akathisia:  No  Handed:  Right  AIMS (if indicated):     Assets:  Communication Skills Desire for Improvement  ADL's:  Intact  Cognition:  WNL  Sleep:  Number of Hours: [place] is a 20 year old male, with schizophrenia, bipolar disorder per report,  being admitted voluntarily as a walk in to [facility] He came in with suicidal ideation with plan to hang self.   # unspecified mood disorder # r/o bipolar disorder #r/o substance induced mood disorder Today's exam is notable for his euthymic affect, which is significantly different from yesterday evaluation. Will slowly increase depakote to optimize its effect given his reported elevated energy.   Plan - Increase Depakote ER 750 mg daily (cosider monitoring level in 5 days) - Continue quetiapine 100 mg qhs, start 50 mg q8hprn for agitation - Continue to hold cogentin, saphris - check lipid, HbA1c - - Admit for crisis management and stabilization. - Medication management to reduce current symptoms to base line and improve the patient's overall level of functioning. - Monitor for the adverse effect of the medications and anger outbursts - Continue 15 minutes observation for safety concerns - Encouraged to participate in  milieu therapy and group therapy counseling sessions and also work with coping skills -  Develop treatment plan to decrease risk of relapse upon discharge and to reduce the need for readmission. -  Psycho-social education regarding relapse prevention and self care. - Health care follow up as needed for medical problems. - Restart home medications where appropriate.  Treatment Plan Summary: Daily contact with patient to assess and evaluate symptoms and progress in treatment  Norman Clay, MD 08/10/2016, 11:45 AM

## 2016-08-10 NOTE — Plan of Care (Signed)
Problem: Education: Goal: Knowledge of the prescribed therapeutic regimen will improve Outcome: Progressing Nurse discussed depression/coping skills with patient.        

## 2016-08-10 NOTE — Progress Notes (Signed)
Recreation Therapy Notes  Date: 08/10/16 Time: 0930 Location: 300 Hall Group Room  Group Topic: Stress Management  Goal Area(s) Addresses:  Patient will verbalize importance of using healthy stress management.  Patient will identify positive emotions associated with healthy stress management.   Intervention: Calm App  Activity :  Gratitude Meditation.  LRT introduced the stress management technique of meditation to group.  LRT played the meditation from the Calm app to allow patients to focus on things they should be grateful for and not the things they don't have.  Patients were to follow along as the meditation played to engage in the technique.  Education:  Stress Management, Discharge Planning.   Education Outcome: Acknowledges edcuation/In group clarification offered/Needs additional education  Clinical Observations/Feedback: Pt did not attend group.   Ryan Bartlett, LRT/CTRS         Kariana Wiles A 08/10/2016 11:39 AM 

## 2016-08-10 NOTE — [facility] Group Notes (Signed)
[facility] [name] Group Therapy  08/10/2016 2:27 PM  Type of Therapy:  Group Therapy  Participation Level:  Invited, chose not to attend.  Modes of Intervention:  Discussion, Exploration and Problem-solving  Summary of Progress/Problems: Feelings around Relapse. Group members discussed the meaning of relapse and shared personal stories of relapse, how it affected them and others, and how they perceived themselves during this time. Group members were encouraged to identify triggers, warning signs and coping skills used when facing the possibility of relapse. Social supports were discussed and explored in detail. Post Acute Withdrawal Syndrome (handout provided) was introduced and examined. Pt's were encouraged to ask questions, talk about key points associated with PAWS, and process this information in terms of relapse prevention.    Damere Brandenburg C Darnice Comrie 08/10/2016, 2:27 PM   

## 2016-08-11 DIAGNOSIS — Z818 Family history of other mental and behavioral disorders: Secondary | ICD-10-CM

## 2016-08-11 DIAGNOSIS — F3181 Bipolar II disorder: Principal | ICD-10-CM

## 2016-08-11 DIAGNOSIS — F17210 Nicotine dependence, cigarettes, uncomplicated: Secondary | ICD-10-CM

## 2016-08-11 DIAGNOSIS — Z8249 Family history of ischemic heart disease and other diseases of the circulatory system: Secondary | ICD-10-CM

## 2016-08-11 DIAGNOSIS — Z833 Family history of diabetes mellitus: Secondary | ICD-10-CM

## 2016-08-11 DIAGNOSIS — Z79899 Other long term (current) drug therapy: Secondary | ICD-10-CM

## 2016-08-11 LAB — LIPID PANEL
Cholesterol: 181 mg/dL (ref 0–200)
HDL: 43 mg/dL (ref >40)
LDL Cholesterol: 115 mg/dL — ABNORMAL HIGH (ref 0–99)
TRIGLYCERIDES: 116 mg/dL (ref <150)
Total CHOL/HDL Ratio: 4.2 RATIO
VLDL: 23 mg/dL (ref 0–40)

## 2016-08-11 LAB — HEMOGLOBIN A1C
HEMOGLOBIN A1C: 5.7 % — AB (ref 4.8–5.6)
MEAN PLASMA GLUCOSE: 117 mg/dL

## 2016-08-11 NOTE — Progress Notes (Signed)
[facility] MD Progress Note  08/11/2016 11:00 AM Ryan Bartlett  MRN:  [id number] Subjective:   Patient seen, chart reviewed and case discussed with nursing staff.   Patient less hyper since getting seroquel last night. Also on depakote. Wants to cut down his extra seroquel at night which was repeated Less anxious but says he may get animnated. Otherwise cooperative Looking to remain sober off meth. Denies suicidal or homicidal ideation.   .   Principal Problem: Bipolar 2 disorder (HCC) Diagnosis:   Patient Active Problem List   Diagnosis Date Noted  . Bipolar 2 disorder (HCC) [F31.81] 08/08/2016  . 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 Bipolar affective disorder, current episode depressed (HCC) [F31.30] 03/08/2016  . Bipolar affect, depressed (HCC) [F31.30] 09/18/2015  . Bipolar affective disorder, currently depressed, mild (HCC) [F31.31] 09/17/2015  . Intentional overdose of drug in tablet form (HCC) [T50.902A]   . Major depressive disorder, recurrent episode, moderate (HCC) [F33.1] 07/09/2015  . Depression [F32.9]   . Suicidal ideation [R45.851]   . Cannabis use disorder, moderate, dependence (HCC) [F12.20] 06/06/2015  . Alcohol use disorder, moderate, dependence (HCC) [F10.20] 06/06/2015  . Bipolar I disorder, most recent episode depressed (HCC) [F31.30] 05/23/2013  . Cannabis abuse [F12.10] 03/30/2012  . ADHD (attention deficit hyperactivity disorder), combined type [F90.2] 08/28/2011  . Conduct disorder, adolescent onset type [F91.2] 08/28/2011   Total Time spent with patient: 20 minutes  Past Psychiatric History:  Outpatient: Dx: bipolar, schizoaffective, seen last month, [facility] per chart Psychiatry admission: in [place] 05/2016, overdosing medication Previous suicide attempt: "a lot", last in 08/2015 by overdosing medication,other attempts including trying to hang himself in jail Past trials of medication: Depakote, risperidone, saphris,  cogentin History of violence:  Legal: was in jail in 2016, pending charges for breakin and entry  Past Medical History:  Past Medical History:  Diagnosis Date  . ADHD (attention deficit hyperactivity disorder)   . Arthritis   . Asthma   . Bipolar disorder (HCC)   . Depression   . Oppositional defiant disorder   . Unspecified episodic mood disorder     Past Surgical History:  Procedure Laterality Date  . NO PAST SURGERIES     Family History:  Family History  Problem Relation Age of Onset  . Depression Mother   . Diabetes Other   . Hyperlipidemia Other   . Hypertension Other    Family Psychiatric  History: denies Social History:  History  Alcohol Use  . 3.6 oz/week  . 6 Cans of beer per week    Comment: "I actually recently quit"     History  Drug Use  . Types: Marijuana, Methamphetamines    Social History   Social History  . Marital status: Single    Spouse name: N/A  . Number of children: N/A  . Years of education: N/A   Occupational History  . student Minor    12th grade at [employer]   Social History Main Topics  . Smoking status: Current Every Day Smoker    Packs/day: 0.50    Years: 0.00    Types: Cigarettes  . Smokeless tobacco: Never Used  . Alcohol use 3.6 oz/week    6 Cans of beer per week     Comment: "I actually recently quit"  . Drug use:     Types: Marijuana, Methamphetamines  . Sexual activity: Not Currently    Partners: Female     Comment: Pt reports that he is not sexually active  Other Topics Concern  . None   Social History Narrative  . None   Additional Social History:    Pain Medications: None Prescriptions: Has not taken in 2 weeks. Over the Counter: None History of alcohol / drug use?: Yes Longest period of sobriety (when/how long): unknown Negative Consequences of Use: Financial, Legal Name of Substance 1: Marijuana 1 - Age of First Use: Teens 1 - Amount (size/oz): 2 blunts per episode 1 - Frequency: Once per week  on average 1 - Duration: on-going 1 - Last Use / Amount: Two days ago                  Sleep: Fair  Appetite:  Fair  Current Medications: Current Facility-Administered Medications  Medication Dose Route Frequency Provider Last Rate Last Dose  . acetaminophen (TYLENOL) tablet 650 mg  650 mg Oral Q6H PRN Kerry HoughSpencer E Simon, PA-C   650 mg at 08/10/16 1112  . albuterol (PROVENTIL HFA;VENTOLIN HFA) 108 (90 Base) MCG/ACT inhaler 2 puff  2 puff Inhalation Q6H PRN Kerry HoughSpencer E Simon, PA-C   2 puff at 08/09/16 [phone]  . alum & mag hydroxide-simeth (MAALOX/MYLANTA) 200-200-20 MG/5ML suspension 30 mL  30 mL Oral Q4H PRN Kerry HoughSpencer E Simon, PA-C      . benzonatate (TESSALON) capsule 200 mg  200 mg Oral TID PRN Kerry HoughSpencer E Simon, PA-C   200 mg at 08/11/16 0135  . divalproex (DEPAKOTE ER) 24 hr tablet 750 mg  750 mg Oral Daily Neysa Hottereina Hisada, MD   750 mg at 08/11/16 0944  . hydrOXYzine (ATARAX/VISTARIL) tablet 25 mg  25 mg Oral Q6H PRN Kerry HoughSpencer E Simon, PA-C   25 mg at 08/08/16 2342  . ibuprofen (ADVIL,MOTRIN) tablet 600 mg  600 mg Oral Q6H PRN Kerry HoughSpencer E Simon, PA-C   600 mg at 08/10/16 2122  . magnesium hydroxide (MILK OF MAGNESIA) suspension 30 mL  30 mL Oral Daily PRN Kerry HoughSpencer E Simon, PA-C      . nicotine polacrilex (NICORETTE) gum 2 mg  2 mg Oral PRN Craige CottaFernando A Cobos, MD   2 mg at 08/10/16 1111  . QUEtiapine (SEROQUEL) tablet 100 mg  100 mg Oral QHS,MR X 1 Thresa RossNadeem Fatoumata Albaugh, MD   100 mg at 08/10/16 2244  . QUEtiapine (SEROQUEL) tablet 50 mg  50 mg Oral Q8H PRN Neysa Hottereina Hisada, MD        Lab Results:  Results for orders placed or performed during the hospital encounter of 08/08/16 (from the past 48 hour(s))  Lipid panel     Status: Abnormal   Collection Time: 08/10/16  6:30 AM  Result Value Ref Range   Cholesterol 190 0 - 200 mg/dL   Triglycerides 94 <119<150 mg/dL   HDL 45 >14>40 mg/dL   Total CHOL/HDL Ratio 4.2 RATIO   VLDL 19 0 - 40 mg/dL   LDL Cholesterol 782126 (H) 0 - 99 mg/dL    Comment:        Total  Cholesterol/HDL:CHD Risk Coronary Heart Disease Risk Table                     Men   Women  1/2 Average Risk   3.4   3.3  Average Risk       5.0   4.4  2 X Average Risk   9.6   7.1  3 X Average Risk  23.4   11.0        Use the calculated Patient Ratio above and the CHD  Risk Table to determine the patient's CHD Risk.        ATP III CLASSIFICATION (LDL):  <100     mg/dL   Optimal  [id number]  mg/dL   Near or Above                    Optimal  130-159  mg/dL   Borderline  [id number]  mg/dL   High  >956>190     mg/dL   Very High Performed at [facility]   Hemoglobin A1c     Status: Abnormal   Collection Time: 08/10/16  6:30 AM  Result Value Ref Range   Hgb A1c MFr Bld 5.7 (H) 4.8 - 5.6 %    Comment: (NOTE)         Pre-diabetes: 5.7 - 6.4         Diabetes: >6.4         Glycemic control for adults with diabetes: <7.0    Mean Plasma Glucose 117 mg/dL    Comment: (NOTE) Performed At: [facility] [address] [place], [place] [id number] Mila HomerHancock William F MD IO:[phone] Performed at [facility]     Blood Alcohol level:  Lab Results  Component Value Date   [facility] <[facility] 06/21/2016   ETH <5 05/01/2016    Metabolic Disorder Labs: Lab Results  Component Value Date   HGBA1C 5.7 (H) 08/10/2016   MPG 117 08/10/2016   MPG 114 08/09/2016   Lab Results  Component Value Date   PROLACTIN 65.2 (H) 08/09/2016   Lab Results  Component Value Date   CHOL 190 08/10/2016   TRIG 94 08/10/2016   HDL 45 08/10/2016   CHOLHDL 4.2 08/10/2016   VLDL 19 08/10/2016   LDLCALC 126 (H) 08/10/2016   LDLCALC 133 (H) 08/09/2016    Physical Findings: AIMS: Facial and Oral Movements Muscles of Facial Expression: None, normal Lips and Perioral Area: None, normal Jaw: None, normal Tongue: None, normal,Extremity Movements Upper (arms, wrists, hands, fingers): None, normal Lower (legs, knees, ankles, toes): None, normal, Trunk Movements Neck, shoulders, hips: None,  normal, Overall Severity Severity of abnormal movements (highest score from questions above): None, normal Incapacitation due to abnormal movements: None, normal Patient's awareness of abnormal movements (rate only patient's report): No Awareness, Dental Status Current problems with teeth and/or dentures?: No Does patient usually wear dentures?: No  CIWA:  CIWA-Ar Total: 0 COWS:  COWS Total Score: 1    Psychiatric Specialty Exam: Physical Exam  Constitutional: He appears well-developed and well-nourished.  Neurological: He is alert.    Review of Systems  Cardiovascular: Negative for chest pain.  Psychiatric/Behavioral: Positive for depression. Negative for hallucinations, substance abuse and suicidal ideas. The patient is nervous/anxious and has insomnia.   All other systems reviewed and are negative.   Blood pressure 117/77, pulse 76, temperature 98.1 F (36.7 C), resp. rate 16, height 5\' 9"  (1.753 m), weight 95.3 kg (210 lb), SpO2 97 %.Body mass index is 31.01 kg/m.  General Appearance: Fairly Groomed  Eye Contact:  Good  Speech:  Clear and Coherent  Volume:  Normal  Mood:  Not hyper, more calmer   Affect:  congruent  Thought Process:  Coherent and Goal Directed  Orientation:  Full (Time, Place, and Person)  Thought Content:  Logical Perceptions: denies AH/VH  Suicidal Thoughts:  No  Homicidal Thoughts:  No  Memory:  Immediate;   Good Recent;   Good Remote;   Good  Judgement:  Fair  Insight:  Fair  Psychomotor Activity:  Normal  Concentration:  Concentration: Good and Attention Span: Good  Recall:  Good  Fund of Knowledge:  Good  Language:  Good  Akathisia:  No  Handed:  Right  AIMS (if indicated):     Assets:  Communication Skills Desire for Improvement  ADL's:  Intact  Cognition:  WNL  Sleep:  Number of Hours: 6.25   Assessment Latavious is a 20 year old male, with schizophrenia, bipolar disorder per report,  being admitted voluntarily as a walk in to [facility]. He  came in with suicidal ideation with plan to hang self.   # unspecified mood disorder # r/o bipolar disorder #r/o substance induced mood disorder Today's exam is notable for his euthymic affect, seroquel has helped along with depakote.  Continue current dose and prefer not to repeat 100mg  after first dose.   Plan - Continue Depakote ER 750 mg daily (cosider monitoring level in 5 days) - Continue quetiapine 100 mg qhs, start 50 mg q8hprn for agitation - Continue to hold cogentin, saphris - check lipid, HbA1c - - Admit for crisis management and stabilization. - Medication management to reduce current symptoms to base line and improve the patient's overall level of functioning. - Monitor for the adverse effect of the medications and anger outbursts - Continue 15 minutes observation for safety concerns - Encouraged to participate in milieu therapy and group therapy counseling sessions and also work with coping skills -  Develop treatment plan to decrease risk of relapse upon discharge and to reduce the need for readmission. -  Psycho-social education regarding relapse prevention and self care. - Health care follow up as needed for medical problems. - Restart home medications where appropriate.  Treatment Plan Summary: Daily contact with patient to assess and evaluate symptoms and progress in treatment  Thresa Ross, MD 08/11/2016, 11:00 AM

## 2016-08-11 NOTE — [facility] Group Notes (Signed)
Nursing Psycho-educational Group: Patient did not attend despite prompting. Refused. 

## 2016-08-11 NOTE — Progress Notes (Signed)
D: Patient's self inventory sheet: patient has fair sleep, recieved sleep medication.Fair  Appetite, low energy level, good concentration. Rated depression 8/10, hopeless (did not reply)/10, anxiety (did not reply)/10. SI/HI/AVH: Endorses frequent thoughts of suicide, denies hallucinations. Able to contract for safety. Physical complaints are denied. Goal is "not to sleep as much".  A: Medications administered, assessed medication knowledge and education given on medication regimen.  Emotional support and encouragement given patient. R: continues to report SI and deny HI , contracts for safety. Safety maintained with 15 minute checks.

## 2016-08-11 NOTE — [facility] Group Notes (Signed)
  [facility] [name] Group Therapy Note  08/11/2016 at 10 to 11 AM  Type of Therapy and Topic:  Group Therapy: Avoiding Self-Sabotaging and Enabling Behaviors  Participation Level:  Did Not Attend despite overhead announcement and CSW knocking on patient's door asking him to attend.   Summary of Progress/Problems:  The main focus of today's process group was for the patient to identify ways in which they have in the past sabotaged their own recovery or even sabotaged the holidays for themselves and perhaps others.   Carney Bernatherine C Natisha Trzcinski, [name]

## 2016-08-11 NOTE — Progress Notes (Signed)
[facility] Group Notes:  (Nursing/MHT/Case Management/Adjunct)  Date:  08/11/2016  Time:  2030 Type of Therapy:  wrap up group  Participation Level:  Minimal  Participation Quality:  Attentive and Supportive  Affect:  Appropriate  Cognitive:  Appropriate  Insight:  Lacking  Engagement in Group:  Supportive  Modes of Intervention:  Clarification, Education and Support  Summary of Progress/Problems: Pt shared very little other than he slept a lot today and it was good because it kept him from messing with people. Pt mentioned wanting to go to treatment after discharge and asked for a list of boarding houses.   Marcille BuffyMcNeil, Alla Sloma S 08/11/2016, 10:00 PM

## 2016-08-12 LAB — HEMOGLOBIN A1C
HEMOGLOBIN A1C: 5.6 % (ref 4.8–5.6)
MEAN PLASMA GLUCOSE: 114 mg/dL

## 2016-08-12 MED ORDER — QUETIAPINE FUMARATE 50 MG PO TABS
50.0000 mg | ORAL_TABLET | Freq: Every day | ORAL | Status: DC
  Administered 2016-08-12: 50 mg via ORAL
  Filled 2016-08-12 (×2): qty 1

## 2016-08-12 MED ORDER — PRAZOSIN HCL 1 MG PO CAPS
1.0000 mg | ORAL_CAPSULE | Freq: Every day | ORAL | Status: DC
  Administered 2016-08-12: 1 mg via ORAL
  Filled 2016-08-12 (×3): qty 1

## 2016-08-12 NOTE — Progress Notes (Signed)
D: Patient's self inventory sheet: patient has good sleep, recieved sleep medication.fair  Appetite, normal energy level, good concentration. Rated depression 8/10, hopeless 8/10, anxiety 8/10. SI/HI/AVH: Denies. Physical complaints are denied. Goal is "call my best friend". Patient in bed all morning, woke up for lunch and socialized and attended afternoon groups. Pleasant and cooperative. A:  Medications administered, assessed medication knowledge and education given on medication regimen.  Emotional support and encouragement given patient. R: Denies SI and HI , contracts for safety. Safety maintained with 15 minute checks.

## 2016-08-12 NOTE — [facility] Group Notes (Signed)
  [facility] [name] Group Therapy Note   08/12/2016  10 to 10:45 AM   Type of Therapy and Topic: Group Therapy: Feelings Around Returning Home & Establishing a Supportive Framework and Activity to Identify signs of Improvement or Decompensation   Participation Level: Pt choose not to attend despite overhead announcement and encouragement from MHT and CSW    Carney Bernatherine C Catlyn Shipton, [name]

## 2016-08-12 NOTE — Progress Notes (Signed)
[facility] MD Progress Note  08/12/2016 11:09 AM Ryan Bartlett  MRN:  [id number] Subjective:   Patient seen, chart reviewed and case discussed with nursing staff.   Patient less hyper since on night seroquel but wants to cut down dose again Otherwise mood is balanced and behaviour is cooperative. Looking to remain sober off meth. Denies suicidal or homicidal ideation.   .   Principal Problem: Bipolar 2 disorder (HCC) Diagnosis:   Patient Active Problem List   Diagnosis Date Noted  . Bipolar 2 disorder (HCC) [F31.81] 08/08/2016  . 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 Bipolar affective disorder, current episode depressed (HCC) [F31.30] 03/08/2016  . Bipolar affect, depressed (HCC) [F31.30] 09/18/2015  . Bipolar affective disorder, currently depressed, mild (HCC) [F31.31] 09/17/2015  . Intentional overdose of drug in tablet form (HCC) [T50.902A]   . Major depressive disorder, recurrent episode, moderate (HCC) [F33.1] 07/09/2015  . Depression [F32.9]   . Suicidal ideation [R45.851]   . Cannabis use disorder, moderate, dependence (HCC) [F12.20] 06/06/2015  . Alcohol use disorder, moderate, dependence (HCC) [F10.20] 06/06/2015  . Bipolar I disorder, most recent episode depressed (HCC) [F31.30] 05/23/2013  . Cannabis abuse [F12.10] 03/30/2012  . ADHD (attention deficit hyperactivity disorder), combined type [F90.2] 08/28/2011  . Conduct disorder, adolescent onset type [F91.2] 08/28/2011   Total Time spent with patient: 20 minutes  Past Psychiatric History:  Outpatient: Dx: bipolar, schizoaffective, seen last month, [facility] per chart Psychiatry admission: in [place] 05/2016, overdosing medication Previous suicide attempt: "a lot", last in 08/2015 by overdosing medication,other attempts including trying to hang himself in jail Past trials of medication: Depakote, risperidone, saphris, cogentin History of violence:  Legal: was in jail in 2016, pending charges  for breakin and entry  Past Medical History:  Past Medical History:  Diagnosis Date  . ADHD (attention deficit hyperactivity disorder)   . Arthritis   . Asthma   . Bipolar disorder (HCC)   . Depression   . Oppositional defiant disorder   . Unspecified episodic mood disorder     Past Surgical History:  Procedure Laterality Date  . NO PAST SURGERIES     Family History:  Family History  Problem Relation Age of Onset  . Depression Mother   . Diabetes Other   . Hyperlipidemia Other   . Hypertension Other    Family Psychiatric  History: denies Social History:  History  Alcohol Use  . 3.6 oz/week  . 6 Cans of beer per week    Comment: "I actually recently quit"     History  Drug Use  . Types: Marijuana, Methamphetamines    Social History   Social History  . Marital status: Single    Spouse name: N/A  . Number of children: N/A  . Years of education: N/A   Occupational History  . student Minor    12th grade at [employer]   Social History Main Topics  . Smoking status: Current Every Day Smoker    Packs/day: 0.50    Years: 0.00    Types: Cigarettes  . Smokeless tobacco: Never Used  . Alcohol use 3.6 oz/week    6 Cans of beer per week     Comment: "I actually recently quit"  . Drug use:     Types: Marijuana, Methamphetamines  . Sexual activity: Not Currently    Partners: Female     Comment: Pt reports that he is not sexually active   Other Topics Concern  . None   Social  History Narrative  . None   Additional Social History:    Pain Medications: None Prescriptions: Has not taken in 2 weeks. Over the Counter: None History of alcohol / drug use?: Yes Longest period of sobriety (when/how long): unknown Negative Consequences of Use: Financial, Legal Name of Substance 1: Marijuana 1 - Age of First Use: Teens 1 - Amount (size/oz): 2 blunts per episode 1 - Frequency: Once per week on average 1 - Duration: on-going 1 - Last Use / Amount: Two days ago                   Sleep: Fair  Appetite:  Fair  Current Medications: Current Facility-Administered Medications  Medication Dose Route Frequency Provider Last Rate Last Dose  . acetaminophen (TYLENOL) tablet 650 mg  650 mg Oral Q6H PRN Kerry HoughSpencer E Simon, PA-C   650 mg at 08/10/16 1112  . albuterol (PROVENTIL HFA;VENTOLIN HFA) 108 (90 Base) MCG/ACT inhaler 2 puff  2 puff Inhalation Q6H PRN Kerry HoughSpencer E Simon, PA-C   2 puff at 08/11/16 1848  . alum & mag hydroxide-simeth (MAALOX/MYLANTA) 200-200-20 MG/5ML suspension 30 mL  30 mL Oral Q4H PRN Kerry HoughSpencer E Simon, PA-C      . benzonatate (TESSALON) capsule 200 mg  200 mg Oral TID PRN Kerry HoughSpencer E Simon, PA-C   200 mg at 08/11/16 0135  . divalproex (DEPAKOTE ER) 24 hr tablet 750 mg  750 mg Oral Daily Neysa Hottereina Hisada, MD   750 mg at 08/11/16 0944  . hydrOXYzine (ATARAX/VISTARIL) tablet 25 mg  25 mg Oral Q6H PRN Kerry HoughSpencer E Simon, PA-C   25 mg at 08/08/16 2342  . ibuprofen (ADVIL,MOTRIN) tablet 600 mg  600 mg Oral Q6H PRN Kerry HoughSpencer E Simon, PA-C   600 mg at 08/10/16 2122  . magnesium hydroxide (MILK OF MAGNESIA) suspension 30 mL  30 mL Oral Daily PRN Kerry HoughSpencer E Simon, PA-C      . nicotine polacrilex (NICORETTE) gum 2 mg  2 mg Oral PRN Craige CottaFernando A Cobos, MD   2 mg at 08/11/16 1848  . QUEtiapine (SEROQUEL) tablet 50 mg  50 mg Oral Q8H PRN Neysa Hottereina Hisada, MD      . QUEtiapine (SEROQUEL) tablet 50 mg  50 mg Oral QHS Thresa RossNadeem Trent Gabler, MD        Lab Results:  Results for orders placed or performed during the hospital encounter of 08/08/16 (from the past 48 hour(s))  Lipid panel     Status: Abnormal   Collection Time: 08/11/16  6:33 AM  Result Value Ref Range   Cholesterol 181 0 - 200 mg/dL   Triglycerides 454116 <098<150 mg/dL   HDL 43 >11>40 mg/dL   Total CHOL/HDL Ratio 4.2 RATIO   VLDL 23 0 - 40 mg/dL   LDL Cholesterol 914115 (H) 0 - 99 mg/dL    Comment:        Total Cholesterol/HDL:CHD Risk Coronary Heart Disease Risk Table                     Men   Women  1/2 Average Risk    3.4   3.3  Average Risk       5.0   4.4  2 X Average Risk   9.6   7.1  3 X Average Risk  23.4   11.0        Use the calculated Patient Ratio above and the CHD Risk Table to determine the patient's CHD Risk.  ATP III CLASSIFICATION (LDL):  <100     mg/dL   Optimal  161-096  mg/dL   Near or Above                    Optimal  130-159  mg/dL   Borderline  045-409  mg/dL   High  >811     mg/dL   Very High Performed at [facility]   Hemoglobin A1c     Status: None   Collection Time: 08/11/16  6:33 AM  Result Value Ref Range   Hgb A1c MFr Bld 5.6 4.8 - 5.6 %    Comment: (NOTE)         Pre-diabetes: 5.7 - 6.4         Diabetes: >6.4         Glycemic control for adults with diabetes: <7.0    Mean Plasma Glucose 114 mg/dL    Comment: (NOTE) Performed At: [facility] [address] [place], [place] [id number] Mila Homer MD OZ:[id number] Performed at [facility]     Blood Alcohol level:  Lab Results  Component Value Date   [facility] <5 06/21/2016   ETH <5 05/01/2016    Metabolic Disorder Labs: Lab Results  Component Value Date   HGBA1C 5.6 08/11/2016   MPG 114 08/11/2016   MPG 117 08/10/2016   Lab Results  Component Value Date   PROLACTIN 65.2 (H) 08/09/2016   Lab Results  Component Value Date   CHOL 181 08/11/2016   TRIG 116 08/11/2016   HDL 43 08/11/2016   CHOLHDL 4.2 08/11/2016   VLDL 23 08/11/2016   LDLCALC 115 (H) 08/11/2016   LDLCALC 126 (H) 08/10/2016    Physical Findings: AIMS: Facial and Oral Movements Muscles of Facial Expression: None, normal Lips and Perioral Area: None, normal Jaw: None, normal Tongue: None, normal,Extremity Movements Upper (arms, wrists, hands, fingers): None, normal Lower (legs, knees, ankles, toes): None, normal, Trunk Movements Neck, shoulders, hips: None, normal, Overall Severity Severity of abnormal movements (highest score from questions above): None, normal Incapacitation due  to abnormal movements: None, normal Patient's awareness of abnormal movements (rate only patient's report): No Awareness, Dental Status Current problems with teeth and/or dentures?: No Does patient usually wear dentures?: No  CIWA:  CIWA-Ar Total: 0 COWS:  COWS Total Score: 1    Psychiatric Specialty Exam: Physical Exam  Constitutional: He appears well-developed and well-nourished.  Neurological: He is alert.    Review of Systems  Cardiovascular: Negative for chest pain and palpitations.  Psychiatric/Behavioral: Positive for depression. Negative for hallucinations, substance abuse and suicidal ideas. The patient has insomnia.   All other systems reviewed and are negative.   Blood pressure 132/78, pulse 78, temperature 98.9 F (37.2 C), resp. rate 20, height 5\' 9"  (1.753 m), weight 95.3 kg (210 lb), SpO2 97 %.Body mass index is 31.01 kg/m.  General Appearance: Fairly Groomed  Eye Contact:  Good  Speech:  Clear and Coherent  Volume:  Normal  Mood:  Improved.   Affect:  congruent  Thought Process:  Coherent and Goal Directed  Orientation:  Full (Time, Place, and Person)  Thought Content:  Logical Perceptions: denies AH/VH  Suicidal Thoughts:  No  Homicidal Thoughts:  No  Memory:  Immediate;   Good Recent;   Good Remote;   Good  Judgement:  Fair  Insight:  Fair  Psychomotor Activity:  Normal  Concentration:  Concentration: Good and Attention Span: Good  Recall:  Good  Fund of Knowledge:  Good  Language:  Good  Akathisia:  No  Handed:  Right  AIMS (if indicated):     Assets:  Communication Skills Desire for Improvement  ADL's:  Intact  Cognition:  WNL  Sleep:  Number of Hours: 6.25   Assessment Ryan Bartlett is a 20 year old male, with schizophrenia, bipolar disorder per report,  [facility] He came in with suicidal ideation with plan to hang self.   # unspecified mood disorder # r/o bipolar disorder #r/o substance induced mood  disorder Today's exam is notable for his euthymic affect, seroquel has helped along with depakote.  Will lower the dose to 50mg  qhs due to sedation effect.  Treatment Plan - Continue Depakote ER 750 mg daily (cosider monitoring level in 5 days) - Continue quetiapine 50mg  for at night. - Continue to hold cogentin, saphris  - Medication management to reduce current symptoms to base line and improve the patient's overall level of functioning. - Monitor for the adverse effect of the medications and anger outbursts - Continue 15 minutes observation for safety concerns - Encouraged to participate in milieu therapy and group therapy counseling sessions and also work with coping skills -  Develop treatment plan to decrease risk of relapse upon discharge and to reduce the need for readmission. -  Psycho-social education regarding relapse prevention and self care. - Health care follow up as needed for medical problems. - Restart home medications where appropriate.    Thresa RossAKHTAR, Ashonte Angelucci, MD 08/12/2016, 11:09 AM

## 2016-08-12 NOTE — Progress Notes (Signed)
Pt attended the evening AA speaker meeting. Pt was engaged and appropriate. Natania Finigan C, NT 08/12/16  10:07 PM  

## 2016-08-12 NOTE — Progress Notes (Signed)
Pt has been in the dayroom all evening sitting at the table interacting with peers and playing games.  He reports he had a good day.  He says he is still having passive suicidal thoughts that come and go, but he can contract for safety while on the unit.  He is jovial and social with his peers.  He makes his needs known to staff.  He has been polite and appropriate this evening.  Support and encouragement offered.  Discharge plans are in process.  Safety maintained with q15 minute checks.

## 2016-08-12 NOTE — Progress Notes (Signed)
Patient ID: Ryan Bartlett, male   DOB: 01-13-1996, 20 y.o.   MRN: [id number] D: Client seen in the dayroom interacting with peers and watching TV. Client is intrusive at times and seeks attention, but is easily redirected. Client reports coughing less, but getting up more pheglm with activity and hydration. A: Writer encouraged client to continue liquids. Medications reviewed, administered as ordered. Staff will monitor q6815min for safety. R: Clint is safe on the unit.

## 2016-08-12 NOTE — [facility] Group Notes (Signed)
Nurse Psycho-Educational Group centered on human needs and healthy support systems.  Patient attended the group and engaged actively.  

## 2016-08-13 MED ORDER — QUETIAPINE FUMARATE 100 MG PO TABS
100.0000 mg | ORAL_TABLET | Freq: Every day | ORAL | Status: DC
  Administered 2016-08-13 – 2016-08-16 (×4): 100 mg via ORAL
  Filled 2016-08-13 (×6): qty 1

## 2016-08-13 NOTE — Progress Notes (Signed)
Pt attended the evening AA speaker meeting. Pt appeared engaged and was appropriate. Ryan Bartlett, NT 08/13/16 10:34 PM 

## 2016-08-13 NOTE — Progress Notes (Signed)
D   Pt is irritable and has poor boundaries    He was rude to another caller when he answered the phone and almost got into an altercation with another patient    He acted like there was nothing wrong with what he did   A    Verbal support given   Medications administered and effectiveness monitored   Q 15 min checks   Redirect as needed R   Pt is safe at present

## 2016-08-13 NOTE — Progress Notes (Signed)
D: Patient's self inventory sheet: patient has fair sleep, recieved sleep medication.fair  Appetite, normal energy level, poor concentration. Rated depression 8/10, hopeless 8/10, anxiety 8/10. SI/HI/AVH: continues to endorse frequent thoughts of suicide. Physical complaints are denied. Goal is "call my best friend". Plans to work on "remind me".   A: Medications administered, assessed medication knowledge and education given on medication regimen.  Emotional support and encouragement given patient. R: Still reports  SI and denies  HI , contracts for safety. Safety maintained with 15 minute checks.

## 2016-08-13 NOTE — Progress Notes (Signed)
[facility] MD Progress Note  08/13/2016 11:51 AM Ryan Bartlett  MRN:  [id number] Subjective:  Patient reports " I don't think that minipress is helping me, I don't want to take this medication anymore. "   Objective: [name] is awake, alert and oriented *3. Seen resting in bed. Patient reports intermittent suicidal ideation, however is able to contract for safety.  Patient denies homicidal ideation. Denies auditory or visual hallucination and does not appear to be responding to internal stimuli. Patient reports he is medication compliant without mediation side effects. States his depression 7/10.  Reports good appetite. And reports he is resting poorly and waking up with nightmares about 3 am and is unable to go back to sleep. Support, encouragement and reassurance was provided.   Principal Problem: Bipolar 2 disorder (HCC) Diagnosis:   Patient Active Problem List   Diagnosis Date Noted  . Bipolar 2 disorder (HCC) [F31.81] 08/08/2016  . 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 Bipolar affective disorder, current episode depressed (HCC) [F31.30] 03/08/2016  . Bipolar affect, depressed (HCC) [F31.30] 09/18/2015  . Bipolar affective disorder, currently depressed, mild (HCC) [F31.31] 09/17/2015  . Intentional overdose of drug in tablet form (HCC) [T50.902A]   . Major depressive disorder, recurrent episode, moderate (HCC) [F33.1] 07/09/2015  . Depression [F32.9]   . Suicidal ideation [R45.851]   . Cannabis use disorder, moderate, dependence (HCC) [F12.20] 06/06/2015  . Alcohol use disorder, moderate, dependence (HCC) [F10.20] 06/06/2015  . Bipolar I disorder, most recent episode depressed (HCC) [F31.30] 05/23/2013  . Cannabis abuse [F12.10] 03/30/2012  . ADHD (attention deficit hyperactivity disorder), combined type [F90.2] 08/28/2011  . Conduct disorder, adolescent onset type [F91.2] 08/28/2011   Total Time spent with patient: 20 minutes  Past Psychiatric History:  Outpatient:  Dx: bipolar, schizoaffective, seen last month, [facility] interventions per chart Psychiatry admission: in [place] 05/2016, overdosing medication Previous suicide attempt: "a lot", last in 08/2015 by overdosing medication,other attempts including trying to hang himself in jail Past trials of medication: Depakote, risperidone, saphris, cogentin History of violence:  Legal: was in jail in 2016, pending charges for breakin and entry  Past Medical History:  Past Medical History:  Diagnosis Date  . ADHD (attention deficit hyperactivity disorder)   . Arthritis   . Asthma   . Bipolar disorder (HCC)   . Depression   . Oppositional defiant disorder   . Unspecified episodic mood disorder     Past Surgical History:  Procedure Laterality Date  . NO PAST SURGERIES     Family History:  Family History  Problem Relation Age of Onset  . Depression Mother   . Diabetes Other   . Hyperlipidemia Other   . Hypertension Other    Family Psychiatric  History: denies Social History:  History  Alcohol Use  . 3.6 oz/week  . 6 Cans of beer per week    Comment: "I actually recently quit"     History  Drug Use  . Types: Marijuana, Methamphetamines    Social History   Social History  . Marital status: Single    Spouse name: N/A  . Number of children: N/A  . Years of education: N/A   Occupational History  . student Minor    12th grade at [employer]   Social History Main Topics  . Smoking status: Current Every Day Smoker    Packs/day: 0.50    Years: 0.00    Types: Cigarettes  . Smokeless tobacco: Never Used  . Alcohol use 3.6  oz/week    6 Cans of beer per week     Comment: "I actually recently quit"  . Drug use:     Types: Marijuana, Methamphetamines  . Sexual activity: Not Currently    Partners: Female     Comment: Pt reports that he is not sexually active   Other Topics Concern  . None   Social History Narrative  . None   Additional Social History:    Pain Medications:  None Prescriptions: Has not taken in 2 weeks. Over the Counter: None History of alcohol / drug use?: Yes Longest period of sobriety (when/how long): unknown Negative Consequences of Use: Financial, Legal Name of Substance 1: Marijuana 1 - Age of First Use: Teens 1 - Amount (size/oz): 2 blunts per episode 1 - Frequency: Once per week on average 1 - Duration: on-going 1 - Last Use / Amount: Two days ago                  Sleep: Fair  Appetite:  Fair  Current Medications: Current Facility-Administered Medications  Medication Dose Route Frequency Provider Last Rate Last Dose  . acetaminophen (TYLENOL) tablet 650 mg  650 mg Oral Q6H PRN Kerry HoughSpencer E Simon, PA-C   650 mg at 08/10/16 1112  . albuterol (PROVENTIL HFA;VENTOLIN HFA) 108 (90 Base) MCG/ACT inhaler 2 puff  2 puff Inhalation Q6H PRN Kerry HoughSpencer E Simon, PA-C   2 puff at 08/11/16 1848  . alum & mag hydroxide-simeth (MAALOX/MYLANTA) 200-200-20 MG/5ML suspension 30 mL  30 mL Oral Q4H PRN Kerry HoughSpencer E Simon, PA-C      . benzonatate (TESSALON) capsule 200 mg  200 mg Oral TID PRN Kerry HoughSpencer E Simon, PA-C   200 mg at 08/11/16 0135  . divalproex (DEPAKOTE ER) 24 hr tablet 750 mg  750 mg Oral Daily Neysa Hottereina Hisada, MD   750 mg at 08/13/16 0734  . hydrOXYzine (ATARAX/VISTARIL) tablet 25 mg  25 mg Oral Q6H PRN Kerry HoughSpencer E Simon, PA-C   25 mg at 08/08/16 2342  . ibuprofen (ADVIL,MOTRIN) tablet 600 mg  600 mg Oral Q6H PRN Kerry HoughSpencer E Simon, PA-C   600 mg at 08/10/16 2122  . magnesium hydroxide (MILK OF MAGNESIA) suspension 30 mL  30 mL Oral Daily PRN Kerry HoughSpencer E Simon, PA-C      . nicotine polacrilex (NICORETTE) gum 2 mg  2 mg Oral PRN Craige CottaFernando A Reilley Latorre, MD   2 mg at 08/12/16 1553  . prazosin (MINIPRESS) capsule 1 mg  1 mg Oral QHS Oneta Rackanika N Lewis, NP   1 mg at 08/12/16 2214  . QUEtiapine (SEROQUEL) tablet 50 mg  50 mg Oral Q8H PRN Neysa Hottereina Hisada, MD      . QUEtiapine (SEROQUEL) tablet 50 mg  50 mg Oral QHS Thresa RossNadeem Akhtar, MD   50 mg at 08/12/16 2214    Lab  Results:  No results found for this or any previous visit (from the past 48 hour(s)).  Blood Alcohol level:  Lab Results  Component Value Date   [facility] 06/[facility]   ETH <5 05/01/2016    Metabolic Disorder Labs: Lab Results  Component Value Date   HGBA1C 5.6 08/11/2016   MPG 114 08/11/2016   MPG 117 08/10/2016   Lab Results  Component Value Date   PROLACTIN 65.2 (H) 08/09/2016   Lab Results  Component Value Date   CHOL 181 08/11/2016   TRIG 116 08/11/2016   HDL 43 08/11/2016   CHOLHDL 4.2 08/11/2016   VLDL 23 08/11/2016  LDLCALC 115 (H) 08/11/2016   LDLCALC 126 (H) 08/10/2016    Physical Findings: AIMS: Facial and Oral Movements Muscles of Facial Expression: None, normal Lips and Perioral Area: None, normal Jaw: None, normal Tongue: None, normal,Extremity Movements Upper (arms, wrists, hands, fingers): None, normal Lower (legs, knees, ankles, toes): None, normal, Trunk Movements Neck, shoulders, hips: None, normal, Overall Severity Severity of abnormal movements (highest score from questions above): None, normal Incapacitation due to abnormal movements: None, normal Patient's awareness of abnormal movements (rate only patient's report): No Awareness, Dental Status Current problems with teeth and/or dentures?: No Does patient usually wear dentures?: No  CIWA:  CIWA-Ar Total: 0 COWS:  COWS Total Score: 1    Psychiatric Specialty Exam: Physical Exam  Constitutional: He appears well-developed and well-nourished.  Neurological: He is alert.    Review of Systems  Cardiovascular: Negative for chest pain and palpitations.  Psychiatric/Behavioral: Positive for depression. Negative for hallucinations, substance abuse and suicidal ideas. The patient has insomnia.   All other systems reviewed and are negative.   Blood pressure 111/64, pulse 86, temperature 97.6 F (36.4 C), temperature source Oral, resp. rate 18, height 5\' 9"  (1.753 m), weight 95.3 kg (210 lb), SpO2  97 %.Body mass index is 31.01 kg/m.  General Appearance: Fairly Groomed  Eye Contact:  Good  Speech:  Clear and Coherent  Volume:  Normal  Mood:  Improved.   Affect:  congruent  Thought Process:  Coherent and Goal Directed  Orientation:  Full (Time, Place, and Person)  Thought Content:  Logical Perceptions: denies AH/VH  Suicidal Thoughts:  No  Homicidal Thoughts:  No  Memory:  Immediate;   Good Recent;   Good Remote;   Good  Judgement:  Fair  Insight:  Fair  Psychomotor Activity:  Normal  Concentration:  Concentration: Good and Attention Span: Good  Recall:  Good  Fund of Knowledge:  Good  Language:  Good  Akathisia:  No  Handed:  Right  AIMS (if indicated):     Assets:  Communication Skills Desire for Improvement  ADL's:  Intact  Cognition:  WNL  Sleep:  Number of Hours: 6     I agree with current treatment plan on 08/13/2016, Patient seen face-to-face for psychiatric evaluation follow-up, chart reviewed.  Reviewed the information documented and agree with the treatment plan.  Treatment Plan - Continue Depakote ER 750 mg daily (consider monitoring level in 5 days)- valproic level placed 08/13/2016 - increased  quetiapine 50 mg to 100 mg  for at night. - Discontinue minipress 1 mg   Continue to hold cogentin, saphris  - Medication management to reduce current symptoms to base line and improve the patient's overall level of functioning. - Monitor for the adverse effect of the medications and anger outbursts - Continue 15 minutes observation for safety concerns - Encouraged to participate in milieu therapy and group therapy counseling sessions and also work with coping skills -  Develop treatment plan to decrease risk of relapse upon discharge and to reduce the need for readmission. -  Psycho-social education regarding relapse prevention and self care. - Health care follow up as needed for medical problems. - Restart home medications where appropriate.  Oneta Rackanika N  Lewis, NP 08/13/2016, 11:51 AM   Agree with NP note as above

## 2016-08-14 LAB — VALPROIC ACID LEVEL: Valproic Acid Lvl: 38 ug/mL — ABNORMAL LOW (ref 50.0–100.0)

## 2016-08-14 MED ORDER — DIVALPROEX SODIUM ER 500 MG PO TB24
1000.0000 mg | ORAL_TABLET | Freq: Every day | ORAL | Status: DC
  Administered 2016-08-15 – 2016-08-16 (×2): 1000 mg via ORAL
  Filled 2016-08-14 (×4): qty 2

## 2016-08-14 NOTE — Progress Notes (Signed)
CSW attempted to contact pt's ACT Team ([facility]: [phone]) to inform them of pt's hospitalization. No answer/no voicemail-will attempt again in the morning.  Trula SladeHeather Smart, MSW, [name] Clinical Social Worker 08/14/2016 3:27 PM

## 2016-08-14 NOTE — Plan of Care (Signed)
Problem: Education: Goal: Knowledge of the prescribed therapeutic regimen will improve Outcome: Not Progressing Patient does not come to the nurses station for his medication without multiple requests from staff. When patient does come he takes his medications, he does so  without difficulty

## 2016-08-14 NOTE — Progress Notes (Signed)
[facility] MD Progress Note  08/14/2016 6:18 PM Ryan Bartlett  MRN:  [id number] Subjective:  Patient reports he continues to feel " tired ". Reports nightmares but is unsure if they are medication related , as he was having nightmares even prior to admission. At this time denies suicidal ideations and is future oriented, states he is planning on " trying to live independently- my ACT team is going to help me find a place ".   Objective:  I have discussed case with treatment team and have met with patient . Patient reports feeling partially improved , but continues to report feeling " tired ", anxious. At this time does not endorse severe depression, denies suicidal ideations , and is future oriented- as above, focusing on being able to live independently after discharge. Denies medication side effects- reports  some nightmares, but denies them being related to prior traumatic experiences and at this time does not endorse PTSD symptoms, states " they are just weird dreams". As per staff patient is starting to be more interactive in milieu, more redirectable . No disruptive or agitated behaviors  Valproic Acid Serum level is subtherapeutic - 38.  Principal Problem: Bipolar 2 disorder ([place]) Diagnosis:   Patient Active Problem List   Diagnosis Date Noted  . Bipolar 2 disorder ([place]) [F31.81] 08/08/2016  . 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 Bipolar affective disorder, current episode depressed ([place]) [F31.30] 03/08/2016  . Bipolar affect, depressed ([place]) [F31.30] 09/18/2015  . Bipolar affective disorder, currently depressed, mild ([place]) [F31.31] 09/17/2015  . Intentional overdose of drug in tablet form ([place]) [T50.902A]   . Major depressive disorder, recurrent episode, moderate ([place]) [F33.1] 07/09/2015  . Depression [F32.9]   . Suicidal ideation [R45.851]   . Cannabis use disorder, moderate, dependence ([place]) [F12.20] 06/06/2015  . Alcohol use disorder, moderate, dependence ([place]) [F10.20]  06/06/2015  . Bipolar I disorder, most recent episode depressed ([place]) [F31.30] 05/23/2013  . Cannabis abuse [F12.10] 03/30/2012  . ADHD (attention deficit hyperactivity disorder), combined type [F90.2] 08/28/2011  . Conduct disorder, adolescent onset type [F91.2] 08/28/2011   Total Time spent with patient: 20 minutes  Past Psychiatric History:  Outpatient: Dx: bipolar, schizoaffective, seen last month, [facility] Psychiatry admission: in [place] 05/2016, overdosing medication Previous suicide attempt: "a lot", last in 08/2015 by overdosing medication,other attempts including trying to hang himself in jail Past trials of medication: Depakote, risperidone, saphris, cogentin History of violence:  Legal: was in jail in 2016, pending charges for breakin and entry  Past Medical History:  Past Medical History:  Diagnosis Date  . ADHD (attention deficit hyperactivity disorder)   . Arthritis   . Asthma   . Bipolar disorder ([place])   . Depression   . Oppositional defiant disorder   . Unspecified episodic mood disorder     Past Surgical History:  Procedure Laterality Date  . NO PAST SURGERIES     Family History:  Family History  Problem Relation Age of Onset  . Depression Mother   . Diabetes Other   . Hyperlipidemia Other   . Hypertension Other    Family Psychiatric  History: denies Social History:  History  Alcohol Use  . 3.6 oz/week  . 6 Cans of beer per week    Comment: "I actually recently quit"     History  Drug Use  . Types: Marijuana, Methamphetamines    Social History   Social History  . Marital status: Single    Spouse name: N/A  .  Number of children: N/A  . Years of education: N/A   Occupational History  . student Minor    12th grade at [employer] Topics  . Smoking status: Current Every Day Smoker    Packs/day: 0.50    Years: 0.00    Types: Cigarettes  . Smokeless tobacco: Never Used  . Alcohol use 3.6  oz/week    6 Cans of beer per week     Comment: "I actually recently quit"  . Drug use:     Types: Marijuana, Methamphetamines  . Sexual activity: Not Currently    Partners: Female     Comment: Pt reports that he is not sexually active   Other Topics Concern  . None   Social History Narrative  . None   Additional Social History:    Pain Medications: None Prescriptions: Has not taken in 2 weeks. Over the Counter: None History of alcohol / drug use?: Yes Longest period of sobriety (when/how long): unknown Negative Consequences of Use: Financial, Legal Name of Substance 1: Marijuana 1 - Age of First Use: Teens 1 - Amount (size/oz): 2 blunts per episode 1 - Frequency: Once per week on average 1 - Duration: on-going 1 - Last Use / Amount: Two days ago  Sleep: improving   Appetite: improving   Current Medications: Current Facility-Administered Medications  Medication Dose Route Frequency Provider Last Rate Last Dose  . acetaminophen (TYLENOL) tablet 650 mg  650 mg Oral Q6H PRN Laverle Hobby, PA-C   650 mg at 08/10/16 1112  . albuterol (PROVENTIL HFA;VENTOLIN HFA) 108 (90 Base) MCG/ACT inhaler 2 puff  2 puff Inhalation Q6H PRN Laverle Hobby, PA-C   2 puff at 08/11/16 1848  . alum & mag hydroxide-simeth (MAALOX/MYLANTA) 200-200-20 MG/5ML suspension 30 mL  30 mL Oral Q4H PRN Laverle Hobby, PA-C      . benzonatate (TESSALON) capsule 200 mg  200 mg Oral TID PRN Laverle Hobby, PA-C   200 mg at 08/11/16 0135  . divalproex (DEPAKOTE ER) 24 hr tablet 750 mg  750 mg Oral Daily Norman Clay, MD   750 mg at 08/14/16 1114  . hydrOXYzine (ATARAX/VISTARIL) tablet 25 mg  25 mg Oral Q6H PRN Laverle Hobby, PA-C   25 mg at 08/13/16 2256  . ibuprofen (ADVIL,MOTRIN) tablet 600 mg  600 mg Oral Q6H PRN Laverle Hobby, PA-C   600 mg at 08/13/16 1210  . magnesium hydroxide (MILK OF MAGNESIA) suspension 30 mL  30 mL Oral Daily PRN Laverle Hobby, PA-C      . nicotine polacrilex (NICORETTE)  gum 2 mg  2 mg Oral PRN Jenne Campus, MD   2 mg at 08/14/16 1327  . QUEtiapine (SEROQUEL) tablet 100 mg  100 mg Oral QHS Derrill Center, NP   100 mg at 08/13/16 2256  . QUEtiapine (SEROQUEL) tablet 50 mg  50 mg Oral Q8H PRN Norman Clay, MD        Lab Results:  Results for orders placed or performed during the hospital encounter of 08/08/16 (from the past 48 hour(s))  Valproic acid level     Status: Abnormal   Collection Time: 08/14/16  6:18 AM  Result Value Ref Range   Valproic Acid Lvl 38 (L) 50.0 - 100.0 ug/mL    Comment: Performed at [facility]    Blood Alcohol level:  Lab Results  Component Value Date   [facility] <5 06/21/2016  ETH <5 50/53/9767    Metabolic Disorder Labs: Lab Results  Component Value Date   HGBA1C 5.6 08/11/2016   MPG 114 08/11/2016   MPG 117 08/10/2016   Lab Results  Component Value Date   PROLACTIN 65.2 (H) 08/09/2016   Lab Results  Component Value Date   CHOL 181 08/11/2016   TRIG 116 08/11/2016   HDL 43 08/11/2016   CHOLHDL 4.2 08/11/2016   VLDL 23 08/11/2016   LDLCALC 115 (H) 08/11/2016   LDLCALC 126 (H) 08/10/2016    Physical Findings: AIMS: Facial and Oral Movements Muscles of Facial Expression: None, normal Lips and Perioral Area: None, normal Jaw: None, normal Tongue: None, normal,Extremity Movements Upper (arms, wrists, hands, fingers): None, normal Lower (legs, knees, ankles, toes): None, normal, Trunk Movements Neck, shoulders, hips: None, normal, Overall Severity Severity of abnormal movements (highest score from questions above): None, normal Incapacitation due to abnormal movements: None, normal Patient's awareness of abnormal movements (rate only patient's report): No Awareness, Dental Status Current problems with teeth and/or dentures?: No Does patient usually wear dentures?: No  CIWA:  CIWA-Ar Total: 0 COWS:  COWS Total Score: 1    Psychiatric Specialty Exam: Physical Exam  Constitutional: He  appears well-developed and well-nourished.  Neurological: He is alert.    Review of Systems  Cardiovascular: Negative for chest pain and palpitations.  Psychiatric/Behavioral: Positive for depression. Negative for hallucinations, substance abuse and suicidal ideas. The patient has insomnia.   All other systems reviewed and are negative.   Blood pressure 132/71, pulse 72, temperature 97.8 F (36.6 C), temperature source Oral, resp. rate 18, height '5\' 9"'$  (1.753 m), weight 95.3 kg (210 lb), SpO2 97 %.Body mass index is 31.01 kg/m.  General Appearance: Fairly Groomed  Eye Contact:  Good  Speech:  Clear and Coherent  Volume:  Normal  Mood: improving    Affect:  Vaguely anxious, reactive, does smile briefly at times   Thought Process:  Linear  Orientation:  Full (Time, Place, and Person)  Thought Content: denies hallucinations , no delusions expressed   Suicidal Thoughts:  No- denies suicidal or self injurious ideations, denies any homicidal or violent ideations  Homicidal Thoughts:  No  Memory:  Immediate;   Good Recent;   Good Remote;   Good  Judgement:  Improving   Insight:  Fair  Psychomotor Activity:  Normal  Concentration:  Concentration: Good and Attention Span: Good  Recall:  Good  Fund of Knowledge:  Good  Language:  Good  Akathisia:  No  Handed:  Right  AIMS (if indicated):     Assets:  Communication Skills Desire for Improvement  ADL's:  Intact  Cognition:  WNL  Sleep:  Number of Hours: 5.75   Assessment - patient is presenting with partial improvement in mood , affect, and better behavioral control, more interactive in milieu/groups. He is future oriented and denies any suicidal ideations. At this time patient denies medication side effects . Of note, valproic acid serum level is subtherapeutic .  Treatment Plan -Encourage ongoing group and milieu participation to work on coping skills and symptom reduction - Increase  Depakote ER to 1000 mg QDAY for mood disorder   - Continue Quetiapine  100 mg  QHS for mood disorder, psychotic symptoms  -Continue Vistaril 245 mgrs Q 6 hours PRN for anxiety - Treatment team working on disposition planning options   Neita Garnet, MD 08/14/2016, 6:18 PM Patient ID: Ryan Bartlett, male   DOB: 23-Mar-1996, 20 y.o.  MRN: [id number]

## 2016-08-14 NOTE — Progress Notes (Signed)
Patient did attend the evening speaker AA meeting.  

## 2016-08-14 NOTE — Tx Team (Signed)
Interdisciplinary Treatment and Diagnostic Plan Update  08/14/2016 Time of Session: 9:30AM ALICIA ACKERT MRN: [id number]  Principal Diagnosis: Bipolar 2 disorder [facility])  Secondary Diagnoses: Principal Problem:   Bipolar 2 disorder (HCC)   Current Medications:  Current Facility-Administered Medications  Medication Dose Route Frequency Provider Last Rate Last Dose  . acetaminophen (TYLENOL) tablet 650 mg  650 mg Oral Q6H PRN Kerry Hough, PA-C   650 mg at 08/10/16 1112  . albuterol (PROVENTIL HFA;VENTOLIN HFA) 108 (90 Base) MCG/ACT inhaler 2 puff  2 puff Inhalation Q6H PRN Kerry Hough, PA-C   2 puff at 08/11/16 1848  . alum & mag hydroxide-simeth (MAALOX/MYLANTA) 200-200-20 MG/5ML suspension 30 mL  30 mL Oral Q4H PRN Kerry Hough, PA-C      . benzonatate (TESSALON) capsule 200 mg  200 mg Oral TID PRN Kerry Hough, PA-C   200 mg at 08/11/16 0135  . divalproex (DEPAKOTE ER) 24 hr tablet 750 mg  750 mg Oral Daily Neysa Hotter, MD   750 mg at 08/14/16 1114  . hydrOXYzine (ATARAX/VISTARIL) tablet 25 mg  25 mg Oral Q6H PRN Kerry Hough, PA-C   25 mg at 08/13/16 2256  . ibuprofen (ADVIL,MOTRIN) tablet 600 mg  600 mg Oral Q6H PRN Kerry Hough, PA-C   600 mg at 08/13/16 1210  . magnesium hydroxide (MILK OF MAGNESIA) suspension 30 mL  30 mL Oral Daily PRN Kerry Hough, PA-C      . nicotine polacrilex (NICORETTE) gum 2 mg  2 mg Oral PRN Craige Cotta, MD   2 mg at 08/13/16 2000  . QUEtiapine (SEROQUEL) tablet 100 mg  100 mg Oral QHS Oneta Rack, NP   100 mg at 08/13/16 2256  . QUEtiapine (SEROQUEL) tablet 50 mg  50 mg Oral Q8H PRN Neysa Hotter, MD       PTA Medications: Prescriptions Prior to Admission  Medication Sig Dispense Refill Last Dose  . albuterol (PROVENTIL HFA;VENTOLIN HFA) 108 (90 Base) MCG/ACT inhaler Inhale 2 puffs into the lungs every 6 (six) hours as needed for wheezing or shortness of breath.   Past Month at Unknown time  . benzonatate (TESSALON) 100  MG capsule Take 2 capsules (200 mg total) by mouth 2 (two) times daily as needed for cough. 20 capsule 0   . benztropine (COGENTIN) 1 MG tablet Take 1 mg by mouth 2 (two) times daily.   06/19/2016 at Unknown time  . divalproex (DEPAKOTE ER) 500 MG 24 hr tablet Take 500 mg by mouth 2 (two) times daily.   06/19/2016 at Unknown time  . ibuprofen (ADVIL,MOTRIN) 600 MG tablet Take 1 tablet (600 mg total) by mouth every 6 (six) hours as needed. 30 tablet 0   . oxymetazoline (AFRIN NASAL SPRAY) 0.05 % nasal spray Place 1 spray into both nostrils 2 (two) times daily. Spray once into each nostril twice daily for up to the next 3 days. Do not use for more than 3 days to prevent rebound rhinorrhea. 30 mL 0   . QUEtiapine (SEROQUEL) 100 MG tablet Take 1 tablet (100 mg total) by mouth at bedtime. (Patient not taking: Reported on 06/20/2016) 30 tablet 0 Not Taking at Unknown time  . risperiDONE (RISPERDAL) 3 MG tablet Take 3 mg by mouth at bedtime.   06/19/2016 at Unknown time    Patient Stressors: Financial difficulties Legal issue Medication change or noncompliance Substance abuse  Patient Strengths: Capable of independent living Wellsite geologist fund of  knowledge Motivation for treatment/growth Physical Health  Treatment Modalities: Medication Management, Group therapy, Case management,  1 to 1 session with clinician, Psychoeducation, Recreational therapy.   Physician Treatment Plan for Primary Diagnosis: Bipolar 2 disorder (HCC) Long Term Goal(s): Improvement in symptoms so as ready for discharge Improvement in symptoms so as ready for discharge   Short Term Goals: Ability to demonstrate self-control will improve Ability to identify and develop effective coping behaviors will improve Ability to demonstrate self-control will improve Ability to identify and develop effective coping behaviors will improve  Medication Management: Evaluate patient's response, side effects, and tolerance  of medication regimen.  Therapeutic Interventions: 1 to 1 sessions, Unit Group sessions and Medication administration.  Evaluation of Outcomes: Progressing  Physician Treatment Plan for Secondary Diagnosis: Principal Problem:   Bipolar 2 disorder (HCC)  Long Term Goal(s): Improvement in symptoms so as ready for discharge Improvement in symptoms so as ready for discharge   Short Term Goals: Ability to demonstrate self-control will improve Ability to identify and develop effective coping behaviors will improve Ability to demonstrate self-control will improve Ability to identify and develop effective coping behaviors will improve     Medication Management: Evaluate patient's response, side effects, and tolerance of medication regimen.  Therapeutic Interventions: 1 to 1 sessions, Unit Group sessions and Medication administration.  Evaluation of Outcomes: Progressing   RN Treatment Plan for Primary Diagnosis: Bipolar 2 disorder (HCC) Long Term Goal(s): Knowledge of disease and therapeutic regimen to maintain health will improve  Short Term Goals: Ability to remain free from injury will improve  Medication Management: RN will administer medications as ordered by provider, will assess and evaluate patient's response and provide education to patient for prescribed medication. RN will report any adverse and/or side effects to prescribing provider.  Therapeutic Interventions: 1 on 1 counseling sessions, Psychoeducation, Medication administration, Evaluate responses to treatment, Monitor vital signs and CBGs as ordered, Perform/monitor CIWA, COWS, AIMS and Fall Risk screenings as ordered, Perform wound care treatments as ordered.  Evaluation of Outcomes: Progressing   [name] Treatment Plan for Primary Diagnosis: Bipolar 2 disorder (HCC) Long Term Goal(s): Safe transition to appropriate next level of care at discharge, Engage patient in therapeutic group addressing interpersonal  concerns.  Short Term Goals: Engage patient in aftercare planning with referrals and resources, Facilitate patient progression through stages of change regarding substance use diagnoses and concerns and Identify triggers associated with mental health/substance abuse issues  Therapeutic Interventions: Assess for all discharge needs, 1 to 1 time with Social worker, Explore available resources and support systems, Assess for adequacy in community support network, Educate family and significant other(s) on suicide prevention, Complete Psychosocial Assessment, Interpersonal group therapy.  Evaluation of Outcomes: Progressing   Progress in Treatment: Attending groups: No. New to unit. Continuing to assess.  Participating in groups: No. Taking medication as prescribed: Yes. Toleration medication: Yes. Family/Significant other contact made: SPE completed with pt; pt declines to consent to family contact.  Patient understands diagnosis: Yes. Discussing patient identified problems/goals with staff: Yes. Medical problems stabilized or resolved: Yes. Denies suicidal/homicidal ideation: Yes. Currently, pt has denied SI today. No HI.  Issues/concerns per patient self-inventory: No. Other: n/a   New problem(s) identified: No, Describe:  n/a  New Short Term/Long Term Goal(s): mood stabilization/medication management/elimination of SI thoughts/development of comprehensive mental wellness/sobriety plan.   Discharge Plan or Barriers: CSW assessing for appropriate referrals-left message for [facility] to follow-up with them. Pt was last set up with [facility] for  followup in Jan 2017. [place].   Reason for Continuation of Hospitalization: Depression Medication stabilization  Estimated Length of Stay: 1-3 days   Attendees: Patient: 08/14/2016 11:41 AM  Physician: Dr. Jama Flavorsobos; Dr. Vanetta ShawlHisada RN 08/14/2016 11:41 AM   Nursing:Penny, Philippa ChesterBrittney T. RN 08/14/2016 11:41 AM  RN Care Manager:  08/14/2016 11:41 AM  Social Worker: Herbert SetaHeather Smart, [name]; Chad CordialLauren Carter [name] 08/14/2016 11:41 AM  Recreational Therapist:  08/14/2016 11:41 AM  Other: Armandina StammerAgnes Nwoko NP 08/14/2016 11:41 AM  Other:  08/14/2016 11:41 AM  Other: 08/14/2016 11:41 AM    Scribe for Treatment Team: Ledell PeoplesHeather N Smart, [name] 08/14/2016 11:41 AM

## 2016-08-14 NOTE — Progress Notes (Signed)
D    Pt is calm and cooperative    He paces the halls and can be intrusive with loose boundaries   He redirects well and is compliant with treatment A    Verbal support given   Medications administered and effectiveness monitored   Q 15 min checks R   Pt is safe at present

## 2016-08-14 NOTE — Progress Notes (Signed)
Recreation Therapy Notes  Animal-Assisted Activity (AAA) Program Checklist/Progress Notes Patient Eligibility Criteria Checklist & Daily Group note for Rec TxIntervention  Date: 12.26.2017 Time: 2:45pm [address]    AAA/T Program Assumption of Risk Form signed by Patient/ or Parent Legal Guardian Yes  Patient is free of allergies or sever asthma Yes  Patient reports no fear of animals Yes  Patient reports no history of cruelty to animals Yes  Patient understands his/her participation is voluntary Yes  Patient washes hands before animal contact Yes  Patient washes hands after animal contact Yes  Behavioral Response: Appropriate   Education:Hand Washing, Appropriate Animal Interaction   Education Outcome: Acknowledges education.   Clinical Observations/Feedback: Patient attended session and interacted appropriately with therapy dog and peers. Patient asked appropriate questions about therapy dog and his training. Patient shared stories about their pets at home with group.   Marykay Lexenise L Ayesha Markwell, LRT/CTRS  Jearl KlinefelterBlanchfield, Antar Milks L 08/14/2016 2:56 PM
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
## 2016-08-14 NOTE — Progress Notes (Signed)
Data. Patient denies HI/AVH. Patient continues to endorse passive SI without a plan, "It just goes through my mind". Patient is able to  verbally contract for safety on the unit and to come to staff before acting on any self harm thoughts/feelings. Patient isolating in his room most of shift, napping in bed. He was encouraged multiple times to come and get his medications, but did not do so until 11:14am. Affect bright with interaction and patient has no complaints through out shift. Patient did not complete his self assessment evan after staff encouragement. Action. Emotional support and encouragement offered. Education provided on medication, indications and side effect. Q 15 minute checks done for safety. Response. Safety on the unit maintained through 15 minute checks.  Medications taken as prescribed. Remained calm through out shift.

## 2016-08-14 NOTE — [facility] Suicide Risk Assessment (Signed)
[facility] INPATIENT:  Family/Significant Other Suicide Prevention Education  Suicide Prevention Education:  Patient Refusal for Family/Significant Other Suicide Prevention Education: The patient Ryan Bartlett has refused to provide written consent for family/significant other to be provided Family/Significant Other Suicide Prevention Education during admission and/or prior to discharge.  Physician notified.   SPE completed with pt, as pt refused to consent to family contact. SPI pamphlet provided to pt and pt was encouraged to share information with support network, ask questions, and talk about any concerns relating to SPE. Pt denies access to guns/firearms and verbalized understanding of information provided. Mobile Crisis information also provided to pt.   Tyrian Peart N Smart [name] 08/14/2016, 11:40 AM

## 2016-08-15 NOTE — Progress Notes (Signed)
Patient attended AA group meeting.  

## 2016-08-15 NOTE — Plan of Care (Signed)
Problem: Activity: Goal: Imbalance in normal sleep/wake cycle will improve Outcome: Not Progressing Patient continues to sleep in late and report after he awakens that he is still tired

## 2016-08-15 NOTE — Progress Notes (Signed)
[facility]  08/15/2016 9:47 AM Ryan Bartlett  MRN:  [id number] Subjective:  "I feel a lot better today. I know I'm doing better on the meds and going to groups."   Objective: Pt seen and chart reviewed. Pt is alert/oriented x4, calm, cooperative, and appropriate to situation. Pt denies suicidal/homicidal ideation and psychosis and does not appear to be responding to internal stimuli. Pt reports feeling much more stable on current medication regimen which has been stable for the past few days. Pt is on the discharge list for tomorrow. He reports that he feels stable to go home as long as his resources are in place.   Principal Problem: Bipolar 2 disorder (HCC) Diagnosis:   Patient Active Problem List   Diagnosis Date Noted  . Bipolar 2 disorder (HCC) [F31.81] 08/08/2016  . 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 Bipolar affective disorder, current episode depressed (HCC) [F31.30] 03/08/2016  . Bipolar affect, depressed (HCC) [F31.30] 09/18/2015  . Bipolar affective disorder, currently depressed, mild (HCC) [F31.31] 09/17/2015  . Intentional overdose of drug in tablet form (HCC) [T50.902A]   . Major depressive disorder, recurrent episode, moderate (HCC) [F33.1] 07/09/2015  . Depression [F32.9]   . Suicidal ideation [R45.851]   . Cannabis use disorder, moderate, dependence (HCC) [F12.20] 06/06/2015  . Alcohol use disorder, moderate, dependence (HCC) [F10.20] 06/06/2015  . Bipolar I disorder, most recent episode depressed (HCC) [F31.30] 05/23/2013  . Cannabis abuse [F12.10] 03/30/2012  . ADHD (attention deficit hyperactivity disorder), combined type [F90.2] 08/28/2011  . Conduct disorder, adolescent onset type [F91.2] 08/28/2011   Total Time spent with patient: 15 minutes  Past Psychiatric History:  Outpatient: Dx: bipolar, schizoaffective, seen last month, [facility] per chart Psychiatry admission: in [place] 05/2016, overdosing medication Previous  suicide attempt: "a lot", last in 08/2015 by overdosing medication,other attempts including trying to hang himself in jail Past trials of medication: Depakote, risperidone, saphris, cogentin History of violence:  Legal: was in jail in 2016, pending charges for breakin and entry  Past Medical History:  Past Medical History:  Diagnosis Date  . ADHD (attention deficit hyperactivity disorder)   . Arthritis   . Asthma   . Bipolar disorder (HCC)   . Depression   . Oppositional defiant disorder   . Unspecified episodic mood disorder     Past Surgical History:  Procedure Laterality Date  . NO PAST SURGERIES     Family History:  Family History  Problem Relation Age of Onset  . Depression Mother   . Diabetes Other   . Hyperlipidemia Other   . Hypertension Other    Family Psychiatric  History: denies Social History:  History  Alcohol Use  . 3.6 oz/week  . 6 Cans of beer per week    Comment: "I actually recently quit"     History  Drug Use  . Types: Marijuana, Methamphetamines    Social History   Social History  . Marital status: Single    Spouse name: N/A  . Number of children: N/A  . Years of education: N/A   Occupational History  . student Minor    12th grade at [employer]   Social History Main Topics  . Smoking status: Current Every Day Smoker    Packs/day: 0.50    Years: 0.00    Types: Cigarettes  . Smokeless tobacco: Never Used  . Alcohol use 3.6 oz/week    6 Cans of beer per week     Comment: "I actually recently  quit"  . Drug use:     Types: Marijuana, Methamphetamines  . Sexual activity: Not Currently    Partners: Female     Comment: Pt reports that he is not sexually active   Other Topics Concern  . None   Social History Narrative  . None   Additional Social History:    Pain Medications: None Prescriptions: Has not taken in 2 weeks. Over the Counter: None History of alcohol / drug use?: Yes Longest period of sobriety (when/how long):  unknown Negative Consequences of Use: Financial, Legal Name of Substance 1: Marijuana 1 - Age of First Use: Teens 1 - Amount (size/oz): 2 blunts per episode 1 - Frequency: Once per week on average 1 - Duration: on-going 1 - Last Use / Amount: Two days ago  Sleep: improving   Appetite: improving   Current Medications: Current Facility-Administered Medications  Medication Dose Route Frequency Provider Last Rate Last Dose  . acetaminophen (TYLENOL) tablet 650 mg  650 mg Oral Q6H PRN Kerry Hough, PA-C   650 mg at 08/10/16 1112  . albuterol (PROVENTIL HFA;VENTOLIN HFA) 108 (90 Base) MCG/ACT inhaler 2 puff  2 puff Inhalation Q6H PRN Kerry Hough, PA-C   2 puff at 08/11/16 1848  . alum & mag hydroxide-simeth (MAALOX/MYLANTA) 200-200-20 MG/5ML suspension 30 mL  30 mL Oral Q4H PRN Kerry Hough, PA-C      . benzonatate (TESSALON) capsule 200 mg  200 mg Oral TID PRN Kerry Hough, PA-C   200 mg at 08/11/16 0135  . divalproex (DEPAKOTE ER) 24 hr tablet 1,000 mg  1,000 mg Oral Daily Rockey Situ Cobos, MD      . hydrOXYzine (ATARAX/VISTARIL) tablet 25 mg  25 mg Oral Q6H PRN Kerry Hough, PA-C   25 mg at 08/14/16 2230  . ibuprofen (ADVIL,MOTRIN) tablet 600 mg  600 mg Oral Q6H PRN Kerry Hough, PA-C   600 mg at 08/13/16 1210  . magnesium hydroxide (MILK OF MAGNESIA) suspension 30 mL  30 mL Oral Daily PRN Kerry Hough, PA-C      . nicotine polacrilex (NICORETTE) gum 2 mg  2 mg Oral PRN Craige Cotta, MD   2 mg at 08/14/16 1821  . QUEtiapine (SEROQUEL) tablet 100 mg  100 mg Oral QHS Oneta Rack, NP   100 mg at 08/14/16 2231  . QUEtiapine (SEROQUEL) tablet 50 mg  50 mg Oral Q8H PRN Neysa Hotter, MD        Lab Results:  Results for orders placed or performed during the hospital encounter of 08/08/16 (from the past 48 hour(s))  Valproic acid level     Status: Abnormal   Collection Time: 08/14/16  6:18 AM  Result Value Ref Range   Valproic Acid Lvl 38 (L) 50.0 - 100.0 ug/mL     Comment: Performed at [facility]    Blood Alcohol level:  Lab Results  Component Value Date   [facility] <5 06/21/2016   ETH <5 05/01/2016    Metabolic Disorder Labs: Lab Results  Component Value Date   HGBA1C 5.6 08/11/2016   MPG 114 08/11/2016   MPG 117 08/10/2016   Lab Results  Component Value Date   PROLACTIN 65.2 (H) 08/09/2016   Lab Results  Component Value Date   CHOL 181 08/11/2016   TRIG 116 08/11/2016   HDL 43 08/11/2016   CHOLHDL 4.2 08/11/2016   VLDL 23 08/11/2016   LDLCALC 115 (H) 08/11/2016   LDLCALC  126 (H) 08/10/2016    Physical Findings: AIMS: Facial and Oral Movements Muscles of Facial Expression: None, normal Lips and Perioral Area: None, normal Jaw: None, normal Tongue: None, normal,Extremity Movements Upper (arms, wrists, hands, fingers): None, normal Lower (legs, knees, ankles, toes): None, normal, Trunk Movements Neck, shoulders, hips: None, normal, Overall Severity Severity of abnormal movements (highest score from questions above): None, normal Incapacitation due to abnormal movements: None, normal Patient's awareness of abnormal movements (rate only patient's report): No Awareness, Dental Status Current problems with teeth and/or dentures?: No Does patient usually wear dentures?: No  CIWA:  CIWA-Ar Total: 0 COWS:  COWS Total Score: 1    Psychiatric Specialty Exam: Physical Exam  Constitutional: He appears well-developed and well-nourished.  Neurological: He is alert.    Review of Systems  Cardiovascular: Negative for chest pain and palpitations.  Psychiatric/Behavioral: Positive for depression. Negative for hallucinations, substance abuse and suicidal ideas. The patient has insomnia.   All other systems reviewed and are negative.   Blood pressure 132/71, pulse 72, temperature 97.8 F (36.6 C), temperature source Oral, resp. rate 18, height 5\' 9"  (1.753 m), weight 95.3 kg (210 lb), SpO2 97 %.Body mass index is 31.01  kg/m.  General Appearance: Casual and Fairly Groomed  Eye Contact:  Good  Speech:  Clear and Coherent, normal rate  Volume:  Normal  Mood: euthymic, improving    Affect:  Congruent, mildly depressed yet improving   Thought Process:  Linear, logical, goal-directed  Orientation:  Full (Time, Place, and Person)  Thought Content: denies hallucinations , no delusions expressed   Suicidal Thoughts:  No- denies suicidal or self injurious ideations, denies any homicidal or violent ideations  Homicidal Thoughts:  No  Memory:  Immediate;   Good Recent;   Good Remote;   Good  Judgement:  Improving   Insight:  Fair  Psychomotor Activity:  Normal  Concentration:  Concentration: Good and Attention Span: Good  Recall:  Good  Fund of Knowledge:  Good  Language:  Good  Akathisia:  No  Handed:  Right  AIMS (if indicated):     Assets:  Communication Skills Desire for Improvement  ADL's:  Intact  Cognition:  WNL  Sleep:  Number of Hours: 6.25   Treatment Plan Bipolar 2 disorder (HCC) improving, managed as below and likely stable for discharge tomorrow. Will continue current meds as he is doing so well, reviewed today on 08/15/16 without changes below.   -Encourage ongoing group and milieu participation to work on coping skills and symptom reduction - Increase  Depakote ER to 1000 mg QDAY for mood disorder  - Continue Quetiapine  100 mg  QHS for mood disorder, psychotic symptoms  -Continue Vistaril 245 mgrs Q 6 hours PRN for anxiety - Treatment team working on disposition planning options   Beau FannyWithrow, Janelli Welling C, FNP 08/15/2016, 9:47 AM

## 2016-08-15 NOTE — Tx Team (Signed)
Interdisciplinary Treatment and Diagnostic Plan Update  08/15/2016 Time of Session: 9:30AM Ryan Bartlett MRN: [id number]  Principal Diagnosis: Bipolar 2 disorder [facility])  Secondary Diagnoses: Principal Problem:   Bipolar 2 disorder (HCC)   Current Medications:  Current Facility-Administered Medications  Medication Dose Route Frequency Provider Last Rate Last Dose  . acetaminophen (TYLENOL) tablet 650 mg  650 mg Oral Q6H PRN Kerry Hough, PA-C   650 mg at 08/10/16 1112  . albuterol (PROVENTIL HFA;VENTOLIN HFA) 108 (90 Base) MCG/ACT inhaler 2 puff  2 puff Inhalation Q6H PRN Kerry Hough, PA-C   2 puff at 08/11/16 1848  . alum & mag hydroxide-simeth (MAALOX/MYLANTA) 200-200-20 MG/5ML suspension 30 mL  30 mL Oral Q4H PRN Kerry Hough, PA-C      . benzonatate (TESSALON) capsule 200 mg  200 mg Oral TID PRN Kerry Hough, PA-C   200 mg at 08/11/16 0135  . divalproex (DEPAKOTE ER) 24 hr tablet 1,000 mg  1,000 mg Oral Daily Rockey Situ Cobos, MD   1,000 mg at 08/15/16 1105  . hydrOXYzine (ATARAX/VISTARIL) tablet 25 mg  25 mg Oral Q6H PRN Kerry Hough, PA-C   25 mg at 08/15/16 1544  . ibuprofen (ADVIL,MOTRIN) tablet 600 mg  600 mg Oral Q6H PRN Kerry Hough, PA-C   600 mg at 08/13/16 1210  . magnesium hydroxide (MILK OF MAGNESIA) suspension 30 mL  30 mL Oral Daily PRN Kerry Hough, PA-C      . nicotine polacrilex (NICORETTE) gum 2 mg  2 mg Oral PRN Craige Cotta, MD   2 mg at 08/15/16 1258  . QUEtiapine (SEROQUEL) tablet 100 mg  100 mg Oral QHS Oneta Rack, NP   100 mg at 08/14/16 2231  . QUEtiapine (SEROQUEL) tablet 50 mg  50 mg Oral Q8H PRN Neysa Hotter, MD       PTA Medications: Prescriptions Prior to Admission  Medication Sig Dispense Refill Last Dose  . albuterol (PROVENTIL HFA;VENTOLIN HFA) 108 (90 Base) MCG/ACT inhaler Inhale 2 puffs into the lungs every 6 (six) hours as needed for wheezing or shortness of breath.   Past Month at Unknown time  . benzonatate  (TESSALON) 100 MG capsule Take 2 capsules (200 mg total) by mouth 2 (two) times daily as needed for cough. 20 capsule 0   . benztropine (COGENTIN) 1 MG tablet Take 1 mg by mouth 2 (two) times daily.   06/19/2016 at Unknown time  . [EXPIRED] divalproex (DEPAKOTE ER) 500 MG 24 hr tablet Take 500 mg by mouth 2 (two) times daily.   06/19/2016 at Unknown time  . ibuprofen (ADVIL,MOTRIN) 600 MG tablet Take 1 tablet (600 mg total) by mouth every 6 (six) hours as needed. 30 tablet 0   . oxymetazoline (AFRIN NASAL SPRAY) 0.05 % nasal spray Place 1 spray into both nostrils 2 (two) times daily. Spray once into each nostril twice daily for up to the next 3 days. Do not use for more than 3 days to prevent rebound rhinorrhea. 30 mL 0   . QUEtiapine (SEROQUEL) 100 MG tablet Take 1 tablet (100 mg total) by mouth at bedtime. (Patient not taking: Reported on 06/20/2016) 30 tablet 0 Not Taking at Unknown time  . [EXPIRED] risperiDONE (RISPERDAL) 3 MG tablet Take 3 mg by mouth at bedtime.   06/19/2016 at Unknown time    Patient Stressors: Financial difficulties Legal issue Medication change or noncompliance Substance abuse  Patient Strengths: Capable of independent living Manufacturing systems engineer  General fund of knowledge Motivation for treatment/growth Physical Health  Treatment Modalities: Medication Management, Group therapy, Case management,  1 to 1 session with clinician, Psychoeducation, Recreational therapy.   Physician Treatment Plan for Primary Diagnosis: Bipolar 2 disorder (HCC) Long Term Goal(s): Improvement in symptoms so as ready for discharge Improvement in symptoms so as ready for discharge   Short Term Goals: Ability to demonstrate self-control will improve Ability to identify and develop effective coping behaviors will improve Ability to demonstrate self-control will improve Ability to identify and develop effective coping behaviors will improve  Medication Management: Evaluate patient's  response, side effects, and tolerance of medication regimen.  Therapeutic Interventions: 1 to 1 sessions, Unit Group sessions and Medication administration.  Evaluation of Outcomes: Met  Physician Treatment Plan for Secondary Diagnosis: Principal Problem:   Bipolar 2 disorder (HCC)  Long Term Goal(s): Improvement in symptoms so as ready for discharge Improvement in symptoms so as ready for discharge   Short Term Goals: Ability to demonstrate self-control will improve Ability to identify and develop effective coping behaviors will improve Ability to demonstrate self-control will improve Ability to identify and develop effective coping behaviors will improve     Medication Management: Evaluate patient's response, side effects, and tolerance of medication regimen.  Therapeutic Interventions: 1 to 1 sessions, Unit Group sessions and Medication administration.  Evaluation of Outcomes: Met   RN Treatment Plan for Primary Diagnosis: Bipolar 2 disorder (HCC) Long Term Goal(s): Knowledge of disease and therapeutic regimen to maintain health will improve  Short Term Goals: Ability to remain free from injury will improve  Medication Management: RN will administer medications as ordered by provider, will assess and evaluate patient's response and provide education to patient for prescribed medication. RN will report any adverse and/or side effects to prescribing provider.  Therapeutic Interventions: 1 on 1 counseling sessions, Psychoeducation, Medication administration, Evaluate responses to treatment, Monitor vital signs and CBGs as ordered, Perform/monitor CIWA, COWS, AIMS and Fall Risk screenings as ordered, Perform wound care treatments as ordered.  Evaluation of Outcomes: Met   [name] Treatment Plan for Primary Diagnosis: Bipolar 2 disorder (HCC) Long Term Goal(s): Safe transition to appropriate next level of care at discharge, Engage patient in therapeutic group addressing interpersonal  concerns.  Short Term Goals: Engage patient in aftercare planning with referrals and resources, Facilitate patient progression through stages of change regarding substance use diagnoses and concerns and Identify triggers associated with mental health/substance abuse issues  Therapeutic Interventions: Assess for all discharge needs, 1 to 1 time with Social worker, Explore available resources and support systems, Assess for adequacy in community support network, Educate family and significant other(s) on suicide prevention, Complete Psychosocial Assessment, Interpersonal group therapy.  Evaluation of Outcomes: Met   Progress in Treatment: Attending groups: Yes  Participating in groups: Yes Taking medication as prescribed: Yes. Toleration medication: Yes. Family/Significant other contact made: SPE completed with pt; pt declines to consent to family contact.  Patient understands diagnosis: Yes. Discussing patient identified problems/goals with staff: Yes. Medical problems stabilized or resolved: Yes. Denies suicidal/homicidal ideation: Yes.  Issues/concerns per patient self-inventory: No. Other: n/a   New problem(s) identified: No, Describe:  n/a  New Short Term/Long Term Goal(s): mood stabilization/medication management/elimination of SI thoughts/development of comprehensive mental wellness/sobriety plan.   Discharge Plan or Barriers: Pt's ACt team has been notified and updated on pt progress. [facility] will pick up patient on Thursday at 1pm to transport home and will resume services. Pt given community gang support resources,  Mental Health Association pamphlet and AA/NA list for [facility]. He has also been provided with [place] list for [place] and various low income housing/boarding room listings for the area per his request.   Reason for Continuation of Hospitalization: Medication management  Estimated Length of Stay: 1 day (discharge scheduled for Thursday at  1pm).    Attendees: Patient: 08/15/2016 4:06 PM  Physician: Dr. Shea Evans; Dr. Adele Schilder MD 08/15/2016 4:06 PM  Nursing: Everlean Cherry RN 08/15/2016 4:06 PM  RN Care Manager:  08/15/2016 4:06 PM  Social Worker: Maxie Better, [name]; Peri Maris [name] 08/15/2016 4:06 PM  Recreational Therapist:  08/15/2016 4:06 PM  Other: Samuel Jester NP; Catalina Pizza NP 08/15/2016 4:06 PM  Other:  08/15/2016 4:06 PM  Other: 08/15/2016 4:06 PM    Scribe for Treatment Team: [place], [name] 08/15/2016 4:06 PM

## 2016-08-15 NOTE — Progress Notes (Signed)
  [facility] :  Will you be returning to the same living situation after discharge:  Yes,  home with mom until he is able to find permanent housing/apt At discharge, do you have transportation home?: Yes,  pt's ACT team will pick him up at 1:00PM on Thursday, 08/16/16 Do you have the ability to pay for your medications: Yes,  Medicaid of [place]  Release of information consent forms completed and submitted to medical records by CSW.  Patient to Follow up at: Follow-up Information    [facility] Follow up on 08/16/2016.   Why:  ACT team will pick patient up at 1:00PM on Thursday, 08/16/16 and will resume services.  Contact information: [address]  [place] [[phone]] [phone]          Next level of care provider [facility] Link:no  Safety Planning and Suicide Prevention discussed: Yes,  SPE completed with pt; pt declined to consent to family contact. SPI pamphlet and mobile crisis information provided to pt.   Have you used any form of tobacco in the last 30 days? (Cigarettes, Smokeless Tobacco, Cigars, and/or Pipes): Yes  Has patient been referred to the Quitline?: Patient refused referral  Patient has been referred for addiction treatment: Yes  Vinayak Bobier N Smart [name] 08/15/2016, 4:05 PM

## 2016-08-15 NOTE — [facility] Group Notes (Signed)
[facility] [name] Group Therapy  08/15/2016 4:17 PM  Type of Therapy:  Group Therapy  Participation Level:  Active  Participation Quality:  Attentive  Affect:  Appropriate  Cognitive:  Alert and Oriented  Insight:  Improving  Engagement in Therapy:  Engaged  Modes of Intervention:  Confrontation, Discussion, Education, Exploration, Role-play and Socialization  Summary of Progress/Problems: Today's Topic: Overcoming Obstacles. Patients identified one short term goal and potential obstacles in reaching this goal. Patients processed barriers involved in overcoming these obstacles. Patients identified steps necessary for overcoming these obstacles and explored motivation (internal and external) for facing these difficulties head on. Ryan Bartlett was attentive and engaged during today's processing group. He explored his ties to gang life and shared that it is hard to detach himself from this lifestyle both physically and mentally. Ryan Bartlett weighed the pros and cons of gang lifestyle and shared that his goal is to "change my vocabulary, work with my ACT team.   Ryan PeoplesHeather N Smart 08/15/2016, 4:17 PM

## 2016-08-15 NOTE — Progress Notes (Signed)
Patient ID: Ryan Bartlett, male   DOB: [birth year], 20 y.o.   MRN: [id number]  Writer approached patient in his room as patient was laying in bed. Writer informed patient that his medications were ready. Patient verbalized understanding but has yet to come receive his scheduled medications.

## 2016-08-15 NOTE — Progress Notes (Signed)
CSW contacted pt's ACt team [facility] to provide update regarding pt status and tentative discharge scheduled for Thursday. They will pick up pt at 1:00PM. CSW also informed ACT team that pt's mother called requesting information regarding pt, but pt has not provided consent to speak with her. They will update pt's mother as needed.   Trula SladeHeather Smart, MSW, [name] Clinical Social Worker 08/15/2016 1:01 PM

## 2016-08-15 NOTE — Progress Notes (Signed)
Patient ID: Ryan Bartlett, male   DOB: 09/15/1995, 20 y.o.   MRN: [id number] D: Client visible on the unit, reports "I'm going home tomorrow" Client reports he will be receiving services from [facility]" "I'm going to try not to be as involved with drugs as I was that's progress for me" Client mood changed from sad to aminated as he interacted with another client close in is age range. "It's a man thing" Clients laughed and talked about girls. A: Writer provided emotional support, reviewed medications, administered as ordered. Staff will monitor q2315min for safety. R: Client is safe on the unit.

## 2016-08-15 NOTE — Progress Notes (Signed)
Recreation Therapy Notes  Date: 08/15/16 Time: 0930 Location: [address] Dayroom  Group Topic: Stress Management  Goal Area(s) Addresses:  Patient will verbalize importance of using healthy stress management.  Patient will identify positive emotions associated with healthy stress management.   Intervention: Calm App  Activity :  Resilience Meditation.  LRT introduced the stress management technique of meditation.  LRT played the meditation from the calm app to engage patients in the activity.  Patients were to follow along as the meditation played to engage in the process.  Education:  Stress Management, Discharge Planning.   Education Outcome: Acknowledges edcuation/In group clarification offered/Needs additional education  Clinical Observations/Feedback: Pt did not attend  group.   Caroll RancherMarjette Jaymi Tinner, LRT/CTRS         Caroll RancherLindsay, Eliyah Mcshea A 08/15/2016 12:25 PM

## 2016-08-15 NOTE — Progress Notes (Signed)
Patient ID: Ryan Bartlett, male   DOB: [birth year], 20 y.o.   MRN: [id number]  DAR: Pt. Denies SI/HI and A/V Hallucinations. Patient does not report any pain or discomfort at this time. Support and encouragement provided to the patient. Scheduled medication administered to patient late as patient would not get out of bed until after 11 am. Patient is minimal with Clinical research associatewriter and guarded. He reports sleep is fair, appetite is fair, energy level is normal, and concentration is good. He rates depression, hopelessness, and anxiety 1/10. Q15 minute checks are maintained for safety.

## 2016-08-16 DIAGNOSIS — Z91011 Allergy to milk products: Secondary | ICD-10-CM

## 2016-08-16 DIAGNOSIS — Z91010 Allergy to peanuts: Secondary | ICD-10-CM

## 2016-08-16 MED ORDER — QUETIAPINE FUMARATE 100 MG PO TABS: 100.0000 mg | ORAL_TABLET | Freq: Every day | ORAL | 0 refills | Status: DC

## 2016-08-16 MED ORDER — NICOTINE POLACRILEX 2 MG MT GUM: 2.0000 mg | CHEWING_GUM | OROMUCOSAL | 0 refills | Status: DC | PRN

## 2016-08-16 MED ORDER — QUETIAPINE FUMARATE 50 MG PO TABS: 50.0000 mg | ORAL_TABLET | Freq: Three times a day (TID) | ORAL | 0 refills | Status: DC | PRN

## 2016-08-16 MED ORDER — HYDROXYZINE HCL 25 MG PO TABS: 25.0000 mg | ORAL_TABLET | Freq: Four times a day (QID) | ORAL | 0 refills | Status: DC | PRN

## 2016-08-16 MED ORDER — DIVALPROEX SODIUM ER 500 MG PO TB24: 1000.0000 mg | ORAL_TABLET | Freq: Every day | ORAL | 0 refills | Status: DC

## 2016-08-16 NOTE — Progress Notes (Signed)
Pt d/c from the hospital with an ACT team member. All items returned. D/C instructions and prescriptions given. Pt denies si and hi.

## 2016-08-16 NOTE — [facility] Suicide Risk Assessment (Signed)
[facility]   Principal Problem: Bipolar 2 disorder [facility](HCC) Discharge Diagnoses:  Patient Active Problem List   Diagnosis Date Noted  . Bipolar 2 disorder (HCC) [F31.81] 08/08/2016  . 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 Bipolar affective disorder, current episode depressed (HCC) [F31.30] 03/08/2016  . Bipolar affect, depressed (HCC) [F31.30] 09/18/2015  . Bipolar affective disorder, currently depressed, mild (HCC) [F31.31] 09/17/2015  . Intentional overdose of drug in tablet form (HCC) [T50.902A]   . Major depressive disorder, recurrent episode, moderate (HCC) [F33.1] 07/09/2015  . Depression [F32.9]   . Suicidal ideation [R45.851]   . Cannabis use disorder, moderate, dependence (HCC) [F12.20] 06/06/2015  . Alcohol use disorder, moderate, dependence (HCC) [F10.20] 06/06/2015  . Bipolar I disorder, most recent episode depressed (HCC) [F31.30] 05/23/2013  . Cannabis abuse [F12.10] 03/30/2012  . ADHD (attention deficit hyperactivity disorder), combined type [F90.2] 08/28/2011  . Conduct disorder, adolescent onset type [F91.2] 08/28/2011    Total Time spent with patient: 30 minutes  Musculoskeletal: Strength & Muscle Tone: within normal limits Gait & Station: normal Patient leans: N/A  Psychiatric Specialty Exam: Review of Systems  Psychiatric/Behavioral: Negative for depression and suicidal ideas.  All other systems reviewed and are negative.   Blood pressure 119/73, pulse 79, temperature 98.1 F (36.7 C), temperature source Oral, resp. rate 18, height 5\' 9"  (1.753 m), weight 95.3 kg (210 lb), SpO2 97 %.Body mass index is 31.01 kg/m.  General Appearance: Casual  Eye Contact::  Fair  Speech:  Clear and Coherent409  Volume:  Normal  Mood:  Euthymic  Affect:  Appropriate  Thought Process:  Goal Directed and Descriptions of Associations: Intact  Orientation:  Full (Time, Place, and Person)  Thought Content:  Logical  Suicidal Thoughts:  No   Homicidal Thoughts:  No  Memory:  Immediate;   Fair Recent;   Fair Remote;   Fair  Judgement:  Fair  Insight:  Fair  Psychomotor Activity:  Normal  Concentration:  Fair  Recall:  Fis[employer]Fair  Fund of Knowledge:Fair  Language: Fair  Akathisia:  No  Handed:  Right  AIMS (if indicated):     Assets:  Desire for Improvement  Sleep:  Number of Hours: 5.5  Cognition: WNL  ADL's:  Intact   Mental Status Per Nursing Assessment::   On Admission:  Suicidal ideation indicated by patient  Demographic Factors:  Male  Loss Factors: NA  Historical Factors: Impulsivity  Risk Reduction Factors:   Positive therapeutic relationship  Continued Clinical Symptoms:  Previous Psychiatric Diagnoses and Treatments  Cognitive Features That Contribute To Risk:  None    Suicide Risk:  Minimal: No identifiable suicidal ideation.  Patients presenting with no risk factors but with morbid ruminations; may be classified as minimal risk based on the severity of the depressive symptoms  Follow-up Information    [facility] Follow up on 08/16/2016.   Why:  ACT team will pick patient up at 1:00PM on Thursday, 08/16/16 and will resume services.  Contact information: [address]  [place] [336] [phone]          Plan Of Care/Follow-up recommendations:  Activity:  no restrictions Diet:  regular Tests:  Depakote level in 5 days . Other:  none  Mariaceleste Herrera, MD 08/16/2016, 10:00 AM

## 2016-08-16 NOTE — Discharge Summary (Signed)
Physician Discharge Summary Note  Patient:  Ryan Bartlett is an 20 y.o., male MRN:  [id number] DOB:  December 02, 1995 Patient phone:  [phone] (home)  Patient address:   [address] [place] [id number],  Total Time spent with patient: 45 minutes  Date of Admission:  08/08/2016 Date of Discharge: 08/16/16  Reason for Admission:   Ryan Bartlett is a 20 year old male, with schizophrenia, bipolar disorder per report,  [facility]. He came in with suicidal ideation with plan to hang self.   Patient states that he has been feeling depressed lately. He started to have nightmares of being stabbed by somebody every day. He denies any trauma related to his nightmares. He is also concerned as he was evicted from his house and currently lives with his friends. He uses marijuana every week, and used to use cocaine, meth, last 3 months ago.   He reports insomnia due to nightmares. He has anhedonia. He denies decreased need for sleep/euphoria, although he later states that he has some "mood swings," which he is unable to elaborate it. He denies AH/VH. He denies paranoia.   Principal Problem: Bipolar 2 disorder [facility](HCC) Discharge Diagnoses: Patient Active Problem List   Diagnosis Date Noted  . Bipolar 2 disorder (HCC) [F31.81] 08/08/2016  . 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 Bipolar affective disorder, current episode depressed (HCC) [F31.30] 03/08/2016  . Bipolar affect, depressed (HCC) [F31.30] 09/18/2015  . Bipolar affective disorder, currently depressed, mild (HCC) [F31.31] 09/17/2015  . Intentional overdose of drug in tablet form (HCC) [T50.902A]   . Major depressive disorder, recurrent episode, moderate (HCC) [F33.1] 07/09/2015  . Depression [F32.9]   . Suicidal ideation [R45.851]   . Cannabis use disorder, moderate, dependence (HCC) [F12.20] 06/06/2015  . Alcohol use disorder, moderate, dependence (HCC) [F10.20] 06/06/2015  . Bipolar I disorder,  most recent episode depressed (HCC) [F31.30] 05/23/2013  . Cannabis abuse [F12.10] 03/30/2012  . ADHD (attention deficit hyperactivity disorder), combined type [F90.2] 08/28/2011  . Conduct disorder, adolescent onset type [F91.2] 08/28/2011    Past Psychiatric History: see H&P  Past Medical History:  Past Medical History:  Diagnosis Date  . ADHD (attention deficit hyperactivity disorder)   . Arthritis   . Asthma   . Bipolar disorder (HCC)   . Depression   . Oppositional defiant disorder   . Unspecified episodic mood disorder     Past Surgical History:  Procedure Laterality Date  . NO PAST SURGERIES     Family History:  Family History  Problem Relation Age of Onset  . Depression Mother   . Diabetes Other   . Hyperlipidemia Other   . Hypertension Other    Family Psychiatric  History: see H&P Social History:  History  Alcohol Use  . 3.6 oz/week  . 6 Cans of beer per week    Comment: "I actually recently quit"     History  Drug Use  . Types: Marijuana, Methamphetamines    Social History   Social History  . Marital status: Single    Spouse name: N/A  . Number of children: N/A  . Years of education: N/A   Occupational History  . student Minor    12th grade at [employer]   Social History Main Topics  . Smoking status: Current Every Day Smoker    Packs/day: 0.50    Years: 0.00    Types: Cigarettes  . Smokeless tobacco: Never Used  . Alcohol use 3.6 oz/week  6 Cans of beer per week     Comment: "I actually recently quit"  . Drug use:     Types: Marijuana, Methamphetamines  . Sexual activity: Not Currently    Partners: Female     Comment: Pt reports that he is not sexually active   Other Topics Concern  . None   Social History Narrative  . None    Hospital Course:   Ryan Bartlett was admitted for Bipolar 2 disorder [facility]) , and crisis management.  Pt was treated discharged with the medications listed below under Medication List.  Medical problems  were identified and treated as needed.  Home medications were restarted as appropriate.  Improvement was monitored by observation and Ryan Bartlett 's daily report of symptom reduction.  Emotional and mental status was monitored by daily self-inventory reports completed by Ryan Bartlett and clinical staff.         Ryan Bartlett was evaluated by the treatment team for stability and plans for continued recovery upon discharge. Ryan Bartlett 's motivation was an integral factor for scheduling further treatment. Employment, transportation, bed availability, health status, family support, and any pending legal issues were also considered during hospital stay. Pt was offered further treatment options upon discharge including but not limited to Residential, Intensive Outpatient, and Outpatient treatment.  Ryan Bartlett will follow up with the services as listed below under Follow Up Information.     Upon completion of this admission the patient was both mentally and medically stable for discharge denying suicidal/homicidal ideation, auditory/visual/tactile hallucinations, delusional thoughts and paranoia.   Ryan Bartlett responded well to treatment with depakote, vistaril, nicotine, seroquel without adverse effects. Pt demonstrated improvement without reported or observed adverse effects to the point of stability appropriate for outpatient management. Pertinent labs include: LDL 115H, Prolactin 65.2H, A1C 5.6H for which outpatient follow-up is necessary for lab recheck as mentioned below. Reviewed CBC, CMP, BAL, and UDS; all unremarkable aside from noted exceptions.   Physical Findings: AIMS: Facial and Oral Movements Muscles of Facial Expression: None, normal Lips and Perioral Area: None, normal Jaw: None, normal Tongue: None, normal,Extremity Movements Upper (arms, wrists, hands, fingers): None, normal Lower (legs, knees, ankles, toes): None, normal, Trunk Movements Neck, shoulders,  hips: None, normal, Overall Severity Severity of abnormal movements (highest score from questions above): None, normal Incapacitation due to abnormal movements: None, normal Patient's awareness of abnormal movements (rate only patient's report): No Awareness, Dental Status Current problems with teeth and/or dentures?: No Does patient usually wear dentures?: No  CIWA:  CIWA-Ar Total: 0 COWS:  COWS Total Score: 1  Musculoskeletal: Strength & Muscle Tone: within normal limits Gait & Station: normal Patient leans: N/A  Psychiatric Specialty Exam: Physical Exam  Review of Systems  Psychiatric/Behavioral: Positive for depression. Negative for hallucinations, substance abuse and suicidal ideas. The patient is nervous/anxious and has insomnia.   All other systems reviewed and are negative.   Blood pressure 119/73, pulse 79, temperature 98.1 F (36.7 C), temperature source Oral, resp. rate 18, height 5\' 9"  (1.753 m), weight 95.3 kg (210 lb), SpO2 97 %.Body mass index is 31.01 kg/m.  SEE MD PSE WITHIN SRA  Have you used any form of tobacco in the last 30 days? (Cigarettes, Smokeless Tobacco, Cigars, and/or Pipes): Yes  Has this patient used any form of tobacco in the last 30 days? (Cigarettes, Smokeless Tobacco, Cigars, and/or Pipes) Yes, Yes, A prescription for an FDA-approved tobacco cessation medication was offered at  discharge and the patient refused  Blood Alcohol level:  Lab Results  Component Value Date   [facility] <5 06/21/2016   ETH <5 05/01/2016    Metabolic Disorder Labs:  Lab Results  Component Value Date   HGBA1C 5.6 08/11/2016   MPG 114 08/11/2016   MPG 117 08/10/2016   Lab Results  Component Value Date   PROLACTIN 65.2 (H) 08/09/2016   Lab Results  Component Value Date   CHOL 181 08/11/2016   TRIG 116 08/11/2016   HDL 43 08/11/2016   CHOLHDL 4.2 08/11/2016   VLDL 23 08/11/2016   LDLCALC 115 (H) 08/11/2016   LDLCALC 126 (H) 08/10/2016    See Psychiatric  Specialty Exam and Suicide Risk Assessment completed by Attending Physician prior to discharge.  Discharge destination:  Home  Is patient on multiple antipsychotic therapies at discharge:  No   Has Patient had three or more failed trials of antipsychotic monotherapy by history:  No  Recommended Plan for Multiple Antipsychotic Therapies: NA   Allergies as of 08/16/2016      Reactions   Peanut-containing Drug Products Anaphylaxis   Lactose Intolerance (gi) Diarrhea, Nausea And Vomiting      Medication List    STOP taking these medications   benzonatate 100 MG capsule Commonly known as:  TESSALON   benztropine 1 MG tablet Commonly known as:  COGENTIN   ibuprofen 600 MG tablet Commonly known as:  ADVIL,MOTRIN   oxymetazoline 0.05 % nasal spray Commonly known as:  AFRIN NASAL SPRAY   risperiDONE 3 MG tablet Commonly known as:  RISPERDAL     TAKE these medications     Indication  albuterol 108 (90 Base) MCG/ACT inhaler Commonly known as:  PROVENTIL HFA;VENTOLIN HFA Inhale 2 puffs into the lungs every 6 (six) hours as needed for wheezing or shortness of breath.  Indication:  Asthma   divalproex 500 MG 24 hr tablet Commonly known as:  DEPAKOTE ER Take 2 tablets (1,000 mg total) by mouth daily. Start taking on:  08/17/2016 What changed:  how much to take  when to take this  Indication:  mood stabilization   hydrOXYzine 25 MG tablet Commonly known as:  ATARAX/VISTARIL Take 1 tablet (25 mg total) by mouth every 6 (six) hours as needed for anxiety.  Indication:  Anxiety Neurosis   nicotine polacrilex 2 MG gum Commonly known as:  NICORETTE Take 1 each (2 mg total) by mouth as needed for smoking cessation.  Indication:  Nicotine Addiction   QUEtiapine 50 MG tablet Commonly known as:  SEROQUEL Take 1 tablet (50 mg total) by mouth every 8 (eight) hours as needed (agitation). What changed:  medication strength  how much to take  when to take this  reasons to  take this  Indication:  agitation   QUEtiapine 100 MG tablet Commonly known as:  SEROQUEL Take 1 tablet (100 mg total) by mouth at bedtime. What changed:  You were already taking a medication with the same name, and this prescription was added. Make sure you understand how and when to take each.  Indication:  mood stabilization      Follow-up Information    [facility] Follow up on 08/16/2016.   Why:  ACT team will pick patient up at 1:00PM on Thursday, 08/16/16 and will resume services.  Contact information: [address]  [place] [336] [phone]          Follow-up recommendations:  Activity:  As tolerated Diet:  Heart healthy with  low sodium. Other:  Depakote level in 5 days outpatient  Comments:   Take all medications as prescribed. Keep all follow-up appointments as scheduled.  Do not consume alcohol or use illegal drugs while on prescription medications. Report any adverse effects from your medications to your primary care provider promptly.  In the event of recurrent symptoms or worsening symptoms, call 911, a crisis hotline, or go to the nearest emergency department for evaluation.    Signed: Beau Fanny, FNP 08/16/2016, 10:43 AM

## 2016-11-06 ENCOUNTER — Emergency Department (HOSPITAL_COMMUNITY)
Admission: EM | Admit: 2016-11-06 | Discharge: 2016-11-07 | Disposition: A | Discharge status: Other Acute Inpatient Hospital | Payer: Medicaid Other | Attending: Emergency Medicine | Admitting: Emergency Medicine

## 2016-11-06 ENCOUNTER — Encounter (HOSPITAL_COMMUNITY): Payer: Self-pay | Admitting: *Deleted

## 2016-11-06 DIAGNOSIS — R45851 Suicidal ideations: Secondary | ICD-10-CM

## 2016-11-06 DIAGNOSIS — Z91010 Allergy to peanuts: Secondary | ICD-10-CM | POA: Insufficient documentation

## 2016-11-06 DIAGNOSIS — J45909 Unspecified asthma, uncomplicated: Secondary | ICD-10-CM | POA: Insufficient documentation

## 2016-11-06 DIAGNOSIS — F331 Major depressive disorder, recurrent, moderate: Secondary | ICD-10-CM | POA: Insufficient documentation

## 2016-11-06 DIAGNOSIS — Z79899 Other long term (current) drug therapy: Secondary | ICD-10-CM | POA: Insufficient documentation

## 2016-11-06 DIAGNOSIS — F3130 Bipolar disorder, current episode depressed, mild or moderate severity, unspecified: Secondary | ICD-10-CM | POA: Diagnosis present

## 2016-11-06 DIAGNOSIS — F17210 Nicotine dependence, cigarettes, uncomplicated: Secondary | ICD-10-CM | POA: Insufficient documentation

## 2016-11-06 DIAGNOSIS — F909 Attention-deficit hyperactivity disorder, unspecified type: Secondary | ICD-10-CM | POA: Insufficient documentation

## 2016-11-06 LAB — CBC
HCT: 39.6 % (ref 39.0–52.0)
Hemoglobin: 13.6 g/dL (ref 13.0–17.0)
MCH: 28.5 pg (ref 26.0–34.0)
MCHC: 34.3 g/dL (ref 30.0–36.0)
MCV: 83 fL (ref 78.0–100.0)
PLATELETS: 230 10*3/uL (ref 150–400)
RBC: 4.77 MIL/uL (ref 4.22–5.81)
RDW: 12.9 % (ref 11.5–15.5)
WBC: 8.8 10*3/uL (ref 4.0–10.5)

## 2016-11-06 LAB — RAPID URINE DRUG SCREEN, HOSP PERFORMED
AMPHETAMINES: NOT DETECTED
Barbiturates: NOT DETECTED
Benzodiazepines: NOT DETECTED
COCAINE: NOT DETECTED
OPIATES: NOT DETECTED
Tetrahydrocannabinol: POSITIVE — AB

## 2016-11-06 LAB — COMPREHENSIVE METABOLIC PANEL
ALT: 18 U/L (ref 17–63)
ANION GAP: 4 — AB (ref 5–15)
AST: 20 U/L (ref 15–41)
Albumin: 4.1 g/dL (ref 3.5–5.0)
Alkaline Phosphatase: 59 U/L (ref 38–126)
BUN: 10 mg/dL (ref 6–20)
CHLORIDE: 106 mmol/L (ref 101–111)
CO2: 27 mmol/L (ref 22–32)
CREATININE: 0.9 mg/dL (ref 0.61–1.24)
Calcium: 9.1 mg/dL (ref 8.9–10.3)
GFR calc non Af Amer: >60 mL/min (ref >60)
Glucose, Bld: 102 mg/dL — ABNORMAL HIGH (ref 65–99)
POTASSIUM: 3.9 mmol/L (ref 3.5–5.1)
Sodium: 137 mmol/L (ref 135–145)
Total Bilirubin: 0.4 mg/dL (ref 0.3–1.2)
Total Protein: 7.2 g/dL (ref 6.5–8.1)

## 2016-11-06 LAB — ETHANOL

## 2016-11-06 LAB — SALICYLATE LEVEL

## 2016-11-06 LAB — ACETAMINOPHEN LEVEL

## 2016-11-06 NOTE — ED Provider Notes (Signed)
[facility]-EMERGENCY DEPT Provider Note   CSN: [id number] Arrival date & time: 11/06/16  1918     History   Chief Complaint Chief Complaint  Patient presents with  . Suicidal    HPI Ryan Bartlett is a 21 y.o. male.  HPI Patient reports he's having increasing frequency of suicidal thoughts. He states he's had thoughts of hanging himself or overdosing. He reports he's been suicidal off and on but things have gotten worse lately. He has had increased depression. Past Medical History:  Diagnosis Date  . ADHD (attention deficit hyperactivity disorder)   . Arthritis   . Asthma   . Bipolar disorder (HCC)   . Depression   . Oppositional defiant disorder   . Unspecified episodic mood disorder     Patient Active Problem List   Diagnosis Date Noted  . Bipolar 2 disorder (HCC) 08/08/2016  . 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 Bipolar affective disorder, current episode depressed (HCC) 03/08/2016  . Bipolar affect, depressed (HCC) 09/18/2015  . Bipolar affective disorder, currently depressed, mild (HCC) 09/17/2015  . Intentional overdose of drug in tablet form (HCC)   . Major depressive disorder, recurrent episode, moderate (HCC) 07/09/2015  . Depression   . Suicidal ideation   . Cannabis use disorder, moderate, dependence (HCC) 06/06/2015  . Alcohol use disorder, moderate, dependence (HCC) 06/06/2015  . Bipolar I disorder, most recent episode depressed (HCC) 05/23/2013  . Cannabis abuse 03/30/2012  . ADHD (attention deficit hyperactivity disorder), combined type 08/28/2011  . Conduct disorder, adolescent onset type 08/28/2011    Past Surgical History:  Procedure Laterality Date  . NO PAST SURGERIES         Home Medications    Prior to Admission medications   Medication Sig Start Date End Date Taking? Authorizing Provider  divalproex (DEPAKOTE ER) 500 MG 24 hr tablet Take 2 tablets (1,000 mg total) by mouth daily. 08/17/16  Yes Beau Fanny, FNP  hydrOXYzine  (ATARAX/VISTARIL) 25 MG tablet Take 1 tablet (25 mg total) by mouth every 6 (six) hours as needed for anxiety. 08/16/16  Yes Beau Fanny, FNP  Paliperidone Palmitate (INVEGA SUSTENNA IM) Inject 1 application into the muscle every 30 (thirty) days.   Yes Historical Provider, MD  PRESCRIPTION MEDICATION Inject 1 application into the skin every 30 (thirty) days. invega injection   Yes Historical Provider, MD  QUEtiapine (SEROQUEL) 100 MG tablet Take 1 tablet (100 mg total) by mouth at bedtime. 08/16/16  Yes Beau Fanny, FNP  QUEtiapine (SEROQUEL) 50 MG tablet Take 1 tablet (50 mg total) by mouth every 8 (eight) hours as needed (agitation). 08/16/16  Yes Beau Fanny, FNP  nicotine polacrilex (NICORETTE) 2 MG gum Take 1 each (2 mg total) by mouth as needed for smoking cessation. 08/16/16   Beau Fanny, FNP    Family History Family History  Problem Relation Age of Onset  . Depression Mother   . Diabetes Other   . Hyperlipidemia Other   . Hypertension Other     Social History Social History  Substance Use Topics  . Smoking status: Current Every Day Smoker    Packs/day: 0.50    Years: 0.00    Types: Cigarettes  . Smokeless tobacco: Never Used  . Alcohol use 3.6 oz/week    6 Cans of beer per week     Comment: "I actually recently quit"     Allergies   Peanut-containing drug products and Lactose intolerance (gi)   Review of Systems Review of  Systems 10 Systems reviewed and are negative for acute change except as noted in the HPI.   Physical Exam Updated Vital Signs BP 127/79 (BP Location: Left Arm)   Pulse 74   Temp 98.4 F (36.9 C) (Oral)   Resp 18   SpO2 100%   Physical Exam  Constitutional: He is oriented to person, place, and time. He appears well-developed and well-nourished.  HENT:  Head: Normocephalic and atraumatic.  Mouth/Throat: Oropharynx is clear and moist.  Eyes: Conjunctivae and EOM are normal.  Neck: Neck supple. No thyromegaly present.    Cardiovascular: Normal rate and regular rhythm.   No murmur heard. Pulmonary/Chest: Effort normal and breath sounds normal. No respiratory distress.  Abdominal: Soft. He exhibits no distension. There is no tenderness.  Musculoskeletal: He exhibits no edema, tenderness or deformity.  Neurological: He is alert and oriented to person, place, and time. No cranial nerve deficit. He exhibits normal muscle tone. Coordination normal.  Skin: Skin is warm and dry.  Psychiatric: He has a normal mood and affect.  Nursing note and vitals reviewed.    ED Treatments / Results  Labs (all labs ordered are listed, but only abnormal results are displayed) Labs Reviewed  COMPREHENSIVE METABOLIC PANEL - Abnormal; Notable for the following:       Result Value   Glucose, Bld 102 (*)    Anion gap 4 (*)    All other components within normal limits  ACETAMINOPHEN LEVEL - Abnormal; Notable for the following:    Acetaminophen (Tylenol), Serum <10 (*)    All other components within normal limits  RAPID URINE DRUG SCREEN, HOSP PERFORMED - Abnormal; Notable for the following:    Tetrahydrocannabinol POSITIVE (*)    All other components within normal limits  ETHANOL  SALICYLATE LEVEL  CBC    EKG  EKG Interpretation None       Radiology No results found.  Procedures Procedures (including critical care time)  Medications Ordered in ED Medications - No data to display   Initial Impression / Assessment and Plan / ED Course  I have reviewed the triage vital signs and the nursing notes.  Pertinent labs & imaging results that were available during my care of the patient were reviewed by me and considered in my medical decision making (see chart for details).      Final Clinical Impressions(s) / ED Diagnoses   Final diagnoses:  Suicidal ideation  Moderate episode of recurrent major depressive disorder [facility](HCC)   Patient presents with increased depression and suicidal thoughts. Patient is  otherwise clinically well without active medical problems. Patient is medically clear for psychiatric evaluation and disposition. New Prescriptions New Prescriptions   No medications on file     Arby BarretteMarcy Dane Bloch, MD 11/06/16 2159

## 2016-11-06 NOTE — ED Triage Notes (Signed)
Pt brought in voluntarily with [facility] due to suicidal ideation. Pt states he has felt "weird" and suicidal for the past 2 days. Pt states he feels better when he smokes marijuana. Pt states he would hang himself or OD on his grandmother's medications. Pt denies HI or A/V hallucinations.

## 2016-11-07 ENCOUNTER — Encounter (HOSPITAL_COMMUNITY): Payer: Self-pay | Admitting: *Deleted

## 2016-11-07 ENCOUNTER — Inpatient Hospital Stay (HOSPITAL_COMMUNITY)
Admission: AD | Admit: 2016-11-07 | Discharge: 2016-11-12 | DRG: 885 | Disposition: A | Discharge status: Home / Self Care | Payer: Medicaid Other | Source: Intra-hospital | Attending: Psychiatry | Admitting: Psychiatry

## 2016-11-07 DIAGNOSIS — R451 Restlessness and agitation: Secondary | ICD-10-CM | POA: Diagnosis present

## 2016-11-07 DIAGNOSIS — F17210 Nicotine dependence, cigarettes, uncomplicated: Secondary | ICD-10-CM | POA: Diagnosis present

## 2016-11-07 DIAGNOSIS — F1220 Cannabis dependence, uncomplicated: Secondary | ICD-10-CM | POA: Diagnosis present

## 2016-11-07 DIAGNOSIS — R45851 Suicidal ideations: Secondary | ICD-10-CM | POA: Diagnosis not present

## 2016-11-07 DIAGNOSIS — Z8249 Family history of ischemic heart disease and other diseases of the circulatory system: Secondary | ICD-10-CM | POA: Diagnosis not present

## 2016-11-07 DIAGNOSIS — Z79899 Other long term (current) drug therapy: Secondary | ICD-10-CM | POA: Diagnosis not present

## 2016-11-07 DIAGNOSIS — G4700 Insomnia, unspecified: Secondary | ICD-10-CM | POA: Diagnosis present

## 2016-11-07 DIAGNOSIS — F411 Generalized anxiety disorder: Secondary | ICD-10-CM | POA: Diagnosis present

## 2016-11-07 DIAGNOSIS — Z833 Family history of diabetes mellitus: Secondary | ICD-10-CM | POA: Diagnosis not present

## 2016-11-07 DIAGNOSIS — F3130 Bipolar disorder, current episode depressed, mild or moderate severity, unspecified: Secondary | ICD-10-CM | POA: Diagnosis not present

## 2016-11-07 DIAGNOSIS — E739 Lactose intolerance, unspecified: Secondary | ICD-10-CM | POA: Diagnosis present

## 2016-11-07 DIAGNOSIS — Z818 Family history of other mental and behavioral disorders: Secondary | ICD-10-CM | POA: Diagnosis not present

## 2016-11-07 DIAGNOSIS — F1290 Cannabis use, unspecified, uncomplicated: Secondary | ICD-10-CM | POA: Diagnosis not present

## 2016-11-07 DIAGNOSIS — Z915 Personal history of self-harm: Secondary | ICD-10-CM

## 2016-11-07 DIAGNOSIS — F259 Schizoaffective disorder, unspecified: Secondary | ICD-10-CM | POA: Diagnosis present

## 2016-11-07 DIAGNOSIS — F319 Bipolar disorder, unspecified: Principal | ICD-10-CM | POA: Diagnosis present

## 2016-11-07 DIAGNOSIS — Z91010 Allergy to peanuts: Secondary | ICD-10-CM | POA: Diagnosis not present

## 2016-11-07 DIAGNOSIS — F314 Bipolar disorder, current episode depressed, severe, without psychotic features: Secondary | ICD-10-CM | POA: Diagnosis not present

## 2016-11-07 MED ORDER — PALIPERIDONE ER 6 MG PO TB24
6.0000 mg | ORAL_TABLET | Freq: Every day | ORAL | Status: DC
  Filled 2016-11-07: qty 1

## 2016-11-07 MED ORDER — DIVALPROEX SODIUM ER 500 MG PO TB24: 1000.0000 mg | ORAL_TABLET | Freq: Every day | ORAL | Status: DC

## 2016-11-07 MED ORDER — TRAZODONE HCL 50 MG PO TABS: 50.0000 mg | ORAL_TABLET | Freq: Every day | ORAL | Status: DC

## 2016-11-07 MED ORDER — HYDROXYZINE HCL 25 MG PO TABS: 25.0000 mg | ORAL_TABLET | Freq: Three times a day (TID) | ORAL | Status: DC | PRN

## 2016-11-07 MED ORDER — NICOTINE POLACRILEX 2 MG MT GUM
2.0000 mg | CHEWING_GUM | OROMUCOSAL | Status: DC | PRN
  Administered 2016-11-07 – 2016-11-12 (×8): 2 mg via ORAL
  Filled 2016-11-07: qty 10
  Filled 2016-11-07 (×2): qty 1

## 2016-11-07 MED ORDER — DIVALPROEX SODIUM 250 MG PO DR TAB
250.0000 mg | DELAYED_RELEASE_TABLET | Freq: Two times a day (BID) | ORAL | Status: DC
  Administered 2016-11-07: 250 mg via ORAL
  Filled 2016-11-07: qty 1

## 2016-11-07 MED ORDER — HYDROXYZINE HCL 25 MG PO TABS
25.0000 mg | ORAL_TABLET | Freq: Three times a day (TID) | ORAL | Status: DC | PRN
  Administered 2016-11-08 – 2016-11-09 (×3): 25 mg via ORAL
  Filled 2016-11-07 (×3): qty 1

## 2016-11-07 MED ORDER — MAGNESIUM HYDROXIDE 400 MG/5ML PO SUSP: 30.0000 mL | Freq: Every day | ORAL | Status: DC | PRN

## 2016-11-07 MED ORDER — TRAZODONE HCL 50 MG PO TABS
50.0000 mg | ORAL_TABLET | Freq: Every day | ORAL | Status: DC
  Administered 2016-11-08: 50 mg via ORAL
  Filled 2016-11-07 (×5): qty 1

## 2016-11-07 MED ORDER — ALUM & MAG HYDROXIDE-SIMETH 200-200-20 MG/5ML PO SUSP
30.0000 mL | ORAL | Status: DC | PRN
  Administered 2016-11-11 – 2016-11-12 (×2): 30 mL via ORAL
  Filled 2016-11-07 (×2): qty 30

## 2016-11-07 MED ORDER — DIVALPROEX SODIUM 250 MG PO DR TAB
250.0000 mg | DELAYED_RELEASE_TABLET | Freq: Two times a day (BID) | ORAL | Status: DC
  Administered 2016-11-07 – 2016-11-08 (×3): 250 mg via ORAL
  Filled 2016-11-07 (×7): qty 1

## 2016-11-07 MED ORDER — ACETAMINOPHEN 325 MG PO TABS: 650.0000 mg | ORAL_TABLET | Freq: Four times a day (QID) | ORAL | Status: DC | PRN
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
## 2016-11-07 NOTE — ED Notes (Signed)
Patient reports SI with a plan to hang himself or over dose. Patient currently denies HI and AVH. Plan of care discussed. Snack given. Encouragement and support provided and safety maintain. Q 15 min safety checks in place and video monitoring.

## 2016-11-07 NOTE — Progress Notes (Signed)
Pt attended the evening NA speaker meeting. Pt was engaged and appropriate.  Vondell Babers C, NT 11/07/16 8:38 PM  

## 2016-11-07 NOTE — Progress Notes (Signed)
Per Donell SievertSpencer, Simon PA recommend a.m. Psych evaluation Colon Rueth K. Sherlon HandingHarris, LCAS-A, LPC-A, [facility]  Counselor 11/07/2016 1:04 AM

## 2016-11-07 NOTE — BH Assessment (Signed)
[facility] Assessment Progress Note  Per Thedore MinsMojeed Akintayo, MD, this pt requires psychiatric hospitalization at this time.  Malva LimesLinsey Strader, RN, [facility]; they will be ready to receive pt at 13:00.  Pt has signed Voluntary Admission and Consent for Treatment, as well as Consent to Release Information to his mother and to [facility], his outpatient provider, and a notification call has been placed to the latter.  [facility].  Pt's nurse, Roni< has been notified, and agrees to send original paperwork along with pt via [place], and to call report to [phone].  Doylene Canninghomas Amaiyah Nordhoff, MA Triage Specialist [phone]

## 2016-11-07 NOTE — ED Notes (Signed)
Patient has been resting in his room.  Patient was noted to be isolative with a blunted affect.  Patient reported increased depressive and suicidal thoughts, but has no identifiable plan to harm self. [facility]

## 2016-11-07 NOTE — Progress Notes (Signed)
D: Patient denies SI/HI and A/V hallucinations; patient reports stress due to his probation violation and that he was [facility] [employer] [facility] and unable to complete his GED;  A: Admission completed  R: Patient is flat and sad; patient introduced to the room; patient had no immediate needs; patient informed that Lanora Manislizabeth will be his nurse

## 2016-11-08 DIAGNOSIS — F3130 Bipolar disorder, current episode depressed, mild or moderate severity, unspecified: Secondary | ICD-10-CM

## 2016-11-08 DIAGNOSIS — Z818 Family history of other mental and behavioral disorders: Secondary | ICD-10-CM

## 2016-11-08 DIAGNOSIS — Z79899 Other long term (current) drug therapy: Secondary | ICD-10-CM

## 2016-11-08 DIAGNOSIS — R45851 Suicidal ideations: Secondary | ICD-10-CM

## 2016-11-08 LAB — VALPROIC ACID LEVEL: Valproic Acid Lvl: 16 ug/mL — ABNORMAL LOW (ref 50.0–100.0)

## 2016-11-08 MED ORDER — QUETIAPINE FUMARATE 100 MG PO TABS
100.0000 mg | ORAL_TABLET | Freq: Every day | ORAL | Status: DC
  Administered 2016-11-08 – 2016-11-11 (×4): 100 mg via ORAL
  Filled 2016-11-08 (×5): qty 1

## 2016-11-08 MED ORDER — QUETIAPINE FUMARATE ER 50 MG PO TB24
50.0000 mg | ORAL_TABLET | Freq: Four times a day (QID) | ORAL | Status: DC | PRN
Start: 2016-11-08 — End: 2016-11-12
  Filled 2016-11-08: qty 1

## 2016-11-08 MED ORDER — DIVALPROEX SODIUM ER 250 MG PO TB24
250.0000 mg | ORAL_TABLET | Freq: Two times a day (BID) | ORAL | Status: DC
  Administered 2016-11-08 – 2016-11-12 (×8): 250 mg via ORAL
  Filled 2016-11-08 (×10): qty 1

## 2016-11-08 NOTE — Social Work (Signed)
Referred to [facility], is [place] resident.  Cordelia Bessinger, [name] Lead Clinical Social Worker Phone:  [phone]  

## 2016-11-08 NOTE — [facility] Suicide Risk Assessment (Signed)
[facility] INPATIENT:  Family/Significant Other Suicide Prevention Education  Suicide Prevention Education:  Contact Attempts: Celene SkeenLeslie Mol (mom [phone]), has been identified by the patient as the family member/significant other with whom the patient will be residing, and identified as the person(s) who will aid the patient in the event of a mental health crisis.  With written consent from the patient, two attempts were made to provide suicide prevention education, prior to and/or following the patient's discharge.  We were unsuccessful in providing suicide prevention education.  A suicide education pamphlet was given to the patient to share with family/significant other.  Date and time of first attempt: 11/08/16 @ 3:45PM. No answer, left a message.  Jonathon JordanLynn B Aidaly Cordner 11/08/2016, 3:48 PM
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
## 2016-11-08 NOTE — Plan of Care (Signed)
Problem: Safety: Goal: Periods of time without injury will increase Outcome: Progressing Patient contracts for safety on the unit. Patient is on Level 3 q15 minute safety checks.   

## 2016-11-08 NOTE — Tx Team (Signed)
Interdisciplinary Treatment and Diagnostic Plan Update 11/08/2016 Time of Session: 9:30am  Ryan Bartlett  MRN: [id number]  Principal Diagnosis: bipolar disorder, suicidality  Secondary Diagnoses: Active Problems:   Suicidal thoughts   Current Medications:  Current Facility-Administered Medications  Medication Dose Route Frequency Provider Last Rate Last Dose  . acetaminophen (TYLENOL) tablet 650 mg  650 mg Oral Q6H PRN Kerrie Buffalo, NP      . alum & mag hydroxide-simeth (MAALOX/MYLANTA) 200-200-20 MG/5ML suspension 30 mL  30 mL Oral Q4H PRN Kerrie Buffalo, NP      . divalproex (DEPAKOTE ER) 24 hr tablet 250 mg  250 mg Oral BID Sharma Covert, MD      . divalproex (DEPAKOTE) DR tablet 250 mg  250 mg Oral Q12H Kerrie Buffalo, NP   250 mg at 11/08/16 0932  . hydrOXYzine (ATARAX/VISTARIL) tablet 25 mg  25 mg Oral TID PRN Kerrie Buffalo, NP   25 mg at 11/08/16 1255  . magnesium hydroxide (MILK OF MAGNESIA) suspension 30 mL  30 mL Oral Daily PRN Kerrie Buffalo, NP      . nicotine polacrilex (NICORETTE) gum 2 mg  2 mg Oral PRN Kerrie Buffalo, NP   2 mg at 11/07/16 2056  . QUEtiapine (SEROQUEL XR) 24 hr tablet 50 mg  50 mg Oral Q6H PRN Sharma Covert, MD      . QUEtiapine (SEROQUEL) tablet 100 mg  100 mg Oral QHS Sharma Covert, MD      . traZODone (DESYREL) tablet 50 mg  50 mg Oral QHS Kerrie Buffalo, NP        PTA Medications: Prescriptions Prior to Admission  Medication Sig Dispense Refill Last Dose  . divalproex (DEPAKOTE ER) 500 MG 24 hr tablet Take 2 tablets (1,000 mg total) by mouth daily. 60 tablet 0 Past Week at Unknown time  . hydrOXYzine (ATARAX/VISTARIL) 25 MG tablet Take 1 tablet (25 mg total) by mouth every 6 (six) hours as needed for anxiety. 90 tablet 0 Past Week at Unknown time  . nicotine polacrilex (NICORETTE) 2 MG gum Take 1 each (2 mg total) by mouth as needed for smoking cessation. 100 tablet 0 unknown  . paliperidone (INVEGA SUSTENNA) 234 MG/1.5ML SUSP  injection Inject 234 mg into the muscle every 30 (thirty) days.   Past Month at Unknown time  . QUEtiapine (SEROQUEL) 100 MG tablet Take 1 tablet (100 mg total) by mouth at bedtime. 30 tablet 0 Past Week at Unknown time  . QUEtiapine (SEROQUEL) 50 MG tablet Take 1 tablet (50 mg total) by mouth every 8 (eight) hours as needed (agitation). 60 tablet 0 Past Week at Unknown time    Treatment Modalities: Medication Management, Group therapy, Case management,  1 to 1 session with clinician, Psychoeducation, Recreational therapy.  Patient Stressors: Financial difficulties Medication change or noncompliance Substance abuse Patient Strengths: Ability for insight Active sense of humor Average or above average intelligence Capable of independent living  Physician Treatment Plan for Primary Diagnosis: bipolar disorder, suicidality Long Term Goal(s): Improvement in symptoms so as ready for discharge Short Term Goals: Ability to identify changes in lifestyle to reduce recurrence of condition will improve Ability to verbalize feelings will improve Ability to disclose and discuss suicidal ideas Ability to demonstrate self-control will improve Ability to identify and develop effective coping behaviors will improve Ability to maintain clinical measurements within normal limits will improve Compliance with prescribed medications will improve Ability to identify triggers associated with substance abuse/mental health issues will improve Ability  to identify changes in lifestyle to reduce recurrence of condition will improve Ability to verbalize feelings will improve Ability to disclose and discuss suicidal ideas Ability to demonstrate self-control will improve Ability to identify and develop effective coping behaviors will improve Ability to maintain clinical measurements within normal limits will improve Compliance with prescribed medications will improve Ability to identify triggers associated with  substance abuse/mental health issues will improve  Medication Management: Evaluate patient's response, side effects, and tolerance of medication regimen.  Therapeutic Interventions: 1 to 1 sessions, Unit Group sessions and Medication administration.  Evaluation of Outcomes: Not Met  Physician Treatment Plan for Secondary Diagnosis: Active Problems:   Suicidal thoughts  Long Term Goal(s): Improvement in symptoms so as ready for discharge  Short Term Goals: Ability to identify changes in lifestyle to reduce recurrence of condition will improve Ability to verbalize feelings will improve Ability to disclose and discuss suicidal ideas Ability to demonstrate self-control will improve Ability to identify and develop effective coping behaviors will improve Ability to maintain clinical measurements within normal limits will improve Compliance with prescribed medications will improve Ability to identify triggers associated with substance abuse/mental health issues will improve Ability to identify changes in lifestyle to reduce recurrence of condition will improve Ability to verbalize feelings will improve Ability to disclose and discuss suicidal ideas Ability to demonstrate self-control will improve Ability to identify and develop effective coping behaviors will improve Ability to maintain clinical measurements within normal limits will improve Compliance with prescribed medications will improve Ability to identify triggers associated with substance abuse/mental health issues will improve  Medication Management: Evaluate patient's response, side effects, and tolerance of medication regimen.  Therapeutic Interventions: 1 to 1 sessions, Unit Group sessions and Medication administration.  Evaluation of Outcomes: Not Met  RN Treatment Plan for Primary Diagnosis: bipolar disorder, suicidality Long Term Goal(s): Knowledge of disease and therapeutic regimen to maintain health will  improve  Short Term Goals: Ability to disclose and discuss suicidal ideas, Ability to identify and develop effective coping behaviors will improve and Compliance with prescribed medications will improve  Medication Management: RN will administer medications as ordered by provider, will assess and evaluate patient's response and provide education to patient for prescribed medication. RN will report any adverse and/or side effects to prescribing provider.  Therapeutic Interventions: 1 on 1 counseling sessions, Psychoeducation, Medication administration, Evaluate responses to treatment, Monitor vital signs and CBGs as ordered, Perform/monitor CIWA, COWS, AIMS and Fall Risk screenings as ordered, Perform wound care treatments as ordered.  Evaluation of Outcomes: Not Met  [name] Treatment Plan for Primary Diagnosis: bipolar disorder, suicidality Long Term Goal(s): Safe transition to appropriate next level of care at discharge, Engage patient in therapeutic group addressing interpersonal concerns. Short Term Goals: Engage patient in aftercare planning with referrals and resources, Increase ability to appropriately verbalize feelings, Identify triggers associated with mental health/substance abuse issues and Increase skills for wellness and recovery  Therapeutic Interventions: Assess for all discharge needs, 1 to 1 time with Social worker, Explore available resources and support systems, Assess for adequacy in community support network, Educate family and significant other(s) on suicide prevention, Complete Psychosocial Assessment, Interpersonal group therapy.  Evaluation of Outcomes: Not Met  Progress in Treatment: Attending groups: Pt is new to milieu, continuing to assess  Participating in groups: Pt is new to milieu, continuing to assess  Taking medication as prescribed: Yes, MD continues to assess for medication changes as needed Toleration medication: Yes, no side effects reported at this  time  Family/Significant other contact made: No, CSW assessing for appropriate contact Patient understands diagnosis: Continuing to assess Discussing patient identified problems/goals with staff: Yes Medical problems stabilized or resolved: Yes Denies suicidal/homicidal ideation:  Issues/concerns per patient self-inventory: None Other: N/A  New problem(s) identified: None identified at this time.   New Short Term/Long Term Goal(s): None identified at this time.   Discharge Plan or Barriers: Pt will return home and follow-up with [facility].  Reason for Continuation of Hospitalization:   Depression Medication stabilization Suicidal ideation  Estimated Length of Stay: 3-5 days  Attendees: Patient: 11/08/2016 3:14 PM  Physician: Dr. Mallie Darting 11/08/2016 3:14 PM  Nursing: Chrys Racer RN; Igo, RN 11/08/2016 3:14 PM  RN Care Manager: Lars Pinks, RN 11/08/2016 3:14 PM  Social Worker: Maxie Better, [name]; Matthew Saras, [place] 11/08/2016 3:14 PM  Recreational Therapist:  11/08/2016 3:14 PM  Other: Lindell Spar, NP; Samuel Jester, NP 11/08/2016 3:14 PM  Other:  11/08/2016 3:14 PM  Other: 11/08/2016 3:14 PM   Scribe for Treatment Team: Georga Kaufmann, MSW,LCSWA 11/08/2016 3:14 PM

## 2016-11-08 NOTE — [facility] Suicide Risk Assessment (Signed)
[facility]   Nursing information obtained from:  Patient Demographic factors:  Male, Adolescent or young adult, Low socioeconomic status, Unemployed Current Mental Status:  Self-harm thoughts Loss Factors:  Legal issues Historical Factors:  Prior suicide attempts, Family history of mental illness or substance abuse Risk Reduction Factors:  Sense of responsibility to family, Religious beliefs about death  Total Time spent with patient: 45 minutes Principal Problem: Bipolar vs schizoaffective disorder, THC dependence Diagnosis:   Patient Active Problem List   Diagnosis Date Noted  . Suicidal thoughts [R45.851] 11/07/2016  . Bipolar 2 disorder (HCC) [F31.81] 08/08/2016  . 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 Bipolar affective disorder, current episode depressed (HCC) [F31.30] 03/08/2016  . Bipolar affect, depressed (HCC) [F31.30] 09/18/2015  . Bipolar affective disorder, currently depressed, mild (HCC) [F31.31] 09/17/2015  . Intentional overdose of drug in tablet form (HCC) [T50.902A]   . Major depressive disorder, recurrent episode, moderate (HCC) [F33.1] 07/09/2015  . Depression [F32.9]   . Suicidal ideation [R45.851]   . Cannabis use disorder, moderate, dependence (HCC) [F12.20] 06/06/2015  . Alcohol use disorder, moderate, dependence (HCC) [F10.20] 06/06/2015  . Bipolar I disorder, most recent episode depressed (HCC) [F31.30] 05/23/2013  . Cannabis abuse [F12.10] 03/30/2012  . ADHD (attention deficit hyperactivity disorder), combined type [F90.2] 08/28/2011  . Conduct disorder, adolescent onset type [F91.2] 08/28/2011   Subjective Data: patient is a 21 YO male with the above stated PPHx who was admitted for suicidal ideation.  Continued Clinical Symptoms:  Alcohol Use Disorder Identification Test Final Score (AUDIT): 0 The "Alcohol Use Disorders Identification Test", Guidelines for Use in Primary Care, Second Edition.  World Science writerHealth Organization  [facility](WHO). Score between 0-7:  no or low risk or alcohol related problems. Score between 8-15:  moderate risk of alcohol related problems. Score between 16-19:  high risk of alcohol related problems. Score 20 or above:  warrants further diagnostic evaluation for alcohol dependence and treatment.   CLINICAL FACTORS:   Bipolar Disorder:   Depressive phase Alcohol/Substance Abuse/Dependencies Schizophrenia:   Less than 23 years old   Musculoskeletal: Strength & Muscle Tone: within normal limits Gait & Station: normal Patient leans: N/A  Psychiatric Specialty Exam: Physical Exam  Nursing note and vitals reviewed.   ROS  Blood pressure 125/85, pulse 85, temperature 98.1 F (36.7 C), temperature source Oral, resp. rate 16, height 5' 9.5" (1.765 m), weight 100.2 kg (221 lb).Body mass index is 32.17 kg/m.  General Appearance: Disheveled  Eye Contact:  Fair  Speech:  Normal Rate  Volume:  Decreased  Mood:  Anxious  Affect:  Appropriate  Thought Process:  Coherent  Orientation:  Full (Time, Place, and Person)  Thought Content:  Logical  Suicidal Thoughts:  Yes.  without intent/plan  Homicidal Thoughts:  No  Memory:  Immediate;   Fair  Judgement:  Poor  Insight:  Lacking  Psychomotor Activity:  Normal  Concentration:  Concentration: Fair  Recall:  [employer]  Fund of Knowledge:  Fair  Language:  Fair  Akathisia:  No  Handed:  Right  AIMS (if indicated):     Assets:  Desire for Improvement Resilience  ADL's:  Intact  Cognition:  WNL  Sleep:  Number of Hours: 6      COGNITIVE FEATURES THAT CONTRIBUTE TO RISK:  Closed-mindedness and Thought constriction (tunnel vision)    SUICIDE RISK:   Mild:  Suicidal ideation of limited frequency, intensity, duration, and specificity.  There are no identifiable plans, no associated intent, mild dysphoria  and related symptoms, good self-control (both objective and subjective assessment), few other risk factors, and identifiable protective  factors, including available and accessible social support.  PLAN OF CARE: Patient is a 21 YO male with the above stated PPHx who is admitted for SI  I certify that inpatient services furnished can reasonably be expected to improve the patient's condition.   Antonieta Pert, MD 11/08/2016, 8:30 AM
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
## 2016-11-08 NOTE — Tx Team (Signed)
Initial Treatment Plan 11/08/2016 6:37 AM Ryan ChattersAustin T Bartlett ZOX:[id number]    PATIENT STRESSORS: Financial difficulties Medication change or noncompliance Substance abuse   PATIENT STRENGTHS: Ability for insight Active sense of humor Average or above average intelligence Capable of independent living   PATIENT IDENTIFIED PROBLEMS: "my anger issues"  "stay on my meds"  Depression  Substance Abuse  Suicidal Thoughts             DISCHARGE CRITERIA:  Ability to meet basic life and health needs Adequate post-discharge living arrangements Improved stabilization in mood, thinking, and/or behavior Medical problems require only outpatient monitoring Motivation to continue treatment in a less acute level of care Need for constant or close observation no longer present Reduction of life-threatening or endangering symptoms to within safe limits Safe-care adequate arrangements made Verbal commitment to aftercare and medication compliance Withdrawal symptoms are absent or subacute and managed without 24-hour nursing intervention  PRELIMINARY DISCHARGE PLAN: Outpatient therapy  PATIENT/FAMILY INVOLVEMENT: This treatment plan has been presented to and reviewed with the patient, Ryan Bartlett.  The patient and family have been given the opportunity to ask questions and make suggestions.  Ferrel LoganAmanda A Aundreya Souffrant, RN 11/08/2016, 6:37 AM

## 2016-11-08 NOTE — [facility] Group Notes (Signed)
[facility] [name] Group Therapy 11/08/2016 1:15pm  Type of Therapy: Group Therapy- Balance in Life  Participation Level: Pt invited. Did not attend.  Jonathon JordanLynn B Coe Angelos, MSW, LCSWA 11/08/2016 3:03 PM

## 2016-11-08 NOTE — Progress Notes (Signed)
Nursing Progress Note 7p-7a  D) Patient presents with anxious affect. Patient is minimal with Clinical research associatewriter. Patient is seen interactive in the milieu. Patient observed by writer asking MHT multiple times "do you want to see my new tattoo?" Patient redirected by MHT. Patient denies SI/HI/AVH or pain. Patient contracts for safety on the unit.   A) Emotional support given. 1:1 interaction and active listening provided. Patient medicated with PM orders as prescribed. Medications reviewed with patient. Patient verbalized understanding of medications without further questions.  Snacks and fluids provided. Opportunities for questions or concerns presented to patient. Patient encouraged to continue to work on treatment goals. Labs, vital signs and patient behavior monitored throughout shift. Patient safety maintained with q15 min safety checks. Patient denied need for trazodone.  R) Patient receptive to interaction with nurse. Patient remains safe on the unit at this time. Patient denies any adverse medication reactions at this time. Patient is resting in bed without complaints. Will continue to monitor.

## 2016-11-08 NOTE — Progress Notes (Signed)
DAR NOTE: Patient presents with anxious affect and mood.  Denies pain, auditory and visual hallucinations.  Described energy level as normal and concentration as good.  Maintained on routine safety checks.  Medications given as prescribed.  Support and encouragement offered as needed.  Patient observed socializing with peers in the dayroom.  Vistaril 25 mg given for complain of anxiety with good effect.  Offered no complaint.  Patient is safe on the unit.  Patient is visible in milieu.

## 2016-11-09 MED ORDER — IBUPROFEN 600 MG PO TABS
ORAL_TABLET | ORAL | Status: AC
  Filled 2016-11-09: qty 1

## 2016-11-09 MED ORDER — IBUPROFEN 600 MG PO TABS
600.0000 mg | ORAL_TABLET | Freq: Four times a day (QID) | ORAL | Status: DC | PRN
  Administered 2016-11-09: 600 mg via ORAL

## 2016-11-09 MED ORDER — TRAZODONE HCL 50 MG PO TABS: 50.0000 mg | ORAL_TABLET | Freq: Every evening | ORAL | Status: DC | PRN
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
## 2016-11-09 NOTE — Progress Notes (Signed)
The patient attended the vast majority of the A.A.meeting but did walk out. He was observed having side conversations during the meeting.

## 2016-11-09 NOTE — Progress Notes (Cosign Needed)
Adult Psychoeducational Group Note  Date:  11/09/2016  Time:  11:20 AM  Group Topic/Focus:  Recovery Goals:   The focus of this group is to identify appropriate goals for recovery and establish a plan to achieve them.  Participation Level:  None  Participation Quality:  Attentive  Affect:  Appropriate  Cognitive:  Appropriate  Insight: None  Engagement in Group:  Improving  Modes of Intervention:  Discussion  Additional Comments pt was in group but did not participate in discussion.  Jemmie Ledgerwood R Jaaliyah Lucatero 11/09/2016, 11:20 AM

## 2016-11-09 NOTE — [facility] Group Notes (Signed)
[facility] [name] Group Therapy  11/09/2016 2:53 PM  Type of Therapy:  Group Therapy  Participation Level: DID NOT ATTEND. Pt invited. Chose to remain in bed.   Summary of Progress/Problems:  Finding Balance in Life. Today's group focused on defining balance in one's own words, identifying things that can knock one off balance, and exploring healthy ways to maintain balance in life. Group members were asked to provide an example of a time when they felt off balance, describe how they handled that situation,and process healthier ways to regain balance in the future. Group members were asked to share the most important tool for maintaining [facility] and how they plan to apply this method after discharge.   Mayu Ronk N Smart [name] 11/09/2016, 2:53 PM

## 2016-11-09 NOTE — Progress Notes (Signed)
Recreation Therapy Notes  Date: 11/09/16 Time: 0930 Location: 300 [address] Dayroom  Group Topic: Stress Management  Goal Area(s) Addresses:  Patient will verbalize importance of using healthy stress management.  Patient will identify positive emotions associated with healthy stress management.   Intervention: Stress Management  Activity :  Meditation.  LRT introduced the stress management technique of meditation.  LRT played a meditation on gratitude from the Calm app.  Patients were to follow along as the meditation played to engage in the meditation.  Education:  Stress Management, Discharge Planning.   Education Outcome: Acknowledges edcuation/In group clarification offered/Needs additional education  Clinical Observations/Feedback: Pt did not attend group.   Maclean Foister, LRT/CTRS         Charelle Petrakis A 11/09/2016 12:15 PM 

## 2016-11-09 NOTE — Progress Notes (Signed)
Nursing Progress Note: 7p-7a D: Pt currently presents with a anxious/pleasant affect and behavior. Pt states "I'm feeling very anxious today." Interacting appropriately with milieu. Pt reports good sleep with current medication regimen.   A: Pt provided with medications per providers orders. Pt's labs and vitals were monitored throughout the night. Pt supported emotionally and encouraged to express concerns and questions. Pt educated on medications.  R: Pt's safety ensured with 15 minute and environmental checks. Pt currently denies SI/HI/Self Harm and AVH. Pt verbally contracts to seek staff if SI/HI or A/VH occurs and to consult with staff before acting on any harmful thoughts. Will continue to monitor.'

## 2016-11-09 NOTE — Progress Notes (Signed)
[facility]  11/09/2016 1:03 PM Maureen Chattersustin T Stejskal  MRN:  [id number] Subjective:  Patient is a 21 YO male Principal Problem: schizoaffective disorder, suicidality, THC dependence Diagnosis:   Patient Active Problem List   Diagnosis Date Noted  . Suicidal thoughts [R45.851] 11/07/2016  . Bipolar 2 disorder (HCC) [F31.81] 08/08/2016  . 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 Bipolar affective disorder, current episode depressed (HCC) [F31.30] 03/08/2016  . Bipolar affect, depressed (HCC) [F31.30] 09/18/2015  . Bipolar affective disorder, currently depressed, mild (HCC) [F31.31] 09/17/2015  . Intentional overdose of drug in tablet form (HCC) [T50.902A]   . Major depressive disorder, recurrent episode, moderate (HCC) [F33.1] 07/09/2015  . Depression [F32.9]   . Suicidal ideation [R45.851]   . Cannabis use disorder, moderate, dependence (HCC) [F12.20] 06/06/2015  . Alcohol use disorder, moderate, dependence (HCC) [F10.20] 06/06/2015  . Bipolar I disorder, most recent episode depressed (HCC) [F31.30] 05/23/2013  . Cannabis abuse [F12.10] 03/30/2012  . ADHD (attention deficit hyperactivity disorder), combined type [F90.2] 08/28/2011  . Conduct disorder, adolescent onset type [F91.2] 08/28/2011   Total Time spent with patient: 20 minutes  Past Psychiatric History: see H&P  Past Medical History:  Past Medical History:  Diagnosis Date  . ADHD (attention deficit hyperactivity disorder)   . Arthritis   . Asthma   . Bipolar disorder (HCC)   . Depression   . Oppositional defiant disorder   . Unspecified episodic mood disorder     Past Surgical History:  Procedure Laterality Date  . NO PAST SURGERIES     Family History:  Family History  Problem Relation Age of Onset  . Depression Mother   . Diabetes Other   . Hyperlipidemia Other   . Hypertension Other    Family Psychiatric  History: see H&P Social History:  History  Alcohol Use  . 3.6 oz/week  . 6 Cans of beer  per week    Comment: "I actually recently quit"     History  Drug Use  . Types: Marijuana    Social History   Social History  . Marital status: Single    Spouse name: N/A  . Number of children: N/A  . Years of education: N/A   Occupational History  . student Minor    12th grade at [employer]   Social History Main Topics  . Smoking status: Current Every Day Smoker    Packs/day: 0.50    Years: 0.00    Types: Cigarettes  . Smokeless tobacco: Never Used  . Alcohol use 3.6 oz/week    6 Cans of beer per week     Comment: "I actually recently quit"  . Drug use: Yes    Types: Marijuana  . Sexual activity: Not Currently    Partners: Female     Comment: Pt reports that he is not sexually active   Other Topics Concern  . None   Social History Narrative  . None   Additional Social History:                         Sleep: Good  Appetite:  Fair  Current Medications: Current Facility-Administered Medications  Medication Dose Route Frequency Provider Last Rate Last Dose  . acetaminophen (TYLENOL) tablet 650 mg  650 mg Oral Q6H PRN Adonis BrookSheila Agustin, NP      . alum & mag hydroxide-simeth (MAALOX/MYLANTA) 200-200-20 MG/5ML suspension 30 mL  30 mL Oral Q4H PRN Adonis BrookSheila Agustin, NP      .  divalproex (DEPAKOTE ER) 24 hr tablet 250 mg  250 mg Oral BID Antonieta Pert, MD   250 mg at 11/09/16 1050  . hydrOXYzine (ATARAX/VISTARIL) tablet 25 mg  25 mg Oral TID PRN Adonis Brook, NP   25 mg at 11/08/16 1255  . magnesium hydroxide (MILK OF MAGNESIA) suspension 30 mL  30 mL Oral Daily PRN Adonis Brook, NP      . nicotine polacrilex (NICORETTE) gum 2 mg  2 mg Oral PRN Adonis Brook, NP   2 mg at 11/09/16 1051  . QUEtiapine (SEROQUEL XR) 24 hr tablet 50 mg  50 mg Oral Q6H PRN Antonieta Pert, MD      . QUEtiapine (SEROQUEL) tablet 100 mg  100 mg Oral QHS Antonieta Pert, MD   100 mg at 11/08/16 2219  . traZODone (DESYREL) tablet 50 mg  50 mg Oral QHS PRN Antonieta Pert, MD         Lab Results:  Results for orders placed or performed during the hospital encounter of 11/07/16 (from the past 48 hour(s))  Valproic acid level     Status: Abnormal   Collection Time: 11/08/16  6:22 PM  Result Value Ref Range   Valproic Acid Lvl 16 (L) 50.0 - 100.0 ug/mL    Comment: Performed at [facility], [address] [place], [place] [address]    Blood Alcohol level:  Lab Results  Component Value Date   [facility] <5 11/06/2016   ETH <5 06/21/2016    Metabolic Disorder Labs: Lab Results  Component Value Date   HGBA1C 5.6 08/11/2016   MPG 114 08/11/2016   MPG 117 08/10/2016   Lab Results  Component Value Date   PROLACTIN 65.2 (H) 08/09/2016   Lab Results  Component Value Date   CHOL 181 08/11/2016   TRIG 116 08/11/2016   HDL 43 08/11/2016   CHOLHDL 4.2 08/11/2016   VLDL 23 08/11/2016   LDLCALC 115 (H) 08/11/2016   LDLCALC 126 (H) 08/10/2016    Physical Findings: AIMS: Facial and Oral Movements Muscles of Facial Expression: None, normal Lips and Perioral Area: None, normal Jaw: None, normal Tongue: None, normal,Extremity Movements Upper (arms, wrists, hands, fingers): None, normal Lower (legs, knees, ankles, toes): None, normal, Trunk Movements Neck, shoulders, hips: None, normal, Overall Severity Severity of abnormal movements (highest score from questions above): None, normal Incapacitation due to abnormal movements: None, normal Patient's awareness of abnormal movements (rate only patient's report): No Awareness, Dental Status Current problems with teeth and/or dentures?: No Does patient usually wear dentures?: No  CIWA:    COWS:     Musculoskeletal: Strength & Muscle Tone: within normal limits Gait & Station: normal Patient leans: N/A  Psychiatric Specialty Exam: Physical Exam  Nursing note and vitals reviewed.   ROS  Blood pressure 110/69, pulse 89, temperature 97.8 F (36.6 C), resp. rate 16, height 5' 9.5" (1.765 m),  weight 100.2 kg (221 lb).Body mass index is 32.17 kg/m.  General Appearance: Disheveled  Eye Contact:  Minimal  Speech:  Slow  Volume:  Decreased  Mood:  sedated  Affect:  Congruent  Thought Process:  Goal Directed  Orientation:  NA  Thought Content:  Logical  Suicidal Thoughts:  Yes.  without intent/plan  Homicidal Thoughts:  No  Memory:  Negative  Judgement:  Impaired  Insight:  Lacking  Psychomotor Activity:  Normal  Concentration:  Concentration: Fair  Recall:  [employer] of Knowledge:  Fair  Language:  Fair  Akathisia:  No  Handed:  Right  AIMS (if indicated):     Assets:  Desire for Improvement  ADL's:  Intact  Cognition:  WNL  Sleep:  Number of Hours: 6.5     Treatment Plan Summary: Daily contact with patient to assess and evaluate symptoms and progress in treatment, Medication management and Plan Patient is seen and examined.Patient on examination this am is significantly oversedated. He took the seroquel with the trazodone last night and was asleep at 11 am. We discussed this and I am going to change the trazodone to prn but continue the seroquel at current dosage. I have spoken to him about contacting his ACTT service but I am unsure whether he will carry this out today, Hopefully he will be more awake and alert tomorrow to assess.  Antonieta Pert, MD 11/09/2016, 1:03 PM

## 2016-11-10 ENCOUNTER — Encounter (HOSPITAL_COMMUNITY): Payer: Self-pay | Admitting: Registered Nurse

## 2016-11-10 DIAGNOSIS — F314 Bipolar disorder, current episode depressed, severe, without psychotic features: Secondary | ICD-10-CM

## 2016-11-10 NOTE — [facility] Group Notes (Signed)
[facility] Group Notes:  (Nursing/MHT/Case Management/Adjunct)  Date:  11/10/2016  Time:  1315  Type of Therapy:  Nurse Education  Participation Level:  Did Not Attend  Participation Quality:    Affect:    Cognitive:    Insight:    Engagement in Group:    Modes of Intervention:    Summary of Progress/Problems: Patient was invited however he elected not to attend group.  Merian CapronFriedman, Louretta Tantillo [facility] 11/10/2016, 1400
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
## 2016-11-10 NOTE — Progress Notes (Signed)
D: Patient up and visible in the milieu after sleeping until mid-morning. States the seroquel is still making him sleepy however he feels less sedated than yesterday. Also feels his body will adjust and states seroquel has worked in the past. Patient rates depression at a 3/10, hopelessness at a 1/10 and anxiety at a 3/10. Rates sleep as good, appetite as good, energy as normal and concentration as good Patient's affect appropriate with congruent mood. Some anxiety noted. Denies pain, physical problems. Asking when unit restriction will expire.  A: Medicated per orders, no prns required or requested. Emotional support offered and self inventory reviewed. Encouraged completion of Suicide Safety Plan.  Explained to patient restriction would be lifted at the 24 hour mark and he would be allowed to go to dinner with peers. Explained acceptable behavior and what expectations are.   R: Patient verbalizes understanding of POC.  Patient denies SI/HI and remains safe on level III obs.

## 2016-11-10 NOTE — [facility] Group Notes (Signed)
[facility] [name] Group Therapy Note  11/10/2016 at 10:00 AM  Type of Therapy and Topic:  Group Therapy: Avoiding Self-Sabotaging and Enabling Behaviors  Participation Level:  None  Participation Quality:  Drowsy  Affect:  Flat  Insight:  None shared  Engagement in Therapy:  None noted   Therapeutic models used: Cognitive Behavioral Therapy,  Person-Centered Therapy and Motivational Interviewing  Modes of Intervention:  Discussion, Exploration, Orientation, Rapport Building, Socialization and Support   Summary of Progress/Problems:  The main focus of today's process group was for the patient to identify ways in which they have in the past sabotaged their own recovery. Motivational Interviewing was utilized to identify motivation they may have for wanting to change. The Stages of Change were explained using a handout, and patients identified where they currently are with regard to stages of change. Patient arrived late and was present only physically as he choose not to share.    Carney Bernatherine C Jaeleen Inzunza, [name]

## 2016-11-10 NOTE — Plan of Care (Signed)
Problem: Medication: Goal: Compliance with prescribed medication regimen will improve Outcome: Progressing Patient is med compliant.  Problem: Education: Goal: Verbalization of understanding the information provided will improve Outcome: Progressing Patient verbalizes understanding of information, education provided.   
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
## 2016-11-11 NOTE — [facility] Group Notes (Signed)
[facility] Group Notes:  (Nursing/MHT/Case Management/Adjunct)  Date:  11/11/2016  Time:  1300  Type of Therapy:  Nurse Education  Participation Level:  Did Not Attend  Participation Quality:    Affect:    Cognitive:    Insight:    Engagement in Group:    Modes of Intervention:    Summary of Progress/Problems: Patient invited to attend however elected to remain in bed.  Merian CapronFriedman, Payten Beaumier [facility] 11/11/2016, 3:42 PM

## 2016-11-11 NOTE — Plan of Care (Signed)
Problem: Safety: Goal: Periods of time without injury will increase Outcome: Progressing Pt. remains a low fall risk, denies SI/HI/AVH at this time, Q 15 checks in place.    

## 2016-11-11 NOTE — Progress Notes (Signed)
D: Patient up and visible in the milieu this morning though by self report and observation is quite sleepy. Spoke with patient 1:1 who was eating breakfast in the dayroom. Rating depression at a 3/10, hopelessness at a 1/10 and anxiety at a 3/10. Rates sleep as good, appetite as good, energy as low and concentration as poor. Patient's affect flat, mood depressed. Patient cannot identify a goal at this time. Denies pain, physical problems.   A: Medicated per orders, no prns requested or required. Emotional support offered and self inventory reviewed. Encouraged completion of Suicide Safety Plan. Reminded patient of appropriate behavior in milieu and off unit. Also encouraged patient to identify a goal and suggested some that would be pertinent to his care.   R: Patient verbalizes understanding of POC. States, "my goal right now is just to wake up from the seroquel. I'm sure I will feel better later."  Patient denies SI/HI and remains safe on level III obs.

## 2016-11-11 NOTE — Plan of Care (Signed)
Problem: Self-Concept: Goal: Ability to disclose and discuss suicidal ideas will improve Outcome: Progressing Patient denying SI, verbalizes agreement to contact staff should that change.  Problem: Coping: Goal: Ability to demonstrate self-control will improve Outcome: Progressing Although patient had verbal altercation with peers on Friday, he has been appropriate and cooperative since.

## 2016-11-11 NOTE — Progress Notes (Signed)
D: Pt at the time of assessment was alert and oriented x 4. Pt denied depression, anxiety, pain, SI, HI or AVH; states, "I feel better everyday; I can feel my medicines working." Pt observed interacting with peers in the dayroom. Pt was flat however; calm and cooperative A: Medications offered as prescribed. All patient's questions and concerns addressed. Support, encouragement, and safe environment provided. 15-minute safety checks continue.   R: Pt was med compliant. Pt attended AA group. Safety checks continue.

## 2016-11-11 NOTE — Progress Notes (Signed)
[facility]  11/11/2016 3:15 PM Ryan Bartlett  MRN:  [id number]   Subjective:  Patient state "I'm sleepy"  Objective:   Ryan Bartlett 21 y.o. male patient seen by this provider.  Chart reviewed 11/11/16.   On evaluation:  Ryan Bartlett is in bed after lunch.  Reports that he is feeling sleepy but slept well last night.   At this time he denies suicidal/homicidal ideation, psychosis, and paranoia.  Tolerating medication, eating well, and attending/participating in group sessions   Principal Problem: schizoaffective disorder, suicidality, THC dependence Diagnosis:   Patient Active Problem List   Diagnosis Date Noted  . Suicidal thoughts [R45.851] 11/07/2016  . Bipolar 2 disorder (HCC) [F31.81] 08/08/2016  . 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 Bipolar affective disorder, current episode depressed (HCC) [F31.30] 03/08/2016  . Bipolar affect, depressed (HCC) [F31.30] 09/18/2015  . Bipolar affective disorder, currently depressed, mild (HCC) [F31.31] 09/17/2015  . Intentional overdose of drug in tablet form (HCC) [T50.902A]   . Major depressive disorder, recurrent episode, moderate (HCC) [F33.1] 07/09/2015  . Depression [F32.9]   . Suicidal ideation [R45.851]   . Cannabis use disorder, moderate, dependence (HCC) [F12.20] 06/06/2015  . Alcohol use disorder, moderate, dependence (HCC) [F10.20] 06/06/2015  . Bipolar I disorder, most recent episode depressed (HCC) [F31.30] 05/23/2013  . Cannabis abuse [F12.10] 03/30/2012  . ADHD (attention deficit hyperactivity disorder), combined type [F90.2] 08/28/2011  . Conduct disorder, adolescent onset type [F91.2] 08/28/2011   Total Time spent with patient: 15 minutes  Past Psychiatric History: see H&P  Past Medical History:  Past Medical History:  Diagnosis Date  . ADHD (attention deficit hyperactivity disorder)   . Arthritis   . Asthma   . Bipolar disorder (HCC)   . Depression   . Oppositional defiant disorder   .  Unspecified episodic mood disorder     Past Surgical History:  Procedure Laterality Date  . NO PAST SURGERIES     Family History:  Family History  Problem Relation Age of Onset  . Depression Mother   . Diabetes Other   . Hyperlipidemia Other   . Hypertension Other    Family Psychiatric  History: see H&P Social History:  History  Alcohol Use  . 3.6 oz/week  . 6 Cans of beer per week    Comment: "I actually recently quit"     History  Drug Use  . Types: Marijuana    Social History   Social History  . Marital status: Single    Spouse name: N/A  . Number of children: N/A  . Years of education: N/A   Occupational History  . student Minor    12th grade at [employer]   Social History Main Topics  . Smoking status: Current Every Day Smoker    Packs/day: 0.50    Years: 0.00    Types: Cigarettes  . Smokeless tobacco: Never Used  . Alcohol use 3.6 oz/week    6 Cans of beer per week     Comment: "I actually recently quit"  . Drug use: Yes    Types: Marijuana  . Sexual activity: Not Currently    Partners: Female     Comment: Pt reports that he is not sexually active   Other Topics Concern  . None   Social History Narrative  . None   Additional Social History:   Sleep: Good  Appetite:  Good  Current Medications: Current Facility-Administered Medications  Medication Dose Route Frequency Provider Last Rate Last  Dose  . acetaminophen (TYLENOL) tablet 650 mg  650 mg Oral Q6H PRN Adonis Brook, NP      . alum & mag hydroxide-simeth (MAALOX/MYLANTA) 200-200-20 MG/5ML suspension 30 mL  30 mL Oral Q4H PRN Adonis Brook, NP   30 mL at 11/11/16 0143  . divalproex (DEPAKOTE ER) 24 hr tablet 250 mg  250 mg Oral BID Antonieta Pert, MD   250 mg at 11/11/16 0831  . hydrOXYzine (ATARAX/VISTARIL) tablet 25 mg  25 mg Oral TID PRN Adonis Brook, NP   25 mg at 11/09/16 2239  . ibuprofen (ADVIL,MOTRIN) tablet 600 mg  600 mg Oral Q6H PRN Adonis Brook, NP   600 mg at 11/09/16  1546  . magnesium hydroxide (MILK OF MAGNESIA) suspension 30 mL  30 mL Oral Daily PRN Adonis Brook, NP      . nicotine polacrilex (NICORETTE) gum 2 mg  2 mg Oral PRN Adonis Brook, NP   2 mg at 11/11/16 1129  . QUEtiapine (SEROQUEL XR) 24 hr tablet 50 mg  50 mg Oral Q6H PRN Antonieta Pert, MD      . QUEtiapine (SEROQUEL) tablet 100 mg  100 mg Oral QHS Antonieta Pert, MD   100 mg at 11/10/16 2218  . traZODone (DESYREL) tablet 50 mg  50 mg Oral QHS PRN Antonieta Pert, MD        Lab Results:  No results found for this or any previous visit (from the past 48 hour(s)).  Blood Alcohol level:  Lab Results  Component Value Date   [facility] <5 11/06/2016   ETH <5 06/21/2016    Metabolic Disorder Labs: Lab Results  Component Value Date   HGBA1C 5.6 08/11/2016   MPG 114 08/11/2016   MPG 117 08/10/2016   Lab Results  Component Value Date   PROLACTIN 65.2 (H) 08/09/2016   Lab Results  Component Value Date   CHOL 181 08/11/2016   TRIG 116 08/11/2016   HDL 43 08/11/2016   CHOLHDL 4.2 08/11/2016   VLDL 23 08/11/2016   LDLCALC 115 (H) 08/11/2016   LDLCALC 126 (H) 08/10/2016    Physical Findings: AIMS: Facial and Oral Movements Muscles of Facial Expression: None, normal Lips and Perioral Area: None, normal Jaw: None, normal Tongue: None, normal,Extremity Movements Upper (arms, wrists, hands, fingers): None, normal Lower (legs, knees, ankles, toes): None, normal, Trunk Movements Neck, shoulders, hips: None, normal, Overall Severity Severity of abnormal movements (highest score from questions above): None, normal Incapacitation due to abnormal movements: None, normal Patient's awareness of abnormal movements (rate only patient's report): No Awareness, Dental Status Current problems with teeth and/or dentures?: No Does patient usually wear dentures?: No  CIWA:    COWS:     Musculoskeletal: Strength & Muscle Tone: within normal limits Gait & Station: normal Patient leans:  N/A  Psychiatric Specialty Exam: Physical Exam  Nursing note and vitals reviewed.   ROS  Blood pressure 119/87, pulse 92, temperature 98.3 F (36.8 C), temperature source Oral, resp. rate 20, height 5' 9.5" (1.765 m), weight 100.2 kg (221 lb).Body mass index is 32.17 kg/m.  General Appearance: Fairly Groomed  Eye Contact:  Good  Speech:  Slow  Volume:  Normal  Mood:  Anxious  Affect:  Congruent  Thought Process:  Goal Directed  Orientation:  NA  Thought Content:  Logical  Suicidal Thoughts:  Yes.  without intent/plan Denies at this time  Homicidal Thoughts:  No  Memory:  Negative  Judgement:  Impaired  Insight:  Lacking  Psychomotor Activity:  Normal  Concentration:  Concentration: Fair  Recall:  [employer] of Knowledge:  Fair  Language:  Fair  Akathisia:  No  Handed:  Right  AIMS (if indicated):     Assets:  Desire for Improvement  ADL's:  Intact  Cognition:  WNL  Sleep:  Number of Hours: 6.25     Treatment Plan Summary: Daily contact with patient to assess and evaluate symptoms and progress in treatment, Medication management and Plan Patient is seen and examined. Continue Seroquel XR 50 mg and Seroquel 100 mg Q hs for mood stabilization and depression Continue Trazodone for Insomnia  No changes in treatment plan at this time  Rankin, Denice Bors, NP 11/11/2016, 3:14 PM  Notes reviewed and agree with plan

## 2016-11-11 NOTE — [facility] Group Notes (Signed)
[facility] [name] Group Therapy Note   11/11/2016 at 10 - 11 AM  Type of Therapy and Topic: Group Therapy: Feelings Around Returning Home & Establishing a Supportive Framework and Activity to Identify signs of Improvement or Decompensation   Participation Level: Did Not attend; invited to participate yet did not despite overhead announcement and encouragement by staff   Description of Group:  Patients first processed thoughts and feelings about up coming discharge. These included fears of upcoming changes, lack of change, new living environments, judgements and expectations from others and overall stigma of MH issues. We then discussed what is a supportive framework? What does it look like feel like and how do I discern it from and unhealthy non-supportive network? Learn how to cope when supports are not helpful and don't support you. Discuss what to do when your family/friends are not supportive.    Senon Nixon C Ryden Wainer, [name] 

## 2016-11-12 ENCOUNTER — Encounter (HOSPITAL_COMMUNITY): Payer: Self-pay | Admitting: Psychiatry

## 2016-11-12 DIAGNOSIS — F1290 Cannabis use, unspecified, uncomplicated: Secondary | ICD-10-CM

## 2016-11-12 DIAGNOSIS — F17210 Nicotine dependence, cigarettes, uncomplicated: Secondary | ICD-10-CM

## 2016-11-12 MED ORDER — QUETIAPINE FUMARATE 100 MG PO TABS: 100.0000 mg | ORAL_TABLET | Freq: Every day | ORAL | 0 refills | Status: DC

## 2016-11-12 MED ORDER — DIVALPROEX SODIUM ER 250 MG PO TB24: 250.0000 mg | ORAL_TABLET | Freq: Two times a day (BID) | ORAL | 0 refills | Status: DC

## 2016-11-12 MED ORDER — HYDROXYZINE HCL 25 MG PO TABS: 25.0000 mg | ORAL_TABLET | Freq: Three times a day (TID) | ORAL | 0 refills | Status: DC | PRN

## 2016-11-12 MED ORDER — TRAZODONE HCL 50 MG PO TABS: 50.0000 mg | ORAL_TABLET | Freq: Every evening | ORAL | 0 refills | Status: DC | PRN

## 2016-11-12 MED ORDER — QUETIAPINE FUMARATE 50 MG PO TABS: ORAL_TABLET | ORAL | 0 refills | Status: DC

## 2016-11-12 MED ORDER — NICOTINE POLACRILEX 2 MG MT GUM: 2.0000 mg | CHEWING_GUM | OROMUCOSAL | 0 refills | Status: DC | PRN

## 2016-11-12 NOTE — Progress Notes (Signed)
Patient attended the evening A.A. meeting and was appropriate.  

## 2016-11-12 NOTE — Progress Notes (Signed)
  [facility] :  Will you be returning to the same living situation after discharge:  Yes,  home with grandmother At discharge, do you have transportation home?: Yes,  ACT team notified/message left; bus pass in chart Do you have the ability to pay for your medications: Yes,  medicaid  Release of information consent forms completed and submitted to medical records by CSW.  Patient to Follow up at: Follow-up Information    [facility] ACT Team Follow up.   Why:  Your ACT Team will come to see you at your home on the day that you discharge. Contact information: [address] [place] , [phone] P: [phone] (weekday) P: [phone] (weekend) F:          Next level of care provider has [facility] Link:no  Safety Planning and Suicide Prevention discussed: Yes,  SPE completed with pt's mother. SPI pamphlet and Mobile Crisis information provided.  Have you used any form of tobacco in the last 30 days? (Cigarettes, Smokeless Tobacco, Cigars, and/or Pipes): Yes  Has patient been referred to the Quitline?: Patient refused referral  Patient has been referred for addiction treatment: Yes  Sumner Kirchman N Smart [name] 11/12/2016, 9:52 AM

## 2016-11-12 NOTE — Progress Notes (Signed)
Recreation Therapy Notes  Date: 11/12/16 Time: 0930 Location: [address] Dayroom  Group Topic: Stress Management  Goal Area(s) Addresses:  Patient will verbalize importance of using healthy stress management.  Patient will identify positive emotions associated with healthy stress management.   Intervention: Stress Management  Activity :  [place].  LRT introduced the stress management technique of guided imagery.  LRT read a script that allowed patients to take a "mental vacation" by envisioning themselves in a meadow.  Patients were to follow along with the LRT as the script was read to engage in the technique.  Education:  Stress Management, Discharge Planning.   Education Outcome: Acknowledges edcuation/In group clarification offered/Needs additional education  Clinical Observations/Feedback: Pt did not attend group.    Dasie Chancellor, LRT/CTRS         Milee Qualls A 11/12/2016 11:44 AM 

## 2016-11-12 NOTE — Progress Notes (Signed)
  DATA ACTION RESPONSE  Objective- Pt. is visible in the milieu, seen interacting/joking with peers in the hallway. Pt. presents with a blunted affect and mood. Pt. was minimal with Clinical research associatewriter. Subjective- Denies having any SI/HI/AVH/Pain at this time. Pt. states " It's been alright". Pt. continues to be cooperative and remain safe on the unit.  1:1 interaction in private to establish rapport. Encouragement, education, & support given from staff. Meds. ordered and administered.   Safety maintained with Q 15 checks. Continues to follow treatment plan and will monitor closely. No additonal questions/concerns noted.

## 2016-11-12 NOTE — Progress Notes (Signed)
Patient ID: Ryan Bartlett, male   DOB: 1995-11-27, 21 y.o.   MRN: [id number]  Discharge Note: Pt. Denies SI/HI and A/V hallucinations. Patient refused to fill out his daily inventory sheet. He reports feeling well rested. He received PRN Nicotine gum and Maalox in the morning. He reported relief with those interventions. Belongings returned to patient at time of discharge. Patient denies any pain or discomfort. Discharge instructions and medications were reviewed with patient. Patient verbalized understanding of both medications and discharge instructions. Patient was discharged to lobby where his ACTT was waiting. Q15 minute safety checks were maintained until discharge. No distress upon discharge.
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
## 2016-11-12 NOTE — Tx Team (Signed)
Interdisciplinary Treatment and Diagnostic Plan Update 11/12/2016 Time of Session: 9:30am  Ryan Bartlett  MRN: [id number]  Principal Diagnosis: bipolar disorder, suicidality  Secondary Diagnoses: Principal Problem:   Bipolar affective disorder, current episode depressed ([place]) Active Problems:   Suicidal thoughts   Current Medications:  Current Facility-Administered Medications  Medication Dose Route Frequency Provider Last Rate Last Dose  . acetaminophen (TYLENOL) tablet 650 mg  650 mg Oral Q6H PRN Kerrie Buffalo, NP      . alum & mag hydroxide-simeth (MAALOX/MYLANTA) 200-200-20 MG/5ML suspension 30 mL  30 mL Oral Q4H PRN Kerrie Buffalo, NP   30 mL at 11/11/16 0143  . divalproex (DEPAKOTE ER) 24 hr tablet 250 mg  250 mg Oral BID Sharma Covert, MD   250 mg at 11/12/16 0847  . hydrOXYzine (ATARAX/VISTARIL) tablet 25 mg  25 mg Oral TID PRN Kerrie Buffalo, NP   25 mg at 11/09/16 2239  . ibuprofen (ADVIL,MOTRIN) tablet 600 mg  600 mg Oral Q6H PRN Kerrie Buffalo, NP   600 mg at 11/09/16 1546  . magnesium hydroxide (MILK OF MAGNESIA) suspension 30 mL  30 mL Oral Daily PRN Kerrie Buffalo, NP      . nicotine polacrilex (NICORETTE) gum 2 mg  2 mg Oral PRN Kerrie Buffalo, NP   2 mg at 11/11/16 1129  . QUEtiapine (SEROQUEL XR) 24 hr tablet 50 mg  50 mg Oral Q6H PRN Sharma Covert, MD      . QUEtiapine (SEROQUEL) tablet 100 mg  100 mg Oral QHS Sharma Covert, MD   100 mg at 11/11/16 2226  . traZODone (DESYREL) tablet 50 mg  50 mg Oral QHS PRN Sharma Covert, MD        PTA Medications: Prescriptions Prior to Admission  Medication Sig Dispense Refill Last Dose  . divalproex (DEPAKOTE ER) 500 MG 24 hr tablet Take 2 tablets (1,000 mg total) by mouth daily. 60 tablet 0 Past Week at Unknown time  . hydrOXYzine (ATARAX/VISTARIL) 25 MG tablet Take 1 tablet (25 mg total) by mouth every 6 (six) hours as needed for anxiety. 90 tablet 0 Past Week at Unknown time  . nicotine polacrilex  (NICORETTE) 2 MG gum Take 1 each (2 mg total) by mouth as needed for smoking cessation. 100 tablet 0 unknown  . paliperidone (INVEGA SUSTENNA) 234 MG/1.5ML SUSP injection Inject 234 mg into the muscle every 30 (thirty) days.   Past Month at Unknown time  . QUEtiapine (SEROQUEL) 100 MG tablet Take 1 tablet (100 mg total) by mouth at bedtime. 30 tablet 0 Past Week at Unknown time  . QUEtiapine (SEROQUEL) 50 MG tablet Take 1 tablet (50 mg total) by mouth every 8 (eight) hours as needed (agitation). 60 tablet 0 Past Week at Unknown time    Treatment Modalities: Medication Management, Group therapy, Case management,  1 to 1 session with clinician, Psychoeducation, Recreational therapy.  Patient Stressors: Financial difficulties Medication change or noncompliance Substance abuse Patient Strengths: Ability for insight Active sense of humor Average or above average intelligence Capable of independent living  Physician Treatment Plan for Primary Diagnosis: bipolar disorder, suicidality Long Term Goal(s): Improvement in symptoms so as ready for discharge Short Term Goals: Ability to identify changes in lifestyle to reduce recurrence of condition will improve Ability to verbalize feelings will improve Ability to disclose and discuss suicidal ideas Ability to demonstrate self-control will improve Ability to identify and develop effective coping behaviors will improve Ability to maintain clinical measurements within normal  limits will improve Compliance with prescribed medications will improve Ability to identify triggers associated with substance abuse/mental health issues will improve Ability to identify changes in lifestyle to reduce recurrence of condition will improve Ability to verbalize feelings will improve Ability to disclose and discuss suicidal ideas Ability to demonstrate self-control will improve Ability to identify and develop effective coping behaviors will improve Ability to  maintain clinical measurements within normal limits will improve Compliance with prescribed medications will improve Ability to identify triggers associated with substance abuse/mental health issues will improve  Medication Management: Evaluate patient's response, side effects, and tolerance of medication regimen.  Therapeutic Interventions: 1 to 1 sessions, Unit Group sessions and Medication administration.  Evaluation of Outcomes: Met  Physician Treatment Plan for Secondary Diagnosis: Principal Problem:   Bipolar affective disorder, current episode depressed ([place]) Active Problems:   Suicidal thoughts  Long Term Goal(s): Improvement in symptoms so as ready for discharge  Short Term Goals: Ability to identify changes in lifestyle to reduce recurrence of condition will improve Ability to verbalize feelings will improve Ability to disclose and discuss suicidal ideas Ability to demonstrate self-control will improve Ability to identify and develop effective coping behaviors will improve Ability to maintain clinical measurements within normal limits will improve Compliance with prescribed medications will improve Ability to identify triggers associated with substance abuse/mental health issues will improve Ability to identify changes in lifestyle to reduce recurrence of condition will improve Ability to verbalize feelings will improve Ability to disclose and discuss suicidal ideas Ability to demonstrate self-control will improve Ability to identify and develop effective coping behaviors will improve Ability to maintain clinical measurements within normal limits will improve Compliance with prescribed medications will improve Ability to identify triggers associated with substance abuse/mental health issues will improve  Medication Management: Evaluate patient's response, side effects, and tolerance of medication regimen.  Therapeutic Interventions: 1 to 1 sessions, Unit Group sessions  and Medication administration.  Evaluation of Outcomes: Met  RN Treatment Plan for Primary Diagnosis: bipolar disorder, suicidality Long Term Goal(s): Knowledge of disease and therapeutic regimen to maintain health will improve  Short Term Goals: Ability to disclose and discuss suicidal ideas, Ability to identify and develop effective coping behaviors will improve and Compliance with prescribed medications will improve  Medication Management: RN will administer medications as ordered by provider, will assess and evaluate patient's response and provide education to patient for prescribed medication. RN will report any adverse and/or side effects to prescribing provider.  Therapeutic Interventions: 1 on 1 counseling sessions, Psychoeducation, Medication administration, Evaluate responses to treatment, Monitor vital signs and CBGs as ordered, Perform/monitor CIWA, COWS, AIMS and Fall Risk screenings as ordered, Perform wound care treatments as ordered.  Evaluation of Outcomes: Met  [name] Treatment Plan for Primary Diagnosis: bipolar disorder, suicidality Long Term Goal(s): Safe transition to appropriate next level of care at discharge, Engage patient in therapeutic group addressing interpersonal concerns. Short Term Goals: Engage patient in aftercare planning with referrals and resources, Increase ability to appropriately verbalize feelings, Identify triggers associated with mental health/substance abuse issues and Increase skills for wellness and recovery  Therapeutic Interventions: Assess for all discharge needs, 1 to 1 time with Social worker, Explore available resources and support systems, Assess for adequacy in community support network, Educate family and significant other(s) on suicide prevention, Complete Psychosocial Assessment, Interpersonal group therapy.  Evaluation of Outcomes: Met  Progress in Treatment: Attending groups: Intermittently  Participating in groups: Yes, when he  attends  Taking medication as prescribed: Yes Toleration  medication: Yes, no side effects reported at this time Family/Significant other contact made: SPE completed with pt's mother.  Patient understands diagnosis: Yes Discussing patient identified problems/goals with staff: Yes Medical problems stabilized or resolved: Yes Denies suicidal/homicidal ideation:  Issues/concerns per patient self-inventory: None Other: N/A  New problem(s) identified: None identified at this time.   New Short Term/Long Term Goal(s): None identified at this time.   Discharge Plan or Barriers: Pt will return home and follow-up with [facility].  Reason for Continuation of Hospitalization:   none  Estimated Length of Stay: discharge today   Attendees: Patient: 11/12/2016 9:55 AM  Physician: Dr. Shea Evans MD 11/12/2016 9:55 AM  Nursing: Chestine Spore RN 11/12/2016 9:55 AM  RN Care Manager: Lars Pinks, RN 11/12/2016 9:55 AM  Social Worker: [place],  11/12/2016 9:55 AM  Recreational Therapist: Rhunette Croft 11/12/2016 9:55 AM  Other: Lindell Spar, NP; Samuel Jester, NP 11/12/2016 9:55 AM  Other:  11/12/2016 9:55 AM  Other: 11/12/2016 9:55 AM   Scribe for Treatment Team: Maxie Better, MSW, [name] Clinical Social Worker 11/12/2016 9:56 AM

## 2016-11-12 NOTE — [facility] Suicide Risk Assessment (Signed)
[facility] Discharge Suicide Risk Assessment   Principal Problem: Bipolar I disorder, most recent episode depressed [facility]) Discharge Diagnoses:  Patient Active Problem List   Diagnosis Date Noted  . Suicidal thoughts [R45.851] 11/07/2016  . Bipolar 2 disorder (HCC) [F31.81] 08/08/2016  . 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 Bipolar affect, depressed (HCC) [F31.30] 09/18/2015  . Bipolar affective disorder, currently depressed, mild (HCC) [F31.31] 09/17/2015  . Intentional overdose of drug in tablet form (HCC) [T50.902A]   . Depression [F32.9]   . Suicidal ideation [R45.851]   . Cannabis use disorder, moderate, dependence (HCC) [F12.20] 06/06/2015  . Alcohol use disorder, moderate, dependence (HCC) [F10.20] 06/06/2015  . Bipolar I disorder, most recent episode depressed (HCC) [F31.30] 05/23/2013  . Cannabis abuse [F12.10] 03/30/2012  . ADHD (attention deficit hyperactivity disorder), combined type [F90.2] 08/28/2011  . Conduct disorder, adolescent onset type [F91.2] 08/28/2011    Total Time spent with patient: 30 minutes  Musculoskeletal: Strength & Muscle Tone: within normal limits Gait & Station: normal Patient leans: N/A  Psychiatric Specialty Exam: Review of Systems  Psychiatric/Behavioral: Positive for substance abuse. Negative for depression and hallucinations.  All other systems reviewed and are negative.   Blood pressure 112/72, pulse 90, temperature 97.8 F (36.6 C), temperature source Oral, resp. rate 16, height 5' 9.5" (1.765 m), weight 100.2 kg (221 lb).Body mass index is 32.17 kg/m.  General Appearance: Casual  Eye Contact::  Fair  Speech:  Clear and Coherent409  Volume:  Normal  Mood:  Euthymic  Affect:  Congruent  Thought Process:  Goal Directed and Descriptions of Associations: Intact  Orientation:  Full (Time, Place, and Person)  Thought Content:  Logical  Suicidal Thoughts:  No  Homicidal Thoughts:  No  Memory:  Immediate;   Fair Recent;    Fair Remote;   Fair  Judgement:  Fair  Insight:  Fair  Psychomotor Activity:  Normal  Concentration:  Fair  Recall:  [employer] of Knowledge:Fair  Language: Fair  Akathisia:  No  Handed:  Right  AIMS (if indicated):     Assets:  Communication Skills Desire for Improvement  Sleep:  Number of Hours: 6  Cognition: WNL  ADL's:  Intact   Mental Status Per Nursing Assessment::   On Admission:  Self-harm thoughts  Demographic Factors:  Male  Loss Factors: NA  Historical Factors: Impulsivity  Risk Reduction Factors:   Positive therapeutic relationship  Continued Clinical Symptoms:  Alcohol/Substance Abuse/Dependencies Previous Psychiatric Diagnoses and Treatments  Cognitive Features That Contribute To Risk:  None    Suicide Risk:  Minimal: No identifiable suicidal ideation.  Patients presenting with no risk factors but with morbid ruminations; may be classified as minimal risk based on the severity of the depressive symptoms  Follow-up Information    [facility] ACT Team Follow up on 11/12/2016.   Why:  Your ACT Team will come to see you at your home on the day that you discharge. Social worker left message with ACT team to notify them of you discharge this morning.  Contact information: [address] Der[place] B[place], [address] P: [phone] (weekday) P: [phone] (weekend) F: [phone]          Plan Of Care/Follow-up recommendations:  Activity:  no restrictions Diet:  regular Tests:  Depakote level tomorrow AM - with ACTT, pt aware to let ACTT know Other:  none  Ryan Labrador, MD 11/12/2016, 11:13 AM

## 2016-12-05 ENCOUNTER — Encounter (HOSPITAL_COMMUNITY): Payer: Self-pay | Admitting: Emergency Medicine

## 2016-12-05 ENCOUNTER — Emergency Department (HOSPITAL_COMMUNITY)
Admission: EM | Admit: 2016-12-05 | Discharge: 2016-12-07 | Disposition: A | Discharge status: Home / Self Care | Payer: Medicaid Other | Attending: Emergency Medicine | Admitting: Emergency Medicine

## 2016-12-05 DIAGNOSIS — F314 Bipolar disorder, current episode depressed, severe, without psychotic features: Secondary | ICD-10-CM | POA: Diagnosis not present

## 2016-12-05 DIAGNOSIS — J45909 Unspecified asthma, uncomplicated: Secondary | ICD-10-CM | POA: Diagnosis not present

## 2016-12-05 DIAGNOSIS — F909 Attention-deficit hyperactivity disorder, unspecified type: Secondary | ICD-10-CM | POA: Diagnosis not present

## 2016-12-05 DIAGNOSIS — Z91010 Allergy to peanuts: Secondary | ICD-10-CM | POA: Insufficient documentation

## 2016-12-05 DIAGNOSIS — F1290 Cannabis use, unspecified, uncomplicated: Secondary | ICD-10-CM | POA: Diagnosis not present

## 2016-12-05 DIAGNOSIS — F3130 Bipolar disorder, current episode depressed, mild or moderate severity, unspecified: Secondary | ICD-10-CM | POA: Diagnosis not present

## 2016-12-05 DIAGNOSIS — Z818 Family history of other mental and behavioral disorders: Secondary | ICD-10-CM | POA: Diagnosis not present

## 2016-12-05 DIAGNOSIS — F17210 Nicotine dependence, cigarettes, uncomplicated: Secondary | ICD-10-CM | POA: Diagnosis not present

## 2016-12-05 DIAGNOSIS — R45851 Suicidal ideations: Secondary | ICD-10-CM

## 2016-12-05 DIAGNOSIS — Z79899 Other long term (current) drug therapy: Secondary | ICD-10-CM | POA: Insufficient documentation

## 2016-12-05 DIAGNOSIS — T50902A Poisoning by unspecified drugs, medicaments and biological substances, intentional self-harm, initial encounter: Secondary | ICD-10-CM | POA: Diagnosis not present

## 2016-12-05 LAB — CBC
HCT: 39.2 % (ref 39.0–52.0)
HEMOGLOBIN: 13.6 g/dL (ref 13.0–17.0)
MCH: 28.2 pg (ref 26.0–34.0)
MCHC: 34.7 g/dL (ref 30.0–36.0)
MCV: 81.2 fL (ref 78.0–100.0)
Platelets: 201 10*3/uL (ref 150–400)
RBC: 4.83 MIL/uL (ref 4.22–5.81)
RDW: 13.1 % (ref 11.5–15.5)
WBC: 9.2 10*3/uL (ref 4.0–10.5)

## 2016-12-05 LAB — CBG MONITORING, ED: GLUCOSE-CAPILLARY: 83 mg/dL (ref 65–99)

## 2016-12-05 MED ORDER — SODIUM CHLORIDE 0.9 % IV SOLN
INTRAVENOUS | Status: DC
  Administered 2016-12-06: 02:00:00 via INTRAVENOUS

## 2016-12-05 MED ORDER — SODIUM CHLORIDE 0.9 % IV BOLUS (SEPSIS)
1000.0000 mL | Freq: Once | INTRAVENOUS | Status: AC
  Administered 2016-12-05: 1000 mL via INTRAVENOUS

## 2016-12-05 NOTE — ED Triage Notes (Signed)
Pt brought in by EMS after snorting cocaine and taking 4 green and white pills he got from a friend but does not know what they are  Pt states he uses cocaine recreationally  Pt is c/o headache  Pt states he had a lot going through his head all at the same time  Pt states he was intentionally trying to hurt himself

## 2016-12-05 NOTE — ED Provider Notes (Addendum)
[facility]-EMERGENCY DEPT Provider Note   CSN: [id number] Arrival date & time: 12/05/16  2245  Time seen 23:01 PM    History   Chief Complaint Chief Complaint  Patient presents with  . Drug Overdose    HPI Ryan Bartlett is a 21 y.o. male.  HPI  patient has a history of bipolar and schizoaffective disorder. He reports about 30 minutes prior to coming to the emergency room his roommate witnessed him taking about 10 green and white oval capsules that he is unaware what they were plus he did cocaine. He states "I tried to kill myself". When asked why he tried to kill himself he states he has "a lot going on". He states "I don't know how to handle it". He states his great grandmother recently died in his father doesn't think that the patient is his son. His roommate called EMS after witnessing him take the pills. Patient states he still feels suicidal. Patient was last admitted to a psychiatric facility in March for suicidal ideation. He states he did feel better when he was discharged. He states he is taking his prescribed medication. However he states it's not helping now. Patient states he has an ACT team however he did not call them when he was in crisis today.  PCP none Psychiatry Dr Cherylann Ratel ACT team doctor.  Past Medical History:  Diagnosis Date  . ADHD (attention deficit hyperactivity disorder)   . Arthritis   . Asthma   . Bipolar disorder (HCC)   . Depression   . Oppositional defiant disorder   . Unspecified episodic mood disorder     Patient Active Problem List   Diagnosis Date Noted  . Suicidal thoughts 11/07/2016  . Bipolar 2 disorder (HCC) 08/08/2016  . 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 Bipolar affect, depressed (HCC) 09/18/2015  . Bipolar affective disorder, currently depressed, mild (HCC) 09/17/2015  . Intentional overdose of drug in tablet form (HCC)   . Depression   . Suicidal ideation   . Cannabis use disorder, moderate, dependence (HCC) 06/06/2015  .  Alcohol use disorder, moderate, dependence (HCC) 06/06/2015  . Bipolar I disorder, most recent episode depressed (HCC) 05/23/2013  . Cannabis abuse 03/30/2012  . ADHD (attention deficit hyperactivity disorder), combined type 08/28/2011  . Conduct disorder, adolescent onset type 08/28/2011    Past Surgical History:  Procedure Laterality Date  . NO PAST SURGERIES         Home Medications    Prior to Admission medications   Medication Sig Start Date End Date Taking? Authorizing Provider  divalproex (DEPAKOTE ER) 250 MG 24 hr tablet Take 1 tablet (250 mg total) by mouth 2 (two) times daily. For mood stabilization 11/12/16  Yes Sanjuana Kava, NP  QUEtiapine (SEROQUEL) 100 MG tablet Take 1 tablet (100 mg total) by mouth at bedtime. For mood control 11/12/16  Yes Sanjuana Kava, NP  QUEtiapine (SEROQUEL) 50 MG tablet Take 1 tablet (50 mg) every 6 hours as needed: For agitation 11/12/16  Yes Sanjuana Kava, NP  hydrOXYzine (ATARAX/VISTARIL) 25 MG tablet Take 1 tablet (25 mg total) by mouth 3 (three) times daily as needed for anxiety. Patient not taking: Reported on 12/06/2016 11/12/16   Sanjuana Kava, NP  nicotine polacrilex (NICORETTE) 2 MG gum Take 1 each (2 mg total) by mouth as needed for smoking cessation. Patient not taking: Reported on 12/06/2016 11/12/16   Sanjuana Kava, NP  traZODone (DESYREL) 50 MG tablet Take 1 tablet (50 mg total)  by mouth at bedtime as needed for sleep. Patient not taking: Reported on 12/06/2016 11/12/16   Sanjuana Kava, NP    Family History Family History  Problem Relation Age of Onset  . Depression Mother   . Diabetes Other   . Hyperlipidemia Other   . Hypertension Other     Social History Social History  Substance Use Topics  . Smoking status: Current Every Day Smoker    Packs/day: 0.50    Years: 0.00    Types: Cigarettes  . Smokeless tobacco: Never Used  . Alcohol use No  disability for bipolar and schizoaffective   Allergies   Peanut-containing drug  products and Lactose intolerance (gi)   Review of Systems Review of Systems  All other systems reviewed and are negative.    Physical Exam Updated Vital Signs BP 125/76 (BP Location: Right Arm)   Pulse 70   Temp 98 F (36.7 C) (Oral)   Resp 18   SpO2 98%   Vital signs normal    Physical Exam  Constitutional: He is oriented to person, place, and time. He appears well-developed and well-nourished.  Non-toxic appearance. He does not appear ill. No distress.  HENT:  Head: Normocephalic and atraumatic.  Right Ear: External ear normal.  Left Ear: External ear normal.  Nose: Nose normal. No mucosal edema or rhinorrhea.  Mouth/Throat: Oropharynx is clear and moist. Mucous membranes are dry. No dental abscesses or uvula swelling.  Eyes: EOM are normal. Pupils are equal, round, and reactive to light. Right conjunctiva is injected. Left conjunctiva is injected.  Neck: Normal range of motion and full passive range of motion without pain. Neck supple.  Cardiovascular: Normal rate, regular rhythm and normal heart sounds.  Exam reveals no gallop and no friction rub.   No murmur heard. Pulmonary/Chest: Effort normal and breath sounds normal. No respiratory distress. He has no wheezes. He has no rhonchi. He has no rales. He exhibits no tenderness and no crepitus.  Abdominal: Soft. Normal appearance and bowel sounds are normal. He exhibits no distension. There is no tenderness. There is no rebound and no guarding.  Musculoskeletal: Normal range of motion. He exhibits no edema or tenderness.  Moves all extremities well.   Neurological: He is alert and oriented to person, place, and time. He has normal strength. No cranial nerve deficit.  Skin: Skin is warm, dry and intact. No rash noted. No erythema. No pallor.  Psychiatric: He has a normal mood and affect. His speech is normal and behavior is normal. His mood appears not anxious.  Nursing note and vitals reviewed.    ED Treatments / Results   Labs (all labs ordered are listed, but only abnormal results are displayed) Results for orders placed or performed during the hospital encounter of 12/05/16  Comprehensive metabolic panel  Result Value Ref Range   Sodium 137 135 - 145 mmol/L   Potassium 3.9 3.5 - 5.1 mmol/L   Chloride 106 101 - 111 mmol/L   CO2 24 22 - 32 mmol/L   Glucose, Bld 97 65 - 99 mg/dL   BUN 9 6 - 20 mg/dL   Creatinine, Ser 4.09 0.61 - 1.24 mg/dL   Calcium 9.2 8.9 - 81.1 mg/dL   Total Protein 7.0 6.5 - 8.1 g/dL   Albumin 4.2 3.5 - 5.0 g/dL   AST 17 15 - 41 U/L   ALT 14 (L) 17 - 63 U/L   Alkaline Phosphatase 55 38 - 126 U/L   Total Bilirubin 0.5  0.3 - 1.2 mg/dL   GFR calc non Af Amer >60 >60 mL/min   GFR calc Af Amer >60 >60 mL/min   Anion gap 7 5 - 15  Ethanol  Result Value Ref Range   Alcohol, Ethyl (B) <5 <5 mg/dL  Salicylate level  Result Value Ref Range   Salicylate Lvl <7.0 2.8 - 30.0 mg/dL  Acetaminophen level  Result Value Ref Range   Acetaminophen (Tylenol), Serum <10 (L) 10 - 30 ug/mL  cbc  Result Value Ref Range   WBC 9.2 4.0 - 10.5 K/uL   RBC 4.83 4.22 - 5.81 MIL/uL   Hemoglobin 13.6 13.0 - 17.0 g/dL   HCT 40.9 81.1 - 91.4 %   MCV 81.2 78.0 - 100.0 fL   MCH 28.2 26.0 - 34.0 pg   MCHC 34.7 30.0 - 36.0 g/dL   RDW 78.2 95.6 - 21.3 %   Platelets 201 150 - 400 K/uL  Rapid urine drug screen (hospital performed)  Result Value Ref Range   Opiates NONE DETECTED NONE DETECTED   Cocaine POSITIVE (A) NONE DETECTED   Benzodiazepines NONE DETECTED NONE DETECTED   Amphetamines NONE DETECTED NONE DETECTED   Tetrahydrocannabinol NONE DETECTED NONE DETECTED   Barbiturates NONE DETECTED NONE DETECTED  Valproic acid level  Result Value Ref Range   Valproic Acid Lvl <10 (L) 50.0 - 100.0 ug/mL  CBG monitoring, ED  Result Value Ref Range   Glucose-Capillary 83 65 - 99 mg/dL   Laboratory interpretation all normal except subtherapeutic valproic acid level, + UDS for cocaine    EKG  EKG  Interpretation  Date/Time:  Wednesday December 05 2016 23:19:00 EDT Ventricular Rate:  71 PR Interval:    QRS Duration: 91 QT Interval:  405 QTC Calculation: 441 R Axis:   50 Text Interpretation:  Sinus rhythm RSR' in V1 or V2, probably normal variant Borderline T wave abnormalities No significant change since last tracing 16 Sep 2015 Confirmed by [facility]  MD-I, Jaritza Duignan ([id number]) on 12/05/2016 11:40:33 PM       Radiology No results found.  Procedures Procedures (including critical care time)  Medications Ordered in ED Medications  0.9 %  sodium chloride infusion ( Intravenous New Bag/Given 12/06/16 0156)  acetaminophen (TYLENOL) tablet 650 mg (not administered)  ibuprofen (ADVIL,MOTRIN) tablet 600 mg (not administered)  zolpidem (AMBIEN) tablet 10 mg (not administered)  nicotine (NICODERM CQ - dosed in mg/24 hours) patch 21 mg (not administered)  ondansetron (ZOFRAN) tablet 4 mg (not administered)  alum & mag hydroxide-simeth (MAALOX/MYLANTA) 200-200-20 MG/5ML suspension 30 mL (not administered)  hydrOXYzine (ATARAX/VISTARIL) tablet 25 mg (not administered)  traZODone (DESYREL) tablet 50 mg (not administered)  divalproex (DEPAKOTE ER) 24 hr tablet 250 mg (250 mg Oral Given 12/06/16 0154)  QUEtiapine (SEROQUEL) tablet 100 mg (100 mg Oral Given 12/06/16 0154)  QUEtiapine (SEROQUEL) tablet 50 mg (not administered)  sodium chloride 0.9 % bolus 1,000 mL (0 mLs Intravenous Stopped 12/06/16 0100)  sodium chloride 0.9 % bolus 1,000 mL (1,000 mLs Intravenous New Bag/Given 12/06/16 0156)     Initial Impression / Assessment and Plan / ED Course  I have reviewed the triage vital signs and the nursing notes.  Pertinent labs & imaging results that were available during my care of the patient were reviewed by me and considered in my medical decision making (see chart for details).  Pt placed on a monitor. He was given IV fluids.   Recheck at 01:00 AM pt is alert and awake, eyes no longer  blood shot.  States he isn't feeling physically ill. Still need a UDS. States he is still feeling that he may hurt himself. Is agreeable to TTS consult.   02:00 AM pt given IV fluids to help facilitate getting a urine sample. Psych holding orders and TTS consult ordered.   04:40 AM waiting for TTS consult  Final Clinical Impressions(s) / ED Diagnoses   Final diagnoses:  Severe episode of recurrent major depressive disorder, without psychotic features (HCC)  Intentional drug overdose, initial encounter (HCC)  Suicidal ideation    Disposition pending TTS evaluation, anticipated inpatient psychiatric admission  Devoria Albe, MD, Concha Pyo, MD 12/06/16 [id number]    Devoria Albe, MD 12/06/16 [phone]

## 2016-12-06 LAB — COMPREHENSIVE METABOLIC PANEL
ALT: 14 U/L — ABNORMAL LOW (ref 17–63)
ANION GAP: 7 (ref 5–15)
AST: 17 U/L (ref 15–41)
Albumin: 4.2 g/dL (ref 3.5–5.0)
Alkaline Phosphatase: 55 U/L (ref 38–126)
BILIRUBIN TOTAL: 0.5 mg/dL (ref 0.3–1.2)
BUN: 9 mg/dL (ref 6–20)
CO2: 24 mmol/L (ref 22–32)
Calcium: 9.2 mg/dL (ref 8.9–10.3)
Chloride: 106 mmol/L (ref 101–111)
Creatinine, Ser: 0.85 mg/dL (ref 0.61–1.24)
GFR calc Af Amer: >60 mL/min (ref >60)
Glucose, Bld: 97 mg/dL (ref 65–99)
POTASSIUM: 3.9 mmol/L (ref 3.5–5.1)
Sodium: 137 mmol/L (ref 135–145)
TOTAL PROTEIN: 7 g/dL (ref 6.5–8.1)

## 2016-12-06 LAB — ACETAMINOPHEN LEVEL

## 2016-12-06 LAB — ETHANOL

## 2016-12-06 LAB — RAPID URINE DRUG SCREEN, HOSP PERFORMED
Amphetamines: NOT DETECTED
Barbiturates: NOT DETECTED
Benzodiazepines: NOT DETECTED
Cocaine: POSITIVE — AB
OPIATES: NOT DETECTED
Tetrahydrocannabinol: NOT DETECTED

## 2016-12-06 LAB — SALICYLATE LEVEL

## 2016-12-06 LAB — VALPROIC ACID LEVEL: Valproic Acid Lvl: <10 ug/mL — ABNORMAL LOW (ref 50.0–100.0)

## 2016-12-06 MED ORDER — ALUM & MAG HYDROXIDE-SIMETH 200-200-20 MG/5ML PO SUSP: 30.0000 mL | ORAL | Status: DC | PRN

## 2016-12-06 MED ORDER — TRAZODONE HCL 50 MG PO TABS
50.0000 mg | ORAL_TABLET | Freq: Every evening | ORAL | Status: DC | PRN
  Administered 2016-12-06: 50 mg via ORAL
  Filled 2016-12-06: qty 1

## 2016-12-06 MED ORDER — IBUPROFEN 200 MG PO TABS: 600.0000 mg | ORAL_TABLET | Freq: Three times a day (TID) | ORAL | Status: DC | PRN

## 2016-12-06 MED ORDER — ZOLPIDEM TARTRATE 10 MG PO TABS: 10.0000 mg | ORAL_TABLET | Freq: Every evening | ORAL | Status: DC | PRN

## 2016-12-06 MED ORDER — DIVALPROEX SODIUM ER 250 MG PO TB24
250.0000 mg | ORAL_TABLET | Freq: Two times a day (BID) | ORAL | Status: DC
  Administered 2016-12-06 – 2016-12-07 (×4): 250 mg via ORAL
  Filled 2016-12-06 (×4): qty 1

## 2016-12-06 MED ORDER — SODIUM CHLORIDE 0.9 % IV BOLUS (SEPSIS)
1000.0000 mL | Freq: Once | INTRAVENOUS | Status: AC
  Administered 2016-12-06: 1000 mL via INTRAVENOUS

## 2016-12-06 MED ORDER — ACETAMINOPHEN 325 MG PO TABS: 650.0000 mg | ORAL_TABLET | ORAL | Status: DC | PRN

## 2016-12-06 MED ORDER — NICOTINE 21 MG/24HR TD PT24
21.0000 mg | MEDICATED_PATCH | Freq: Every day | TRANSDERMAL | Status: DC
  Filled 2016-12-06: qty 1

## 2016-12-06 MED ORDER — ONDANSETRON HCL 4 MG PO TABS: 4.0000 mg | ORAL_TABLET | Freq: Three times a day (TID) | ORAL | Status: DC | PRN

## 2016-12-06 MED ORDER — HYDROXYZINE HCL 25 MG PO TABS: 25.0000 mg | ORAL_TABLET | Freq: Three times a day (TID) | ORAL | Status: DC | PRN

## 2016-12-06 MED ORDER — QUETIAPINE FUMARATE 100 MG PO TABS
100.0000 mg | ORAL_TABLET | Freq: Every day | ORAL | Status: DC
  Administered 2016-12-06 (×2): 100 mg via ORAL
  Filled 2016-12-06 (×2): qty 1

## 2016-12-06 MED ORDER — QUETIAPINE FUMARATE 50 MG PO TABS: 50.0000 mg | ORAL_TABLET | Freq: Four times a day (QID) | ORAL | Status: DC | PRN

## 2016-12-06 NOTE — ED Provider Notes (Signed)
06:44 AM Berna Spare, TTS, states meets inpatient criteria but they do not have a bed, will work on placement.   Devoria Albe, MD, Concha Pyo, MD 12/06/16 [phone]

## 2016-12-06 NOTE — ED Notes (Signed)
Poison control called to follow up. They are closing out the case on their end

## 2016-12-06 NOTE — ED Notes (Signed)
Pt resting at present, no distress noted, calm & cooperative.  Monitoring for safety, Q 15 min checks in effect. 

## 2016-12-06 NOTE — ED Notes (Signed)
Bed: ZO10 Expected date:  Expected time:  Means of arrival:  Comments: Hold for RES

## 2016-12-06 NOTE — ED Notes (Signed)
Patient came back to the unit ambulatory.  Has been in his room most of the shift.  He laid down and went to sleep shortly after arriving on the unit and has slept most of the day.  Ryan Bartlett has a saline lock in place when he arrived in the [facility] which was removed without incident.  He does still admit to some suicidal ideation.

## 2016-12-06 NOTE — BH Assessment (Signed)
Tele Assessment Note   Ryan Bartlett is an 21 y.o. male.  -Clinician reviewed note by Dr. Devoria Albe.  This patient has a history of bipolar and schizoaffective disorder. He reports about 30 minutes prior to coming to the emergency room his roommate witnessed him taking about 10 green and white oval capsules that he is unaware what they were plus he did cocaine. He states "I tried to kill myself". When asked why he tried to kill himself he states he has "a lot going on". He states "I don't know how to handle it". He states his great grandmother recently died in his father doesn't think that the patient is his son. His roommate called EMS after witnessing him take the pills. Patient states he still feels suicidal. Patient was last admitted to a psychiatric facility in March for suicidal ideation.  Patient was very drowsy during assessment.  He had to be prompted several times to stay awake.  Pt does still endorse suicidality.  When asked why he took these unidentified pills, he states, "It was intent to kill myself."  Patient has had previous suicide attempt.  Patient denies any HI or AV hallucinations.  Patient reports that he did not know what the pills were.  He is unable to state clearly why he feels suicidal.  He did say that his father told him last week he was not sure that Ryan Bartlett was his son.  Patient's great grandmother died recently also.    Patient says he smokes about a blunt per day of marijuana.  He has none on his UDS and says it has been awhile since he had any.  Patient was positive for cocaine but is unclear about his frequency of usage.  Patient was at [facility] in 10/2016; 07/2016 and previously.  He has ACTT team services from [facility].  He did not attempt to call his ACTT team for this crisis.  -Clinician discussed patient care with Donell Sievert, PA who recommends inpatient care.  Clinician called Dr. Lynelle Doctor and updated her on patient being in need of inpatient care.   TTS to seek placement as there are no appropriate beds at [facility].  Diagnosis: Bi-polar d/o; ADHD; cocaine use d/o moderate  Past Medical History:  Past Medical History:  Diagnosis Date  . ADHD (attention deficit hyperactivity disorder)   . Arthritis   . Asthma   . Bipolar disorder (HCC)   . Depression   . Oppositional defiant disorder   . Unspecified episodic mood disorder     Past Surgical History:  Procedure Laterality Date  . NO PAST SURGERIES      Family History:  Family History  Problem Relation Age of Onset  . Depression Mother   . Diabetes Other   . Hyperlipidemia Other   . Hypertension Other     Social History:  reports that he has been smoking Cigarettes.  He has been smoking about 0.50 packs per day for the past 0.00 years. He has never used smokeless tobacco. He reports that he uses drugs, including Marijuana and Cocaine. He reports that he does not drink alcohol.  Additional Social History:  Alcohol / Drug Use Pain Medications: None Prescriptions: Seroquel, Depakote and two more he can't remember Over the Counter: None History of alcohol / drug use?: Yes Substance #1 Name of Substance 1: Cocaine 1 - Age of First Use: Teens 1 - Amount (size/oz): Varies 1 - Frequency: Uses more than once per week. 1 - Duration: off and on  1 - Last Use / Amount: 04/18 Substance #2 Name of Substance 2: Marijuana 2 - Age of First Use: 21 years of age 82 - Amount (size/oz): A blunt per day 2 - Frequency: Daily use 2 - Duration: on-going 2 - Last Use / Amount: Can't recall last use.  Not on UDS  CIWA: CIWA-Ar BP: 127/74 Pulse Rate: 72 COWS:    PATIENT STRENGTHS: (choose at least two) Average or above average intelligence Communication skills Physical Health  Allergies:  Allergies  Allergen Reactions  . Peanut-Containing Drug Products Anaphylaxis  . Lactose Intolerance (Gi) Diarrhea and Nausea And Vomiting    Home Medications:  (Not in a hospital  admission)  OB/GYN Status:  No LMP for male patient.  General Assessment Data Location of Assessment: [facility] ED TTS Assessment: In system Is this a Tele or Face-to-Face Assessment?: Tele Assessment Is this an Initial Assessment or a Re-assessment for this encounter?: Initial Assessment Marital status: Single Is patient pregnant?: No Pregnancy Status: No Living Arrangements: Non-relatives/Friends (Pt is paying rent with some friends.) Can pt return to current living arrangement?: Yes Admission Status: Voluntary Is patient capable of signing voluntary admission?: Yes Referral Source: Self/Family/Friend (A roommate called EMS.) Insurance type: MCD     Crisis Care Plan Living Arrangements: Non-relatives/Friends (Pt is paying rent with some friends.) Name of Psychiatrist: [facility] Name of Therapist: [facility]  Education Status Is patient currently in school?: No Highest grade of school patient has completed: 11th grade  Risk to self with the past 6 months Suicidal Ideation: Yes-Currently Present Has patient been a risk to self within the past 6 months prior to admission? : Yes Suicidal Intent: Yes-Currently Present Has patient had any suicidal intent within the past 6 months prior to admission? : Yes Is patient at risk for suicide?: Yes Suicidal Plan?: Yes-Currently Present Has patient had any suicidal plan within the past 6 months prior to admission? : No Specify Current Suicidal Plan: Overdose on pills Access to Means: Yes Specify Access to Suicidal Means: medications What has been your use of drugs/alcohol within the last 12 months?: Cocaine Previous Attempts/Gestures: Yes How many times?: 1 Other Self Harm Risks: Cutting Triggers for Past Attempts: Unpredictable Intentional Self Injurious Behavior: Cutting Comment - Self Injurious Behavior: Was cutting about 3 months ago Family Suicide History: No Recent stressful life event(s): Conflict  (Comment), Loss (Comment) (Father says [name] may not be his; great grandmother died) Persecutory voices/beliefs?: No Depression: Yes Depression Symptoms: Despondent, Insomnia, Isolating, Guilt, Loss of interest in usual pleasures, Feeling worthless/self pity Substance abuse history and/or treatment for substance abuse?: Yes Suicide prevention information given to non-admitted patients: Not applicable  Risk to Others within the past 6 months Homicidal Ideation: No Does patient have any lifetime risk of violence toward others beyond the six months prior to admission? : No Thoughts of Harm to Others: No Current Homicidal Intent: No Current Homicidal Plan: No Access to Homicidal Means: No Identified Victim: No one History of harm to others?: Yes Assessment of Violence: In distant past Violent Behavior Description: "It has been a very long while" Does patient have access to weapons?: No Criminal Charges Pending?: No Does patient have a court date: No Is patient on probation?: No  Psychosis Hallucinations: None noted Delusions: None noted  Mental Status Report Appearance/Hygiene: Poor hygiene, In scrubs Eye Contact: Poor Motor Activity: Freedom of movement, Psychomotor retardation Speech: Logical/coherent, Soft Level of Consciousness: Drowsy Mood: Depressed, Despair, Helpless, Sad Affect: Depressed Anxiety Level: None Panic  attack frequency: None Most recent panic attack: None Thought Processes: Coherent, Relevant Judgement: Impaired Orientation: Person, Place, Time, Situation Obsessive Compulsive Thoughts/Behaviors: None  Cognitive Functioning Concentration: Decreased Memory: Recent Impaired, Remote Intact IQ: Average Insight: Good Impulse Control: Poor Appetite: Good Weight Loss: 0 Weight Gain: 0 Sleep: Decreased Total Hours of Sleep:  (<4H/D) Vegetative Symptoms: None  ADLScreening [facility] Assessment Services) Patient's cognitive ability adequate to safely complete  daily activities?: Yes Patient able to express need for assistance with ADLs?: Yes Independently performs ADLs?: Yes (appropriate for developmental age)  Prior Inpatient Therapy Prior Inpatient Therapy: Yes Prior Therapy Dates: 10/2016; 07/2016, 02/2016 Prior Therapy Facilty/Provider(s): [facility] Reason for Treatment: SI, Bipolar  Prior Outpatient Therapy Prior Outpatient Therapy: Yes Prior Therapy Dates: 2017, and current Prior Therapy Facilty/Provider(s): [facility] Reason for Treatment: Bipolar Does patient have an ACCT team?: Yes ([facility]) Does patient have Intensive In-House Services?  : No Does patient have [facility] services? : No Does patient have P4CC services?: No  ADL Screening (condition at time of admission) Patient's cognitive ability adequate to safely complete daily activities?: Yes Is the patient deaf or have difficulty hearing?: No Does the patient have difficulty seeing, even when wearing glasses/contacts?: No Does the patient have difficulty concentrating, remembering, or making decisions?: Yes Patient able to express need for assistance with ADLs?: Yes Does the patient have difficulty dressing or bathing?: No Independently performs ADLs?: Yes (appropriate for developmental age) Does the patient have difficulty walking or climbing stairs?: No Weakness of Legs: None Weakness of Arms/Hands: None       Abuse/Neglect Assessment (Assessment to be complete while patient is alone) Physical Abuse: Denies Verbal Abuse: Denies Sexual Abuse: Denies Exploitation of patient/patient's resources: Denies Self-Neglect: Denies     Merchant navy officer (For Healthcare) Does Patient Have a Medical Advance Directive?: No    Additional Information 1:1 In Past 12 Months?: No CIRT Risk: No Elopement Risk: No Does patient have medical clearance?: Yes     Disposition:  Disposition Initial Assessment Completed for this Encounter:  Yes Disposition of Patient: Inpatient treatment program, Referred to Type of inpatient treatment program: Adult Patient referred to: Other (Comment) (Pt to be reviewed with PA)  Beatriz Stallion Ray 12/06/2016 6:23 AM

## 2016-12-06 NOTE — Progress Notes (Addendum)
12/06/16 1357:  LRT went to pt room to offer activities, pt was sleep.   Caroll Rancher, LRT/CTRS

## 2016-12-06 NOTE — ED Notes (Signed)
Bed: [facility] Expected date:  Expected time:  Means of arrival:  Comments: ED room 19

## 2016-12-07 ENCOUNTER — Inpatient Hospital Stay (HOSPITAL_COMMUNITY): Admission: AD | Admit: 2016-12-07 | Payer: Medicaid Other | Source: Intra-hospital | Admitting: Psychiatry

## 2016-12-07 DIAGNOSIS — F1490 Cocaine use, unspecified, uncomplicated: Secondary | ICD-10-CM | POA: Diagnosis not present

## 2016-12-07 DIAGNOSIS — Z818 Family history of other mental and behavioral disorders: Secondary | ICD-10-CM

## 2016-12-07 DIAGNOSIS — F1290 Cannabis use, unspecified, uncomplicated: Secondary | ICD-10-CM | POA: Diagnosis not present

## 2016-12-07 DIAGNOSIS — F17210 Nicotine dependence, cigarettes, uncomplicated: Secondary | ICD-10-CM | POA: Diagnosis not present

## 2016-12-07 DIAGNOSIS — F3130 Bipolar disorder, current episode depressed, mild or moderate severity, unspecified: Secondary | ICD-10-CM

## 2016-12-07 NOTE — Discharge Instructions (Signed)
For your ongoing behavioral health needs, you are advised to continue treatment with the [facility] ACT Team: ° °     [facility] °     [address] °     [place], [place] [address] °     [phone] °

## 2016-12-07 NOTE — ED Notes (Signed)
Pt discharged to his ACT team.  All belongings returned to pt who signed for same. Denies SI/HI, AVH. Did not appear to be responding to internal stimuli.

## 2016-12-07 NOTE — BH Assessment (Signed)
Per Judeth Cornfield with the [employer]) they will be coming to pick up the patient at 12:55pm

## 2016-12-07 NOTE — BH Assessment (Signed)
Writer spoke to the patient ACTT Worker Judeth Cornfield) in order to coordinate a time that they will be able to pick up the patient. The ACTT Worker reports that she will call back with a scheduled time.

## 2016-12-07 NOTE — [facility] Suicide Risk Assessment (Signed)
Suicide Risk Assessment  Discharge Assessment   [facility] Discharge Suicide Risk Assessment   Principal Problem: Bipolar I disorder, most recent episode depressed [facility]) Discharge Diagnoses:  Patient Active Problem List   Diagnosis Date Noted  . Bipolar I disorder, most recent episode depressed (HCC) [F31.30] 05/23/2013    Priority: High  . Cannabis abuse [F12.10] 03/30/2012    Priority: Medium  . ADHD (attention deficit hyperactivity disorder), combined type [F90.2] 08/28/2011    Priority: Medium  . 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 Suicidal thoughts [R45.851] 11/07/2016  . Bipolar 2 disorder (HCC) [F31.81] 08/08/2016  . Bipolar affect, depressed (HCC) [F31.30] 09/18/2015  . Bipolar affective disorder, currently depressed, mild (HCC) [F31.31] 09/17/2015  . Intentional overdose of drug in tablet form (HCC) [T50.902A]   . Depression [F32.9]   . Suicidal ideation [R45.851]   . Cannabis use disorder, moderate, dependence (HCC) [F12.20] 06/06/2015  . Alcohol use disorder, moderate, dependence (HCC) [F10.20] 06/06/2015  . Conduct disorder, adolescent onset type [F91.2] 08/28/2011    Total Time spent with patient: 45 minutes  Musculoskeletal: Strength & Muscle Tone: within normal limits Gait & Station: normal Patient leans: N/A  Psychiatric Specialty Exam: Physical Exam  Constitutional: He is oriented to person, place, and time. He appears well-developed and well-nourished.  HENT:  Head: Normocephalic.  Neck: Normal range of motion.  Respiratory: Effort normal.  Musculoskeletal: Normal range of motion.  Neurological: He is alert and oriented to person, place, and time.  Psychiatric: His speech is normal and behavior is normal. Judgment and thought content normal. Cognition and memory are normal. He exhibits a depressed mood.    Review of Systems  Psychiatric/Behavioral: Positive for depression.  All other systems reviewed and are negative.   Blood  pressure 101/61, pulse (!) 54, temperature 98.1 F (36.7 C), temperature source Oral, resp. rate 18, SpO2 97 %.There is no height or weight on file to calculate BMI.  General Appearance: Casual  Eye Contact:  Good  Speech:  Normal Rate  Volume:  Normal  Mood:  Depressed  Affect:  Congruent  Thought Process:  Coherent and Descriptions of Associations: Intact  Orientation:  Full (Time, Place, and Person)  Thought Content:  WDL and Logical  Suicidal Thoughts:  No  Homicidal Thoughts:  No  Memory:  Immediate;   Good Recent;   Good Remote;   Good  Judgement:  Fair  Insight:  Fair  Psychomotor Activity:  Normal  Concentration:  Concentration: Good and Attention Span: Good  Recall:  Good  Fund of Knowledge:  Fair  Language:  Good  Akathisia:  No  Handed:  Right  AIMS (if indicated):     Assets:  Housing Leisure Time Physical Health Resilience Social Support  ADL's:  Intact  Cognition:  WNL  Sleep:       Mental Status Per Nursing Assessment::   On Admission:   depression with suicidal ideatiosn  Demographic Factors:  Male and Adolescent or young adult  Loss Factors: NA  Historical Factors: NA  Risk Reduction Factors:   Sense of responsibility to family, Positive social support and Positive therapeutic relationship  Continued Clinical Symptoms:  Depression, mild  Cognitive Features That Contribute To Risk:  None    Suicide Risk:  Minimal: No identifiable suicidal ideation.  Patients presenting with no risk factors but with morbid ruminations; may be classified as minimal risk based on the severity of the depressive symptoms    Plan Of Care/Follow-up recommendations:  Activity:  as tolerated Diet:  heart healthy diet  Sheronda Parran, NP 12/07/2016, 12:33 PM

## 2016-12-07 NOTE — Consult Note (Signed)
[facility] Consult   Reason for Consult:  Depression  Referring Physician:  EDP Patient Identification: Ryan Bartlett MRN:  [id number] Principal Diagnosis: Bipolar I disorder, most recent episode depressed [facility]) Diagnosis:   Patient Active Problem List   Diagnosis Date Noted  . Bipolar I disorder, most recent episode depressed ([place]) [F31.30] 05/23/2013    Priority: High  . Cannabis abuse [F12.10] 03/30/2012    Priority: Medium  . ADHD (attention deficit hyperactivity disorder), combined type [F90.2] 08/28/2011    Priority: Medium  . 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 Suicidal thoughts [R45.851] 11/07/2016  . Bipolar 2 disorder ([place]) [F31.81] 08/08/2016  . Bipolar affect, depressed ([place]) [F31.30] 09/18/2015  . Bipolar affective disorder, currently depressed, mild ([place]) [F31.31] 09/17/2015  . Intentional overdose of drug in tablet form ([place]) [T50.902A]   . Depression [F32.9]   . Suicidal ideation [R45.851]   . Cannabis use disorder, moderate, dependence ([place]) [F12.20] 06/06/2015  . Alcohol use disorder, moderate, dependence ([place]) [F10.20] 06/06/2015  . Conduct disorder, adolescent onset type [F91.2] 08/28/2011    Total Time spent with patient: 45 minutes  Subjective:   Ryan Bartlett is a 21 y.o. male patient does not warrant admission.  HPI:  21 yo male who presented to the ED with depression.  He is well known to this ED and providers, did not call his ACT team who reported he had an appointment this am.  They are coming to get him as he denies suicidal/homicidal ideations, hallucinations, or substance abuse.  Stable for discharge.  Past Psychiatric History: schizoaffective disorder  Risk to Self: None Risk to Others: None Prior Inpatient Therapy: Prior Inpatient Therapy: Yes Prior Therapy Dates: 10/2016; 07/2016, 02/2016 Prior Therapy Facilty/Provider(s): [facility] Reason for Treatment: SI, Bipolar Prior Outpatient Therapy: Prior  Outpatient Therapy: Yes Prior Therapy Dates: 2017, and current Prior Therapy Facilty/Provider(s): [facility], [facility] Reason for Treatment: Bipolar Does patient have an ACCT team?: Yes ([facility]) Does patient have Intensive In-House Services?  : No Does patient have [facility] services? : No Does patient have [facility] services?: No  Past Medical History:  Past Medical History:  Diagnosis Date  . ADHD (attention deficit hyperactivity disorder)   . Arthritis   . Asthma   . Bipolar disorder ([place])   . Depression   . Oppositional defiant disorder   . Unspecified episodic mood disorder     Past Surgical History:  Procedure Laterality Date  . NO PAST SURGERIES     Family History:  Family History  Problem Relation Age of Onset  . Depression Mother   . Diabetes Other   . Hyperlipidemia Other   . Hypertension Other    Family Psychiatric  History: unknown Social History:  History  Alcohol Use No     History  Drug Use  . Types: Marijuana, Cocaine    Social History   Social History  . Marital status: Single    Spouse name: N/A  . Number of children: N/A  . Years of education: N/A   Occupational History  . student Minor    12th grade at [employer] Topics  . Smoking status: Current Every Day Smoker    Packs/day: 0.50    Years: 0.00    Types: Cigarettes  . Smokeless tobacco: Never Used  . Alcohol use No  . Drug use: Yes    Types: Marijuana, Cocaine  . Sexual activity: Not Currently    Partners:  Female     Comment: Pt reports that he is not sexually active   Other Topics Concern  . None   Social History Narrative  . None   Additional Social History:    Allergies:   Allergies  Allergen Reactions  . Peanut-Containing Drug Products Anaphylaxis  . Lactose Intolerance (Gi) Diarrhea and Nausea And Vomiting    Labs:  Results for orders placed or performed during the hospital encounter of 12/05/16 (from the past 48  hour(s))  Comprehensive metabolic panel     Status: Abnormal   Collection Time: 12/05/16 11:12 PM  Result Value Ref Range   Sodium 137 135 - 145 mmol/L   Potassium 3.9 3.5 - 5.1 mmol/L   Chloride 106 101 - 111 mmol/L   CO2 24 22 - 32 mmol/L   Glucose, Bld 97 65 - 99 mg/dL   BUN 9 6 - 20 mg/dL   Creatinine, Ser 6.81 0.61 - 1.24 mg/dL   Calcium 9.2 8.9 - 15.7 mg/dL   Total Protein 7.0 6.5 - 8.1 g/dL   Albumin 4.2 3.5 - 5.0 g/dL   AST 17 15 - 41 U/L   ALT 14 (L) 17 - 63 U/L   Alkaline Phosphatase 55 38 - 126 U/L   Total Bilirubin 0.5 0.3 - 1.2 mg/dL   GFR calc non Af Amer >60 >60 mL/min   GFR calc Af Amer >60 >60 mL/min    Comment: (NOTE) The eGFR has been calculated using the CKD EPI equation. This calculation has not been validated in all clinical situations. eGFR's persistently <60 mL/min signify possible Chronic Kidney Disease.    Anion gap 7 5 - 15  Ethanol     Status: None   Collection Time: 12/05/16 11:12 PM  Result Value Ref Range   Alcohol, Ethyl (B) <5 <5 mg/dL    Comment:        LOWEST DETECTABLE LIMIT FOR SERUM ALCOHOL IS 5 mg/dL FOR MEDICAL PURPOSES ONLY   Salicylate level     Status: None   Collection Time: 12/05/16 11:12 PM  Result Value Ref Range   Salicylate Lvl <7.0 2.8 - 30.0 mg/dL  Acetaminophen level     Status: Abnormal   Collection Time: 12/05/16 11:12 PM  Result Value Ref Range   Acetaminophen (Tylenol), Serum <10 (L) 10 - 30 ug/mL    Comment:        THERAPEUTIC CONCENTRATIONS VARY SIGNIFICANTLY. A RANGE OF 10-30 ug/mL MAY BE AN EFFECTIVE CONCENTRATION FOR MANY PATIENTS. HOWEVER, SOME ARE BEST TREATED AT CONCENTRATIONS OUTSIDE THIS RANGE. ACETAMINOPHEN CONCENTRATIONS >150 ug/mL AT 4 HOURS AFTER INGESTION AND >50 ug/mL AT 12 HOURS AFTER INGESTION ARE OFTEN ASSOCIATED WITH TOXIC REACTIONS.   cbc     Status: None   Collection Time: 12/05/16 11:12 PM  Result Value Ref Range   WBC 9.2 4.0 - 10.5 K/uL   RBC 4.83 4.22 - 5.81 MIL/uL    Hemoglobin 13.6 13.0 - 17.0 g/dL   HCT 26.2 03.5 - 59.7 %   MCV 81.2 78.0 - 100.0 fL   MCH 28.2 26.0 - 34.0 pg   MCHC 34.7 30.0 - 36.0 g/dL   RDW 41.6 38.4 - 53.6 %   Platelets 201 150 - 400 K/uL  CBG monitoring, ED     Status: None   Collection Time: 12/05/16 11:15 PM  Result Value Ref Range   Glucose-Capillary 83 65 - 99 mg/dL  Valproic acid level     Status: Abnormal   Collection Time:  12/05/16 11:42 PM  Result Value Ref Range   Valproic Acid Lvl <10 (L) 50.0 - 100.0 ug/mL    Comment: RESULTS CONFIRMED BY MANUAL DILUTION  Rapid urine drug screen (hospital performed)     Status: Abnormal   Collection Time: 12/06/16  1:03 AM  Result Value Ref Range   Opiates NONE DETECTED NONE DETECTED   Cocaine POSITIVE (A) NONE DETECTED   Benzodiazepines NONE DETECTED NONE DETECTED   Amphetamines NONE DETECTED NONE DETECTED   Tetrahydrocannabinol NONE DETECTED NONE DETECTED   Barbiturates NONE DETECTED NONE DETECTED    Comment:        DRUG SCREEN FOR MEDICAL PURPOSES ONLY.  IF CONFIRMATION IS NEEDED FOR ANY PURPOSE, NOTIFY LAB WITHIN 5 DAYS.        LOWEST DETECTABLE LIMITS FOR URINE DRUG SCREEN Drug Class       Cutoff (ng/mL) Amphetamine      1000 Barbiturate      200 Benzodiazepine   818 Tricyclics       563 Opiates          300 Cocaine          300 THC              50     Current Facility-Administered Medications  Medication Dose Route Frequency Provider Last Rate Last Dose  . 0.9 %  sodium chloride infusion   Intravenous Continuous Rolland Porter, MD   Stopped at 12/06/16 0451  . acetaminophen (TYLENOL) tablet 650 mg  650 mg Oral Q4H PRN Rolland Porter, MD      . alum & mag hydroxide-simeth (MAALOX/MYLANTA) 200-200-20 MG/5ML suspension 30 mL  30 mL Oral PRN Rolland Porter, MD      . divalproex (DEPAKOTE ER) 24 hr tablet 250 mg  250 mg Oral BID Rolland Porter, MD   250 mg at 12/07/16 0945  . hydrOXYzine (ATARAX/VISTARIL) tablet 25 mg  25 mg Oral TID PRN Rolland Porter, MD      . ibuprofen  (ADVIL,MOTRIN) tablet 600 mg  600 mg Oral Q8H PRN Rolland Porter, MD      . nicotine (NICODERM CQ - dosed in mg/24 hours) patch 21 mg  21 mg Transdermal Daily Rolland Porter, MD      . ondansetron (ZOFRAN) tablet 4 mg  4 mg Oral Q8H PRN Rolland Porter, MD      . QUEtiapine (SEROQUEL) tablet 100 mg  100 mg Oral QHS Rolland Porter, MD   100 mg at 12/06/16 2132  . QUEtiapine (SEROQUEL) tablet 50 mg  50 mg Oral Q6H PRN Rolland Porter, MD      . traZODone (DESYREL) tablet 50 mg  50 mg Oral QHS PRN Rolland Porter, MD   50 mg at 12/06/16 2132   Current Outpatient Prescriptions  Medication Sig Dispense Refill  . divalproex (DEPAKOTE ER) 250 MG 24 hr tablet Take 1 tablet (250 mg total) by mouth 2 (two) times daily. For mood stabilization 60 tablet 0  . QUEtiapine (SEROQUEL) 100 MG tablet Take 1 tablet (100 mg total) by mouth at bedtime. For mood control 30 tablet 0  . QUEtiapine (SEROQUEL) 50 MG tablet Take 1 tablet (50 mg) every 6 hours as needed: For agitation 90 tablet 0  . hydrOXYzine (ATARAX/VISTARIL) 25 MG tablet Take 1 tablet (25 mg total) by mouth 3 (three) times daily as needed for anxiety. (Patient not taking: Reported on 12/06/2016) 60 tablet 0  . nicotine polacrilex (NICORETTE) 2 MG gum Take 1 each (2 mg total)  by mouth as needed for smoking cessation. (Patient not taking: Reported on 12/06/2016) 100 tablet 0  . traZODone (DESYREL) 50 MG tablet Take 1 tablet (50 mg total) by mouth at bedtime as needed for sleep. (Patient not taking: Reported on 12/06/2016) 30 tablet 0    Musculoskeletal: Strength & Muscle Tone: within normal limits Gait & Station: normal Patient leans: N/A  Psychiatric Specialty Exam: Physical Exam  Constitutional: He is oriented to person, place, and time. He appears well-developed and well-nourished.  HENT:  Head: Normocephalic.  Neck: Normal range of motion.  Respiratory: Effort normal.  Musculoskeletal: Normal range of motion.  Neurological: He is alert and oriented to person, place, and time.   Psychiatric: His speech is normal and behavior is normal. Judgment and thought content normal. Cognition and memory are normal. He exhibits a depressed mood.    Review of Systems  Psychiatric/Behavioral: Positive for depression.  All other systems reviewed and are negative.   Blood pressure 101/61, pulse (!) 54, temperature 98.1 F (36.7 C), temperature source Oral, resp. rate 18, SpO2 97 %.There is no height or weight on file to calculate BMI.  General Appearance: Casual  Eye Contact:  Good  Speech:  Normal Rate  Volume:  Normal  Mood:  Depressed  Affect:  Congruent  Thought Process:  Coherent and Descriptions of Associations: Intact  Orientation:  Full (Time, Place, and Person)  Thought Content:  WDL and Logical  Suicidal Thoughts:  No  Homicidal Thoughts:  No  Memory:  Immediate;   Good Recent;   Good Remote;   Good  Judgement:  Fair  Insight:  Fair  Psychomotor Activity:  Normal  Concentration:  Concentration: Good and Attention Span: Good  Recall:  Good  Fund of Knowledge:  Fair  Language:  Good  Akathisia:  No  Handed:  Right  AIMS (if indicated):     Assets:  Housing Leisure Time Physical Health Resilience Social Support  ADL's:  Intact  Cognition:  WNL  Sleep:        Treatment Plan Summary: Daily contact with patient to assess and evaluate symptoms and progress in treatment, Medication management and Plan bipolar disorder, recent episode, depression mild   -Crisis stabilization -Medication management:  Started Seroquel 100 mg at bedtime for mood stabilization and 50 mg BID PRN agitation, Trazodone 50 mg at bedtime for sleep PRN, Depakote 250 mg BID for mood stabilization, and Vistaril 25 mg TID PRN anxiety. -Individual counseling   Disposition: No evidence of imminent risk to self or others at present.    Waylan Boga, NP 12/07/2016 12:27 PM  Patient seen face-to-face for psychiatric evaluation, chart reviewed and case discussed with the physician  extender and developed treatment plan. Reviewed the information documented and agree with the treatment plan. Corena Pilgrim, MD

## 2016-12-07 NOTE — ED Notes (Signed)
Introduced self to patient. Pt oriented to unit expectations.  Assessed pt for:  A) Anxiety &/or agitation: Pt has been calm and cooperative this morning, staying in bed resting. No complaints voiced.  S) Safety: Safety maintained with q-15-minute checks and hourly rounds by staff. A) ADLs: Pt able to perform ADLs independently. P) Pick-Up (room cleanliness): Pt's room clean and free of clutter.

## 2017-03-18 IMAGING — CR DG CHEST 2V
2 series · 2 of 2 positions shown · non-contrast
Comparison: None.

CLINICAL DATA: New mid left chest pain since 4114 this morning.
Cough. Smoker.

EXAM:
CHEST  2 VIEW

[w chest pa]
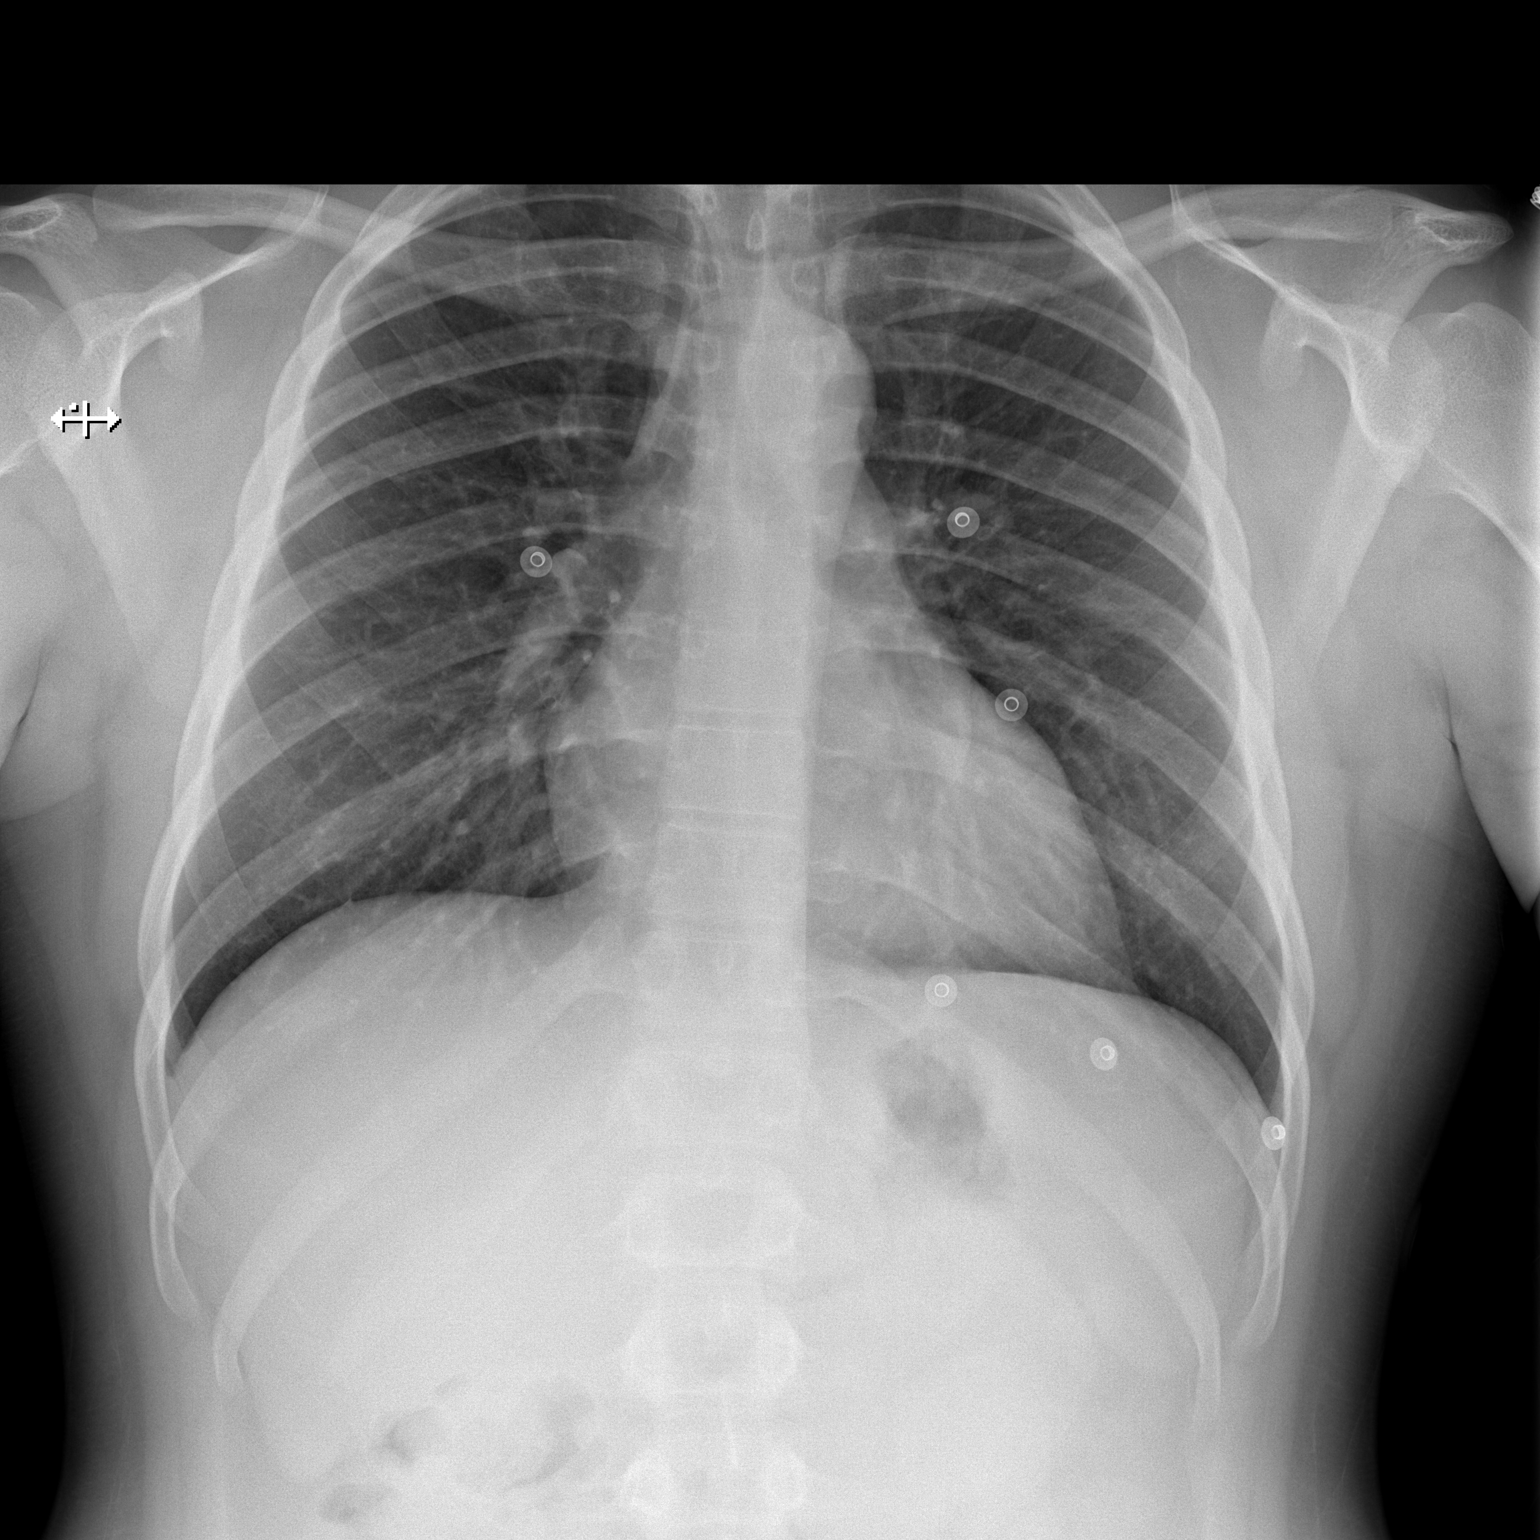

[w chest lat]
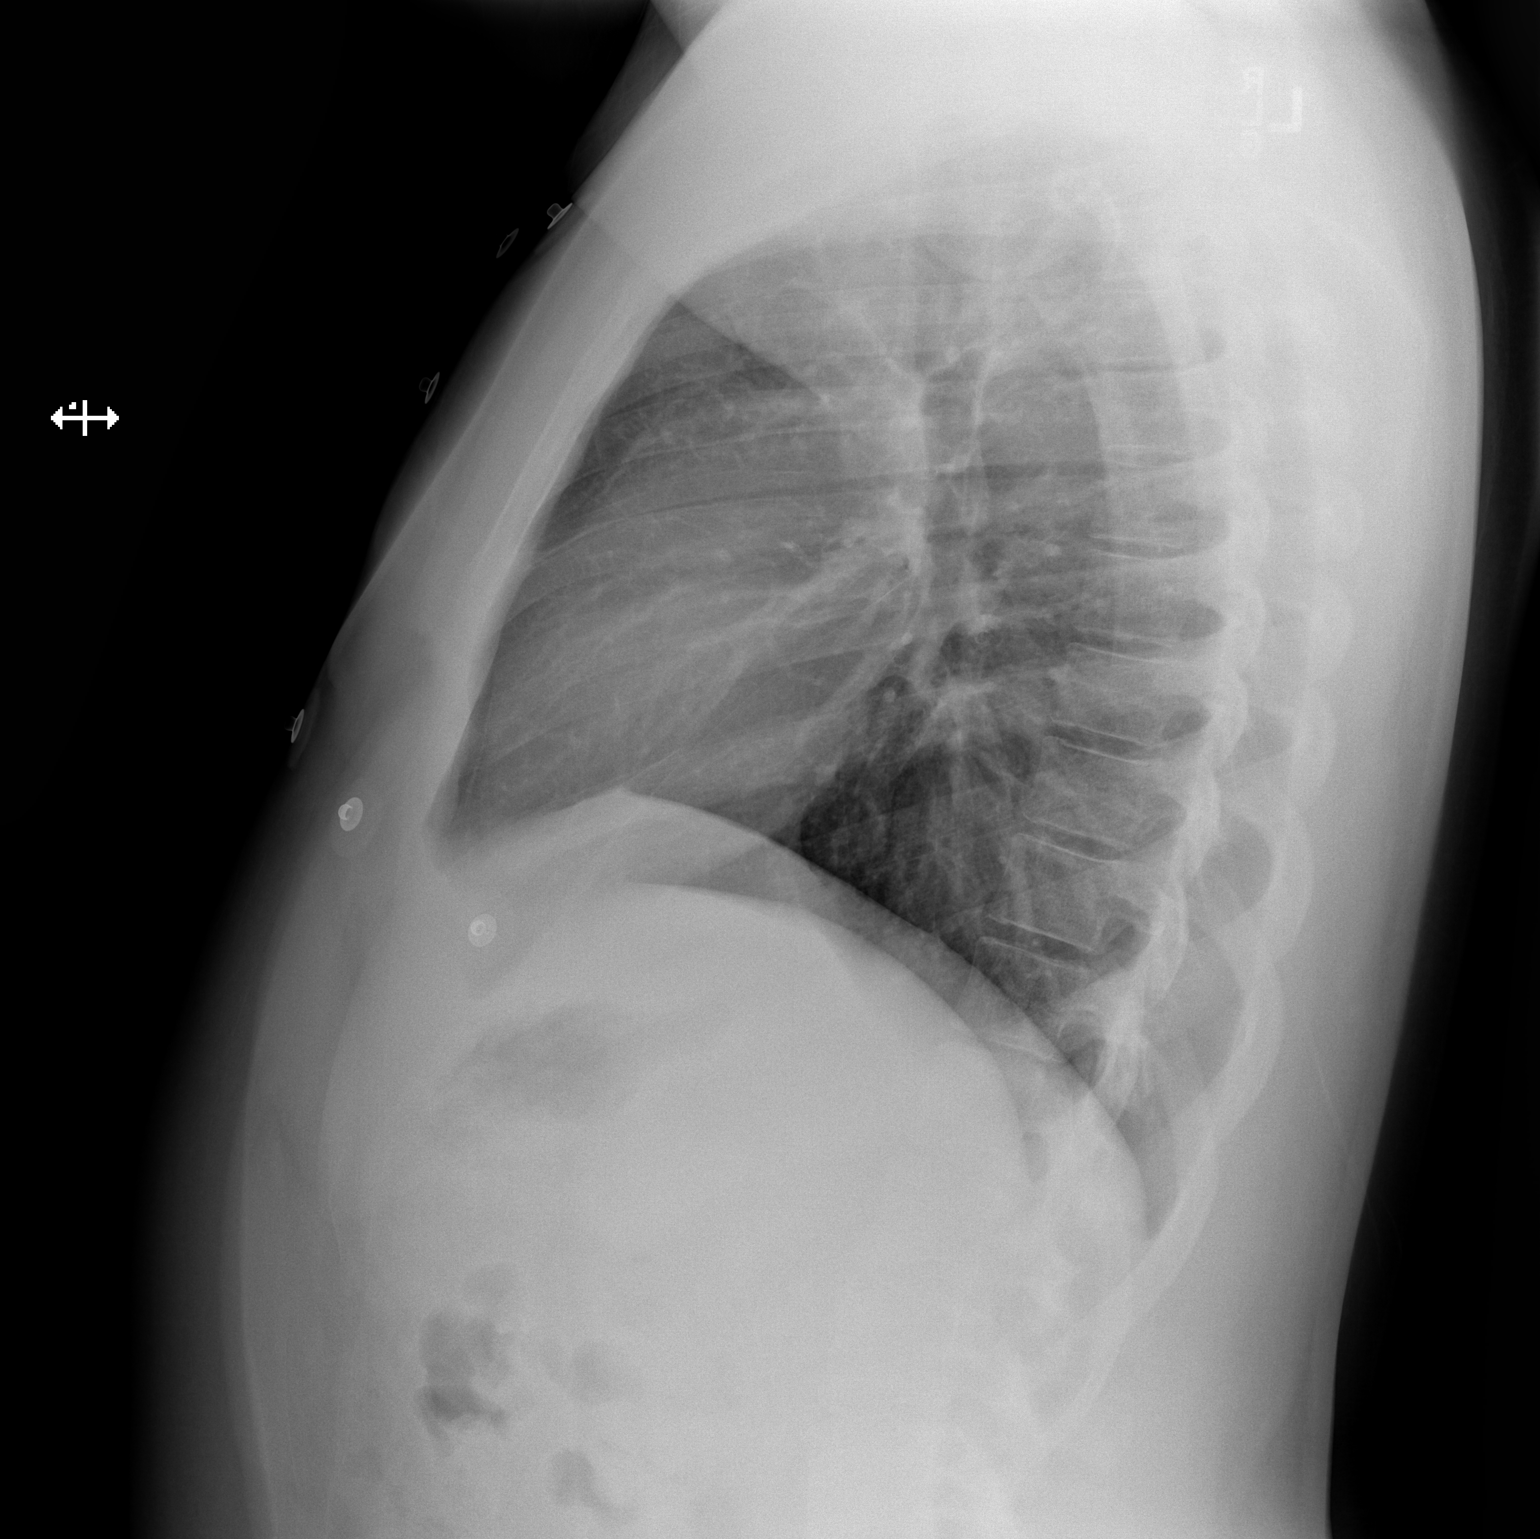

[2 of 2 positions shown; findings below may reference images not displayed]

FINDINGS: The heart size and mediastinal contours are within normal limits.
Both lungs are clear. The visualized skeletal structures are
unremarkable.
IMPRESSION: No active cardiopulmonary disease.

## 2017-11-05 IMAGING — CR DG ANKLE COMPLETE 3+V*R*
3 series · 3 of 3 positions shown · non-contrast
Comparison: None.

CLINICAL DATA: Right ankle pain

EXAM:
RIGHT ANKLE - COMPLETE 3+ VIEW

[x ankle ap right]
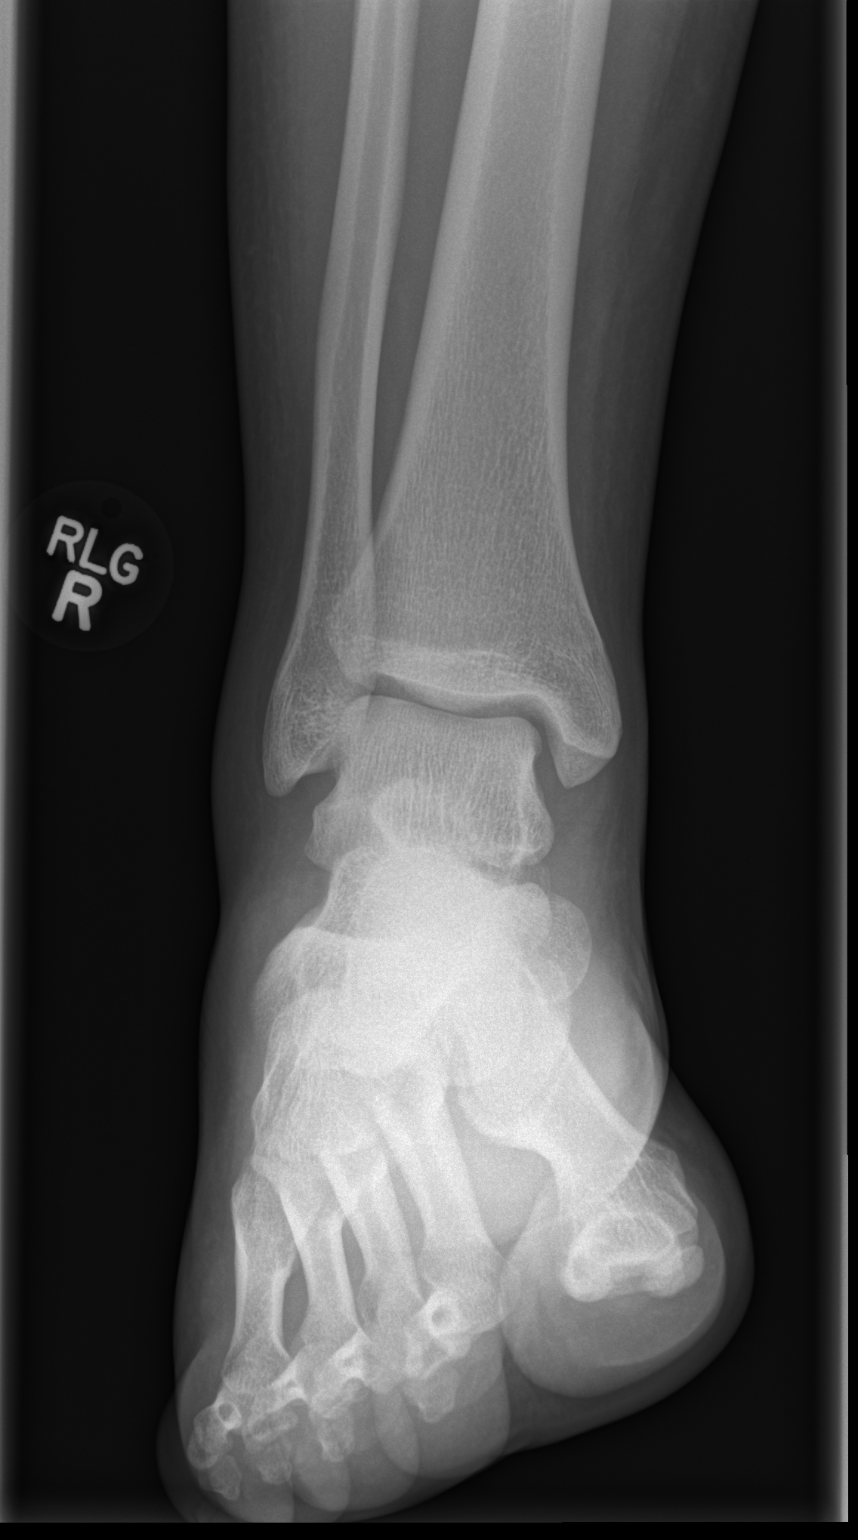

[x ankle obl right]
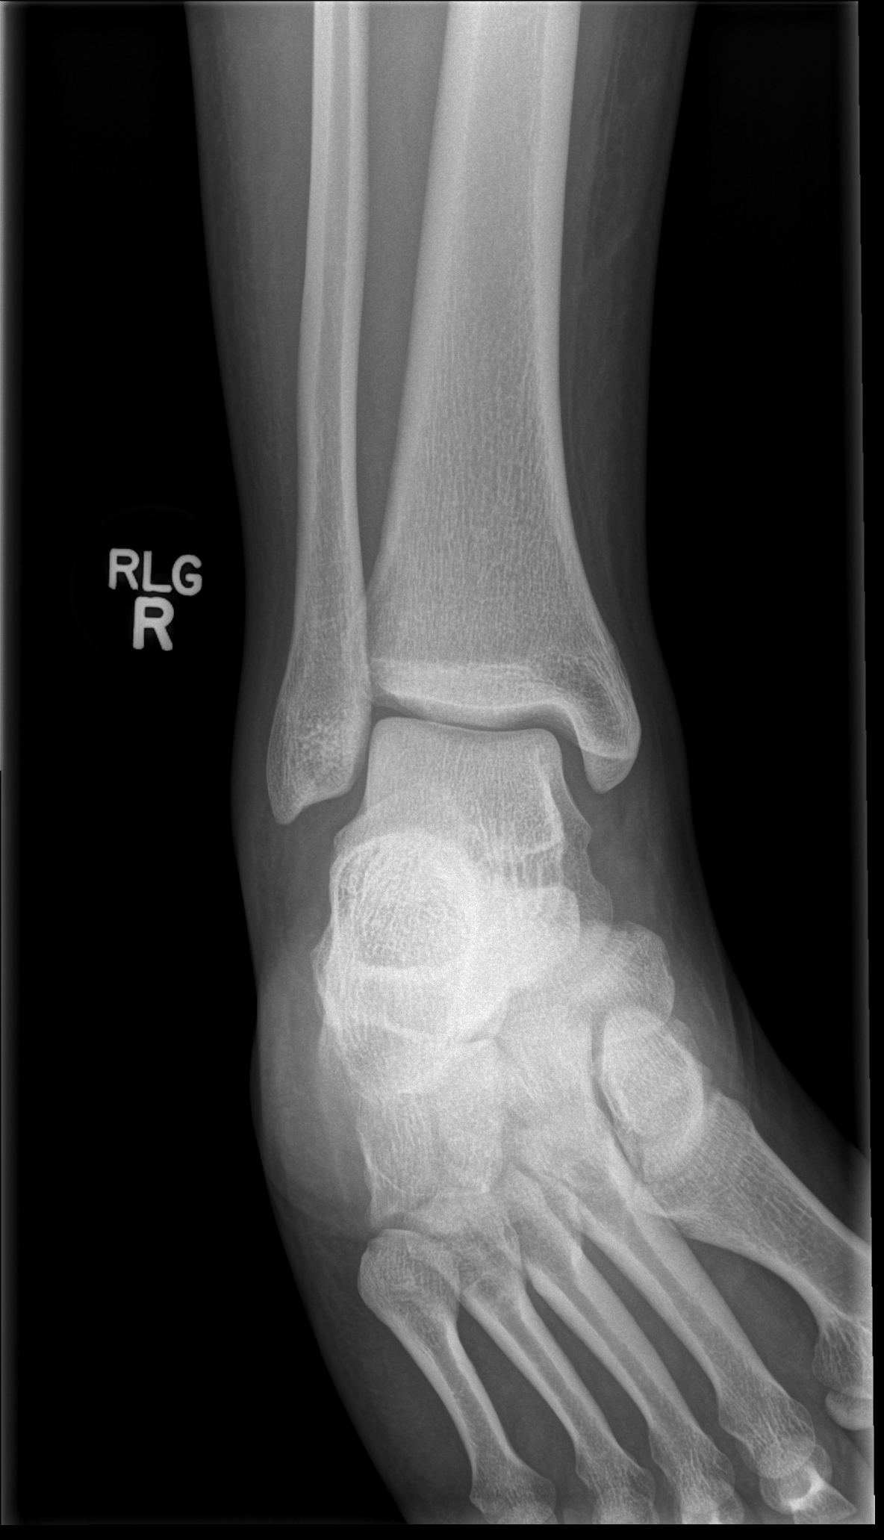

[x ankle lat right]
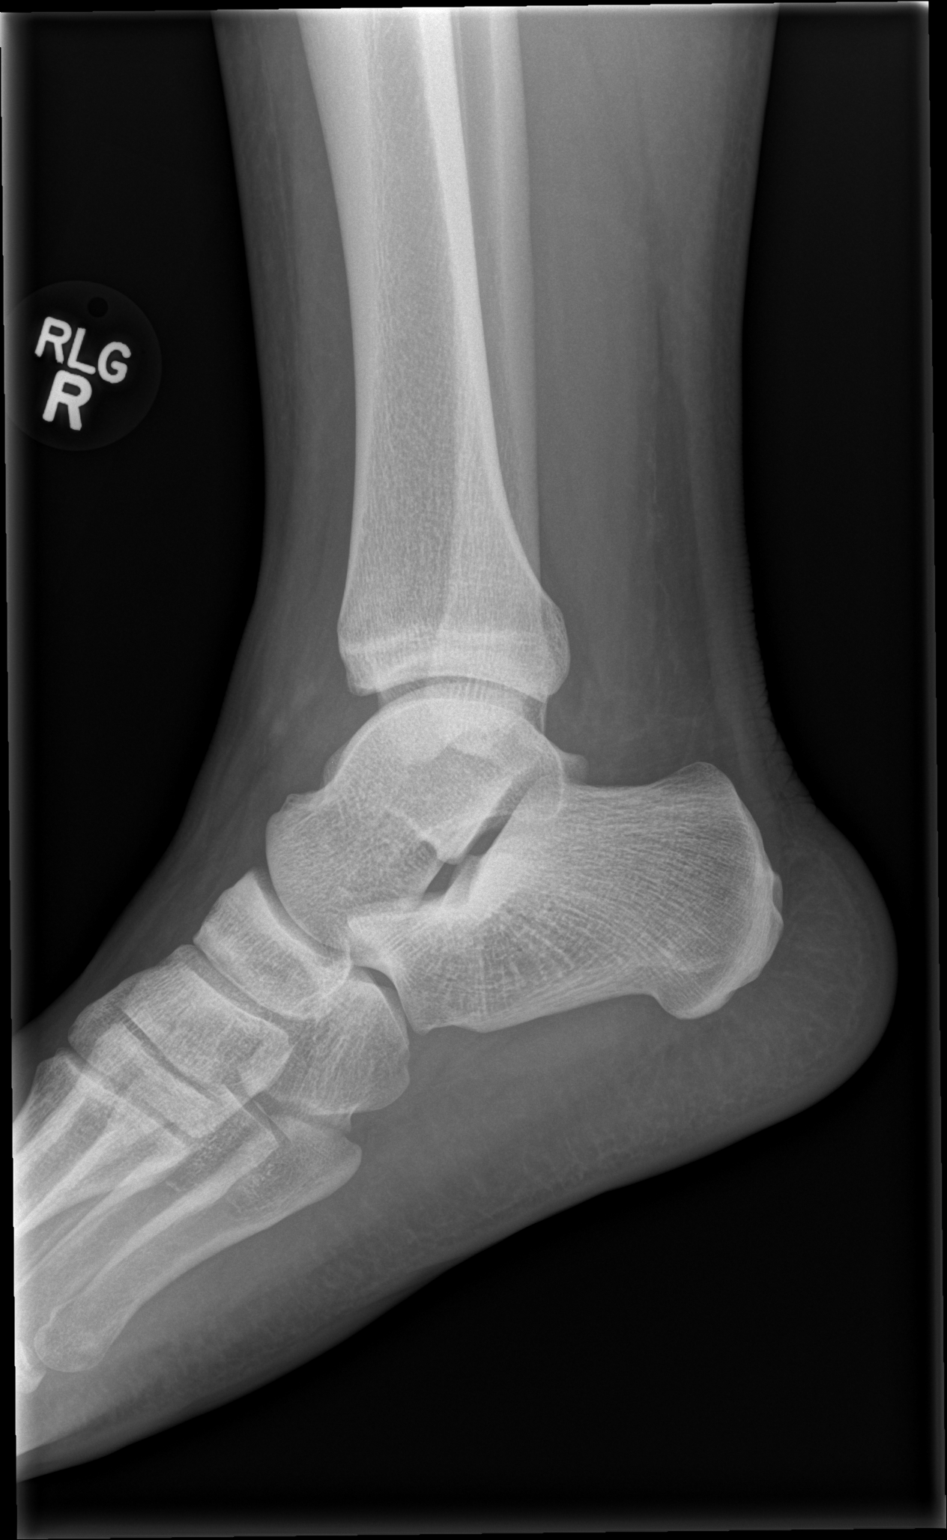

[3 of 3 positions shown; findings below may reference images not displayed]

FINDINGS: Mild diffuse soft tissue swelling. There is no evidence of
arthropathy or other focal bone abnormality. Soft tissues are
unremarkable.
IMPRESSION: 1. No acute bone abnormality.

## 2017-11-21 DIAGNOSIS — F4321 Adjustment disorder with depressed mood: Secondary | ICD-10-CM | POA: Diagnosis not present

## 2017-11-21 DIAGNOSIS — F1210 Cannabis abuse, uncomplicated: Secondary | ICD-10-CM | POA: Diagnosis not present

## 2017-11-21 DIAGNOSIS — F319 Bipolar disorder, unspecified: Secondary | ICD-10-CM | POA: Diagnosis present

## 2017-11-21 DIAGNOSIS — R45851 Suicidal ideations: Secondary | ICD-10-CM | POA: Insufficient documentation

## 2017-11-22 ENCOUNTER — Emergency Department (HOSPITAL_COMMUNITY)
Admission: EM | Admit: 2017-11-22 | Discharge: 2017-11-22 | Disposition: A | Discharge status: Home / Self Care | Payer: Medicaid Other | Attending: Emergency Medicine | Admitting: Emergency Medicine

## 2017-11-22 ENCOUNTER — Encounter (HOSPITAL_COMMUNITY): Payer: Self-pay | Admitting: *Deleted

## 2017-11-22 ENCOUNTER — Other Ambulatory Visit: Payer: Self-pay

## 2017-11-22 DIAGNOSIS — F4321 Adjustment disorder with depressed mood: Secondary | ICD-10-CM | POA: Diagnosis present

## 2017-11-22 DIAGNOSIS — R45851 Suicidal ideations: Secondary | ICD-10-CM

## 2017-11-22 LAB — COMPREHENSIVE METABOLIC PANEL
ALBUMIN: 4.2 g/dL (ref 3.5–5.0)
ALT: 14 U/L — AB (ref 17–63)
AST: 15 U/L (ref 15–41)
Alkaline Phosphatase: 57 U/L (ref 38–126)
Anion gap: 8 (ref 5–15)
BUN: 9 mg/dL (ref 6–20)
CHLORIDE: 107 mmol/L (ref 101–111)
CO2: 25 mmol/L (ref 22–32)
CREATININE: 0.75 mg/dL (ref 0.61–1.24)
Calcium: 9 mg/dL (ref 8.9–10.3)
GFR calc Af Amer: >60 mL/min (ref >60)
GFR calc non Af Amer: >60 mL/min (ref >60)
Glucose, Bld: 106 mg/dL — ABNORMAL HIGH (ref 65–99)
POTASSIUM: 3.4 mmol/L — AB (ref 3.5–5.1)
SODIUM: 140 mmol/L (ref 135–145)
Total Bilirubin: 0.5 mg/dL (ref 0.3–1.2)
Total Protein: 7.4 g/dL (ref 6.5–8.1)

## 2017-11-22 LAB — CBC
HCT: 43.9 % (ref 39.0–52.0)
HEMOGLOBIN: 14.1 g/dL (ref 13.0–17.0)
MCH: 28.1 pg (ref 26.0–34.0)
MCHC: 32.1 g/dL (ref 30.0–36.0)
MCV: 87.5 fL (ref 78.0–100.0)
Platelets: 223 10*3/uL (ref 150–400)
RBC: 5.02 MIL/uL (ref 4.22–5.81)
RDW: 14.1 % (ref 11.5–15.5)
WBC: 9.8 10*3/uL (ref 4.0–10.5)

## 2017-11-22 LAB — ACETAMINOPHEN LEVEL: Acetaminophen (Tylenol), Serum: <10 ug/mL — ABNORMAL LOW (ref 10–30)

## 2017-11-22 LAB — RAPID URINE DRUG SCREEN, HOSP PERFORMED
Amphetamines: NOT DETECTED
BENZODIAZEPINES: NOT DETECTED
Barbiturates: NOT DETECTED
Cocaine: NOT DETECTED
OPIATES: NOT DETECTED
TETRAHYDROCANNABINOL: POSITIVE — AB

## 2017-11-22 LAB — SALICYLATE LEVEL: Salicylate Lvl: <7 mg/dL (ref 2.8–30.0)

## 2017-11-22 LAB — ETHANOL: Alcohol, Ethyl (B): <10 mg/dL (ref <10)

## 2017-11-22 MED ORDER — NICOTINE 14 MG/24HR TD PT24: 14.0000 mg | MEDICATED_PATCH | Freq: Once | TRANSDERMAL | Status: DC

## 2017-11-22 NOTE — [facility] Assessment (Signed)
[facility]  Per Juanetta BeetsJacqueline Norman, DO, this pt does not require psychiatric hospitalization at this time.  [facility] treatment with [facility].  This has been included in pt's discharge instructions.  Pt's nurse, Morrie Sheldonshley, has been notified.  Doylene Canninghomas Aissata Wilmore, MA Triage Specialist [phone]

## 2017-11-22 NOTE — ED Triage Notes (Signed)
Pt stated "I was brought in by [facility] because I was having suicidal ideations.  They've been getting worse since I got prison 08/27/17.  [facility] and didn't want me to so I started going back to [facility]."  Pt denies A/V hallucinations.  Pt's plan to harm self is "to cut my throat".

## 2017-11-22 NOTE — ED Notes (Signed)
Bed: [facility] Expected date:  Expected time:  Means of arrival:  Comments: 

## 2017-11-22 NOTE — ED Notes (Signed)
Pt d/c home with ACT team per MD order. Discharge summary reviewed with pt, pt verbalizes understanding. Pt denies SI/HI/AVH. Personal property returned. Pt signed e-signature. Ambulatory off unit with [facility].

## 2017-11-22 NOTE — Progress Notes (Signed)
TTS consulted with Nira ConnJason Berry, NP who recommends inpt treatment. [facility] is currently reviewing for possible admission. EDP Ward, Chase PicketJaime Pilcher, PA-C and pt's nurse Consuella LoseElaine, RN notified of the disposition.  Princess BruinsAquicha Elizette Shek, MSW, [name] Therapeutic Triage Specialist  [phone]

## 2017-11-22 NOTE — [facility] Suicide Risk Assessment (Signed)
Suicide Risk Assessment  Discharge Assessment   [facility]   Principal Problem: Adjustment disorder with depressed mood Discharge Diagnoses:  Patient Active Problem List   Diagnosis Date Noted  . Adjustment disorder with depressed mood [F43.21] 11/22/2017    Priority: High  . Cannabis abuse [F12.10] 03/30/2012    Priority: Medium  . ADHD (attention deficit hyperactivity disorder), combined type [F90.2] 08/28/2011    Priority: Medium  . Methamphetamine abuse ([facility]) [F15.10] 03/08/2016    Priority: Low  . Suicidal thoughts [R45.851] 11/07/2016  . Bipolar 2 disorder ([facility]) [F31.81] 08/08/2016  . Bipolar affect, depressed ([facility]) [F31.30] 09/18/2015  . Bipolar affective disorder, currently depressed, mild ([facility]) [F31.31] 09/17/2015  . Intentional overdose of drug in tablet form ([facility]) [T50.902A]   . Depression [F32.9]   . Suicidal ideation [R45.851]   . Cannabis use disorder, moderate, dependence ([facility]) [F12.20] 06/06/2015  . Alcohol use disorder, moderate, dependence ([facility]) [F10.20] 06/06/2015  . Conduct disorder, adolescent onset type [F91.2] 08/28/2011    Total Time spent with patient: 45 minutes  Musculoskeletal: Strength & Muscle Tone: within normal limits Gait & Station: normal Patient leans: N/A  Psychiatric Specialty Exam:   Blood pressure (!) 105/55, pulse 72, temperature 97.6 F (36.4 C), temperature source Oral, resp. rate 16, height 5\' 11"  (1.803 m), weight 81.6 kg (180 lb), SpO2 98 %.Body mass index is 25.1 kg/m.  General Appearance: Casual  Eye Contact::  Good  Speech:  Normal Rate409  Volume:  Normal  Mood:  Depressed, mild  Affect:  Congruent  Thought Process:  Coherent and Descriptions of Associations: Intact  Orientation:  Full (Time, Place, and Person)  Thought Content:  WDL and Logical  Suicidal Thoughts:  No  Homicidal Thoughts:  No  Memory:  Immediate;   Good Recent;   Good Remote;   Good  Judgement:  Fair  Insight:  Fair   Psychomotor Activity:  Normal  Concentration:  Good  Recall:  Good  Fund of Knowledge:Fair  Language: Good  Akathisia:  No  Handed:  Right  AIMS (if indicated):     Assets:  Housing Leisure Time Physical Health Resilience Social Support  Sleep:     Cognition: WNL  ADL's:  Intact   Mental Status Per Nursing Assessment::   On Admission:   22 yo male who presented to the ED with suicidal ideations.  He reports he is having difficulty adjusting after discharging from prison in January.  [name] does have an ACT team who he did not call because it was so late last night when he had suicidal ideations.  Today, he is feeling better with no suicidal/homicidal ideations, hallucinations, or substance abuse.  His ACT team, [facility], was called to come and get him to continue his care.  Demographic Factors:  Male and Adolescent or young adult  Loss Factors: NA  Historical Factors: NA  Risk Reduction Factors:   Sense of responsibility to family, Living with another person, especially a relative, Positive social support and Positive therapeutic relationship  Continued Clinical Symptoms:  Depression, mild  Cognitive Features That Contribute To Risk:  None    Suicide Risk:  Minimal: No identifiable suicidal ideation.  Patients presenting with no risk factors but with morbid ruminations; may be classified as minimal risk based on the severity of the depressive symptoms    Plan Of Care/Follow-up recommendations:  Activity:  as tolerated Diet:  heart healthy diet  LORD, JAMISON, NP 11/22/2017, 9:17 AM

## 2017-11-22 NOTE — BH Assessment (Addendum)
Assessment Note  Ryan Bartlett is an 22 y.o. male who presents to the ED voluntarily. Pt reports he has been experiencing suicidal ideations with a plan to cut his throat. Pt identifies his stressors as recently being released from prison in January 2019. Pt states he was incarcerated for 7 months and has been having a difficult time adjusting to being home. Pt states he has attempted suicide multiple times in the past and has been admitted to multiple facilities c/o similar concerns. Pt is unable to contract for safety at this time.   Pt states he is working at [employer] and lives with his grandmother. Pt denies any other stressors at present. Pt states he is prescribed Prozac and Trazodone but states he does not take the medication as prescribed.   TTS consulted with Nira ConnJason Berry, NP who recommends inpt treatment. [facility] is currently reviewing for possible admission. EDP Ward, Chase PicketJaime Pilcher, PA-C and pt's nurse Consuella LoseElaine, RN notified of the disposition.  Diagnosis: Bipolar I disorder, current episode depressed; Cannabis use disorder, severe   Past Medical History:  Past Medical History:  Diagnosis Date  . ADHD (attention deficit hyperactivity disorder)   . Arthritis   . Asthma   . Bipolar disorder (HCC)   . Depression   . Oppositional defiant disorder   . Unspecified episodic mood disorder     Past Surgical History:  Procedure Laterality Date  . NO PAST SURGERIES      Family History:  Family History  Problem Relation Age of Onset  . Depression Mother   . Diabetes Other   . Hyperlipidemia Other   . Hypertension Other     Social History:  reports that he has been smoking cigarettes.  He has been smoking about 0.50 packs per day for the past 0.00 years. He has never used smokeless tobacco. He reports that he has current or past drug history. Drugs: Marijuana and Cocaine. He reports that he does not drink alcohol.  Additional Social History:  Alcohol / Drug Use Pain Medications: See  MAR Prescriptions: See MAR Over the Counter: See MAR History of alcohol / drug use?: Yes Longest period of sobriety (when/how long): none reported Substance #1 Name of Substance 1: Alcohol 1 - Age of First Use: teenager 1 - Amount (size/oz): bottle of smirnoff  1 - Frequency: occasional 1 - Duration: ongoing 1 - Last Use / Amount: 11/21/17 Substance #2 Name of Substance 2: Cannabis 2 - Age of First Use: teenager 2 - Amount (size/oz): 1 blunt 2 - Frequency: daily 2 - Duration: ongoing 2 - Last Use / Amount: 11/21/17  CIWA: CIWA-Ar BP: (!) 105/55 Pulse Rate: 63 COWS:    Allergies:  Allergies  Allergen Reactions  . Peanut-Containing Drug Products Anaphylaxis  . Lactose Intolerance (Gi) Diarrhea and Nausea And Vomiting    Home Medications:  (Not in a hospital admission)  OB/GYN Status:  No LMP for male patient.  General Assessment Data Location of Assessment: [facility] TTS Assessment: In system Is this a Tele or Face-to-Face Assessment?: Face-to-Face Is this an Initial Assessment or a Re-assessment for this encounter?: Initial Assessment Marital status: Single Is patient pregnant?: No Pregnancy Status: No Living Arrangements: Other relatives(grandmother) Can pt return to current living arrangement?: Yes Admission Status: Voluntary Is patient capable of signing voluntary admission?: Yes Referral Source: Self/Family/Friend Insurance type: none     Crisis Care Plan Living Arrangements: Other relatives(grandmother) Name of Psychiatrist: [facility]  Name of Therapist: [facility]   Education Status  Is patient currently in school?: No Is the patient employed, unemployed or receiving disability?: Unemployed  Risk to self with the past 6 months Suicidal Ideation: Yes-Currently Present Has patient been a risk to self within the past 6 months prior to admission? : No Suicidal Intent: No Has patient had any suicidal intent within the past 6  months prior to admission? : No Is patient at risk for suicide?: Yes Suicidal Plan?: Yes-Currently Present Has patient had any suicidal plan within the past 6 months prior to admission? : Yes Specify Current Suicidal Plan: pt states he has thought about cutting his throat  Access to Means: Yes Specify Access to Suicidal Means: pt has access to sharps  What has been your use of drugs/alcohol within the last 12 months?: reports to using cannabis daily and alcohol on occasion  Previous Attempts/Gestures: Yes How many times?: 3 Triggers for Past Attempts: Unpredictable Intentional Self Injurious Behavior: None Family Suicide History: No Recent stressful life event(s): Turmoil (Comment)(released from prison ) Persecutory voices/beliefs?: No Depression: Yes Depression Symptoms: Insomnia, Feeling worthless/self pity, Loss of interest in usual pleasures, Guilt, Isolating, Despondent Substance abuse history and/or treatment for substance abuse?: Yes Suicide prevention information given to non-admitted patients: Not applicable  Risk to Others within the past 6 months Homicidal Ideation: No Does patient have any lifetime risk of violence toward others beyond the six months prior to admission? : No Thoughts of Harm to Others: No Current Homicidal Intent: No Current Homicidal Plan: No Access to Homicidal Means: No History of harm to others?: No Assessment of Violence: None Noted Does patient have access to weapons?: No Criminal Charges Pending?: No Does patient have a court date: No Is patient on probation?: No  Psychosis Hallucinations: None noted Delusions: None noted  Mental Status Report Appearance/Hygiene: Unremarkable, In scrubs Eye Contact: Fair Motor Activity: Freedom of movement Speech: Logical/coherent Level of Consciousness: Quiet/awake, Drowsy Mood: Depressed, Despair, Worthless, low self-esteem Affect: Depressed, Sad Anxiety Level: None Thought Processes: Coherent,  Relevant Judgement: Impaired Orientation: Person, Place, Time, Situation, Appropriate for developmental age Obsessive Compulsive Thoughts/Behaviors: None  Cognitive Functioning Concentration: Normal Memory: Remote Intact, Recent Intact Is patient IDD: No Is patient DD?: No Insight: Poor Impulse Control: Poor Appetite: Fair Have you had any weight changes? : No Change Sleep: Decreased Total Hours of Sleep: 3 Vegetative Symptoms: None  ADLScreening [facility] Assessment Services) Patient's cognitive ability adequate to safely complete daily activities?: Yes Patient able to express need for assistance with ADLs?: Yes Independently performs ADLs?: Yes (appropriate for developmental age)  Prior Inpatient Therapy Prior Inpatient Therapy: Yes Prior Therapy Dates: 2018 and other visits  Prior Therapy Facilty/Provider(s): [facility], [facility], [facility]  Reason for Treatment: BIPOLAR DISORDER, SI  Prior Outpatient Therapy Prior Outpatient Therapy: Yes Prior Therapy Dates: current Prior Therapy Facilty/Provider(s): [facility] Reason for Treatment: med management  Does patient have an ACCT team?: Yes Does patient have Intensive In-House Services?  : No Does patient have [facility] services? : No Does patient have P4CC services?: No  ADL Screening (condition at time of admission) Patient's cognitive ability adequate to safely complete daily activities?: Yes Is the patient deaf or have difficulty hearing?: No Does the patient have difficulty seeing, even when wearing glasses/contacts?: No Does the patient have difficulty concentrating, remembering, or making decisions?: No Patient able to express need for assistance with ADLs?: Yes Does the patient have difficulty dressing or bathing?: No Independently performs ADLs?: Yes (appropriate for developmental age) Does the patient have difficulty walking or  climbing stairs?: No Weakness of Legs: None Weakness of Arms/Hands: None  Home  Assistive Devices/Equipment Home Assistive Devices/Equipment: None    Abuse/Neglect Assessment (Assessment to be complete while patient is alone) Abuse/Neglect Assessment Can Be Completed: Yes Physical Abuse: Denies Verbal Abuse: Denies Sexual Abuse: Denies Exploitation of patient/patient's resources: Denies Self-Neglect: Denies     Merchant navy officer (For Healthcare) Does Patient Have a Medical Advance Directive?: No Would patient like information on creating a medical advance directive?: No - Patient declined    Additional Information 1:1 In Past 12 Months?: No CIRT Risk: No Elopement Risk: No Does patient have medical clearance?: Yes     Disposition: TTS consulted with Nira Conn, NP who recommends inpt treatment. [facility] is currently reviewing for possible admission. EDP Ward, Chase Picket, PA-C and pt's nurse Consuella Lose, RN notified of the disposition.  Disposition Initial Assessment Completed for this Encounter: Yes Disposition of Patient: Admit Type of inpatient treatment program: Adult(per Nira Conn, NP) Patient refused recommended treatment: No  On Site Evaluation by:   Reviewed with Physician:    Karolee Ohs 11/22/2017 3:41 AM

## 2017-11-22 NOTE — ED Notes (Signed)
TTS assessment in progress. 

## 2017-11-22 NOTE — Discharge Instructions (Signed)
For your behavioral health needs, you are advised to continue treatment with the [facility] ACT [facility]: ° °     [facility] °     [address] °     [place], [place] [address] °     [phone] °

## 2017-11-22 NOTE — ED Provider Notes (Addendum)
[facility]-EMERGENCY DEPT Provider Note   CSN: [id number] Arrival date & time: 11/21/17  2355     History   Chief Complaint Chief Complaint  Patient presents with  . suicidal ideations    HPI Ryan Bartlett is a 22 y.o. male.  The history is provided by the patient and medical records. No language interpreter was used.   Ryan Bartlett is a 22 y.o. male  with a PMH of bipolar disorder, asthma who presents to the Emergency Department complaining of suicidal thoughts.  Patient reports that he lives with his grandmother who has been very controlling.  Today grandmother called him names that offended him.  He had thoughts of cutting his neck in an attempt to kill himself.  He denies any actual attempt at self-harm.  Denies homicidal ideations, auditory or visual hallucinations.  Patient denies taking any medications regularly, however it does appear that he should be on several psychiatric medications.  He states that he went to prison and has not been taking any of these medicines in several months since released.   Past Medical History:  Diagnosis Date  . ADHD (attention deficit hyperactivity disorder)   . Arthritis   . Asthma   . Bipolar disorder (HCC)   . Depression   . Oppositional defiant disorder   . Unspecified episodic mood disorder     Patient Active Problem List   Diagnosis Date Noted  . Suicidal thoughts 11/07/2016  . Bipolar 2 disorder (HCC) 08/08/2016  . Methamphetamine abuse (HCC) 03/08/2016  . Bipolar affect, depressed (HCC) 09/18/2015  . Bipolar affective disorder, currently depressed, mild (HCC) 09/17/2015  . Intentional overdose of drug in tablet form (HCC)   . Depression   . Suicidal ideation   . Cannabis use disorder, moderate, dependence (HCC) 06/06/2015  . Alcohol use disorder, moderate, dependence (HCC) 06/06/2015  . Bipolar I disorder, most recent episode depressed (HCC) 05/23/2013  . Cannabis abuse 03/30/2012  . ADHD  (attention deficit hyperactivity disorder), combined type 08/28/2011  . Conduct disorder, adolescent onset type 08/28/2011    Past Surgical History:  Procedure Laterality Date  . NO PAST SURGERIES          Home Medications    Prior to Admission medications   Medication Sig Start Date End Date Taking? Authorizing Provider  FLUoxetine (PROZAC) 40 MG capsule Take 40 mg by mouth daily.   Yes [provider]  traZODone (DESYREL) 50 MG tablet Take 1 tablet (50 mg total) by mouth at bedtime as needed for sleep. 11/12/16  Yes Armandina Stammer I, NP  divalproex (DEPAKOTE ER) 250 MG 24 hr tablet Take 1 tablet (250 mg total) by mouth 2 (two) times daily. For mood stabilization Patient not taking: Reported on 11/22/2017 11/12/16   Armandina Stammer I, NP  hydrOXYzine (ATARAX/VISTARIL) 25 MG tablet Take 1 tablet (25 mg total) by mouth 3 (three) times daily as needed for anxiety. Patient not taking: Reported on 12/06/2016 11/12/16   Armandina Stammer I, NP  nicotine polacrilex (NICORETTE) 2 MG gum Take 1 each (2 mg total) by mouth as needed for smoking cessation. Patient not taking: Reported on 12/06/2016 11/12/16   Armandina Stammer I, NP  QUEtiapine (SEROQUEL) 100 MG tablet Take 1 tablet (100 mg total) by mouth at bedtime. For mood control Patient not taking: Reported on 11/22/2017 11/12/16   Armandina Stammer I, NP  QUEtiapine (SEROQUEL) 50 MG tablet Take 1 tablet (50 mg) every 6 hours as needed: For agitation Patient not  taking: Reported on 11/22/2017 11/12/16   Sanjuana KavaNwoko, Agnes I, NP    Family History Family History  Problem Relation Age of Onset  . Depression Mother   . Diabetes Other   . Hyperlipidemia Other   . Hypertension Other     Social History Social History   Tobacco Use  . Smoking status: Current Every Day Smoker    Packs/day: 0.50    Years: 0.00    Pack years: 0.00    Types: Cigarettes  . Smokeless tobacco: Never Used  Substance Use Topics  . Alcohol use: No  . Drug use: Yes    Types: Marijuana,  Cocaine     Allergies   Peanut-containing drug products and Lactose intolerance (gi)   Review of Systems Review of Systems  Psychiatric/Behavioral: Positive for suicidal ideas.  All other systems reviewed and are negative.    Physical Exam Updated Vital Signs BP (!) 105/55 (BP Location: Right Arm)   Pulse 72   Temp 97.6 F (36.4 C) (Oral)   Resp 16   Ht 5\' 11"  (1.803 m)   Wt 81.6 kg (180 lb)   SpO2 98%   BMI 25.10 kg/m   Physical Exam  Constitutional: He is oriented to person, place, and time. He appears well-developed and well-nourished. No distress.  HENT:  Head: Normocephalic and atraumatic.  Cardiovascular: Normal rate, regular rhythm and normal heart sounds.  No murmur heard. Pulmonary/Chest: Effort normal and breath sounds normal. No respiratory distress.  Abdominal: Soft. He exhibits no distension. There is no tenderness.  Musculoskeletal: He exhibits no edema.  Neurological: He is alert and oriented to person, place, and time.  Skin: Skin is warm and dry.  Nursing note and vitals reviewed.    ED Treatments / Results  Labs (all labs ordered are listed, but only abnormal results are displayed) Labs Reviewed  COMPREHENSIVE METABOLIC PANEL - Abnormal; Notable for the following components:      Result Value   Potassium 3.4 (*)    Glucose, Bld 106 (*)    ALT 14 (*)    All other components within normal limits  ACETAMINOPHEN LEVEL - Abnormal; Notable for the following components:   Acetaminophen (Tylenol), Serum <10 (*)    All other components within normal limits  RAPID URINE DRUG SCREEN, HOSP PERFORMED - Abnormal; Notable for the following components:   Tetrahydrocannabinol POSITIVE (*)    All other components within normal limits  ETHANOL  SALICYLATE LEVEL  CBC    EKG None  Radiology No results found.  Procedures Procedures (including critical care time)  Medications Ordered in ED Medications  nicotine (NICODERM CQ - dosed in mg/24 hours)  patch 14 mg (14 mg Transdermal Refused 11/22/17 0337)     Initial Impression / Assessment and Plan / ED Course  I have reviewed the triage vital signs and the nursing notes.  Pertinent labs & imaging results that were available during my care of the patient were reviewed by me and considered in my medical decision making (see chart for details).    Ryan Bartlett is a 22 y.o. male who presents to ED for suicidal thoughts with plan to cut his neck. Labs reviewed and reassuring. Medically cleared. TTS evaluated patient and recommending inpatient treatment.   Final Clinical Impressions(s) / ED Diagnoses   Final diagnoses:  Suicidal thoughts    ED Discharge Orders    None         Marvis Bakken, Chase PicketJaime Pilcher, PA-C 11/22/17 0435    Preston FleetingGlick,  Onalee Hua, MD 11/22/17 [id number]

## 2017-11-22 NOTE — ED Notes (Signed)
SBAR Report received from previous nurse. Pt received calm and visible on unit. Pt resting and unable to participate in assessment of current SI/ HI, A/V H, depression, anxiety, or pain at this time, but appears otherwise stable and free of distress. Pt reminded of camera surveillance, q 15 min rounds, and rules of the milieu. Will continue to assess.

## 2017-12-03 ENCOUNTER — Encounter (HOSPITAL_COMMUNITY): Payer: Self-pay | Admitting: Emergency Medicine

## 2017-12-03 ENCOUNTER — Emergency Department (HOSPITAL_COMMUNITY): Admission: EM | Admit: 2017-12-03 | Discharge: 2017-12-03 | Discharge status: Against Medical Advice | Payer: Self-pay

## 2017-12-03 NOTE — ED Triage Notes (Signed)
Pt from home stating he needs an STD test because he has "been having a ton of unprotected sex" pt denies pain or discharge

## 2017-12-03 NOTE — ED Notes (Signed)
Pt called to be taken to a treatment room x 1 with no answer

## 2017-12-19 ENCOUNTER — Encounter (HOSPITAL_COMMUNITY): Payer: Self-pay | Admitting: Emergency Medicine

## 2017-12-19 ENCOUNTER — Emergency Department (HOSPITAL_COMMUNITY)
Admission: EM | Admit: 2017-12-19 | Discharge: 2017-12-19 | Disposition: A | Discharge status: Home / Self Care | Payer: Medicaid Other | Attending: Emergency Medicine | Admitting: Emergency Medicine

## 2017-12-19 ENCOUNTER — Other Ambulatory Visit: Payer: Self-pay

## 2017-12-19 DIAGNOSIS — S60519A Abrasion of unspecified hand, initial encounter: Secondary | ICD-10-CM

## 2017-12-19 DIAGNOSIS — S60512A Abrasion of left hand, initial encounter: Secondary | ICD-10-CM | POA: Insufficient documentation

## 2017-12-19 DIAGNOSIS — S60511A Abrasion of right hand, initial encounter: Secondary | ICD-10-CM | POA: Diagnosis not present

## 2017-12-19 DIAGNOSIS — Z202 Contact with and (suspected) exposure to infections with a predominantly sexual mode of transmission: Secondary | ICD-10-CM | POA: Diagnosis present

## 2017-12-19 DIAGNOSIS — Z79899 Other long term (current) drug therapy: Secondary | ICD-10-CM | POA: Insufficient documentation

## 2017-12-19 DIAGNOSIS — Y999 Unspecified external cause status: Secondary | ICD-10-CM | POA: Diagnosis not present

## 2017-12-19 DIAGNOSIS — Y929 Unspecified place or not applicable: Secondary | ICD-10-CM | POA: Diagnosis not present

## 2017-12-19 DIAGNOSIS — Y939 Activity, unspecified: Secondary | ICD-10-CM | POA: Insufficient documentation

## 2017-12-19 DIAGNOSIS — F17210 Nicotine dependence, cigarettes, uncomplicated: Secondary | ICD-10-CM | POA: Insufficient documentation

## 2017-12-19 DIAGNOSIS — Z711 Person with feared health complaint in whom no diagnosis is made: Secondary | ICD-10-CM

## 2017-12-19 DIAGNOSIS — F259 Schizoaffective disorder, unspecified: Secondary | ICD-10-CM | POA: Insufficient documentation

## 2017-12-19 HISTORY — DX: Post-traumatic stress disorder, unspecified: F43.10

## 2017-12-19 HISTORY — DX: Schizoaffective disorder, unspecified: F25.9

## 2017-12-19 MED ORDER — BACITRACIN ZINC 500 UNIT/GM EX OINT
TOPICAL_OINTMENT | Freq: Two times a day (BID) | CUTANEOUS | Status: DC
  Administered 2017-12-19: 22:00:00 via TOPICAL
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
## 2017-12-20 LAB — GC/CHLAMYDIA PROBE AMP (~~LOC~~) NOT AT ARMC
Chlamydia: NEGATIVE
Neisseria Gonorrhea: NEGATIVE

## 2017-12-20 LAB — RPR: RPR: NONREACTIVE

## 2017-12-20 LAB — HIV ANTIBODY (ROUTINE TESTING W REFLEX): HIV SCREEN 4TH GENERATION: NONREACTIVE

## 2018-01-15 ENCOUNTER — Emergency Department (HOSPITAL_COMMUNITY): Payer: Medicaid Other

## 2018-01-15 ENCOUNTER — Other Ambulatory Visit: Payer: Self-pay

## 2018-01-15 ENCOUNTER — Emergency Department (HOSPITAL_COMMUNITY)
Admission: EM | Admit: 2018-01-15 | Discharge: 2018-01-15 | Disposition: A | Discharge status: Home / Self Care | Payer: Medicaid Other | Attending: Emergency Medicine | Admitting: Emergency Medicine

## 2018-01-15 ENCOUNTER — Encounter (HOSPITAL_COMMUNITY): Payer: Self-pay

## 2018-01-15 DIAGNOSIS — J45909 Unspecified asthma, uncomplicated: Secondary | ICD-10-CM | POA: Insufficient documentation

## 2018-01-15 DIAGNOSIS — S8391XA Sprain of unspecified site of right knee, initial encounter: Secondary | ICD-10-CM | POA: Diagnosis not present

## 2018-01-15 DIAGNOSIS — W010XXA Fall on same level from slipping, tripping and stumbling without subsequent striking against object, initial encounter: Secondary | ICD-10-CM | POA: Insufficient documentation

## 2018-01-15 DIAGNOSIS — Y9339 Activity, other involving climbing, rappelling and jumping off: Secondary | ICD-10-CM | POA: Insufficient documentation

## 2018-01-15 DIAGNOSIS — F17210 Nicotine dependence, cigarettes, uncomplicated: Secondary | ICD-10-CM | POA: Insufficient documentation

## 2018-01-15 DIAGNOSIS — Z79899 Other long term (current) drug therapy: Secondary | ICD-10-CM | POA: Insufficient documentation

## 2018-01-15 DIAGNOSIS — Y999 Unspecified external cause status: Secondary | ICD-10-CM | POA: Insufficient documentation

## 2018-01-15 DIAGNOSIS — Y929 Unspecified place or not applicable: Secondary | ICD-10-CM | POA: Insufficient documentation

## 2018-01-15 DIAGNOSIS — S8991XA Unspecified injury of right lower leg, initial encounter: Secondary | ICD-10-CM | POA: Diagnosis present

## 2018-01-15 MED ORDER — IBUPROFEN 600 MG PO TABS: 600.0000 mg | ORAL_TABLET | Freq: Four times a day (QID) | ORAL | 0 refills | Status: DC | PRN
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
## 2018-01-15 NOTE — Discharge Instructions (Addendum)
Use knee immobilizer and crutches until you can walk comfortably without using them. Ice to reduce swelling. Ibuprofen every 6 hours for pain and inflammation.

## 2018-01-15 NOTE — ED Notes (Signed)
[facility] has been called. 

## 2018-01-15 NOTE — ED Provider Notes (Signed)
[name]-EMERGENCY DEPT Provider Note   CSN: [id number] Arrival date & time: 01/15/18  2133     History   Chief Complaint Chief Complaint  Patient presents with  . Knee Pain    HPI Ryan Bartlett is a 22 y.o. male.  Patient presents with right knee and lower leg pain after jumping a fence earlier today and falling to the ground. He states he is unable to bear weight or bend the knee. No other injury during the fall.   The history is provided by the patient.  Knee Pain   Pertinent negatives include no numbness.    Past Medical History:  Diagnosis Date  . ADHD (attention deficit hyperactivity disorder)   . Arthritis   . Asthma   . Bipolar disorder (HCC)   . Depression   . Oppositional defiant disorder   . PTSD (post-traumatic stress disorder)   . Schizoaffective disorder (HCC)   . Unspecified episodic mood disorder     Patient Active Problem List   Diagnosis Date Noted  . Adjustment disorder with depressed mood 11/22/2017  . Suicidal thoughts 11/07/2016  . Bipolar 2 disorder (HCC) 08/08/2016  . Methamphetamine abuse (HCC) 03/08/2016  . Bipolar affect, depressed (HCC) 09/18/2015  . Bipolar affective disorder, currently depressed, mild (HCC) 09/17/2015  . Intentional overdose of drug in tablet form (HCC)   . Depression   . Suicidal ideation   . Cannabis use disorder, moderate, dependence (HCC) 06/06/2015  . Alcohol use disorder, moderate, dependence (HCC) 06/06/2015  . Cannabis abuse 03/30/2012  . ADHD (attention deficit hyperactivity disorder), combined type 08/28/2011  . Conduct disorder, adolescent onset type 08/28/2011    Past Surgical History:  Procedure Laterality Date  . NO PAST SURGERIES          Home Medications    Prior to Admission medications   Medication Sig Start Date End Date Taking? Authorizing Provider  FLUoxetine (PROZAC) 40 MG capsule Take 40 mg by mouth daily.   Yes [provider]  traZODone  (DESYREL) 50 MG tablet Take 1 tablet (50 mg total) by mouth at bedtime as needed for sleep. 11/12/16  Yes Sanjuana Kava, NP    Family History Family History  Problem Relation Age of Onset  . Depression Mother   . Diabetes Other   . Hyperlipidemia Other   . Hypertension Other     Social History Social History   Tobacco Use  . Smoking status: Current Every Day Smoker    Packs/day: 0.50    Years: 0.00    Pack years: 0.00    Types: Cigarettes  . Smokeless tobacco: Never Used  Substance Use Topics  . Alcohol use: Yes    Comment: minimal  . Drug use: Yes    Types: Marijuana     Allergies   Peanut-containing drug products and Lactose intolerance (gi)   Review of Systems Review of Systems  Constitutional: Negative for diaphoresis.  Cardiovascular: Negative for leg swelling.  Musculoskeletal: Negative for back pain and neck pain.       See HPI.  Skin:       Abrasion anterior right knee  Neurological: Negative for weakness and numbness.     Physical Exam Updated Vital Signs BP 129/82 (BP Location: Right Arm)   Pulse 78   Temp 98.7 F (37.1 C) (Oral)   Resp 18   Ht  (1.803 m)   Wt 88 kg (194 lb)   SpO2 95%   BMI 27.06 kg/m  Physical Exam  Constitutional: He is oriented to person, place, and time. He appears well-developed and well-nourished.  Neck: Normal range of motion.  Cardiovascular: Intact distal pulses.  Pulmonary/Chest: Effort normal.  Musculoskeletal: Normal range of motion.  Right lower extremity has no swelling or deformity. Mild tenderness to lateral lower leg and circumferential knee tenderness. No joint laxity. FROM hip, ankle and foot with full strength.  Neurological: He is alert and oriented to person, place, and time. No sensory deficit.  Skin: Skin is warm and dry.  Superficial abrasion to anterior right knee.  Psychiatric: He has a normal mood and affect.     ED Treatments / Results  Labs (all labs ordered are listed, but only  abnormal results are displayed) Labs Reviewed - No data to display  EKG None  Radiology Dg Knee Complete 4 Views Right  Result Date: 01/15/2018 CLINICAL DATA:  Injury to the right knee, with patellar pain and abrasion. Initial encounter. EXAM: RIGHT KNEE - COMPLETE 4+ VIEW COMPARISON:  Right knee radiographs performed 10/26/2016 FINDINGS: There is no evidence of fracture or dislocation. The joint spaces are preserved. No significant degenerative change is seen; the patellofemoral joint is grossly unremarkable in appearance. No significant joint effusion is seen. The visualized soft tissues are normal in appearance. IMPRESSION: No evidence of fracture or dislocation. Electronically Signed   By: Roanna Raider M.D.   On: 01/15/2018 22:22    Procedures Procedures (including critical care time)  Medications Ordered in ED Medications - No data to display   Initial Impression / Assessment and Plan / ED Course  I have reviewed the triage vital signs and the nursing notes.  Pertinent labs & imaging results that were available during my care of the patient were reviewed by me and considered in my medical decision making (see chart for details).     Patient here with right lower extremity injury after fall, worst pain at the knee and lateral lower leg. No deficits on exam, no joint laxity, negative x-ray. Will provide knee immobilizer and crutches.   Final Clinical Impressions(s) / ED Diagnoses   Final diagnoses:  None   1. Right knee strain  ED Discharge Orders    None       Danne Harbor 01/15/18 2309    Gerhard Munch, MD 01/15/18 [phone]

## 2018-02-01 ENCOUNTER — Other Ambulatory Visit: Payer: Self-pay

## 2018-02-01 ENCOUNTER — Encounter (HOSPITAL_COMMUNITY): Payer: Self-pay | Admitting: Emergency Medicine

## 2018-02-01 ENCOUNTER — Emergency Department (HOSPITAL_COMMUNITY)
Admission: EM | Admit: 2018-02-01 | Discharge: 2018-02-01 | Disposition: A | Discharge status: Home / Self Care | Payer: Medicaid Other | Attending: Emergency Medicine | Admitting: Emergency Medicine

## 2018-02-01 DIAGNOSIS — J45909 Unspecified asthma, uncomplicated: Secondary | ICD-10-CM | POA: Diagnosis not present

## 2018-02-01 DIAGNOSIS — M546 Pain in thoracic spine: Secondary | ICD-10-CM

## 2018-02-01 DIAGNOSIS — Z79899 Other long term (current) drug therapy: Secondary | ICD-10-CM | POA: Insufficient documentation

## 2018-02-01 DIAGNOSIS — Z91010 Allergy to peanuts: Secondary | ICD-10-CM | POA: Insufficient documentation

## 2018-02-01 DIAGNOSIS — F17210 Nicotine dependence, cigarettes, uncomplicated: Secondary | ICD-10-CM | POA: Diagnosis not present

## 2018-02-01 MED ORDER — IBUPROFEN 600 MG PO TABS: 600.0000 mg | ORAL_TABLET | Freq: Four times a day (QID) | ORAL | 0 refills | Status: DC | PRN

## 2018-02-01 MED ORDER — IBUPROFEN 200 MG PO TABS
600.0000 mg | ORAL_TABLET | Freq: Once | ORAL | Status: AC
  Administered 2018-02-01: 600 mg via ORAL
  Filled 2018-02-01: qty 3

## 2018-02-01 MED ORDER — ACETAMINOPHEN 325 MG PO TABS: 650.0000 mg | ORAL_TABLET | Freq: Four times a day (QID) | ORAL | 0 refills | Status: DC | PRN

## 2018-02-01 MED ORDER — METHOCARBAMOL 500 MG PO TABS
500.0000 mg | ORAL_TABLET | Freq: Once | ORAL | Status: AC
  Administered 2018-02-01: 500 mg via ORAL
  Filled 2018-02-01: qty 1

## 2018-02-01 NOTE — ED Triage Notes (Signed)
[facility], worse this am. Pt ambulatory, states it feels better when lying flat. Pt states this has never happened before. Pain now 10/10.

## 2018-02-01 NOTE — ED Notes (Signed)
Bed: WA06 Expected date:  Expected time:  Means of arrival:  Comments: 22 yo back pain

## 2018-02-01 NOTE — Discharge Instructions (Addendum)
Please see the information and instructions below regarding your visit.  Your diagnoses today include:  1. Acute bilateral thoracic back pain    About diagnosis. Most episodes of acute back pain are self-limited. Your exam was reassuring today that the source of your pain is not affecting the spinal cord and nerves that originate in the spinal cord.   If you have a history of disc herniation or arthritis in your spine, the nerves exiting the spine on one side get inflamed. This can cause severe pain. We call this radiculopathy. We do not always know what causes the sudden inflammation.  Tests performed today include: See side panel of your discharge paperwork for testing performed today. Vital signs are listed at the bottom of these instructions.   Medications prescribed:    Take any prescribed medications only as prescribed, and any over the counter medications only as directed on the packaging.  You are prescribed ibuprofen, a non-steroidal anti-inflammatory agent (NSAID) for pain. You may take 600mg  every 6 hours as needed for pain. If still requiring this medication around the clock for acute pain after 10 days, please see your primary healthcare provider.  Women who are pregnant, breastfeeding, or planning on becoming pregnant should not take non-steroidal anti-inflammatories such as Advil and Aleve. Tylenol is a safe over the counter pain reliever in pregnant women.  You may combine this medication with Tylenol, 650 mg every 6 hours, so you are receiving something for pain every 3 hours.  This is not a long-term medication unless under the care and direction of your primary provider. Taking this medication long-term and not under the supervision of a healthcare provider could increase the risk of stomach ulcers, kidney problems, and cardiovascular problems such as high blood pressure.    Home care instructions:   Low back pain gets worse the longer you stay stationary. Please keep  moving and walking as tolerated. There are exercises included in this packet to perform as tolerated for your low back pain.   Apply heat to the areas that are painful. Avoid twisting or bending your trunk to lift something. Do not lift anything above 25 lbs while recovering from this flare of low back pain.  Please follow any educational materials contained in this packet.   Follow-up instructions: Please follow-up with your primary care provider in one week for further evaluation of your symptoms if they are not completely improved.   Return instructions:  Please return to the Emergency Department if you experience worsening symptoms.  Please return for any fever or chills in the setting of your back pain, weakness in the muscles of the legs, numbness in your legs and feet that is new or changing, numbness in the area where you wipe, retention of your urine, loss of bowel or bladder control, or problems with walking. Please return if you have any other emergent concerns.  Additional Information:   Your vital signs today were: BP 123/75 (BP Location: Right Arm)    Pulse 63    Temp 97.7 F (36.5 C) (Oral)    Resp 18    Ht 5\' 9"  (1.753 m)    Wt 87.1 kg (192 lb)    SpO2 98%    BMI 28.35 kg/m  If your blood pressure (BP) was elevated on multiple readings during this visit above 130 for the top number or above 80 for the bottom number, please have this repeated by your primary care provider within one month. --------------  Thank you for  allowing [place] to participate in your care today.
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
## 2018-02-01 NOTE — ED Provider Notes (Signed)
[facility]-EMERGENCY DEPT Provider Note   CSN: [id number] Arrival date & time: 02/01/18  4098     History   Chief Complaint Chief Complaint  Patient presents with  . Back Pain    HPI Ryan Bartlett is a 22 y.o. male.  HPI  Patient is a 22 year old male with a history of asthma, ADHD, bipolar disorder presenting for back pain.  Patient reports he woke up and felt a "shock like sensation" from his mid thoracic region downward.  Patient reports back pain for approximately a month, however it was more severe today and ~10/10.  Patient reports is slightly better on presentation.  Patient denies any neck stiffness or pain, fevers, chills, IVDU history, heavy alcohol use or cancer.  Patient denies any weakness or numbness in extremities, saddle anesthesia, loss of bowel or bladder control, urinary, retention with overflow incontinence.  Patient denies any treatments prior to arrival for back pain.  Patient presents from a shelter, and reports that he has medical follow-up through that service.  Past Medical History:  Diagnosis Date  . ADHD (attention deficit hyperactivity disorder)   . Arthritis   . Asthma   . Bipolar disorder (HCC)   . Depression   . Oppositional defiant disorder   . PTSD (post-traumatic stress disorder)   . Schizoaffective disorder (HCC)   . Unspecified episodic mood disorder     Patient Active Problem List   Diagnosis Date Noted  . Adjustment disorder with depressed mood 11/22/2017  . Suicidal thoughts 11/07/2016  . Bipolar 2 disorder (HCC) 08/08/2016  . Methamphetamine abuse (HCC) 03/08/2016  . Bipolar affect, depressed (HCC) 09/18/2015  . Bipolar affective disorder, currently depressed, mild (HCC) 09/17/2015  . Intentional overdose of drug in tablet form (HCC)   . Depression   . Suicidal ideation   . Cannabis use disorder, moderate, dependence (HCC) 06/06/2015  . Alcohol use disorder, moderate, dependence (HCC) 06/06/2015  .  Cannabis abuse 03/30/2012  . ADHD (attention deficit hyperactivity disorder), combined type 08/28/2011  . Conduct disorder, adolescent onset type 08/28/2011    Past Surgical History:  Procedure Laterality Date  . NO PAST SURGERIES          Home Medications    Prior to Admission medications   Medication Sig Start Date End Date Taking? Authorizing Provider  FLUoxetine (PROZAC) 40 MG capsule Take 40 mg by mouth daily.   Yes [provider]  traZODone (DESYREL) 50 MG tablet Take 1 tablet (50 mg total) by mouth at bedtime as needed for sleep. 11/12/16  Yes Armandina Stammer I, NP  ibuprofen (ADVIL,MOTRIN) 600 MG tablet Take 1 tablet (600 mg total) by mouth every 6 (six) hours as needed. Patient not taking: Reported on 02/01/2018 01/15/18   Elpidio Anis, PA-C    Family History Family History  Problem Relation Age of Onset  . Depression Mother   . Diabetes Other   . Hyperlipidemia Other   . Hypertension Other     Social History Social History   Tobacco Use  . Smoking status: Current Every Day Smoker    Packs/day: 0.50    Years: 0.00    Pack years: 0.00    Types: Cigarettes  . Smokeless tobacco: Never Used  Substance Use Topics  . Alcohol use: Yes    Comment: minimal  . Drug use: Yes    Types: Marijuana     Allergies   Peanut-containing drug products and Lactose intolerance (gi)   Review of Systems Review of Systems  Constitutional: Negative for chills and fever.  Respiratory: Negative for shortness of breath.   Cardiovascular: Negative for chest pain.  Gastrointestinal: Negative for abdominal pain, nausea and vomiting.  Genitourinary: Negative for difficulty urinating.  Musculoskeletal: Positive for back pain. Negative for arthralgias, gait problem, neck pain and neck stiffness.  Skin: Negative for wound.  Allergic/Immunologic: Negative for immunocompromised state.  Neurological: Negative for weakness and numbness.  All other systems reviewed and are  negative.    Physical Exam Updated Vital Signs BP 123/75 (BP Location: Right Arm)   Pulse 63   Temp 97.7 F (36.5 C) (Oral)   Resp 18   Ht 5\' 9"  (1.753 m)   Wt 87.1 kg (192 lb)   SpO2 98%   BMI 28.35 kg/m   Physical Exam  Constitutional: He appears well-developed and well-nourished. No distress.  HENT:  Head: Normocephalic and atraumatic.  Mouth/Throat: Oropharynx is clear and moist.  Eyes: Pupils are equal, round, and reactive to light. Conjunctivae and EOM are normal.  Neck: Normal range of motion. Neck supple.  Cardiovascular: Normal rate, regular rhythm, S1 normal and S2 normal.  No murmur heard. Pulmonary/Chest: Effort normal and breath sounds normal. He has no wheezes. He has no rales.  Abdominal: Soft. He exhibits no distension. There is no tenderness. There is no guarding.  Musculoskeletal:  Spine Exam: Inspection/Palpation: No midline tenderness palpation of cervical, thoracic, or lumbar spine. Strength: 5/5 throughout LE bilaterally (hip flexion/extension, adduction/abduction; knee flexion/extension; foot dorsiflexion/plantarflexion, inversion/eversion; great toe inversion) Sensation: Intact to light touch in proximal and distal LE bilaterally Reflexes: 2+ quadriceps and achilles reflexes Normal and symmetric gait.  Lymphadenopathy:    He has no cervical adenopathy.  Neurological: He is alert.  Cranial nerves grossly intact. Patient moves extremities symmetrically and with good coordination.  Skin: Skin is warm and dry. No rash noted. No erythema.  Psychiatric: He has a normal mood and affect. His behavior is normal. Judgment and thought content normal.  Nursing note and vitals reviewed.    ED Treatments / Results  Labs (all labs ordered are listed, but only abnormal results are displayed) Labs Reviewed - No data to display  EKG None  Radiology No results found.  Procedures Procedures (including critical care time)  Medications Ordered in  ED Medications  methocarbamol (ROBAXIN) tablet 500 mg (500 mg Oral Given 02/01/18 1015)  ibuprofen (ADVIL,MOTRIN) tablet 600 mg (600 mg Oral Given 02/01/18 1014)     Initial Impression / Assessment and Plan / ED Course  I have reviewed the triage vital signs and the nursing notes.  Pertinent labs & imaging results that were available during my care of the patient were reviewed by me and considered in my medical decision making (see chart for details).     Patient denies any concerning symptoms suggestive of cauda equina requiring urgent imaging at this time such as loss of sensation in the lower extremities, lower extremity weakness, loss of bowel or bladder control, saddle anesthesia, urinary retention, fever/chills, IVDU. Exam demonstrated no  weakness today. No preceding injury or trauma to suggest acute fracture. Doubt pelvic or urinary pathology for patient's acute back pain, as patient denies urinary symptoms.  Pain control emergency department muscle relaxants, and patient denying any further pain and ambulating without difficulty emergency department patient given strict return precautions for any symptoms indicating worsening neurologic function in the lower extremities.   Final Clinical Impressions(s) / ED Diagnoses   Final diagnoses:  Acute bilateral thoracic back pain  ED Discharge Orders        Ordered    ibuprofen (ADVIL,MOTRIN) 600 MG tablet  Every 6 hours PRN     02/01/18 1239    acetaminophen (TYLENOL) 325 MG tablet  Every 6 hours PRN     02/01/18 1239       Elisha PonderMurray, Shaleigh Laubscher B, PA-C 02/01/18 1713    Melene PlanFloyd, Dan, DO 02/02/18 0703

## 2018-04-07 ENCOUNTER — Emergency Department (HOSPITAL_COMMUNITY)
Admission: EM | Admit: 2018-04-07 | Discharge: 2018-04-08 | Disposition: A | Discharge status: Other Acute Inpatient Hospital | Payer: Medicaid Other | Attending: Emergency Medicine | Admitting: Emergency Medicine

## 2018-04-07 ENCOUNTER — Other Ambulatory Visit: Payer: Self-pay

## 2018-04-07 ENCOUNTER — Encounter (HOSPITAL_COMMUNITY): Payer: Self-pay | Admitting: Emergency Medicine

## 2018-04-07 DIAGNOSIS — Z046 Encounter for general psychiatric examination, requested by authority: Secondary | ICD-10-CM | POA: Insufficient documentation

## 2018-04-07 DIAGNOSIS — F17210 Nicotine dependence, cigarettes, uncomplicated: Secondary | ICD-10-CM | POA: Insufficient documentation

## 2018-04-07 DIAGNOSIS — R45851 Suicidal ideations: Secondary | ICD-10-CM | POA: Insufficient documentation

## 2018-04-07 DIAGNOSIS — J45909 Unspecified asthma, uncomplicated: Secondary | ICD-10-CM | POA: Insufficient documentation

## 2018-04-07 DIAGNOSIS — R45850 Homicidal ideations: Secondary | ICD-10-CM | POA: Insufficient documentation

## 2018-04-07 DIAGNOSIS — F250 Schizoaffective disorder, bipolar type: Secondary | ICD-10-CM | POA: Insufficient documentation

## 2018-04-07 DIAGNOSIS — Z91010 Allergy to peanuts: Secondary | ICD-10-CM | POA: Insufficient documentation

## 2018-04-07 DIAGNOSIS — Z79899 Other long term (current) drug therapy: Secondary | ICD-10-CM | POA: Insufficient documentation

## 2018-04-07 LAB — ACETAMINOPHEN LEVEL

## 2018-04-07 LAB — COMPREHENSIVE METABOLIC PANEL
ALBUMIN: 3.9 g/dL (ref 3.5–5.0)
ALK PHOS: 56 U/L (ref 38–126)
ALT: 15 U/L (ref 0–44)
ANION GAP: 9 (ref 5–15)
AST: 16 U/L (ref 15–41)
BUN: 11 mg/dL (ref 6–20)
CHLORIDE: 107 mmol/L (ref 98–111)
CO2: 26 mmol/L (ref 22–32)
Calcium: 9.4 mg/dL (ref 8.9–10.3)
Creatinine, Ser: 0.91 mg/dL (ref 0.61–1.24)
GFR calc Af Amer: >60 mL/min (ref >60)
GFR calc non Af Amer: >60 mL/min (ref >60)
GLUCOSE: 84 mg/dL (ref 70–99)
POTASSIUM: 3.4 mmol/L — AB (ref 3.5–5.1)
SODIUM: 142 mmol/L (ref 135–145)
Total Bilirubin: 1 mg/dL (ref 0.3–1.2)
Total Protein: 6.5 g/dL (ref 6.5–8.1)

## 2018-04-07 LAB — CBC
HEMATOCRIT: 43.9 % (ref 39.0–52.0)
HEMOGLOBIN: 14 g/dL (ref 13.0–17.0)
MCH: 28.3 pg (ref 26.0–34.0)
MCHC: 31.9 g/dL (ref 30.0–36.0)
MCV: 88.7 fL (ref 78.0–100.0)
Platelets: 245 10*3/uL (ref 150–400)
RBC: 4.95 MIL/uL (ref 4.22–5.81)
RDW: 13.8 % (ref 11.5–15.5)
WBC: 10.2 10*3/uL (ref 4.0–10.5)

## 2018-04-07 LAB — SALICYLATE LEVEL: Salicylate Lvl: <7 mg/dL (ref 2.8–30.0)

## 2018-04-07 LAB — ETHANOL: Alcohol, Ethyl (B): <10 mg/dL (ref <10)

## 2018-04-07 NOTE — ED Notes (Signed)
Pt changed into paper scrubs. Denim shorts, t shirt, and 1 pair of socks. Security called for wanding.

## 2018-04-07 NOTE — ED Notes (Signed)
Pt's belongings placed in locker #3.  

## 2018-04-07 NOTE — ED Notes (Signed)
No sitters available at this time per staffing.
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
## 2018-04-08 ENCOUNTER — Other Ambulatory Visit: Payer: Self-pay

## 2018-04-08 ENCOUNTER — Encounter (HOSPITAL_COMMUNITY): Payer: Self-pay | Admitting: *Deleted

## 2018-04-08 ENCOUNTER — Inpatient Hospital Stay (HOSPITAL_COMMUNITY)
Admission: AD | Admit: 2018-04-08 | Discharge: 2018-04-11 | DRG: 885 | Disposition: A | Discharge status: Home / Self Care | Payer: Medicaid Other | Source: Intra-hospital | Attending: Psychiatry | Admitting: Psychiatry

## 2018-04-08 DIAGNOSIS — F1290 Cannabis use, unspecified, uncomplicated: Secondary | ICD-10-CM | POA: Diagnosis present

## 2018-04-08 DIAGNOSIS — Z79899 Other long term (current) drug therapy: Secondary | ICD-10-CM

## 2018-04-08 DIAGNOSIS — Z653 Problems related to other legal circumstances: Secondary | ICD-10-CM

## 2018-04-08 DIAGNOSIS — E739 Lactose intolerance, unspecified: Secondary | ICD-10-CM | POA: Diagnosis present

## 2018-04-08 DIAGNOSIS — F902 Attention-deficit hyperactivity disorder, combined type: Secondary | ICD-10-CM | POA: Diagnosis present

## 2018-04-08 DIAGNOSIS — Z590 Homelessness: Secondary | ICD-10-CM | POA: Diagnosis not present

## 2018-04-08 DIAGNOSIS — Z818 Family history of other mental and behavioral disorders: Secondary | ICD-10-CM | POA: Diagnosis not present

## 2018-04-08 DIAGNOSIS — F4321 Adjustment disorder with depressed mood: Secondary | ICD-10-CM | POA: Diagnosis present

## 2018-04-08 DIAGNOSIS — F913 Oppositional defiant disorder: Secondary | ICD-10-CM | POA: Diagnosis present

## 2018-04-08 DIAGNOSIS — Z915 Personal history of self-harm: Secondary | ICD-10-CM

## 2018-04-08 DIAGNOSIS — G4700 Insomnia, unspecified: Secondary | ICD-10-CM | POA: Diagnosis present

## 2018-04-08 DIAGNOSIS — F250 Schizoaffective disorder, bipolar type: Secondary | ICD-10-CM | POA: Diagnosis present

## 2018-04-08 DIAGNOSIS — J45909 Unspecified asthma, uncomplicated: Secondary | ICD-10-CM | POA: Diagnosis present

## 2018-04-08 DIAGNOSIS — Z91010 Allergy to peanuts: Secondary | ICD-10-CM

## 2018-04-08 DIAGNOSIS — F39 Unspecified mood [affective] disorder: Secondary | ICD-10-CM | POA: Diagnosis present

## 2018-04-08 DIAGNOSIS — F17210 Nicotine dependence, cigarettes, uncomplicated: Secondary | ICD-10-CM | POA: Diagnosis present

## 2018-04-08 DIAGNOSIS — F4310 Post-traumatic stress disorder, unspecified: Secondary | ICD-10-CM | POA: Diagnosis present

## 2018-04-08 DIAGNOSIS — R45851 Suicidal ideations: Secondary | ICD-10-CM | POA: Diagnosis present

## 2018-04-08 LAB — RAPID URINE DRUG SCREEN, HOSP PERFORMED
AMPHETAMINES: NOT DETECTED
Barbiturates: NOT DETECTED
Benzodiazepines: NOT DETECTED
COCAINE: NOT DETECTED
OPIATES: NOT DETECTED
Tetrahydrocannabinol: POSITIVE — AB

## 2018-04-08 MED ORDER — LORAZEPAM 1 MG PO TABS
1.0000 mg | ORAL_TABLET | ORAL | Status: DC | PRN
  Administered 2018-04-08: 1 mg via ORAL
  Filled 2018-04-08: qty 1

## 2018-04-08 MED ORDER — TRAZODONE HCL 50 MG PO TABS
50.0000 mg | ORAL_TABLET | Freq: Every evening | ORAL | Status: DC | PRN
  Administered 2018-04-08: 50 mg via ORAL
  Filled 2018-04-08: qty 1

## 2018-04-08 MED ORDER — HYDROXYZINE HCL 25 MG PO TABS
25.0000 mg | ORAL_TABLET | Freq: Four times a day (QID) | ORAL | Status: DC | PRN
  Administered 2018-04-08: 25 mg via ORAL

## 2018-04-08 MED ORDER — ZIPRASIDONE MESYLATE 20 MG IM SOLR: 20.0000 mg | INTRAMUSCULAR | Status: DC | PRN

## 2018-04-08 MED ORDER — ACETAMINOPHEN 325 MG PO TABS: 650.0000 mg | ORAL_TABLET | ORAL | Status: DC | PRN

## 2018-04-08 MED ORDER — NICOTINE POLACRILEX 2 MG MT GUM
2.0000 mg | CHEWING_GUM | OROMUCOSAL | Status: DC | PRN
  Administered 2018-04-10: 2 mg via ORAL
  Filled 2018-04-08: qty 1

## 2018-04-08 MED ORDER — ONDANSETRON HCL 4 MG PO TABS: 4.0000 mg | ORAL_TABLET | Freq: Three times a day (TID) | ORAL | Status: DC | PRN

## 2018-04-08 MED ORDER — LORAZEPAM 1 MG PO TABS: 1.0000 mg | ORAL_TABLET | ORAL | Status: DC | PRN

## 2018-04-08 MED ORDER — HYDROXYZINE HCL 50 MG PO TABS
50.0000 mg | ORAL_TABLET | Freq: Once | ORAL | Status: DC
  Filled 2018-04-08: qty 1

## 2018-04-08 MED ORDER — FLUOXETINE HCL 20 MG PO CAPS
40.0000 mg | ORAL_CAPSULE | Freq: Every day | ORAL | Status: DC
  Administered 2018-04-08: 40 mg via ORAL
  Filled 2018-04-08: qty 2

## 2018-04-08 MED ORDER — IBUPROFEN 600 MG PO TABS
600.0000 mg | ORAL_TABLET | Freq: Four times a day (QID) | ORAL | Status: DC | PRN
  Administered 2018-04-09 – 2018-04-11 (×3): 600 mg via ORAL
  Filled 2018-04-08 (×4): qty 1

## 2018-04-08 MED ORDER — ACETAMINOPHEN 325 MG PO TABS
650.0000 mg | ORAL_TABLET | ORAL | Status: DC | PRN
  Administered 2018-04-08: 650 mg via ORAL
  Filled 2018-04-08: qty 2

## 2018-04-08 MED ORDER — OLANZAPINE 5 MG PO TBDP
10.0000 mg | ORAL_TABLET | Freq: Three times a day (TID) | ORAL | Status: DC | PRN
  Filled 2018-04-08: qty 2

## 2018-04-08 MED ORDER — TRAZODONE HCL 50 MG PO TABS: 50.0000 mg | ORAL_TABLET | Freq: Every evening | ORAL | Status: DC | PRN

## 2018-04-08 MED ORDER — OLANZAPINE 10 MG PO TBDP: 10.0000 mg | ORAL_TABLET | Freq: Three times a day (TID) | ORAL | Status: DC | PRN

## 2018-04-08 MED ORDER — NICOTINE 21 MG/24HR TD PT24
21.0000 mg | MEDICATED_PATCH | Freq: Every day | TRANSDERMAL | Status: DC
  Filled 2018-04-08: qty 1

## 2018-04-08 MED ORDER — NICOTINE 21 MG/24HR TD PT24
21.0000 mg | MEDICATED_PATCH | Freq: Every day | TRANSDERMAL | Status: DC
  Filled 2018-04-08 (×4): qty 1

## 2018-04-08 MED ORDER — FLUOXETINE HCL 20 MG PO CAPS
40.0000 mg | ORAL_CAPSULE | Freq: Every day | ORAL | Status: DC
  Administered 2018-04-09: 40 mg via ORAL
  Filled 2018-04-08 (×3): qty 2

## 2018-04-08 NOTE — ED Notes (Signed)
Pt in shower.  

## 2018-04-08 NOTE — Progress Notes (Signed)
Pt [facility]  [facility], Bed [facility]2 Ryan ConnJason Berry, Ryan Bartlett is the accepting provider.  Dr. Jolyne Loahristopher Bartlett is the attending provider.  Call report to [phone]  [facility] notified.   Pt is IVC .  Pt may be transported by [employer] Pt scheduled  [facility].  Ryan Bartlett, MSW, LCSWA Disposition Clinical Social Work [phone] (cell) [phone] (office)

## 2018-04-08 NOTE — Tx Team (Signed)
Initial Treatment Plan 04/08/2018 9:10 PM Ryan Bartlett [id number]    PATIENT STRESSORS: Marital or family conflict Medication change or noncompliance Substance abuse   PATIENT STRENGTHS: Ability for insight Average or above average intelligence Capable of independent living General fund of knowledge Motivation for treatment/growth   PATIENT IDENTIFIED PROBLEMS: Depression Anxiety Suicidal thoughts "I guess get back on my medications"                     DISCHARGE CRITERIA:  Ability to meet basic life and health needs Improved stabilization in mood, thinking, and/or behavior Verbal commitment to aftercare and medication compliance  PRELIMINARY DISCHARGE PLAN: Attend aftercare/continuing care group  PATIENT/FAMILY INVOLVEMENT: This treatment plan has been presented to and reviewed with the patient, Ryan Bartlett, and/or family member, .  The patient and family have been given the opportunity to ask questions and make suggestions.  Ryan Bartlett, Ryan Bartlett, [place] 04/08/2018, 9:10 PM

## 2018-04-08 NOTE — ED Notes (Addendum)
[facility] [employer] [facility]. All paperwork given to officers. Pt cooperative at this time.

## 2018-04-08 NOTE — ED Notes (Signed)
[facility]. [place], RN [facility].
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
## 2018-04-08 NOTE — ED Notes (Signed)
Pt's ACT person - Tasia CatchingsCraig - office number [phone]- [phone] - came to ED and advised pt had a court date today that he was supposed to transport pt to and needs a note showing pt is in hospital - given as requested by pt. Tasia CatchingsCraig advised pt may call office if d/c'd. Pt requested to call his probation officer but does not know his number. Tasia CatchingsCraig advised he does not have number either.

## 2018-04-08 NOTE — ED Notes (Signed)
[employer] called for transport 

## 2018-04-08 NOTE — ED Notes (Addendum)
Pt expressing having difficulty resting. Ativan given. Pt voiced agreement and understanding w/tx plan. Pt given apple juice and graham crackers as requested.

## 2018-04-08 NOTE — ED Notes (Signed)
IV[facility] papers served - [facility], Copy sent to [facility] Records, original placed in folder for Magistrate, and all 3 sets on clipboard.

## 2018-04-08 NOTE — ED Notes (Signed)
[facility] [employer] to serve papers.

## 2018-04-08 NOTE — ED Notes (Signed)
Patient denies pain and is resting comfortably.  

## 2018-04-08 NOTE — ED Notes (Addendum)
Pt states he has a Engineer, drillingprobation officer - requested for someone to advise him he is here d/t he has a court date today. Pt states he has no place to go and was at his mother's house yesterday when they had a disagreement. Pt aware of need for urine specimen - cup on bedside table. Pt eating breakfast.

## 2018-04-08 NOTE — ED Notes (Signed)
IVC paperwork needs to be re-done d/t Exam and Rec not completed w/in 24 hours of Custody Order. Dr Lockie Molauratolo completed - [facility]. Verified Magistrate received.

## 2018-04-08 NOTE — ED Notes (Signed)
Pt called someone from phone at nurses' desk.
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
## 2018-04-08 NOTE — ED Notes (Signed)
Latisha @ TTS notified that pt ready for evaluation

## 2018-04-09 DIAGNOSIS — F250 Schizoaffective disorder, bipolar type: Principal | ICD-10-CM

## 2018-04-09 DIAGNOSIS — F4310 Post-traumatic stress disorder, unspecified: Secondary | ICD-10-CM

## 2018-04-09 MED ORDER — DIVALPROEX SODIUM 500 MG PO DR TAB
500.0000 mg | DELAYED_RELEASE_TABLET | Freq: Two times a day (BID) | ORAL | Status: DC
  Administered 2018-04-09 – 2018-04-11 (×5): 500 mg via ORAL
  Filled 2018-04-09 (×2): qty 1
  Filled 2018-04-09 (×2): qty 14
  Filled 2018-04-09: qty 1
  Filled 2018-04-09: qty 14
  Filled 2018-04-09: qty 1
  Filled 2018-04-09: qty 14
  Filled 2018-04-09 (×4): qty 1

## 2018-04-09 MED ORDER — ALBUTEROL SULFATE HFA 108 (90 BASE) MCG/ACT IN AERS: 1.0000 | INHALATION_SPRAY | RESPIRATORY_TRACT | Status: DC | PRN

## 2018-04-09 MED ORDER — ARIPIPRAZOLE 10 MG PO TABS
10.0000 mg | ORAL_TABLET | Freq: Every day | ORAL | Status: DC
  Administered 2018-04-09 – 2018-04-11 (×3): 10 mg via ORAL
  Filled 2018-04-09 (×4): qty 1
  Filled 2018-04-09 (×2): qty 7
  Filled 2018-04-09: qty 1

## 2018-04-09 MED ORDER — TRAZODONE HCL 50 MG PO TABS
50.0000 mg | ORAL_TABLET | Freq: Every evening | ORAL | Status: DC | PRN
  Administered 2018-04-09 – 2018-04-10 (×3): 50 mg via ORAL
  Filled 2018-04-09: qty 14
  Filled 2018-04-09: qty 4
  Filled 2018-04-09: qty 1

## 2018-04-09 MED ORDER — HYDROXYZINE HCL 50 MG PO TABS
50.0000 mg | ORAL_TABLET | Freq: Four times a day (QID) | ORAL | Status: DC | PRN
  Administered 2018-04-09 – 2018-04-10 (×2): 50 mg via ORAL
  Filled 2018-04-09: qty 1
  Filled 2018-04-09: qty 4
  Filled 2018-04-09: qty 10

## 2018-04-09 MED ORDER — BUSPIRONE HCL 15 MG PO TABS
15.0000 mg | ORAL_TABLET | Freq: Three times a day (TID) | ORAL | Status: DC
  Administered 2018-04-09 – 2018-04-11 (×7): 15 mg via ORAL
  Filled 2018-04-09 (×2): qty 1
  Filled 2018-04-09: qty 21
  Filled 2018-04-09 (×3): qty 1
  Filled 2018-04-09: qty 21
  Filled 2018-04-09: qty 1
  Filled 2018-04-09 (×2): qty 21
  Filled 2018-04-09 (×2): qty 1
  Filled 2018-04-09: qty 21
  Filled 2018-04-09: qty 1
  Filled 2018-04-09: qty 21
  Filled 2018-04-09: qty 1

## 2018-04-09 MED ORDER — CYCLOBENZAPRINE HCL 10 MG PO TABS: 10.0000 mg | ORAL_TABLET | Freq: Three times a day (TID) | ORAL | Status: DC | PRN

## 2018-04-09 NOTE — H&P (Signed)
Psychiatric Admission Assessment Adult  Patient Identification: Ryan Bartlett MRN:  [id number] Date of Evaluation:  04/09/2018 Chief Complaint:  schizoaffective disorder  Principal Diagnosis: Schizoaffective disorder, bipolar type ([place]) Diagnosis:   Patient Active Problem List   Diagnosis Date Noted  . PTSD (post-traumatic stress disorder) [F43.10] 04/09/2018  . Schizoaffective disorder, bipolar type ([place]) [F25.0] 04/08/2018  . Adjustment disorder with depressed mood [F43.21] 11/22/2017  . Suicidal thoughts [R45.851] 11/07/2016  . Bipolar 2 disorder ([place]) [F31.81] 08/08/2016  . Methamphetamine abuse ([place]) [F15.10] 03/08/2016  . Bipolar affect, depressed ([place]) [F31.30] 09/18/2015  . Bipolar affective disorder, currently depressed, mild ([place]) [F31.31] 09/17/2015  . Intentional overdose of drug in tablet form ([place]) [T50.902A]   . Depression [F32.9]   . Suicidal ideation [R45.851]   . Cannabis use disorder, moderate, dependence ([place]) [F12.20] 06/06/2015  . Alcohol use disorder, moderate, dependence ([place]) [F10.20] 06/06/2015  . Cannabis abuse [F12.10] 03/30/2012  . ADHD (attention deficit hyperactivity disorder), combined type [F90.2] 08/28/2011  . Conduct disorder, adolescent onset type [F91.2] 08/28/2011   History of Present Illness:   Ryan Bartlett is a 22 y/o M with history of schizophrenia, bipolar, PTSD, and ADHD who was admitted under [facility]C from [facility] where he presented brought in by EMS/[facility] after pt had worsening agitation, disorganized behavior, SI, and HI in the context of altercation with his family. Police were contacted by his mother and overheard pt making SI/HI statements, so he was brought to [facility] for evaluation. Pt was medically cleared and then transferred to [facility] for additional treatment and stabilization.   Upon initial evaluation, pt shares, "Me and my sister got into an argument. It was getting heated. My mom gave me a klonopin - which is one of her medications - and  a trazodone, and that's about all I remember. At that point I just remember bright lights and thinking that I don't want to go back to jail." Pt verifies that he has been having SI without plan or intent for about 1 week and he may have made statements about SI during the altercation. He denies HI/AH/V. He reports that his mood has been "hyper" and his energy has been high recently. He denies other symptoms of depression. Pt reports that he has been sleeping minimally -at most 4 hours per night - for about 1 week prior to hospitalization. He also endorses distractibility, irritability, and flight of ideas. He denies symptoms of OCD and PTSD.  He consumes alcohol about twice per month (1 drink per sitting), tobacco 1/2 ppd, and cannabis daily about 5-6 times per day. He also used cocaine about 4-5 days prior to admission but he does not regularly use it. He denies all other recent substance use.   Discussed with patient about treatment options. He reports good adherence to his outpatient medications of depakote DR '250mg'$  TID, prozac '40mg'$  po qDay, and buspar '30mg'$  po TID. We discussed obtaining a depakote level and increasing his dose of depakote as it is likely low. We also discussed discontinuing prozac in case it is contributing to manic symptoms, and instead we will start trial of abilify. We will also reduce dose of buspar. Pt was in agreement with the above plan, and he had no further questions, comments, or concerns.  Associated Signs/Symptoms: Depression Symptoms:  depressed mood, insomnia, difficulty concentrating, anxiety, disturbed sleep, (Hypo) Manic Symptoms:  Distractibility, Elevated Mood, Flight of Ideas, Impulsivity, Irritable Mood, Labiality of Mood, Anxiety Symptoms:  Excessive Worry, Psychotic Symptoms:  NA PTSD Symptoms: NA Total  Time spent with patient: 1 hour  Past Psychiatric History:  -past dx of schizophrenia, PTSD, bipolar - outpatient is Dr. Jake Samples through [facility] - more than 10 inpt admissions [facility] - pt reports more than 10 near- suicide attempts via hanging self, drowning, and cut throat but when clarifying he states that he has always been stopped before taking action.   Is the patient at risk to self? Yes.    Has the patient been a risk to self in the past 6 months? Yes.    Has the patient been a risk to self within the distant past? Yes.    Is the patient a risk to others? Yes.    Has the patient been a risk to others in the past 6 months? Yes.    Has the patient been a risk to others within the distant past? Yes.     Prior Inpatient Therapy:   Prior Outpatient Therapy:    Alcohol Screening: 1. How often do you have a drink containing alcohol?: 2 to 4 times a month 2. How many drinks containing alcohol do you have on a typical day when you are drinking?: 3 or 4 3. How often do you have six or more drinks on one occasion?: Less than monthly AUDIT-C Score: 4 4. How often during the last year have you found that you were not able to stop drinking once you had started?: Never 5. How often during the last year have you failed to do what was normally expected from you becasue of drinking?: Never 6. How often during the last year have you needed a first drink in the morning to get yourself going after a heavy drinking session?: Never 7. How often during the last year have you had a feeling of guilt of remorse after drinking?: Never 8. How often during the last year have you been unable to remember what happened the night before because you had been drinking?: Never 9. Have you or someone else been injured as a result of your drinking?: No 10. Has a relative or friend or a doctor or another health worker been concerned about your drinking or suggested you cut down?: No Alcohol Use Disorder Identification Test Final Score (AUDIT): 4 Intervention/Follow-up: AUDIT Score <7 follow-up not indicated Substance Abuse History in the  last 12 months:  Yes.   Consequences of Substance Abuse: Medical Consequences:  worsened mood and psychotic symptoms Previous Psychotropic Medications: Yes  Psychological Evaluations: Yes  Past Medical History:  Past Medical History:  Diagnosis Date  . ADHD (attention deficit hyperactivity disorder)   . Arthritis   . Asthma   . Bipolar disorder ([place])   . Depression   . Oppositional defiant disorder   . PTSD (post-traumatic stress disorder)   . Schizoaffective disorder ([place])   . Unspecified episodic mood disorder     Past Surgical History:  Procedure Laterality Date  . NO PAST SURGERIES     Family History:  Family History  Problem Relation Age of Onset  . Depression Mother   . Diabetes Other   . Hyperlipidemia Other   . Hypertension Other    Family Psychiatric  History: mother hx of depression.  Tobacco Screening: Have you used any form of tobacco in the last 30 days? (Cigarettes, Smokeless Tobacco, Cigars, and/or Pipes): Yes Tobacco use, Select all that apply: 5 or more cigarettes per day Are you interested in Tobacco Cessation Medications?: Yes, will notify MD for an  order Counseled patient on smoking cessation including recognizing danger situations, developing coping skills and basic information about quitting provided: Refused/Declined practical counseling Social History: Pt is recently homeless after being asked to leave his hotel room. He completed 11th grade. He last worked 1 year ago in [employer]. He has no children. He is on probation and parole.  Social History   Substance and Sexual Activity  Alcohol Use Yes   Comment: minimal     Social History   Substance and Sexual Activity  Drug Use Yes  . Types: Marijuana    Additional Social History:                           Allergies:   Allergies  Allergen Reactions  . Peanut-Containing Drug Products Anaphylaxis  . Lactose Intolerance (Gi) Diarrhea and Nausea And Vomiting   Lab Results:   Results for orders placed or performed during the hospital encounter of 04/07/18 (from the past 48 hour(s))  Comprehensive metabolic panel     Status: Abnormal   Collection Time: 04/07/18 10:00 PM  Result Value Ref Range   Sodium 142 135 - 145 mmol/L   Potassium 3.4 (L) 3.5 - 5.1 mmol/L   Chloride 107 98 - 111 mmol/L   CO2 26 22 - 32 mmol/L   Glucose, Bld 84 70 - 99 mg/dL   BUN 11 6 - 20 mg/dL   Creatinine, Ser 0.91 0.61 - 1.24 mg/dL   Calcium 9.4 8.9 - 10.3 mg/dL   Total Protein 6.5 6.5 - 8.1 g/dL   Albumin 3.9 3.5 - 5.0 g/dL   AST 16 15 - 41 U/L   ALT 15 0 - 44 U/L   Alkaline Phosphatase 56 38 - 126 U/L   Total Bilirubin 1.0 0.3 - 1.2 mg/dL   GFR calc non Af Amer >60 >60 mL/min   GFR calc Af Amer >60 >60 mL/min    Comment: (NOTE) The eGFR has been calculated using the CKD EPI equation. This calculation has not been validated in all clinical situations. eGFR's persistently <60 mL/min signify possible Chronic Kidney Disease.    Anion gap 9 5 - 15    Comment: Performed at [facility] [address], [place], [place] [address]  Ethanol     Status: None   Collection Time: 04/07/18 10:00 PM  Result Value Ref Range   Alcohol, Ethyl (B) <10 <10 mg/dL    Comment: (NOTE) Lowest detectable limit for serum alcohol is 10 mg/dL. For medical purposes only. Performed at [facility], [place] [address], [place], [place] [address]   Salicylate level     Status: None   Collection Time: 04/07/18 10:00 PM  Result Value Ref Range   Salicylate Lvl <2.5 2.8 - 30.0 mg/dL    Comment: Performed at [facility] [address], [place], [place] [address]  Acetaminophen level     Status: Abnormal   Collection Time: 04/07/18 10:00 PM  Result Value Ref Range   Acetaminophen (Tylenol), Serum <10 (L) 10 - 30 ug/mL    Comment: (NOTE) Therapeutic concentrations vary significantly. A range of 10-30 ug/mL  may be an effective concentration for many patients. However, some  are best  treated at concentrations outside of this range. Acetaminophen concentrations >150 ug/mL at 4 hours after ingestion  and >50 ug/mL at 12 hours after ingestion are often associated with  toxic reactions. Performed at [facility], [address] [place], [place] [address]   cbc     Status: None   Collection Time: 04/07/18 10:00 PM  Result Value Ref Range   WBC 10.2 4.0 - 10.5 K/uL   RBC 4.95 4.22 - 5.81 MIL/uL   Hemoglobin 14.0 13.0 - 17.0 g/dL   HCT 43.9 39.0 - 52.0 %   MCV 88.7 78.0 - 100.0 fL   MCH 28.3 26.0 - 34.0 pg   MCHC 31.9 30.0 - 36.0 g/dL   RDW 13.8 11.5 - 15.5 %   Platelets 245 150 - 400 K/uL    Comment: Performed at [facility] [address] [place], [place] [address]  Rapid urine drug screen (hospital performed)     Status: Abnormal   Collection Time: 04/08/18 10:10 AM  Result Value Ref Range   Opiates NONE DETECTED NONE DETECTED   Cocaine NONE DETECTED NONE DETECTED   Benzodiazepines NONE DETECTED NONE DETECTED   Amphetamines NONE DETECTED NONE DETECTED   Tetrahydrocannabinol POSITIVE (A) NONE DETECTED   Barbiturates NONE DETECTED NONE DETECTED    Comment: (NOTE) DRUG SCREEN FOR MEDICAL PURPOSES ONLY.  IF CONFIRMATION IS NEEDED FOR ANY PURPOSE, NOTIFY LAB WITHIN 5 DAYS. LOWEST DETECTABLE LIMITS FOR URINE DRUG SCREEN Drug Class                     Cutoff (ng/mL) Amphetamine and metabolites    1000 Barbiturate and metabolites    200 Benzodiazepine                 093 Tricyclics and metabolites     300 Opiates and metabolites        300 Cocaine and metabolites        300 THC                            50 Performed at [facility], [place] [address] [place], [place] [address]     Blood Alcohol level:  Lab Results  Component Value Date   ETH <10 04/07/2018   ETH <10 93/71/6967    Metabolic Disorder Labs:  Lab Results  Component Value Date   HGBA1C 5.6 08/11/2016   MPG 114 08/11/2016   MPG 117 08/10/2016   Lab Results   Component Value Date   PROLACTIN 65.2 (H) 08/09/2016   Lab Results  Component Value Date   CHOL 181 08/11/2016   TRIG 116 08/11/2016   HDL 43 08/11/2016   CHOLHDL 4.2 08/11/2016   VLDL 23 08/11/2016   LDLCALC 115 (H) 08/11/2016   LDLCALC 126 (H) 08/10/2016    Current Medications: Current Facility-Administered Medications  Medication Dose Route Frequency Provider Last Rate Last Dose  . albuterol (PROVENTIL HFA;VENTOLIN HFA) 108 (90 Base) MCG/ACT inhaler 1-2 puff  1-2 puff Inhalation Q4H PRN Pennelope Bracken, MD      . ARIPiprazole (ABILIFY) tablet 10 mg  10 mg Oral Daily Pennelope Bracken, MD   10 mg at 04/09/18 1130  . busPIRone (BUSPAR) tablet 15 mg  15 mg Oral TID Pennelope Bracken, MD   15 mg at 04/09/18 1129  . cyclobenzaprine (FLEXERIL) tablet 10 mg  10 mg Oral TID PRN Pennelope Bracken, MD      . divalproex (DEPAKOTE) DR tablet 500 mg  500 mg Oral Q12H Pennelope Bracken, MD   500 mg at 04/09/18 1129  . hydrOXYzine (ATARAX/VISTARIL) tablet 50 mg  50 mg  Oral Q6H PRN Pennelope Bracken, MD      . ibuprofen (ADVIL,MOTRIN) tablet 600 mg  600 mg Oral Q6H PRN Lindon Romp A, NP      . OLANZapine zydis (ZYPREXA) disintegrating tablet 10 mg  10 mg Oral Q8H PRN Rankin, Shuvon B, NP       And  . LORazepam (ATIVAN) tablet 1 mg  1 mg Oral PRN Rankin, Shuvon B, NP       And  . ziprasidone (GEODON) injection 20 mg  20 mg Intramuscular PRN Rankin, Shuvon B, NP      . nicotine (NICODERM CQ - dosed in mg/24 hours) patch 21 mg  21 mg Transdermal Daily Rankin, Shuvon B, NP      . nicotine polacrilex (NICORETTE) gum 2 mg  2 mg Oral PRN Lindon Romp A, NP      . traZODone (DESYREL) tablet 50 mg  50 mg Oral QHS PRN,MR X 1 Oveta Idris, Randa Ngo, MD       PTA Medications: Medications Prior to Admission  Medication Sig Dispense Refill Last Dose  . acetaminophen (TYLENOL) 325 MG tablet Take 2 tablets (650 mg total) by mouth every 6 (six) hours as needed.  56 tablet 0 unk at prn  . albuterol (PROVENTIL HFA;VENTOLIN HFA) 108 (90 Base) MCG/ACT inhaler Inhale 2 puffs into the lungs every 4 (four) hours as needed for wheezing or shortness of breath.   unk at prn  . busPIRone (BUSPAR) 30 MG tablet Take 30 mg by mouth 3 (three) times daily.   unk  . cyclobenzaprine (FLEXERIL) 10 MG tablet Take 10 mg by mouth 3 (three) times daily as needed for muscle spasms.   unk  . divalproex (DEPAKOTE ER) 250 MG 24 hr tablet Take 250 mg by mouth 3 (three) times daily.   unk  . FLUoxetine (PROZAC) 20 MG capsule Take 20 mg by mouth daily.    unk  . ibuprofen (ADVIL,MOTRIN) 600 MG tablet Take 1 tablet (600 mg total) by mouth every 6 (six) hours as needed. 30 tablet 0 unk at prn  . traZODone (DESYREL) 150 MG tablet Take 150-300 mg by mouth See admin instructions. Take one or two tablets at bedtime   unk  . traZODone (DESYREL) 50 MG tablet Take 1 tablet (50 mg total) by mouth at bedtime as needed for sleep. (Patient not taking: Reported on 04/08/2018) 30 tablet 0 Not Taking at Unknown time    Musculoskeletal: Strength & Muscle Tone: within normal limits Gait & Station: normal Patient leans: N/A  Psychiatric Specialty Exam: Physical Exam  Nursing note and vitals reviewed.   Review of Systems  Constitutional: Negative for chills and fever.  Respiratory: Negative for cough and shortness of breath.   Cardiovascular: Negative for chest pain.  Gastrointestinal: Negative for abdominal pain, heartburn, nausea and vomiting.  Psychiatric/Behavioral: Positive for depression, substance abuse and suicidal ideas. Negative for hallucinations. The patient is nervous/anxious and has insomnia.     Blood pressure 125/83, pulse 73, temperature 97.9 F (36.6 C), temperature source Oral, resp. rate 18, height '5\' 9"'$  (1.753 m), weight 85.7 kg.Body mass index is 27.91 kg/m.  General Appearance: Casual and Fairly Groomed  Eye Contact:  Good  Speech:  Clear and Coherent and Normal Rate   Volume:  Normal  Mood:  Euthymic  Affect:  Appropriate, Congruent and Constricted  Thought Process:  Coherent and Goal Directed  Orientation:  Full (Time, Place, and Person)  Thought Content:  Logical  Suicidal Thoughts:  Yes.  without intent/plan  Homicidal Thoughts:  No  Memory:  Immediate;   Fair Recent;   Fair Remote;   Fair  Judgement:  Fair  Insight:  Fair  Psychomotor Activity:  Normal  Concentration:  Concentration: Fair  Recall:  [employer] of Knowledge:  Fair  Language:  Fair  Akathisia:  No  Handed:    AIMS (if indicated):     Assets:  Resilience Social Support  ADL's:  Intact  Cognition:  WNL  Sleep:  Number of Hours: 6.75   Treatment Plan Summary: Daily contact with patient to assess and evaluate symptoms and progress in treatment and Medication management  Observation Level/Precautions:  15 minute checks  Laboratory:  CBC Chemistry Profile HbAIC UDS UA, depakote level  Psychotherapy:  Encourage participation in groups and therapeutic milieu   Medications:  Change depakote DR '250mg'$  po TID to depakote DR '500mg'$  po BID. DC prozac. Change buspar '30mg'$  po TID to buspar '15mg'$  po TID. Start abilify '10mg'$  po qDay. Continue other home medications and PRN meds as currently ordered.  Consultations:    Discharge Concerns:    Estimated LOS: 5-7 days  Other:     Physician Treatment Plan for Primary Diagnosis: Schizoaffective disorder, bipolar type ([place]) Long Term Goal(s): Improvement in symptoms so as ready for discharge  Short Term Goals: Ability to identify and develop effective coping behaviors will improve  Physician Treatment Plan for Secondary Diagnosis: Principal Problem:   Schizoaffective disorder, bipolar type ([place]) Active Problems:   PTSD (post-traumatic stress disorder)  Long Term Goal(s): Improvement in symptoms so as ready for discharge  Short Term Goals: Ability to demonstrate self-control will improve  I certify that inpatient services furnished  can reasonably be expected to improve the patient's condition.    Pennelope Bracken, MD 8/21/20193:22 PM

## 2018-04-09 NOTE — Progress Notes (Signed)
Adult Psychoeducational Group Note  Date:  04/09/2018 Time:  9:19 PM  Group Topic/Focus:  Wrap-Up Group:   The focus of this group is to help patients review their daily goal of treatment and discuss progress on daily workbooks.  Participation Level:  Active  Participation Quality:  Appropriate  Affect:  Appropriate  Cognitive:  Appropriate  Insight: Appropriate  Engagement in Group:  Engaged  Modes of Intervention:  Discussion  Additional Comments:  The patient expressed that he rates today a 7.The patient also said that he attended groups and his goal is to get more sleep.  Octavio Mannshigpen, Lerlene Treadwell Lee 04/09/2018, 9:19 PM

## 2018-04-09 NOTE — Progress Notes (Signed)
Recreation Therapy Notes  Date: 8.21.19 Time: 1000 Location: 500 Hall Dayroom  Group Topic: Communication  Goal Area(s) Addresses:  Patient will effectively communicate with peers in group.  Patient will verbalize benefit of healthy communication. Patient will verbalize positive effect of healthy communication on post d/c goals.  Patient will identify communication techniques that made activity effective for group.   Intervention: Paper, pencils, geometrical shapes  Activity: Geometrical Designs.  Patients were split into groups of 2.  One person was the speaker and the other the listener.  The speaker had to describe the picture they were given to their partner.  Their partner would then draw the picture that was being described.  The listener was no allowed to ask any questions during the activity.  Education: Communication, Discharge Planning  Education Outcome: Acknowledges understanding/In group clarification offered/Needs additional education.   Clinical Observations/Feedback:  Pt did not attend group.    Toussaint Golson, LRT/CTRS         Ryan Bartlett A 04/09/2018 12:02 PM 

## 2018-04-09 NOTE — Progress Notes (Signed)
Patient denies SI, HI and AVH this shift.  Patient was compliant with medications and had no incidents of behavioral dyscontrol.  Patient attended groups and engaged in unit activities.   Assess patient for safety, offer medications as prescribed, engage patient in 1:1 staff talks.   Patient able to contract for safety. Continue to monitor as planned.  

## 2018-04-09 NOTE — [facility] Group Notes (Signed)
[name] Group Therapy Note  04/09/2018 1:15pm    Type of Therapy and Topic:  Group Therapy:  Who Am I?  Self Esteem, Self-Actualization and Understanding Self    Participation Level:  Active  Description of Group:    In this group patients will be asked to explore values, beliefs, truths, and morals as they relate to personal self.  Patients will be guided to discuss their thoughts, feelings, and behaviors related to what they identify as important to their true self. Patients will process together how values, beliefs and truths are connected to specific choices patients make every day. Each patient will be challenged to identify changes that they are motivated to make in order to improve self-esteem and self-actualization. This group will be process-oriented, with patients participating in exploration of their own experiences, giving and receiving support, and processing challenge from other group members.   Therapeutic Goals: 1. Patient will identify false beliefs that currently interfere with their self-esteem.  2. Patient will identify feelings, thought process, and behaviors related to self and will become aware of the uniqueness of themselves and of others.  3. Patient will be able to identify and verbalize values, morals, and beliefs as they relate to self. 4. Patient will begin to learn how to build self-esteem/self-awareness by expressing what is important and unique to them personally.   Summary of Patient Progress   In and out multiple times, jumped right into discussionb when present, but clearly is distracted by what is happening outside of group room, mainly with staff.  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   Therapeutic Modalities:   Cognitive Behavioral Therapy Solution Focused Therapy Motivational Interviewing Brief Therapy   Ida RogueRodney B Laporshia Hogen, [name] 04/09/2018 11:45 AM

## 2018-04-09 NOTE — BHH Suicide Risk Assessment (Signed)
[facility]   Nursing information obtained from:  Patient Demographic factors:  Male, Adolescent or young adult, Low socioeconomic status, Unemployed Current Mental Status:  Self-harm thoughts Loss Factors:  Legal issues, Financial problems / change in socioeconomic status Historical Factors:  Prior suicide attempts, Family history of mental illness or substance abuse, Impulsivity Risk Reduction Factors:  Positive coping skills or problem solving skills  Total Time spent with patient: 1 hour Principal Problem: Schizoaffective disorder, bipolar type (HCC) Diagnosis:   Patient Active Problem List   Diagnosis Date Noted  . PTSD (post-traumatic stress disorder) [F43.10] 04/09/2018  . Schizoaffective disorder, bipolar type (HCC) [F25.0] 04/08/2018  . Adjustment disorder with depressed mood [F43.21] 11/22/2017  . Suicidal thoughts [R45.851] 11/07/2016  . Bipolar 2 disorder (HCC) [F31.81] 08/08/2016  . Methamphetamine abuse (HCC) [F15.10] 03/08/2016  . Bipolar affect, depressed (HCC) [F31.30] 09/18/2015  . Bipolar affective disorder, currently depressed, mild (HCC) [F31.31] 09/17/2015  . Intentional overdose of drug in tablet form (HCC) [T50.902A]   . Depression [F32.9]   . Suicidal ideation [R45.851]   . Cannabis use disorder, moderate, dependence (HCC) [F12.20] 06/06/2015  . Alcohol use disorder, moderate, dependence (HCC) [F10.20] 06/06/2015  . Cannabis abuse [F12.10] 03/30/2012  . ADHD (attention deficit hyperactivity disorder), combined type [F90.2] 08/28/2011  . Conduct disorder, adolescent onset type [F91.2] 08/28/2011   Subjective Data: see H&P  Continued Clinical Symptoms:  Alcohol Use Disorder Identification Test Final Score (AUDIT): 4 The "Alcohol Use Disorders Identification Test", Guidelines for Use in Primary Care, Second Edition.  World Science writerHealth Organization [facility](WHO). Score between 0-7:  no or low risk or alcohol related problems. Score between 8-15:   moderate risk of alcohol related problems. Score between 16-19:  high risk of alcohol related problems. Score 20 or above:  warrants further diagnostic evaluation for alcohol dependence and treatment.    Psychiatric Specialty Exam: Physical Exam  Nursing note and vitals reviewed.     Blood pressure 125/83, pulse 73, temperature 97.9 F (36.6 C), temperature source Oral, resp. rate 18, height 5\' 9"  (1.753 m), weight 85.7 kg.Body mass index is 27.91 kg/m.    COGNITIVE FEATURES THAT CONTRIBUTE TO RISK:  None    SUICIDE RISK:   Moderate:  Frequent suicidal ideation with limited intensity, and duration, some specificity in terms of plans, no associated intent, good self-control, limited dysphoria/symptomatology, some risk factors present, and identifiable protective factors, including available and accessible social support.  PLAN OF CARE: See H&P for details  I certify that inpatient services furnished can reasonably be expected to improve the patient's condition.   Micheal Likenshristopher T Jameela Michna, MD 04/09/2018, 3:44 PM

## 2018-04-09 NOTE — Tx Team (Signed)
Interdisciplinary Treatment and Diagnostic Plan Update  04/09/2018 Time of Session: 3:29 PM  Ryan Bartlett MRN: [id number]  Principal Diagnosis: Schizoaffective disorder, bipolar type [facility])  Secondary Diagnoses: Principal Problem:   Schizoaffective disorder, bipolar type ([place]) Active Problems:   PTSD (post-traumatic stress disorder)   Current Medications:  Current Facility-Administered Medications  Medication Dose Route Frequency Provider Last Rate Last Dose  . albuterol (PROVENTIL HFA;VENTOLIN HFA) 108 (90 Base) MCG/ACT inhaler 1-2 puff  1-2 puff Inhalation Q4H PRN Pennelope Bracken, MD      . ARIPiprazole (ABILIFY) tablet 10 mg  10 mg Oral Daily Pennelope Bracken, MD   10 mg at 04/09/18 1130  . busPIRone (BUSPAR) tablet 15 mg  15 mg Oral TID Pennelope Bracken, MD   15 mg at 04/09/18 1129  . cyclobenzaprine (FLEXERIL) tablet 10 mg  10 mg Oral TID PRN Pennelope Bracken, MD      . divalproex (DEPAKOTE) DR tablet 500 mg  500 mg Oral Q12H Pennelope Bracken, MD   500 mg at 04/09/18 1129  . hydrOXYzine (ATARAX/VISTARIL) tablet 50 mg  50 mg Oral Q6H PRN Pennelope Bracken, MD      . ibuprofen (ADVIL,MOTRIN) tablet 600 mg  600 mg Oral Q6H PRN Lindon Romp A, NP      . OLANZapine zydis (ZYPREXA) disintegrating tablet 10 mg  10 mg Oral Q8H PRN Rankin, Shuvon B, NP       And  . LORazepam (ATIVAN) tablet 1 mg  1 mg Oral PRN Rankin, Shuvon B, NP       And  . ziprasidone (GEODON) injection 20 mg  20 mg Intramuscular PRN Rankin, Shuvon B, NP      . nicotine (NICODERM CQ - dosed in mg/24 hours) patch 21 mg  21 mg Transdermal Daily Rankin, Shuvon B, NP      . nicotine polacrilex (NICORETTE) gum 2 mg  2 mg Oral PRN Lindon Romp A, NP      . traZODone (DESYREL) tablet 50 mg  50 mg Oral QHS PRN,MR X 1 [place], Randa Ngo, MD        PTA Medications: Medications Prior to Admission  Medication Sig Dispense Refill Last Dose  . acetaminophen (TYLENOL) 325  MG tablet Take 2 tablets (650 mg total) by mouth every 6 (six) hours as needed. 56 tablet 0 unk at prn  . albuterol (PROVENTIL HFA;VENTOLIN HFA) 108 (90 Base) MCG/ACT inhaler Inhale 2 puffs into the lungs every 4 (four) hours as needed for wheezing or shortness of breath.   unk at prn  . busPIRone (BUSPAR) 30 MG tablet Take 30 mg by mouth 3 (three) times daily.   unk  . cyclobenzaprine (FLEXERIL) 10 MG tablet Take 10 mg by mouth 3 (three) times daily as needed for muscle spasms.   unk  . divalproex (DEPAKOTE ER) 250 MG 24 hr tablet Take 250 mg by mouth 3 (three) times daily.   unk  . FLUoxetine (PROZAC) 20 MG capsule Take 20 mg by mouth daily.    unk  . ibuprofen (ADVIL,MOTRIN) 600 MG tablet Take 1 tablet (600 mg total) by mouth every 6 (six) hours as needed. 30 tablet 0 unk at prn  . traZODone (DESYREL) 150 MG tablet Take 150-300 mg by mouth See admin instructions. Take one or two tablets at bedtime   unk  . traZODone (DESYREL) 50 MG tablet Take 1 tablet (50 mg total) by mouth at bedtime as needed for sleep. (Patient not taking:  Reported on 04/08/2018) 30 tablet 0 Not Taking at Unknown time    Patient Stressors: Marital or family conflict Medication change or noncompliance Substance abuse  Patient Strengths: Ability for insight Average or above average intelligence Capable of independent living General fund of knowledge Motivation for treatment/growth  Treatment Modalities: Medication Management, Group therapy, Case management,  1 to 1 session with clinician, Psychoeducation, Recreational therapy.   Physician Treatment Plan for Primary Diagnosis: Schizoaffective disorder, bipolar type ([place]) Long Term Goal(s): Improvement in symptoms so as ready for discharge  Short Term Goals:    Medication Management: Evaluate patient's response, side effects, and tolerance of medication regimen.  Therapeutic Interventions: 1 to 1 sessions, Unit Group sessions and Medication  administration.  Evaluation of Outcomes: Progressing  Physician Treatment Plan for Secondary Diagnosis: Principal Problem:   Schizoaffective disorder, bipolar type ([place]) Active Problems:   PTSD (post-traumatic stress disorder)   Long Term Goal(s): Improvement in symptoms so as ready for discharge  Short Term Goals:    Medication Management: Evaluate patient's response, side effects, and tolerance of medication regimen.  Therapeutic Interventions: 1 to 1 sessions, Unit Group sessions and Medication administration.  Evaluation of Outcomes: Progressing   RN Treatment Plan for Primary Diagnosis: Schizoaffective disorder, bipolar type ([place]) Long Term Goal(s): Knowledge of disease and therapeutic regimen to maintain health will improve  Short Term Goals: Ability to identify and develop effective coping behaviors will improve and Compliance with prescribed medications will improve  Medication Management: RN will administer medications as ordered by provider, will assess and evaluate patient's response and provide education to patient for prescribed medication. RN will report any adverse and/or side effects to prescribing provider.  Therapeutic Interventions: 1 on 1 counseling sessions, Psychoeducation, Medication administration, Evaluate responses to treatment, Monitor vital signs and CBGs as ordered, Perform/monitor CIWA, COWS, AIMS and Fall Risk screenings as ordered, Perform wound care treatments as ordered.  Evaluation of Outcomes: Progressing   [name] Treatment Plan for Primary Diagnosis: Schizoaffective disorder, bipolar type ([place]) Long Term Goal(s): Safe transition to appropriate next level of care at discharge, Engage patient in therapeutic group addressing interpersonal concerns.  Short Term Goals: Engage patient in aftercare planning with referrals and resources  Therapeutic Interventions: Assess for all discharge needs, 1 to 1 time with Social worker, Explore available  resources and support systems, Assess for adequacy in community support network, Educate family and significant other(s) on suicide prevention, Complete Psychosocial Assessment, Interpersonal group therapy.  Evaluation of Outcomes: Met  Return home, follow up [facility]   Progress in Treatment: Attending groups: In and out, per usual Participating in groups: When present Taking medication as prescribed: Yes Toleration medication: Yes, no side effects reported at this time Family/Significant other contact made: Yes ACT team Patient understands diagnosis: Yes AEB asking for help getting back on meds Discussing patient identified problems/goals with staff: Yes Medical problems stabilized or resolved: Yes Denies suicidal/homicidal ideation: Yes Issues/concerns per patient self-inventory: None Other: N/A  New problem(s) identified: None identified at this time.   New Short Term/Long Term Goal(s): "I just need sleep.  I'm pushing pretty much the last 3 days.  Getting back on my usual meds, that are custom fit by Dr Sheppard Evens."   Discharge Plan or Barriers:   Reason for Continuation of Hospitalization: Erratic behavior Easily agitated, angry, aggressiver Medication stabilization   Estimated Length of Stay: 8/26  Attendees: Patient: Ryan Bartlett 04/09/2018  3:29 PM  Physician: Maris Berger, MD 04/09/2018  3:29 PM  Nursing: Vladimir Faster  Cletus Gash, RN 04/09/2018  3:29 PM  RN Care Manager: Lars Pinks, RN 04/09/2018  3:29 PM  Social Worker: Ripley Fraise 04/09/2018  3:29 PM  Recreational Therapist: Winfield Cunas 04/09/2018  3:29 PM  Other: Norberto Sorenson 04/09/2018  3:29 PM  Other:  04/09/2018  3:29 PM    Scribe for Treatment Team:  Roque Lias [name] 04/09/2018 3:29 PM

## 2018-04-09 NOTE — Progress Notes (Signed)
Nursing Progress Note: 7p-7a D: Pt currently presents with a anxious/animated affect and behavior. Pt states "My day was boring. There is nothing to do here. It's not even my fault that I'm here. I wish someone would just leave the door open." Interacting appropriately with the milieu. Pt reports good sleep during the previous night with current medication regimen. Pt did attend wrap-up group.  A: Pt provided with medications per providers orders. Pt's labs and vitals were monitored throughout the night. Pt supported emotionally and encouraged to express concerns and questions. Pt educated on medications.  R: Pt's safety ensured with 15 minute and environmental checks. Pt currently denies SI, HI, and AVH. Pt verbally contracts to seek staff if SI,HI, or AVH occurs and to consult with staff before acting on any harmful thoughts. Will continue to monitor.

## 2018-04-09 NOTE — BHH Counselor (Signed)
Adult Comprehensive Assessment  Patient ID: Ryan Bartlett, male   DOB: July 21, 1996, 22 y.o.   MRN: [id number]  Information Source: Information source: Patient  Current Stressors:  Patient states their primary concerns and needs for treatment are:: "I need to catch up on sleep." Patient states their goals for this hospitilization and ongoing recovery are:: "I guess to get back on meds." Educational / Learning stressors: 11th grade education Employment / Job issues: Disability Family Relationships: Mother is payee, and he reports it is going Building control surveyorK  Financial / Lack of resources (include bankruptcy): Fixed income  Housing / Lack of housing: Living from pillar to post right now  Physical health (include injuries &life threatening diseases): None reported  Social relationships: None reported  Substance abuse: THC use Bereavement / Loss: None reported  Living/Environment/Situation: Living Arrangements: Homeless Living conditions (as described by patient or guardian):Temporary How long has patient lived in current situation?: Couple of weeks.  States he was [facility] housing in a hotel until a couple of weeks ago, when he broke the rules by having a girl theres, so was kicked out. Is now waiting on more permanent housing through the same agency What is atmosphere in current home: Temporary  Family History: Marital status: Single Are you sexually active?: Yes What is your sexual orientation?: Straight Has your sexual activity been affected by drugs, alcohol, medication, or emotional stress?: No Does patient have children?: No  Childhood History: By whom was/is the patient raised?: Mother Additional childhood history information: Pt describes his childhood as "alright" Description of patient's relationship with caregiver when they were a child: it was "alright"; we had our ups and downs, especially when she started dating Patient's description of current relationship with people who  raised him/her: Pt states he has a "pretty good" relationship with his mother currently  How were you disciplined when you got in trouble as a child/adolescent?: Punishments, spankings  Does patient have siblings?: Yes Number of Siblings: 3 Description of patient's current relationship with siblings: very close with younger brother, pt sometimes "butts heads" wit his sisters  Did patient suffer any verbal/emotional/physical/sexual abuse as a child?: No Did patient suffer from severe childhood neglect?: No Has patient ever been sexually abused/assaulted/raped as an adolescent or adult?: No Was the patient ever a victim of a crime or a disaster?: No Witnessed domestic violence?: No Has patient been effected by domestic violence as an adult?: No  Education: Highest grade of school patient has completed: 11th Currently a student?: No Name of school: n/a Learning disability?: Yes What learning problems does patient have?: ADHD  Employment/Work Situation: Employment situation: Disability for a couple years for mental health What is the longest time patient has a held a job?: 3 mo Where was the patient employed at that time?: [employer]  Has patient ever been in the [employer] and Companymilitary?: No Has patient ever served in combat?: No Did You Receive Any Psychiatric Treatment/Services While in Equities traderthe Military?: No Are There Guns or Other Weapons in Your Home?: No   Financial Resources: Surveyor, quantityinancial resources: Writereceives SSI, MCD Mother is payee  Alcohol/Substance Abuse: What has been your use of drugs/alcohol within the last 12 months?:Cannabis regualrly  If attempted suicide, did drugs/alcohol play a role in this?: No Alcohol/Substance Abuse Treatment Hx: Denies past history Has alcohol/substance abuse ever caused legal problems?: No  Social Support System: Patient's Community Support System: Good Describe Community Support System: Mom, grandma, grandfather Type of  faith/religion: Buddhist  How does patient's faith help to  cope with current illness?: "I just sit and meditate and think about how to come up with a better plan than what I'm going through"  Strengths/Needs:   What is the patient's perception of their strengths?: "People person" Patient states they can use these personal strengths during their treatment to contribute to their recovery: "I'm a good sleeper.  I will catch up on my sleep." Patient states these barriers may affect/interfere with their treatment: None Patient states these barriers may affect their return to the community: None Other important information patient would like considered in planning for their treatment: None  Discharge Plan:   Currently receiving community mental health services: Yes (From Whom)([facility]) Patient states concerns and preferences for aftercare planning are: None Patient states they will know when they are safe and ready for discharge when: "When I am caught up on sleep" Does patient have access to transportation?: Yes Does patient have financial barriers related to discharge medications?: No Will patient be returning to same living situation after discharge?: Yes  Summary/Recommendations:   Summary and Recommendations (to be completed by the evaluator): Ryan Bartlett is a 22 YO [name] diagnosed with Schiophrenia.  He presents IVC'd due to angry, threatening behavior with family.  At d/c, [place] and work with [facility], whith whom he is already linked.  While here, Ryan Bartlett can benefit from crises stabilization, medication management, therapeutic milieu and referral for services.  Ida Rogueodney B Philis Doke. 04/09/2018

## 2018-04-09 NOTE — Progress Notes (Signed)
Recreation Therapy Notes  INPATIENT RECREATION THERAPY ASSESSMENT  Patient Details Name: Ryan Bartlett MRN: [id number] DOB: 1996/07/30 Today's Date: 04/09/2018       Information Obtained From: Patient  Able to Participate in Assessment/Interview: Yes  Patient Presentation: Oriented, Alert  Reason for Admission (Per Patient): Other (Comments)(Pt stated he doesn't remember.)  Patient Stressors: Other (Comment)(Not sleeping; everyday life)  Coping Skills:   Isolation, Self-Injury, Journal, TV, Arguments, Aggression, Music, Exercise, Deep Breathing, Meditate, Substance Abuse, Impulsivity, Talk, Other (Comment), Avoidance, Read, Hot Bath/Shower(Poetry)  Leisure Interests (2+):  Individual - Other (Comment), Social - Family(Sleep; play with family pets)  Frequency of Recreation/Participation: Monthly  Awareness of Community Resources:  Yes  Community Resources:  Park, Other (Comment)(Pool)  Current Use: Yes  If no, Barriers?:    Expressed Interest in State Street CorporationCommunity Resource Information: No  County of Residence:  [place]  Patient Main Form of Transportation: Therapist, musicublic Transportation  Patient Strengths:  Good with animals and kids  Patient Identified Areas of Improvement:  Being argumentative; aggressive  Patient Goal for Hospitalization:  "Don't get into any altercations"  Current SI (including self-harm):  No  Current HI:  No  Current AVH: No  Staff Intervention Plan: Group Attendance, Collaborate with Interdisciplinary Treatment Team  Consent to Intern Participation: N/A    Caroll RancherMarjette Ecko Beasley, LRT/CTRS  Caroll RancherLindsay, Corgan Mormile A 04/09/2018, 12:46 PM

## 2018-04-10 LAB — VALPROIC ACID LEVEL: VALPROIC ACID LVL: 68 ug/mL (ref 50.0–100.0)

## 2018-04-10 NOTE — Progress Notes (Signed)
Adult Psychoeducational Group Note  Date:  04/10/2018 Time:  9:01 PM  Group Topic/Focus:  Wrap-Up Group:   The focus of this group is to help patients review their daily goal of treatment and discuss progress on daily workbooks.  Participation Level:  Active  Participation Quality:  Appropriate  Affect:  Appropriate  Cognitive:  Appropriate  Insight: Appropriate  Engagement in Group:  Improving  Modes of Intervention:  Discussion  Additional Comments: The patient expressed that he rates today a 9.The patient also said that his goal was to work on his behavior.  Octavio Mannshigpen, Amandalynn Pitz Lee 04/10/2018, 9:01 PM

## 2018-04-10 NOTE — BHH Group Notes (Signed)
[name] Group Therapy Note  04/10/2018 1:15pm  Type of Therapy/Topic:  Group Therapy:  Balance in Life  Participation Level:  Did Not Attend  Description of Group:    This group will address the concept of balance and how it feels and looks when one is unbalanced. Patients will be encouraged to process areas in their lives that are out of balance and identify reasons for remaining unbalanced. Facilitators will guide patients in utilizing problem-solving interventions to address and correct the stressor making their life unbalanced. Understanding and applying boundaries will be explored and addressed for obtaining and maintaining a balanced life. Patients will be encouraged to explore ways to assertively make their unbalanced needs known to significant others in their lives, using other group members and facilitator for support and feedback.  Therapeutic Goals: 1. Patient will identify two or more emotions or situations they have that consume much of in their lives. 2. Patient will identify signs/triggers that life has become out of balance:  3. Patient will identify two ways to set boundaries in order to achieve balance in their lives:  4. Patient will demonstrate ability to communicate their needs through discussion and/or role plays  Summary of Patient Progress:      Therapeutic Modalities:   Cognitive Behavioral Therapy Solution-Focused Therapy Assertiveness Training  Ida RogueRodney B Diasia Henken, [place]CS[place] 04/10/2018 2:56 PM

## 2018-04-10 NOTE — Progress Notes (Signed)
Pt was observed in the dayroom, see interacting with peers/staff loudly. Pt appears animated/restless/hyperactive in affect and mood. Pt denies SI/HI/AVH/Pain at this time. Pt states he hopes to go home tomorrow. Pt requested to take Buspar at night. PRN trazodone and vistaril requested and given. Will continue with POC.

## 2018-04-10 NOTE — Progress Notes (Signed)
[facility]  04/10/2018 2:52 PM Ryan Bartlett  MRN:  [id number]  Subjective: Ryan Bartlett reports, "I'm fine, doing well. I'm here because I had an episode on Monday night. I had a fight with my mother & sister. I got upset & threatened that I was going to cut myself. I did not mean it because I was just venting. The next thing I knew, the ambulance came & took me to the hospital. That was how I ended up here. I'm doing good & feeling good. I'm not depressed or suicidal. All I needed was sleep because I was not sleeping well at home. I got the sleep now. I'm doing well now. I have got to be discharged. I really want to be home before my birthday".  Pershing Coxustin Ryan Bartlett is a 22 y/o M with history of schizophrenia, bipolar, PTSD, and ADHD who was [facility] from [facility] where he presented brought in by EMS/[facility] after pt had worsening agitation, disorganized behavior, SI, and HI in the context of altercation with his family. Police were contacted by his mother and overheard pt making SI/HI statements, so he was brought to [facility] for evaluation. [facility]. Upon initial evaluation, pt shares, "Me and my sister got into an argument. It was getting heated. My mom gave me a klonopin - which is one of her medications - and a trazodone, and that's about all I remember. At that point I just remember bright lights and thinking that I don't want to go back to jail." Pt verifies that he has been having SI without plan or intent for about 1 week and he may have made statements about SI during the altercation. He denies HI/AH/V.  Ryan Bartlett is seen, chart reviewed. The chart findings discussed with the treatment team. He presents alert, oriented & aware of situation. He is visible on the unit. He is making good eye contact. He presents with a good affects. He says coming to the hospital was because he threatened to cut himself after a  fight with his mom & sister which he says he did not mean to carry it out. He currently denies any symptoms of depression or anxiety. He says he is taking & tolerating his treatment regimen well. He denies any SIHI, AVH, delusional thoughts or paranoia. He does not appear to be responding to any internal stimuli. He says he has been sleeping well since his admission which what he says he  really needed as he was not sleeping well at home. He says he is ready to be discharged at least by his birthday coming up in few days. Ryan Bartlett has agreed to continue his current plan of care already in progress.  Principal Problem: Schizoaffective disorder, bipolar type (HCC)  Diagnosis:   Patient Active Problem List   Diagnosis Date Noted  . PTSD (post-traumatic stress disorder) [F43.10] 04/09/2018  . Schizoaffective disorder, bipolar type (HCC) [F25.0] 04/08/2018  . Adjustment disorder with depressed mood [F43.21] 11/22/2017  . Suicidal thoughts [R45.851] 11/07/2016  . Bipolar 2 disorder (HCC) [F31.81] 08/08/2016  . Methamphetamine abuse (HCC) [F15.10] 03/08/2016  . Bipolar affect, depressed (HCC) [F31.30] 09/18/2015  . Bipolar affective disorder, currently depressed, mild (HCC) [F31.31] 09/17/2015  . Intentional overdose of drug in tablet form (HCC) [T50.902A]   . Depression [F32.9]   . Suicidal ideation [R45.851]   . Cannabis use disorder, moderate, dependence (HCC) [F12.20] 06/06/2015  . Alcohol use disorder, moderate,  dependence (HCC) [F10.20] 06/06/2015  . Cannabis abuse [F12.10] 03/30/2012  . ADHD (attention deficit hyperactivity disorder), combined type [F90.2] 08/28/2011  . Conduct disorder, adolescent onset type [F91.2] 08/28/2011   Total Time spent with patient: 25 minutes  Past Psychiatric History: See H&P.  Past Medical History:  Past Medical History:  Diagnosis Date  . ADHD (attention deficit hyperactivity disorder)   . Arthritis   . Asthma   . Bipolar disorder (HCC)   . Depression    . Oppositional defiant disorder   . PTSD (post-traumatic stress disorder)   . Schizoaffective disorder (HCC)   . Unspecified episodic mood disorder     Past Surgical History:  Procedure Laterality Date  . NO PAST SURGERIES     Family History:  Family History  Problem Relation Age of Onset  . Depression Mother   . Diabetes Other   . Hyperlipidemia Other   . Hypertension Other    Family Psychiatric  History: See H&P  Social History:  Social History   Substance and Sexual Activity  Alcohol Use Yes   Comment: minimal     Social History   Substance and Sexual Activity  Drug Use Yes  . Types: Marijuana    Social History   Socioeconomic History  . Marital status: Single    Spouse name: Not on file  . Number of children: Not on file  . Years of education: Not on file  . Highest education level: Not on file  Occupational History  . Occupation: Dentist: [employer]    Comment: 12th grade at [employer]  Social Needs  . Financial resource strain: Not on file  . Food insecurity:    Worry: Not on file    Inability: Not on file  . Transportation needs:    Medical: Not on file    Non-medical: Not on file  Tobacco Use  . Smoking status: Current Every Day Smoker    Packs/day: 0.50    Years: 0.00    Pack years: 0.00    Types: Cigarettes  . Smokeless tobacco: Never Used  Substance and Sexual Activity  . Alcohol use: Yes    Comment: minimal  . Drug use: Yes    Types: Marijuana  . Sexual activity: Yes    Partners: Female    Comment: Pt reports that he is not sexually active  Lifestyle  . Physical activity:    Days per week: Not on file    Minutes per session: Not on file  . Stress: Not on file  Relationships  . Social connections:    Talks on phone: Not on file    Gets together: Not on file    Attends religious service: Not on file    Active member of club or organization: Not on file    Attends meetings of clubs or organizations: Not on file     Relationship status: Not on file  Other Topics Concern  . Not on file  Social History Narrative  . Not on file   Additional Social History:   Sleep: Good  Appetite:  Good  Current Medications: Current Facility-Administered Medications  Medication Dose Route Frequency Provider Last Rate Last Dose  . albuterol (PROVENTIL HFA;VENTOLIN HFA) 108 (90 Base) MCG/ACT inhaler 1-2 puff  1-2 puff Inhalation Q4H PRN Micheal Likens, MD      . ARIPiprazole (ABILIFY) tablet 10 mg  10 mg Oral Daily Micheal Likens, MD   10 mg at 04/10/18 4098  .  busPIRone (BUSPAR) tablet 15 mg  15 mg Oral TID Micheal Likens, MD   15 mg at 04/10/18 1214  . cyclobenzaprine (FLEXERIL) tablet 10 mg  10 mg Oral TID PRN Micheal Likens, MD      . divalproex (DEPAKOTE) DR tablet 500 mg  500 mg Oral Q12H Micheal Likens, MD   500 mg at 04/10/18 1610  . hydrOXYzine (ATARAX/VISTARIL) tablet 50 mg  50 mg Oral Q6H PRN Micheal Likens, MD   50 mg at 04/09/18 2100  . ibuprofen (ADVIL,MOTRIN) tablet 600 mg  600 mg Oral Q6H PRN Nira Conn A, NP   600 mg at 04/09/18 1656  . OLANZapine zydis (ZYPREXA) disintegrating tablet 10 mg  10 mg Oral Q8H PRN Rankin, Shuvon B, NP       And  . LORazepam (ATIVAN) tablet 1 mg  1 mg Oral PRN Rankin, Shuvon B, NP       And  . ziprasidone (GEODON) injection 20 mg  20 mg Intramuscular PRN Rankin, Shuvon B, NP      . nicotine (NICODERM CQ - dosed in mg/24 hours) patch 21 mg  21 mg Transdermal Daily Rankin, Shuvon B, NP      . nicotine polacrilex (NICORETTE) gum 2 mg  2 mg Oral PRN Nira Conn A, NP      . traZODone (DESYREL) tablet 50 mg  50 mg Oral QHS PRN,MR X 1 [place], Burlene Arnt, MD   50 mg at 04/09/18 2100   Lab Results:  Results for orders placed or performed during the hospital encounter of 04/08/18 (from the past 48 hour(s))  Valproic acid level     Status: None   Collection Time: 04/10/18  6:34 AM  Result Value Ref Range    Valproic Acid Lvl 68 50.0 - 100.0 ug/mL    Comment: Performed at [facility], [address] [place], [place] [address]   Blood Alcohol level:  Lab Results  Component Value Date   ETH <10 04/07/2018   ETH <10 11/22/2017   Metabolic Disorder Labs: Lab Results  Component Value Date   HGBA1C 5.6 08/11/2016   MPG 114 08/11/2016   MPG 117 08/10/2016   Lab Results  Component Value Date   PROLACTIN 65.2 (H) 08/09/2016   Lab Results  Component Value Date   CHOL 181 08/11/2016   TRIG 116 08/11/2016   HDL 43 08/11/2016   CHOLHDL 4.2 08/11/2016   VLDL 23 08/11/2016   LDLCALC 115 (H) 08/11/2016   LDLCALC 126 (H) 08/10/2016   Physical Findings: AIMS: Facial and Oral Movements Muscles of Facial Expression: None, normal Lips and Perioral Area: None, normal Jaw: None, normal Tongue: None, normal,Extremity Movements Upper (arms, wrists, hands, fingers): None, normal Lower (legs, knees, ankles, toes): None, normal, Trunk Movements Neck, shoulders, hips: None, normal, Overall Severity Severity of abnormal movements (highest score from questions above): None, normal Incapacitation due to abnormal movements: None, normal Patient's awareness of abnormal movements (rate only patient's report): No Awareness, Dental Status Current problems with teeth and/or dentures?: No Does patient usually wear dentures?: No  CIWA:    COWS:     Musculoskeletal: Strength & Muscle Tone: within normal limits Gait & Station: normal Patient leans: N/A  Psychiatric Specialty Exam: Physical Exam  Nursing note and vitals reviewed.   Review of Systems  Psychiatric/Behavioral: Positive for substance abuse (Hx. Cannabis use disorder). Negative for depression, hallucinations, memory loss and suicidal ideas. The patient is not nervous/anxious and does not  have insomnia.     Blood pressure 107/82, pulse 90, temperature 97.6 F (36.4 C), temperature source Oral, resp. rate 18, height 5\' 9"   (1.753 m), weight 85.7 kg.Body mass index is 27.91 kg/m.  General Appearance: Casual and Fairly Groomed  Eye Contact:  Good  Speech:  Clear and Coherent and Normal Rate  Volume:  Normal  Mood:  Euthymic  Affect:  Appropriate, Congruent and Constricted  Thought Process:  Coherent and Goal Directed  Orientation:  Full (Time, Place, and Person)  Thought Content:  Logical  Suicidal Thoughts:  Yes.  without intent/plan  Homicidal Thoughts:  No  Memory:  Immediate;   Fair Recent;   Fair Remote;   Fair  Judgement:  Fair  Insight:  Fair  Psychomotor Activity:  Normal  Concentration:  Concentration: Fair  Recall:  [employer] of Knowledge:  Fair  Language:  Fair  Akathisia:  No  Handed:    AIMS (if indicated):     Assets:  Resilience Social Support  ADL's:  Intact  Cognition:  WNL  Sleep:  Number of Hours: 6.75     Treatment Plan Summary: Daily contact with patient to assess and evaluate symptoms and progress in treatment.  - Continue inpatient hospitalization.  - Will continue today 04/10/2018 plan as below except where it is noted.  Mood control.     - Continue Abilify 10 mg po daily.  Mood Stabilization.     - Continue Depakote DR 100 mg po Q 12 hours.  Anxiety.     - Continue Buspar 15 mg po tid.     - Continue Vistaril 50 mg po Q 6 hours prn.  Agitation/psychosis.     - Continue Zyprexa Zydis 10 mg Q 8 hours prn &     - Lorazepam 1 mg po prn x 1 dose &     - Geodon 20 mg IM prn x 1 dose.  Insomnia.     - Continue Trazodone 50 mg po Q hs prn, may repeat.  Nicotine withdrawal.     - Continue Nicoderm 21 mg topically Q 24 hours.  -Patient to continue to participate in the group milieu.  -Discharge disposition on going.  Armandina Stammer, NP, PMHNP, FNP-BC. 04/10/2018, 2:52 PM

## 2018-04-10 NOTE — Plan of Care (Signed)
  Problem: Safety: Goal: Periods of time without injury will increase Outcome: Progressing   Problem: Activity: Goal: Interest or engagement in activities will improve Outcome: Progressing   Problem: Medication: Goal: Compliance with prescribed medication regimen will improve Outcome: Progressing  DAR NOTE: Patient presents with calmaffect and mood.  Denies suicidal thoughts, pain, auditory and visual hallucinations.  Rates depression at 0, hopelessness at 0, and anxiety at 0.  Maintained on routine safety checks.  Medications given as prescribed.  Support and encouragement offered as needed. Patient remained in his room for majority of this shift.  Minimal interactions with staff and peers.  Offered no complaint.

## 2018-04-10 NOTE — Progress Notes (Signed)
Recreation Therapy Notes  Date: 8.22.19 Time: 1000 Location: 500 Hall Dayroom  Group Topic: Self-Esteem  Goal Area(s) Addresses:  Patient will successfully identify positive attributes about themselves.  Patient will successfully identify benefit of improved self-esteem.   Intervention: Magazines, scissors, glue sticks, Holiday representativeconstruction paper, music  Activity: Brochure About Me.  Patients were to create a brochure that described their uniqueness and any positive characteristics about them.  LRT played music in the background as patients worked on their brochures.   Education:  Self-Esteem, Building control surveyorDischarge Planning.   Education Outcome: Acknowledges education/In group clarification offered/Needs additional education  Clinical Observations/Feedback: Pt did not attend group.    Caroll RancherMarjette Kruz Chiu, LRT/CTRS         Caroll RancherLindsay, Frenchie Dangerfield A 04/10/2018 12:36 PM

## 2018-04-11 DIAGNOSIS — F4310 Post-traumatic stress disorder, unspecified: Secondary | ICD-10-CM

## 2018-04-11 MED ORDER — ALBUTEROL SULFATE HFA 108 (90 BASE) MCG/ACT IN AERS: 1.0000 | INHALATION_SPRAY | RESPIRATORY_TRACT | Status: DC | PRN

## 2018-04-11 MED ORDER — DIVALPROEX SODIUM 500 MG PO DR TAB: 500.0000 mg | DELAYED_RELEASE_TABLET | Freq: Two times a day (BID) | ORAL | 0 refills | Status: DC

## 2018-04-11 MED ORDER — HYDROXYZINE HCL 50 MG PO TABS: 50.0000 mg | ORAL_TABLET | Freq: Four times a day (QID) | ORAL | 0 refills | Status: DC | PRN

## 2018-04-11 MED ORDER — NICOTINE 21 MG/24HR TD PT24: 21.0000 mg | MEDICATED_PATCH | Freq: Every day | TRANSDERMAL | 0 refills | Status: DC

## 2018-04-11 MED ORDER — TRAZODONE HCL 50 MG PO TABS: 50.0000 mg | ORAL_TABLET | Freq: Every evening | ORAL | 0 refills | Status: DC | PRN

## 2018-04-11 MED ORDER — BUSPIRONE HCL 15 MG PO TABS: 15.0000 mg | ORAL_TABLET | Freq: Three times a day (TID) | ORAL | 0 refills | Status: DC

## 2018-04-11 MED ORDER — ARIPIPRAZOLE 10 MG PO TABS: 10.0000 mg | ORAL_TABLET | Freq: Every day | ORAL | 0 refills | Status: DC

## 2018-04-11 MED ORDER — NICOTINE POLACRILEX 2 MG MT GUM: 2.0000 mg | CHEWING_GUM | OROMUCOSAL | 0 refills | Status: DC | PRN

## 2018-04-11 NOTE — Plan of Care (Signed)
Pt attended one recreation therapy group session.    Kendy Haston, LRT/CTRS 

## 2018-04-11 NOTE — Progress Notes (Signed)
Recreation Therapy Notes  Date: 8.23.19 Time: 1000 Location: 500 Hall Dayroom   Group Topic: Communication, Team Building, Problem Solving  Goal Area(s) Addresses:  Patient will effectively work with peer towards shared goal.  Patient will identify skill used to make activity successful.  Patient will identify how skills used during activity can be used to reach post d/c goals.   Behavioral Response: Engaged  Intervention: STEM Activity   Activity: [place] [employer]. Patients were provided the following materials: 5 drinking straws, 5 rubber bands, 5 paper clips, 2 index cards, 2 drinking cups, and 2 toilet paper rolls. Using the provided materials patients were asked to build a launching mechanisms to launch a ping pong ball approximately 12 feet. Patients were divided into teams of 3-5.   Education: Pharmacist, communityocial Skills, Building control surveyorDischarge Planning.   Education Outcome: Acknowledges education/In group clarification offered/Needs additional education.   Clinical Observations/Feedback: Pt was active but playful.  Pt worked well with his peer.  Pt stated the group used problem solving to complete the activity.  Pt also stated the skills from the activity could be used with their support systems by "talking to them and problem solving" to come up with solutions.    Caroll RancherMarjette Graycen Sadlon, LRT/CTRS     Caroll RancherLindsay, Yago Ludvigsen A 04/11/2018 12:39 PM

## 2018-04-11 NOTE — [facility] Suicide Risk Assessment (Signed)
[facility] Discharge Suicide Risk Assessment   Principal Problem: Schizoaffective disorder, bipolar type [facility]) Discharge Diagnoses:  Patient Active Problem List   Diagnosis Date Noted  . PTSD (post-traumatic stress disorder) [F43.10] 04/09/2018  . Schizoaffective disorder, bipolar type (HCC) [F25.0] 04/08/2018  . Adjustment disorder with depressed mood [F43.21] 11/22/2017  . Suicidal thoughts [R45.851] 11/07/2016  . Bipolar 2 disorder (HCC) [F31.81] 08/08/2016  . Methamphetamine abuse (HCC) [F15.10] 03/08/2016  . Bipolar affect, depressed (HCC) [F31.30] 09/18/2015  . Bipolar affective disorder, currently depressed, mild (HCC) [F31.31] 09/17/2015  . Intentional overdose of drug in tablet form (HCC) [T50.902A]   . Depression [F32.9]   . Suicidal ideation [R45.851]   . Cannabis use disorder, moderate, dependence (HCC) [F12.20] 06/06/2015  . Alcohol use disorder, moderate, dependence (HCC) [F10.20] 06/06/2015  . Cannabis abuse [F12.10] 03/30/2012  . ADHD (attention deficit hyperactivity disorder), combined type [F90.2] 08/28/2011  . Conduct disorder, adolescent onset type [F91.2] 08/28/2011    Total Time spent with patient: 30 minutes  Musculoskeletal: Strength & Muscle Tone: within normal limits Gait & Station: normal Patient leans: N/A  Psychiatric Specialty Exam: Review of Systems  Constitutional: Negative for chills and fever.  Respiratory: Negative for cough and shortness of breath.   Cardiovascular: Negative for chest pain.  Gastrointestinal: Negative for abdominal pain, heartburn, nausea and vomiting.  Psychiatric/Behavioral: Negative for depression, hallucinations and suicidal ideas. The patient is not nervous/anxious and does not have insomnia.     Blood pressure (!) 125/91, pulse 86, temperature 98.2 F (36.8 C), temperature source Oral, resp. rate 16, height 5\' 9"  (1.753 m), weight 85.7 kg.Body mass index is 27.91 kg/m.  General Appearance: Casual and Fairly Groomed  Eye  Contact::  Good  Speech:  Clear and Coherent and Normal Rate409  Volume:  Normal  Mood:  Euthymic  Affect:  Appropriate and Congruent  Thought Process:  Coherent and Goal Directed  Orientation:  Full (Time, Place, and Person)  Thought Content:  Logical  Suicidal Thoughts:  No  Homicidal Thoughts:  No  Memory:  Immediate;   Fair Recent;   Fair Remote;   Fair  Judgement:  Fair  Insight:  Lacking  Psychomotor Activity:  Normal  Concentration:  Fair  Recall:  [employer] of Knowledge:Fair  Language: Fair  Akathisia:  No  Handed:    AIMS (if indicated):     Assets:  Resilience Social Support  Sleep:  Number of Hours: 6  Cognition: WNL  ADL's:  Intact   Mental Status Per Nursing Assessment::   On Admission:  Self-harm thoughts  Demographic Factors:  Male, Adolescent or young adult and Low socioeconomic status  Loss Factors: Financial problems/change in socioeconomic status  Historical Factors: Impulsivity  Risk Reduction Factors:   Sense of responsibility to family, Living with another person, especially a relative, Positive social support, Positive therapeutic relationship and Positive coping skills or problem solving skills  Continued Clinical Symptoms:  Severe Anxiety and/or Agitation Bipolar Disorder:   Mixed State Schizophrenia:   Less than 82 years old  Cognitive Features That Contribute To Risk:  None    Suicide Risk:  Minimal: No identifiable suicidal ideation.  Patients presenting with no risk factors but with morbid ruminations; may be classified as minimal risk based on the severity of the depressive symptoms  Follow-up Information    [facility] Follow up.   Why:  Someone from the ACT team will pick ou up today. Contact information: [address] [place] [address] [phone]  Subjective Data:  Ryan Bartlett is a 22 y/o M with history of schizophrenia, bipolar, PTSD, and ADHD who [facility] where he presented brought in by EMS/[facility] after pt had worsening agitation, disorganized behavior, SI, and HI in the context of altercation with his family. Police were contacted by his mother and overheard pt making SI/HI statements, so he was brought to [facility] for evaluation. Pt was medically cleared and [facility]. Pt was restarted on depakote and buspar, and home med of prozac was discontinued and instead he was started on trial of abilify. He reported incremental improvement of presenting symptoms.  Today upon evaluation, pt shares, "I'm good." He denies any specific concerns today. He is sleeping well. His appetite is good. He denies other physical complaints. He denies SI/HI/AH/VH.  He is tolerating his medications well, and he is in agreement to continue his current regimen without changes. He plans to have outpatient follow up with his ACT Team. He was able to engage in safety planning including [facility] or contact emergency services if he feels unable to maintain his own safety or the safety of others. Pt had no further questions, comments, or concerns.   Plan Of Care/Follow-up recommendations:   - Discharge to outpatient level of care  -Schizoaffective disorder, bipolar type and PTSD     - Continue Abilify 10 mg po daily     - Continue Depakote DR 500 mg po Q 12 hours.  Anxiety.     - Continue Buspar 15 mg po tid.     - Continue Vistaril 50 mg po Q 6 hours prn anxiety.  Insomnia.     - Continue Trazodone 50 mg po Q hs prn insomnia  Activity:  as tolerated Diet:  normal Tests:  NA Other:  see above for DC plan    Micheal Likenshristopher T Doloros Kwolek, MD 04/11/2018, 9:40 AM

## 2018-04-11 NOTE — Progress Notes (Signed)
Recreation Therapy Notes  INPATIENT RECREATION TR PLAN  Patient Details Name: Ryan Bartlett MRN: [id number] DOB: 03/05/1996 Today's Date: 04/11/2018  Rec Therapy Plan Is patient appropriate for Therapeutic Recreation?: Yes Treatment times per week: about 3 days Estimated Length of Stay: 5-7 days TR Treatment/Interventions: Group participation (Comment)  Discharge Criteria Pt will be discharged from therapy if:: Discharged Treatment plan/goals/alternatives discussed and agreed upon by:: Patient/family  Discharge Summary Short term goals set: See patient care plan Short term goals met: Adequate for discharge Progress toward goals comments: Groups attended Which groups?: Other (Comment)(Team building) Reason goals not met: None Therapeutic equipment acquired: N/A Reason patient discharged from therapy: Discharge from hospital Pt/family agrees with progress & goals achieved: Yes Date patient discharged from therapy: 04/11/18     , LRT/CTRS  ,  A 04/11/2018, 12:30 PM  

## 2018-04-11 NOTE — [facility] Suicide Risk Assessment (Signed)
[facility] INPATIENT:  Family/Significant Other Suicide Prevention Education  Suicide Prevention Education:  Patient Refusal for Family/Significant Other Suicide Prevention Education: The patient Ryan Bartlett has refused to provide written consent for family/significant other to be provided Family/Significant Other Suicide Prevention Education during admission and/or prior to discharge.  Physician notified.  Ida RogueRodney B Kassaundra Hair 04/11/2018, 9:31 AM

## 2018-04-11 NOTE — Progress Notes (Signed)
  [facility] Discharge Plan :  Will you be returning to the same living situation after discharge:  [place] At discharge, do you have transportation home?: Yes,  ACT team Do you have the ability to pay for your medications: Yes,  MCD  Release of information consent forms completed and in the chart;  Patient's signature needed at discharge.  Patient to Follow up at: Follow-up Information    [facility] Follow up.   Why:  Someone from the ACT team will pick ou up today. Contact information: [address] [place] [address] [phone]           Next level of care provider has [facility] to [facility] Link:no  Safety Planning and Suicide Prevention discussed: Yes,  yes  Have you used any form of tobacco in the last 30 days? (Cigarettes, Smokeless Tobacco, Cigars, and/or Pipes): Yes  Has patient been referred to the Quitline?: Patient refused referral  Patient has been referred for addiction treatment: Pt. refused referral  Ida RogueRodney B Bohden Dung, [name] 04/11/2018, 9:32 AM

## 2018-04-11 NOTE — Discharge Summary (Addendum)
Physician Discharge Summary Note  Patient:  Ryan Bartlett is an 22 y.o., male  MRN:  [id number]  DOB:  03/11/1996  Patient phone:  [phone] (home)   Patient address:   [address] [place] [id number],  Total Time spent with patient: Greater than 30 minutes  Date of Admission:  04/08/2018  Date of Discharge: 04-11-18  Reason for Admission: Worsening agitation, disorganized behavior, SI, and HI in the context of altercation with his family.   Principal Problem: Schizoaffective disorder, bipolar type [facility](HCC)  Discharge Diagnoses: Patient Active Problem List   Diagnosis Date Noted  . PTSD (post-traumatic stress disorder) [F43.10] 04/09/2018  . Schizoaffective disorder, bipolar type (HCC) [F25.0] 04/08/2018  . Adjustment disorder with depressed mood [F43.21] 11/22/2017  . Suicidal thoughts [R45.851] 11/07/2016  . Bipolar 2 disorder (HCC) [F31.81] 08/08/2016  . Methamphetamine abuse (HCC) [F15.10] 03/08/2016  . Bipolar affect, depressed (HCC) [F31.30] 09/18/2015  . Bipolar affective disorder, currently depressed, mild (HCC) [F31.31] 09/17/2015  . Intentional overdose of drug in tablet form (HCC) [T50.902A]   . Depression [F32.9]   . Suicidal ideation [R45.851]   . Cannabis use disorder, moderate, dependence (HCC) [F12.20] 06/06/2015  . Alcohol use disorder, moderate, dependence (HCC) [F10.20] 06/06/2015  . Cannabis abuse [F12.10] 03/30/2012  . ADHD (attention deficit hyperactivity disorder), combined type [F90.2] 08/28/2011  . Conduct disorder, adolescent onset type [F91.2] 08/28/2011   Musculoskeletal: Strength & Muscle Tone: within normal limits Gait & Station: normal Patient leans: N/A  Psychiatric Specialty Exam:  SEE MD SRA Physical Exam  Vitals reviewed. Constitutional: He is oriented to person, place, and time. He appears well-developed.  HENT:  Head: Normocephalic.  Eyes: Pupils are equal, round, and reactive to light.  Neck: Normal range of  motion.  Cardiovascular: Normal rate.  Respiratory: Effort normal.  GI: Soft.  Genitourinary:  Genitourinary Comments: Denies any issues in this area   Musculoskeletal: Normal range of motion.  Neurological: He is alert and oriented to person, place, and time.  Skin: Skin is warm and dry.  Psychiatric: His mood appears not anxious. Thought content is not paranoid. He expresses no homicidal and no suicidal ideation.    Review of Systems  Constitutional: Negative.   HENT: Negative.   Eyes: Negative.   Respiratory: Negative.   Cardiovascular: Negative.   Gastrointestinal: Negative.   Genitourinary: Negative.   Musculoskeletal: Negative.   Skin: Negative.   Neurological: Negative.   Endo/Heme/Allergies: Negative.   Psychiatric/Behavioral: Positive for depression (Stable) and substance abuse (Hx alcoholism). Negative for hallucinations, memory loss and suicidal ideas. The patient has insomnia (Stable). The patient is not nervous/anxious.   All other systems reviewed and are negative.   Blood pressure (!) 125/91, pulse 86, temperature 98.2 F (36.8 C), temperature source Oral, resp. rate 16, height 5\' 9"  (1.753 m), weight 85.7 kg.Body mass index is 27.91 kg/m.  Have you used any form of tobacco in the last 30 days? (Cigarettes, Smokeless Tobacco, Cigars, and/or Pipes): Yes   Has this patient used any form of tobacco in the last 30 days? (Cigarettes, Smokeless Tobacco, Cigars, and/or Pipes): Yes, provided with a Nicotine gum prescription upon discharge.  Past Medical History:  Past Medical History:  Diagnosis Date  . ADHD (attention deficit hyperactivity disorder)   . Arthritis   . Asthma   . Bipolar disorder (HCC)   . Depression   . Oppositional defiant disorder   . PTSD (post-traumatic stress disorder)   . Schizoaffective disorder (HCC)   .  Unspecified episodic mood disorder     Past Surgical History:  Procedure Laterality Date  . NO PAST SURGERIES     Family History:   Family History  Problem Relation Age of Onset  . Depression Mother   . Diabetes Other   . Hyperlipidemia Other   . Hypertension Other    Social History:  Social History   Substance and Sexual Activity  Alcohol Use Yes   Comment: minimal     Social History   Substance and Sexual Activity  Drug Use Yes  . Types: Marijuana    Social History   Socioeconomic History  . Marital status: Single    Spouse name: Not on file  . Number of children: Not on file  . Years of education: Not on file  . Highest education level: Not on file  Occupational History  . Occupation: Dentist: [employer]    Comment: 12th grade at [employer]  Social Needs  . Financial resource strain: Not on file  . Food insecurity:    Worry: Not on file    Inability: Not on file  . Transportation needs:    Medical: Not on file    Non-medical: Not on file  Tobacco Use  . Smoking status: Current Every Day Smoker    Packs/day: 0.50    Years: 0.00    Pack years: 0.00    Types: Cigarettes  . Smokeless tobacco: Never Used  Substance and Sexual Activity  . Alcohol use: Yes    Comment: minimal  . Drug use: Yes    Types: Marijuana  . Sexual activity: Yes    Partners: Female    Comment: Pt reports that he is not sexually active  Lifestyle  . Physical activity:    Days per week: Not on file    Minutes per session: Not on file  . Stress: Not on file  Relationships  . Social connections:    Talks on phone: Not on file    Gets together: Not on file    Attends religious service: Not on file    Active member of club or organization: Not on file    Attends meetings of clubs or organizations: Not on file    Relationship status: Not on file  Other Topics Concern  . Not on file  Social History Narrative  . Not on file   Hospital Course: (Per Md's discharge SRA): Dejion Grillo is a 22 y/o M with history of schizophrenia, bipolar, PTSD, and ADHD who was admitted under IVC from [facility] where he  presented brought in by EMS/[facility] after pt had worsening agitation, disorganized behavior, SI, and HI in the context of altercation with his family. Police were contacted by his mother and overheard pt making SI/HI statements, so he was brought to [facility] for evaluation. Pt was medically cleared and then transferred to [facility] for additional treatment and stabilization. Pt was restarted on depakote and buspar, and home med of prozac was discontinued and instead he was started on trial of abilify. He reported incremental improvement of presenting symptoms.  Today upon evaluation, pt shares, "I'm good." He denies any specific concerns today. He is sleeping well. His appetite is good. He denies other physical complaints. He denies SI/HI/AH/VH.  He is tolerating his medications well, and he is in agreement to continue his current regimen without changes. He plans to have outpatient follow up with his ACT Team. He was able to engage in safety planning including plan to return  to [facility] or contact emergency services if he feels unable to maintain his own safety or the safety of others. Pt had no further questions, comments, or concerns.  Plan Of Care/Follow-up recommendations:   - Discharge to outpatient level of care  -Schizoaffective disorder, bipolar type and PTSD - Continue Abilify 10 mg po daily - Continue Depakote DR 500 mg po Q 12 hours.  Anxiety. - Continue Buspar 15 mg po tid. - Continue Vistaril 50 mg po Q 6 hours prn anxiety.  Insomnia. - Continue Trazodone 50 mg po Q hs prn insomnia  Activity:  as tolerated Diet:  normal Tests:  NA Other:  see above for DC plan    Discharge Vitals:   Blood pressure (!) 125/91, pulse 86, temperature 98.2 F (36.8 C), temperature source Oral, resp. rate 16, height 5\' 9"  (1.753 m), weight 85.7 kg. Body mass index is 27.91 kg/m.  Lab Results:   Results for orders placed or performed during the hospital encounter of 04/08/18 (from  the past 72 hour(s))  Valproic acid level     Status: None   Collection Time: 04/10/18  6:34 AM  Result Value Ref Range   Valproic Acid Lvl 68 50.0 - 100.0 ug/mL    Comment: Performed at [facility], [address] [place], [place] [address]   Physical Findings: AIMS: Facial and Oral Movements Muscles of Facial Expression: None, normal Lips and Perioral Area: None, normal Jaw: None, normal Tongue: None, normal,Extremity Movements Upper (arms, wrists, hands, fingers): None, normal Lower (legs, knees, ankles, toes): None, normal, Trunk Movements Neck, shoulders, hips: None, normal, Overall Severity Severity of abnormal movements (highest score from questions above): None, normal Incapacitation due to abnormal movements: None, normal Patient's awareness of abnormal movements (rate only patient's report): No Awareness, Dental Status Current problems with teeth and/or dentures?: No Does patient usually wear dentures?: No  CIWA:    COWS:     See Psychiatric Specialty Exam and Suicide Risk Assessment completed by Attending Physician prior to discharge.  Discharge destination:  Home  Is patient on multiple antipsychotic therapies at discharge:  No   Has Patient had three or more failed trials of antipsychotic monotherapy by history:  No   Recommended Plan for Multiple Antipsychotic Therapies: NA   Allergies as of 04/11/2018      Reactions   Peanut-containing Drug Products Anaphylaxis   Lactose Intolerance (gi) Diarrhea, Nausea And Vomiting      Medication List    STOP taking these medications   acetaminophen 325 MG tablet Commonly known as:  TYLENOL   cyclobenzaprine 10 MG tablet Commonly known as:  FLEXERIL   divalproex 250 MG 24 hr tablet Commonly known as:  DEPAKOTE ER Replaced by:  divalproex 500 MG DR tablet   FLUoxetine 20 MG capsule Commonly known as:  PROZAC   ibuprofen 600 MG tablet Commonly known as:  ADVIL,MOTRIN     TAKE these  medications     Indication  albuterol 108 (90 Base) MCG/ACT inhaler Commonly known as:  PROVENTIL HFA;VENTOLIN HFA Inhale 1-2 puffs into the lungs every 4 (four) hours as needed for wheezing or shortness of breath. What changed:  how much to take  Indication:  Asthma   ARIPiprazole 10 MG tablet Commonly known as:  ABILIFY Take 1 tablet (10 mg total) by mouth daily. For mood control Start taking on:  04/12/2018  Indication:  Mood control   busPIRone 15 MG tablet Commonly known as:  BUSPAR Take 1 tablet (15  mg total) by mouth 3 (three) times daily. For anxiety What changed:    medication strength  how much to take  additional instructions  Indication:  Anxiety Disorder   divalproex 500 MG DR tablet Commonly known as:  DEPAKOTE Take 1 tablet (500 mg total) by mouth every 12 (twelve) hours. For mood stabilization Replaces:  divalproex 250 MG 24 hr tablet  Indication:  Mood stabilization   hydrOXYzine 50 MG tablet Commonly known as:  ATARAX/VISTARIL Take 1 tablet (50 mg total) by mouth every 6 (six) hours as needed for anxiety.  Indication:  Feeling Anxious   nicotine 21 mg/24hr patch Commonly known as:  NICODERM CQ - dosed in mg/24 hours Place 1 patch (21 mg total) onto the skin daily. (May buy from over the counter): For smoking cessation  Indication:  Nicotine Addiction   nicotine polacrilex 2 MG gum Commonly known as:  NICORETTE Take 1 each (2 mg total) by mouth as needed for smoking cessation.  Indication:  Nicotine Addiction   traZODone 50 MG tablet Commonly known as:  DESYREL Take 1 tablet (50 mg total) by mouth at bedtime as needed for sleep. What changed:  Another medication with the same name was removed. Continue taking this medication, and follow the directions you see here.  Indication:  Trouble Sleeping       Follow-up recommendations: Activity:  As tolerated Diet: As recommended by your primary care doctor. Keep all scheduled follow-up appointments  as recommended.  Comments:  Take all your medications as prescribed by your mental healthcare provider. Report any adverse effects and or reactions from your medicines to your outpatient provider promptly. Patient is instructed and cautioned to not engage in alcohol and or illegal drug use while on prescription medicines. In the event of worsening symptoms, patient is instructed to call the crisis hotline, 911 and or go to the nearest ED for appropriate evaluation and treatment of symptoms. Follow-up with your primary care provider for your other medical issues, concerns and or health care needs.   Signed: Armandina Stammer PMHNP, FNP-BC 04/11/2018, 8:43 AM    Patient seen, Suicide Assessment Completed.  Disposition Plan Reviewed

## 2018-04-11 NOTE — Progress Notes (Signed)
Patient discharged to lobby. Patient was stable and appreciative at that time. All papers, samples and prescriptions were given and valuables returned. Verbal understanding expressed. Denies SI/HI and A/VH. Patient given opportunity to express concerns and ask questions.  

## 2018-05-06 ENCOUNTER — Encounter (HOSPITAL_COMMUNITY): Payer: Self-pay | Admitting: Emergency Medicine

## 2018-05-06 ENCOUNTER — Emergency Department (HOSPITAL_COMMUNITY)
Admission: EM | Admit: 2018-05-06 | Discharge: 2018-05-06 | Disposition: A | Discharge status: Home / Self Care | Payer: Medicaid Other | Attending: Emergency Medicine | Admitting: Emergency Medicine

## 2018-05-06 ENCOUNTER — Other Ambulatory Visit: Payer: Self-pay

## 2018-05-06 DIAGNOSIS — G8929 Other chronic pain: Secondary | ICD-10-CM | POA: Diagnosis not present

## 2018-05-06 DIAGNOSIS — F17210 Nicotine dependence, cigarettes, uncomplicated: Secondary | ICD-10-CM | POA: Diagnosis not present

## 2018-05-06 DIAGNOSIS — M549 Dorsalgia, unspecified: Secondary | ICD-10-CM | POA: Diagnosis present

## 2018-05-06 DIAGNOSIS — J45909 Unspecified asthma, uncomplicated: Secondary | ICD-10-CM | POA: Insufficient documentation

## 2018-05-06 DIAGNOSIS — Z79899 Other long term (current) drug therapy: Secondary | ICD-10-CM | POA: Insufficient documentation

## 2018-05-06 MED ORDER — METHOCARBAMOL 500 MG PO TABS: 500.0000 mg | ORAL_TABLET | Freq: Three times a day (TID) | ORAL | 0 refills | Status: DC | PRN

## 2018-05-06 MED ORDER — NAPROXEN 500 MG PO TABS: 500.0000 mg | ORAL_TABLET | Freq: Two times a day (BID) | ORAL | 0 refills | Status: DC

## 2018-05-06 NOTE — ED Provider Notes (Signed)
[facility]-EMERGENCY DEPT Provider Note   CSN: [id number] Arrival date & time: 05/06/18  0543     History   Chief Complaint Chief Complaint  Patient presents with  . Back Pain    HPI Ryan Bartlett is a 22 y.o. male with a hx of tobacco abuse, bipolar disorder, ODD, schizoaffective disorder, and polysubstance abuse who presents to the ED with complaints of back pain x 9 months. Patient describes the pain as being bilateral to mid to lower back, constant, improved/worsened based on position/movement. He was initially prescribed what he considers lower doses of flexeril and mobic, these initially were helping his pain, but have stopped having as much effect. Last took these medications 2 days ago. No significant change in severity of pain to prompt ER visit today. Denies numbness, tingling, weakness, incontinence to bowel/bladder, fever, chills, IV drug use, or hx of cancer.  Denies dysuria, hematuria, frequency, or urgency.    HPI  Past Medical History:  Diagnosis Date  . ADHD (attention deficit hyperactivity disorder)   . Arthritis   . Asthma   . Bipolar disorder (HCC)   . Depression   . Oppositional defiant disorder   . PTSD (post-traumatic stress disorder)   . Schizoaffective disorder (HCC)   . Unspecified episodic mood disorder     Patient Active Problem List   Diagnosis Date Noted  . PTSD (post-traumatic stress disorder) 04/09/2018  . Schizoaffective disorder, bipolar type (HCC) 04/08/2018  . Adjustment disorder with depressed mood 11/22/2017  . Suicidal thoughts 11/07/2016  . Bipolar 2 disorder (HCC) 08/08/2016  . Methamphetamine abuse (HCC) 03/08/2016  . Bipolar affect, depressed (HCC) 09/18/2015  . Bipolar affective disorder, currently depressed, mild (HCC) 09/17/2015  . Intentional overdose of drug in tablet form (HCC)   . Depression   . Suicidal ideation   . Cannabis use disorder, moderate, dependence (HCC) 06/06/2015  . Alcohol use  disorder, moderate, dependence (HCC) 06/06/2015  . Cannabis abuse 03/30/2012  . ADHD (attention deficit hyperactivity disorder), combined type 08/28/2011  . Conduct disorder, adolescent onset type 08/28/2011    Past Surgical History:  Procedure Laterality Date  . NO PAST SURGERIES          Home Medications    Prior to Admission medications   Medication Sig Start Date End Date Taking? Authorizing Provider  albuterol (PROVENTIL HFA;VENTOLIN HFA) 108 (90 Base) MCG/ACT inhaler Inhale 1-2 puffs into the lungs every 4 (four) hours as needed for wheezing or shortness of breath. 04/11/18   Armandina StammerNwoko, Agnes I, NP  ARIPiprazole (ABILIFY) 10 MG tablet Take 1 tablet (10 mg total) by mouth daily. For mood control 04/12/18   Armandina StammerNwoko, Agnes I, NP  busPIRone (BUSPAR) 15 MG tablet Take 1 tablet (15 mg total) by mouth 3 (three) times daily. For anxiety 04/11/18   Armandina StammerNwoko, Agnes I, NP  divalproex (DEPAKOTE) 500 MG DR tablet Take 1 tablet (500 mg total) by mouth every 12 (twelve) hours. For mood stabilization 04/11/18   Armandina StammerNwoko, Agnes I, NP  hydrOXYzine (ATARAX/VISTARIL) 50 MG tablet Take 1 tablet (50 mg total) by mouth every 6 (six) hours as needed for anxiety. 04/11/18   Armandina StammerNwoko, Agnes I, NP  nicotine (NICODERM CQ - DOSED IN MG/24 HOURS) 21 mg/24hr patch Place 1 patch (21 mg total) onto the skin daily. (May buy from over the counter): For smoking cessation 04/11/18   Armandina StammerNwoko, Agnes I, NP  nicotine polacrilex (NICORETTE) 2 MG gum Take 1 each (2 mg total) by mouth as needed  for smoking cessation. 04/11/18   Armandina Stammer I, NP  traZODone (DESYREL) 50 MG tablet Take 1 tablet (50 mg total) by mouth at bedtime as needed for sleep. 04/11/18   Sanjuana Kava, NP    Family History Family History  Problem Relation Age of Onset  . Depression Mother   . Diabetes Other   . Hyperlipidemia Other   . Hypertension Other     Social History Social History   Tobacco Use  . Smoking status: Current Every Day Smoker    Packs/day: 0.50      Years: 0.00    Pack years: 0.00    Types: Cigarettes  . Smokeless tobacco: Never Used  Substance Use Topics  . Alcohol use: Yes    Comment: minimal  . Drug use: Yes    Types: Marijuana     Allergies   Peanut-containing drug products and Lactose intolerance (gi)   Review of Systems Review of Systems  Constitutional: Negative for chills, fever and unexpected weight change.  Respiratory: Negative for shortness of breath.   Cardiovascular: Negative for chest pain.  Gastrointestinal: Negative for abdominal pain and vomiting.  Genitourinary: Negative for dysuria, frequency, hematuria and urgency.  Musculoskeletal: Positive for back pain.  Neurological: Negative for weakness and numbness.       Negative for incontinence or saddle anesthesia.      Physical Exam Updated Vital Signs BP 126/82 (BP Location: Left Arm)   Pulse 75   Temp 97.7 F (36.5 C) (Oral)   Resp 16   Ht 5\' 9"  (1.753 m)   Wt 85.7 kg   SpO2 100%   BMI 27.90 kg/m   Physical Exam  Constitutional: He appears well-developed and well-nourished. No distress.  HENT:  Head: Normocephalic and atraumatic.  Eyes: Conjunctivae are normal. Right eye exhibits no discharge. Left eye exhibits no discharge.  Cardiovascular: Normal rate and regular rhythm.  Pulmonary/Chest: Effort normal and breath sounds normal.  Abdominal: Soft. He exhibits no distension. There is no tenderness.  Musculoskeletal:  No obvious deformity, appreciable swelling, erythema, ecchymosis, warmth, or open wounds.  Back: No point/focal midline tenderness to palpation. No palpable step off. Patient has R sided thoracic paraspinal muscle tenderness to palpation.   Neurological: He is alert.  Clear speech. Sensation grossly intact to bilateral lower extremities. 5/5 symmetric strength with knee flexion/extension and ankle plantar/dorsiflexion bilaterally. Patellar DTRs are 2+ and symmetric. Ambulatory with steady gait.   Psychiatric: He has a  normal mood and affect. His behavior is normal. Thought content normal.  Nursing note and vitals reviewed.    ED Treatments / Results  Labs (all labs ordered are listed, but only abnormal results are displayed) Labs Reviewed - No data to display  EKG None  Radiology No results found.  Procedures Procedures (including critical care time)  Medications Ordered in ED Medications - No data to display   Initial Impression / Assessment and Plan / ED Course  I have reviewed the triage vital signs and the nursing notes.  Pertinent labs & imaging results that were available during my care of the patient were reviewed by me and considered in my medical decision making (see chart for details).    Patient presents with complaint of back pain x 9 months.  Patient is nontoxic appearing, vitals are WNL. Patient has normal neurologic exam, no point/focal midline tenderness to palpation. He is ambulatory in the ED.  No back pain red flags. No urinary sxs. No chest pain/dyspnea. No recent trauma. Most  likely muscular in origin. Considered disc disease, UTI/pyelonephritis, kidney stone, aortic aneurysm/dissection, pulmonary embolism, cauda equina or epidural abscess however these do not seem to fit clinical picture at this time. Will discontinue Mobic/Flexeril and trial alternative NSAID/muscle relaxant with Naproxen and Robaxin, discussed with patient that they are not to drive or operate heavy machinery while taking Robaxin. I discussed treatment plan, need for PCP follow-up, and return precautions with the patient. Provided opportunity for questions, patient confirmed understanding and is in agreement with plan.   Final Clinical Impressions(s) / ED Diagnoses   Final diagnoses:  Chronic right-sided back pain, unspecified back location    ED Discharge Orders         Ordered    naproxen (NAPROSYN) 500 MG tablet  2 times daily     05/06/18 0620    methocarbamol (ROBAXIN) 500 MG tablet  Every 8  hours PRN     05/06/18 0620           Cherly Anderson, PA-C 05/06/18 0636    Ward, Layla Maw, DO 05/06/18 [phone]

## 2018-05-06 NOTE — Discharge Instructions (Addendum)
Back Pain:  I have prescribed you an anti-inflammatory medication and a muscle relaxer.  Please stop taking the flexeril and mobic as these are similar medicines.   - Naproxen is a nonsteroidal anti-inflammatory medication that will help with pain and swelling. Be sure to take this medication as prescribed with food, 1 pill every 12 hours,  It should be taken with food, as it can cause stomach upset, and more seriously, stomach bleeding. Do not take other nonsteroidal anti-inflammatory medications with this such as Advil, Motrin, Aleve, Mobic, Goodie Powder, or Motrin.    - Robaxin is the muscle relaxer I have prescribed, this is meant to help with muscle tightness. Be aware that this medication may make you drowsy therefore the first time you take this it should be at a time you are in an environment where you can rest. Do not drive or operate heavy machinery when taking this medication. Do not drink alcohol or take other sedating medications with this medicine such as narcotics or benzodiazepines.   You make take Tylenol per over the counter dosing with these medications.   We have prescribed you new medication(s) today. Discuss the medications prescribed today with your pharmacist as they can have adverse effects and interactions with your other medicines including over the counter and prescribed medications. Seek medical evaluation if you start to experience new or abnormal symptoms after taking one of these medicines, seek care immediately if you start to experience difficulty breathing, feeling of your throat closing, facial swelling, or rash as these could be indications of a more serious allergic reaction  The application of heat can help soothe the pain.  Maintaining your daily activities, including walking, is encourged, as it will help you get better faster than just staying in bed.   You will need to follow up with  Your primary healthcare provider in 1-2 weeks for reassessment, if you do  not have a primary care provider one is provided in your discharge instructions. However if you develop severe or worsening pain, low back pain with fever, numbness, weakness, loss of bowel or bladder control, or inability to walk or urinate, you should return to the ER immediately.  Please follow up with your doctor this week for a recheck if still having symptoms.

## 2018-05-06 NOTE — ED Triage Notes (Signed)
[facility] been ongoing for the last 9 months.

## 2018-11-13 ENCOUNTER — Other Ambulatory Visit: Payer: Self-pay

## 2018-11-13 ENCOUNTER — Emergency Department (HOSPITAL_COMMUNITY): Payer: Medicaid Other

## 2018-11-13 ENCOUNTER — Encounter (HOSPITAL_COMMUNITY): Payer: Self-pay | Admitting: *Deleted

## 2018-11-13 ENCOUNTER — Emergency Department (HOSPITAL_COMMUNITY)
Admission: EM | Admit: 2018-11-13 | Discharge: 2018-11-13 | Disposition: A | Discharge status: Home / Self Care | Payer: Medicaid Other | Attending: Emergency Medicine | Admitting: Emergency Medicine

## 2018-11-13 DIAGNOSIS — R05 Cough: Secondary | ICD-10-CM | POA: Diagnosis present

## 2018-11-13 DIAGNOSIS — J45909 Unspecified asthma, uncomplicated: Secondary | ICD-10-CM | POA: Insufficient documentation

## 2018-11-13 DIAGNOSIS — J069 Acute upper respiratory infection, unspecified: Secondary | ICD-10-CM | POA: Diagnosis not present

## 2018-11-13 DIAGNOSIS — T7840XA Allergy, unspecified, initial encounter: Secondary | ICD-10-CM | POA: Diagnosis not present

## 2018-11-13 DIAGNOSIS — Z79899 Other long term (current) drug therapy: Secondary | ICD-10-CM | POA: Diagnosis not present

## 2018-11-13 DIAGNOSIS — F17210 Nicotine dependence, cigarettes, uncomplicated: Secondary | ICD-10-CM | POA: Insufficient documentation

## 2018-11-13 DIAGNOSIS — Z91010 Allergy to peanuts: Secondary | ICD-10-CM | POA: Insufficient documentation

## 2018-11-13 DIAGNOSIS — B9789 Other viral agents as the cause of diseases classified elsewhere: Secondary | ICD-10-CM

## 2018-11-13 MED ORDER — BENZONATATE 100 MG PO CAPS: 100.0000 mg | ORAL_CAPSULE | Freq: Three times a day (TID) | ORAL | 0 refills | Status: DC

## 2018-11-13 MED ORDER — CETIRIZINE HCL 10 MG PO TABS: 10.0000 mg | ORAL_TABLET | Freq: Every day | ORAL | 0 refills | Status: DC

## 2018-11-13 NOTE — ED Triage Notes (Signed)
Per [facility], pt did not want to be transported but [employer] told him he had to come.  Pt c/o cough x 1 week & stated to EMS "I told them it's a smoker's cough".

## 2018-11-13 NOTE — Discharge Instructions (Addendum)
Mr. Rosenboom x-ray does not show any signs of COVID-19. He also has not been around anyone with known coinfection or traveled anywhere endemic for the infection.  At this time we recommend that he be treated for his symptoms.  We have advised him to maintain good hygiene. Return to the ER immediately if you start having fevers, shortness of breath.

## 2018-11-13 NOTE — ED Notes (Signed)
Pt verbalized discharge instructions and follow up care. Alert and ambulatory  

## 2018-11-13 NOTE — ED Notes (Signed)
Pt ambulated to the bathroom without any assistance. Gait steady  

## 2018-11-13 NOTE — ED Provider Notes (Signed)
[facility]-EMERGENCY DEPT Provider Note   CSN: [id number] Arrival date & time: 11/13/18  0152    History   Chief Complaint Chief Complaint  Patient presents with  . Cough    x 1 week    HPI Ryan FERRELLI is a 23 y.o. male.     HPI  23 year old male comes in a chief complaint of cough for the past 1 week.  Patient states that he has had a dry cough without any shortness of breath, fevers, chills.  Patient resides at a shelter and he was advised to come to the ER for checkup. Patient denies any sick contacts.  He also denies any exposure to someone with known coronavirus infection or concerning travel history. Patient reports that around this time of the year he normally gets a dry cough because of allergies.  Past Medical History:  Diagnosis Date  . ADHD (attention deficit hyperactivity disorder)   . Arthritis   . Asthma   . Bipolar disorder (HCC)   . Depression   . Oppositional defiant disorder   . PTSD (post-traumatic stress disorder)   . Schizoaffective disorder (HCC)   . Unspecified episodic mood disorder     Patient Active Problem List   Diagnosis Date Noted  . PTSD (post-traumatic stress disorder) 04/09/2018  . Schizoaffective disorder, bipolar type (HCC) 04/08/2018  . Adjustment disorder with depressed mood 11/22/2017  . Suicidal thoughts 11/07/2016  . Bipolar 2 disorder (HCC) 08/08/2016  . Methamphetamine abuse (HCC) 03/08/2016  . Bipolar affect, depressed (HCC) 09/18/2015  . Bipolar affective disorder, currently depressed, mild (HCC) 09/17/2015  . Intentional overdose of drug in tablet form (HCC)   . Depression   . Suicidal ideation   . Cannabis use disorder, moderate, dependence (HCC) 06/06/2015  . Alcohol use disorder, moderate, dependence (HCC) 06/06/2015  . Cannabis abuse 03/30/2012  . ADHD (attention deficit hyperactivity disorder), combined type 08/28/2011  . Conduct disorder, adolescent onset type 08/28/2011    Past  Surgical History:  Procedure Laterality Date  . NO PAST SURGERIES          Home Medications    Prior to Admission medications   Medication Sig Start Date End Date Taking? Authorizing Provider  albuterol (PROVENTIL HFA;VENTOLIN HFA) 108 (90 Base) MCG/ACT inhaler Inhale 1-2 puffs into the lungs every 4 (four) hours as needed for wheezing or shortness of breath. 04/11/18   Armandina Stammer I, NP  ARIPiprazole (ABILIFY) 10 MG tablet Take 1 tablet (10 mg total) by mouth daily. For mood control 04/12/18   Armandina Stammer I, NP  benzonatate (TESSALON) 100 MG capsule Take 1 capsule (100 mg total) by mouth every 8 (eight) hours. 11/13/18   Derwood Kaplan, MD  busPIRone (BUSPAR) 15 MG tablet Take 1 tablet (15 mg total) by mouth 3 (three) times daily. For anxiety 04/11/18   Armandina Stammer I, NP  cetirizine (ZYRTEC) 10 MG tablet Take 1 tablet (10 mg total) by mouth daily. 11/13/18   Derwood Kaplan, MD  divalproex (DEPAKOTE) 500 MG DR tablet Take 1 tablet (500 mg total) by mouth every 12 (twelve) hours. For mood stabilization 04/11/18   Armandina Stammer I, NP  hydrOXYzine (ATARAX/VISTARIL) 50 MG tablet Take 1 tablet (50 mg total) by mouth every 6 (six) hours as needed for anxiety. 04/11/18   Armandina Stammer I, NP  methocarbamol (ROBAXIN) 500 MG tablet Take 1 tablet (500 mg total) by mouth every 8 (eight) hours as needed for muscle spasms. 05/06/18   Petrucelli, Pleas Koch,  PA-C  naproxen (NAPROSYN) 500 MG tablet Take 1 tablet (500 mg total) by mouth 2 (two) times daily. 05/06/18   Petrucelli, Samantha R, PA-C  nicotine (NICODERM CQ - DOSED IN MG/24 HOURS) 21 mg/24hr patch Place 1 patch (21 mg total) onto the skin daily. (May buy from over the counter): For smoking cessation 04/11/18   Armandina Stammer I, NP  nicotine polacrilex (NICORETTE) 2 MG gum Take 1 each (2 mg total) by mouth as needed for smoking cessation. 04/11/18   Armandina Stammer I, NP  traZODone (DESYREL) 50 MG tablet Take 1 tablet (50 mg total) by mouth at bedtime as needed  for sleep. 04/11/18   Sanjuana Kava, NP    Family History Family History  Problem Relation Age of Onset  . Depression Mother   . Diabetes Other   . Hyperlipidemia Other   . Hypertension Other     Social History Social History   Tobacco Use  . Smoking status: Current Every Day Smoker    Packs/day: 0.50    Years: 0.00    Pack years: 0.00    Types: Cigarettes  . Smokeless tobacco: Never Used  Substance Use Topics  . Alcohol use: Not Currently    Comment: minimal  . Drug use: Yes    Types: Marijuana     Allergies   Peanut-containing drug products and Lactose intolerance (gi)   Review of Systems Review of Systems  Constitutional: Negative for activity change, chills, fatigue and fever.  Eyes: Negative for visual disturbance.  Respiratory: Positive for cough. Negative for chest tightness, shortness of breath and wheezing.   Cardiovascular: Negative for chest pain.  Gastrointestinal: Negative for abdominal distention.  Musculoskeletal: Negative for arthralgias and neck pain.  Allergic/Immunologic: Negative for immunocompromised state.  Neurological: Positive for dizziness. Negative for light-headedness and headaches.  Psychiatric/Behavioral: Negative for confusion.     Physical Exam Updated Vital Signs BP (!) 145/78 (BP Location: Left Arm)   Pulse 70   Temp 97.9 F (36.6 C) (Oral)   Resp 18   Ht 5\' 9"  (1.753 m)   Wt 99.8 kg   SpO2 99%   BMI 32.49 kg/m   Physical Exam Vitals signs and nursing note reviewed.  Constitutional:      Appearance: He is well-developed.  HENT:     Head: Atraumatic.  Neck:     Musculoskeletal: Neck supple.  Cardiovascular:     Rate and Rhythm: Normal rate.  Pulmonary:     Effort: Pulmonary effort is normal.     Breath sounds: No wheezing.  Skin:    General: Skin is warm.  Neurological:     Mental Status: He is alert and oriented to person, place, and time.      ED Treatments / Results  Labs (all labs ordered are  listed, but only abnormal results are displayed) Labs Reviewed - No data to display  EKG None  Radiology Dg Chest [facility] 1 View  Result Date: 11/13/2018 CLINICAL DATA:  Dry cough EXAM: PORTABLE CHEST 1 VIEW COMPARISON:  06/30/2016 FINDINGS: The heart size and mediastinal contours are within normal limits. Both lungs are clear. The visualized skeletal structures are unremarkable. IMPRESSION: No active disease. Electronically Signed   By: Deatra Robinson M.D.   On: 11/13/2018 03:24    Procedures Procedures (including critical care time)  Medications Ordered in ED Medications - No data to display   Initial Impression / Assessment and Plan / ED Course  I have reviewed the triage vital signs  and the nursing notes.  Pertinent labs & imaging results that were available during my care of the patient were reviewed by me and considered in my medical decision making (see chart for details).        23 year old comes in a chief complaint of cough. Patient has had a cough for at least a week now.  He is denying any shortness of breath, fevers, flulike symptoms.  He reports that he has had similar symptoms in the past at this time of the year because of pollen allergies.  Lung exam is clear.  Chest x-ray does not show any signs are concerning for COVID-19.  He was sent here by his shelter because of his symptoms.  Given that the symptoms have been present for 7 days and they are not progressing, there is no high risk features to the symptoms themselves nor is there any high risk exposures -and there is another rash vaccination for his symptoms we will not test him for COVID-19.  He has been advised to return to the ER if his symptoms get worse and in the meantime perform good hygiene.  Final Clinical Impressions(s) / ED Diagnoses   Final diagnoses:  Viral URI with cough    ED Discharge Orders         Ordered    cetirizine (ZYRTEC) 10 MG tablet  Daily     11/13/18 0343    benzonatate  (TESSALON) 100 MG capsule  Every 8 hours     11/13/18 0343           Derwood Kaplan, MD 11/13/18 [phone]

## 2018-11-13 NOTE — ED Notes (Signed)
Bed: WA09 Expected date:  Expected time:  Means of arrival:  Comments: 

## 2018-11-13 NOTE — ED Notes (Addendum)
Pt stated "I vomited once when they were making rounds.  They told me I had to come and to call them when I got through.  I told them all I needed was air.  I eat a lot and may be it was too much."  LS clear.

## 2018-11-13 NOTE — ED Notes (Signed)
XR at bedside

## 2018-12-14 ENCOUNTER — Emergency Department (HOSPITAL_COMMUNITY): Payer: Medicaid Other

## 2018-12-14 ENCOUNTER — Other Ambulatory Visit: Payer: Self-pay

## 2018-12-14 ENCOUNTER — Emergency Department (HOSPITAL_COMMUNITY)
Admission: EM | Admit: 2018-12-14 | Discharge: 2018-12-14 | Disposition: A | Discharge status: Home / Self Care | Payer: Medicaid Other | Attending: Emergency Medicine | Admitting: Emergency Medicine

## 2018-12-14 ENCOUNTER — Encounter (HOSPITAL_COMMUNITY): Payer: Self-pay | Admitting: Emergency Medicine

## 2018-12-14 DIAGNOSIS — F909 Attention-deficit hyperactivity disorder, unspecified type: Secondary | ICD-10-CM | POA: Insufficient documentation

## 2018-12-14 DIAGNOSIS — Z8709 Personal history of other diseases of the respiratory system: Secondary | ICD-10-CM | POA: Diagnosis not present

## 2018-12-14 DIAGNOSIS — R0789 Other chest pain: Secondary | ICD-10-CM

## 2018-12-14 DIAGNOSIS — F1290 Cannabis use, unspecified, uncomplicated: Secondary | ICD-10-CM | POA: Diagnosis not present

## 2018-12-14 DIAGNOSIS — R110 Nausea: Secondary | ICD-10-CM | POA: Insufficient documentation

## 2018-12-14 DIAGNOSIS — F250 Schizoaffective disorder, bipolar type: Secondary | ICD-10-CM | POA: Insufficient documentation

## 2018-12-14 DIAGNOSIS — F17210 Nicotine dependence, cigarettes, uncomplicated: Secondary | ICD-10-CM | POA: Diagnosis not present

## 2018-12-14 LAB — COMPREHENSIVE METABOLIC PANEL
ALT: 21 U/L (ref 0–44)
AST: 16 U/L (ref 15–41)
Albumin: 3.7 g/dL (ref 3.5–5.0)
Alkaline Phosphatase: 50 U/L (ref 38–126)
Anion gap: 11 (ref 5–15)
BUN: 15 mg/dL (ref 6–20)
CO2: 21 mmol/L — ABNORMAL LOW (ref 22–32)
Calcium: 9.1 mg/dL (ref 8.9–10.3)
Chloride: 107 mmol/L (ref 98–111)
Creatinine, Ser: 1.25 mg/dL — ABNORMAL HIGH (ref 0.61–1.24)
GFR calc Af Amer: >60 mL/min (ref >60)
GFR calc non Af Amer: >60 mL/min (ref >60)
Glucose, Bld: 105 mg/dL — ABNORMAL HIGH (ref 70–99)
Potassium: 3.8 mmol/L (ref 3.5–5.1)
Sodium: 139 mmol/L (ref 135–145)
Total Bilirubin: 0.5 mg/dL (ref 0.3–1.2)
Total Protein: 6.4 g/dL — ABNORMAL LOW (ref 6.5–8.1)

## 2018-12-14 LAB — CBC WITH DIFFERENTIAL/PLATELET
Abs Immature Granulocytes: 0.03 10*3/uL (ref 0.00–0.07)
Basophils Absolute: 0.1 10*3/uL (ref 0.0–0.1)
Basophils Relative: 1 %
Eosinophils Absolute: 0.3 10*3/uL (ref 0.0–0.5)
Eosinophils Relative: 3 %
HCT: 44.2 % (ref 39.0–52.0)
Hemoglobin: 14.3 g/dL (ref 13.0–17.0)
Immature Granulocytes: 0 %
Lymphocytes Relative: 28 %
Lymphs Abs: 2.6 10*3/uL (ref 0.7–4.0)
MCH: 28.5 pg (ref 26.0–34.0)
MCHC: 32.4 g/dL (ref 30.0–36.0)
MCV: 88.2 fL (ref 80.0–100.0)
Monocytes Absolute: 0.7 10*3/uL (ref 0.1–1.0)
Monocytes Relative: 8 %
Neutro Abs: 5.7 10*3/uL (ref 1.7–7.7)
Neutrophils Relative %: 60 %
Platelets: 193 10*3/uL (ref 150–400)
RBC: 5.01 MIL/uL (ref 4.22–5.81)
RDW: 13.6 % (ref 11.5–15.5)
WBC: 9.3 10*3/uL (ref 4.0–10.5)
nRBC: 0 % (ref 0.0–0.2)

## 2018-12-14 LAB — D-DIMER, QUANTITATIVE: D-Dimer, Quant: <0.27 ug/mL-FEU (ref 0.00–0.50)

## 2018-12-14 LAB — TROPONIN I
Troponin I: <0.03 ng/mL (ref <0.03)
Troponin I: <0.03 ng/mL (ref <0.03)

## 2018-12-14 LAB — LIPASE, BLOOD: Lipase: 36 U/L (ref 11–51)

## 2018-12-14 MED ORDER — LIDOCAINE VISCOUS HCL 2 % MT SOLN
15.0000 mL | Freq: Once | OROMUCOSAL | Status: AC
  Administered 2018-12-14: 15 mL via ORAL
  Filled 2018-12-14: qty 15

## 2018-12-14 MED ORDER — ALUM & MAG HYDROXIDE-SIMETH 200-200-20 MG/5ML PO SUSP
30.0000 mL | Freq: Once | ORAL | Status: AC
  Administered 2018-12-14: 30 mL via ORAL
  Filled 2018-12-14: qty 30

## 2018-12-14 MED ORDER — OMEPRAZOLE 20 MG PO CPDR: 20.0000 mg | DELAYED_RELEASE_CAPSULE | Freq: Every day | ORAL | 0 refills | Status: DC

## 2018-12-14 NOTE — Discharge Instructions (Addendum)
There is no evidence of heart attack or blood clot in the lung.  Take the stomach medication as prescribed.  Avoid alcohol, NSAIDs, caffeine, spicy foods.  Follow-up with your doctor.  Return to the ED if you develop new or worsening symptoms.

## 2018-12-14 NOTE — ED Triage Notes (Signed)
Patient here with chest pain that woke him up from sleep, with central chest pain and radiating to his back.  Patient was given 324mg  ASA, refused nitro.  Patient with some nausea, relieved with 4mg  Zofran.  NSR on monitor.

## 2018-12-14 NOTE — ED Provider Notes (Signed)
[facility] EMERGENCY DEPARTMENT Provider Note   CSN: [id number] Arrival date & time: 12/14/18  0134    History   Chief Complaint Chief Complaint  Patient presents with  . Chest Pain    HPI Ryan Bartlett is a 23 y.o. male.     Patient with history of schizoaffective disorder and bipolar disorder presenting with central chest pain that woke him from sleep about 12:30 AM.  States he felt well when he went to bed.  The pain is constant in the center of his chest and radiates to his back.  There is associated nausea but no vomiting.  States aspirin given by EMS did help some.  He is never had this kind of pain before.  He denies any cardiac history.  He denies any shortness of breath, vomiting, diaphoresis, abdominal pain.  The pain is in the center of his chest and goes to his back.  Denies any cardiac history.  Denies any history of ulcers or acid reflux.  States he felt well when he went to bed.  Denies any recent alcohol, cocaine or other drug use.  The history is provided by the EMS personnel and the patient.  Chest Pain  Associated symptoms: nausea   Associated symptoms: no fever, no headache, no shortness of breath, no vomiting and no weakness     Past Medical History:  Diagnosis Date  . ADHD (attention deficit hyperactivity disorder)   . Arthritis   . Asthma   . Bipolar disorder (HCC)   . Depression   . Oppositional defiant disorder   . PTSD (post-traumatic stress disorder)   . Schizoaffective disorder (HCC)   . Unspecified episodic mood disorder     Patient Active Problem List   Diagnosis Date Noted  . PTSD (post-traumatic stress disorder) 04/09/2018  . Schizoaffective disorder, bipolar type (HCC) 04/08/2018  . Adjustment disorder with depressed mood 11/22/2017  . Suicidal thoughts 11/07/2016  . Bipolar 2 disorder (HCC) 08/08/2016  . Methamphetamine abuse (HCC) 03/08/2016  . Bipolar affect, depressed (HCC) 09/18/2015  . Bipolar affective  disorder, currently depressed, mild (HCC) 09/17/2015  . Intentional overdose of drug in tablet form (HCC)   . Depression   . Suicidal ideation   . Cannabis use disorder, moderate, dependence (HCC) 06/06/2015  . Alcohol use disorder, moderate, dependence (HCC) 06/06/2015  . Cannabis abuse 03/30/2012  . ADHD (attention deficit hyperactivity disorder), combined type 08/28/2011  . Conduct disorder, adolescent onset type 08/28/2011    Past Surgical History:  Procedure Laterality Date  . NO PAST SURGERIES          Home Medications    Prior to Admission medications   Medication Sig Start Date End Date Taking? Authorizing Provider  albuterol (PROVENTIL HFA;VENTOLIN HFA) 108 (90 Base) MCG/ACT inhaler Inhale 1-2 puffs into the lungs every 4 (four) hours as needed for wheezing or shortness of breath. 04/11/18   Armandina Stammer I, NP  ARIPiprazole (ABILIFY) 10 MG tablet Take 1 tablet (10 mg total) by mouth daily. For mood control 04/12/18   Armandina Stammer I, NP  benzonatate (TESSALON) 100 MG capsule Take 1 capsule (100 mg total) by mouth every 8 (eight) hours. 11/13/18   Derwood Kaplan, MD  busPIRone (BUSPAR) 15 MG tablet Take 1 tablet (15 mg total) by mouth 3 (three) times daily. For anxiety 04/11/18   Armandina Stammer I, NP  cetirizine (ZYRTEC) 10 MG tablet Take 1 tablet (10 mg total) by mouth daily. 11/13/18   Derwood Kaplan, MD  divalproex (DEPAKOTE) 500 MG DR tablet Take 1 tablet (500 mg total) by mouth every 12 (twelve) hours. For mood stabilization 04/11/18   Armandina Stammer I, NP  hydrOXYzine (ATARAX/VISTARIL) 50 MG tablet Take 1 tablet (50 mg total) by mouth every 6 (six) hours as needed for anxiety. 04/11/18   Armandina Stammer I, NP  methocarbamol (ROBAXIN) 500 MG tablet Take 1 tablet (500 mg total) by mouth every 8 (eight) hours as needed for muscle spasms. 05/06/18   Petrucelli, Samantha R, PA-C  naproxen (NAPROSYN) 500 MG tablet Take 1 tablet (500 mg total) by mouth 2 (two) times daily. 05/06/18    Petrucelli, Samantha R, PA-C  nicotine (NICODERM CQ - DOSED IN MG/24 HOURS) 21 mg/24hr patch Place 1 patch (21 mg total) onto the skin daily. (May buy from over the counter): For smoking cessation 04/11/18   Armandina Stammer I, NP  nicotine polacrilex (NICORETTE) 2 MG gum Take 1 each (2 mg total) by mouth as needed for smoking cessation. 04/11/18   Armandina Stammer I, NP  traZODone (DESYREL) 50 MG tablet Take 1 tablet (50 mg total) by mouth at bedtime as needed for sleep. 04/11/18   Sanjuana Kava, NP    Family History Family History  Problem Relation Age of Onset  . Depression Mother   . Diabetes Other   . Hyperlipidemia Other   . Hypertension Other     Social History Social History   Tobacco Use  . Smoking status: Current Every Day Smoker    Packs/day: 0.50    Years: 0.00    Pack years: 0.00    Types: Cigarettes  . Smokeless tobacco: Never Used  Substance Use Topics  . Alcohol use: Not Currently    Comment: minimal  . Drug use: Yes    Types: Marijuana     Allergies   Peanut-containing drug products and Lactose intolerance (gi)   Review of Systems Review of Systems  Constitutional: Negative for activity change, appetite change and fever.  HENT: Negative for congestion and rhinorrhea.   Respiratory: Positive for chest tightness. Negative for shortness of breath.   Cardiovascular: Positive for chest pain.  Gastrointestinal: Positive for nausea. Negative for vomiting.  Genitourinary: Negative for dysuria and hematuria.  Musculoskeletal: Negative for arthralgias and myalgias.  Skin: Negative for rash.  Neurological: Negative for weakness and headaches.    all other systems are negative except as noted in the HPI and PMH.    Physical Exam Updated Vital Signs BP 124/81 (BP Location: Right Arm)   Pulse 68   Temp 98 F (36.7 C) (Oral)   Resp 18   Ht 5\' 9"  (1.753 m)   Wt 95.3 kg   SpO2 99%   BMI 31.01 kg/m   Physical Exam Vitals signs and nursing note reviewed.   Constitutional:      General: He is not in acute distress.    Appearance: He is well-developed.  HENT:     Head: Normocephalic and atraumatic.     Mouth/Throat:     Pharynx: No oropharyngeal exudate.  Eyes:     Conjunctiva/sclera: Conjunctivae normal.     Pupils: Pupils are equal, round, and reactive to light.  Neck:     Musculoskeletal: Normal range of motion and neck supple.     Comments: No meningismus. Cardiovascular:     Rate and Rhythm: Normal rate and regular rhythm.     Heart sounds: Normal heart sounds. No murmur.     Comments: Equal femoral pulses bilaterally chest  pain Pulmonary:     Effort: Pulmonary effort is normal. No respiratory distress.     Breath sounds: Normal breath sounds.  Chest:     Chest wall: No tenderness.  Abdominal:     Palpations: Abdomen is soft.     Tenderness: There is no abdominal tenderness. There is no guarding or rebound.  Musculoskeletal: Normal range of motion.        General: No tenderness.  Skin:    General: Skin is warm.     Capillary Refill: Capillary refill takes less than 2 seconds.  Neurological:     General: No focal deficit present.     Mental Status: He is alert and oriented to person, place, and time. Mental status is at baseline.     Cranial Nerves: No cranial nerve deficit.     Motor: No abnormal muscle tone.     Coordination: Coordination normal.     Comments: No ataxia on finger to nose bilaterally. No pronator drift. 5/5 strength throughout. CN 2-12 intact.Equal grip strength. Sensation intact.   Psychiatric:        Behavior: Behavior normal.      ED Treatments / Results  Labs (all labs ordered are listed, but only abnormal results are displayed) Labs Reviewed  COMPREHENSIVE METABOLIC PANEL - Abnormal; Notable for the following components:      Result Value   CO2 21 (*)    Glucose, Bld 105 (*)    Creatinine, Ser 1.25 (*)    Total Protein 6.4 (*)    All other components within normal limits  CBC WITH  DIFFERENTIAL/PLATELET  TROPONIN I  D-DIMER, QUANTITATIVE (NOT AT [facility])  LIPASE, BLOOD  TROPONIN I    EKG EKG Interpretation  Date/Time:  Sunday December 14 2018 01:43:21 EDT Ventricular Rate:  62 PR Interval:    QRS Duration: 94 QT Interval:  400 QTC Calculation: 407 R Axis:   24 Text Interpretation:  Sinus rhythm RSR' in V1 or V2, probably normal variant Borderline T abnormalities, inferior leads Borderline ST elevation, anterior leads No significant change was found similar to 2018 Confirmed by Glynn Octave [phone]) on 12/14/2018 1:54:52 AM   Radiology Dg Chest 2 View  Result Date: 12/14/2018 CLINICAL DATA:  Chest pain EXAM: CHEST - 2 VIEW COMPARISON:  11/13/2018 FINDINGS: The heart size and mediastinal contours are within normal limits. Both lungs are clear. The visualized skeletal structures are unremarkable. IMPRESSION: No active cardiopulmonary disease. Electronically Signed   By: Deatra Robinson M.D.   On: 12/14/2018 02:37    Procedures Procedures (including critical care time)  Medications Ordered in ED Medications  alum & mag hydroxide-simeth (MAALOX/MYLANTA) 200-200-20 MG/5ML suspension 30 mL (has no administration in time range)    And  lidocaine (XYLOCAINE) 2 % viscous mouth solution 15 mL (has no administration in time range)     Initial Impression / Assessment and Plan / ED Course  I have reviewed the triage vital signs and the nursing notes.  Pertinent labs & imaging results that were available during my care of the patient were reviewed by me and considered in my medical decision making (see chart for details).       2 hours of central chest pain that radiates to the back associate with nausea that woke from sleep.  EKG is abnormal with J-point elevation anteriorly and T wave inversion in 3 and aVF but this is unchanged.  Patient had complete relief of symptoms with p.o. Maalox and viscous lidocaine.  His labs  are reassuring with negative troponin and  normal LFTs and lipase.  Low suspicion for ACS or PE.  Chest x-ray is negative.  Patient remains comfortable without recurrence of symptoms.  Troponin negative x2.  D-dimer negative.  Suspect GI etiology of his chest pain.  Will start PPI.  Advised to avoid alcohol, NSAIDs, caffeine, spicy foods.  Patient states that he did have a siracha last night. Follow-up with PCP.  Return precautions discussed. Final Clinical Impressions(s) / ED Diagnoses   Final diagnoses:  Atypical chest pain    ED Discharge Orders    None       Hatsumi Steinhart, Jeannett SeniorStephen, MD 12/14/18 [phone]

## 2019-01-27 ENCOUNTER — Other Ambulatory Visit: Payer: Self-pay | Admitting: *Deleted

## 2019-01-27 DIAGNOSIS — Z20822 Contact with and (suspected) exposure to covid-19: Secondary | ICD-10-CM

## 2019-01-27 NOTE — Progress Notes (Signed)
lab

## 2019-01-28 ENCOUNTER — Encounter (HOSPITAL_COMMUNITY): Payer: Self-pay | Admitting: Emergency Medicine

## 2019-01-28 ENCOUNTER — Emergency Department (HOSPITAL_COMMUNITY)
Admission: EM | Admit: 2019-01-28 | Discharge: 2019-01-28 | Disposition: A | Discharge status: Home / Self Care | Payer: Medicaid Other | Attending: Emergency Medicine | Admitting: Emergency Medicine

## 2019-01-28 DIAGNOSIS — F17210 Nicotine dependence, cigarettes, uncomplicated: Secondary | ICD-10-CM | POA: Diagnosis not present

## 2019-01-28 DIAGNOSIS — J45909 Unspecified asthma, uncomplicated: Secondary | ICD-10-CM | POA: Diagnosis not present

## 2019-01-28 DIAGNOSIS — H5789 Other specified disorders of eye and adnexa: Secondary | ICD-10-CM | POA: Diagnosis present

## 2019-01-28 DIAGNOSIS — Z79899 Other long term (current) drug therapy: Secondary | ICD-10-CM | POA: Insufficient documentation

## 2019-01-28 DIAGNOSIS — H1033 Unspecified acute conjunctivitis, bilateral: Secondary | ICD-10-CM | POA: Diagnosis not present

## 2019-01-28 DIAGNOSIS — Z91010 Allergy to peanuts: Secondary | ICD-10-CM | POA: Diagnosis not present

## 2019-01-28 MED ORDER — TETRACAINE HCL 0.5 % OP SOLN
2.0000 [drp] | Freq: Once | OPHTHALMIC | Status: AC
  Administered 2019-01-28: 2 [drp] via OPHTHALMIC
  Filled 2019-01-28: qty 4

## 2019-01-28 MED ORDER — CIPROFLOXACIN HCL 0.3 % OP SOLN
2.0000 [drp] | OPHTHALMIC | Status: DC
  Administered 2019-01-28: 2 [drp] via OPHTHALMIC
  Filled 2019-01-28: qty 2.5

## 2019-01-28 MED ORDER — FLUORESCEIN SODIUM 1 MG OP STRP
1.0000 | ORAL_STRIP | Freq: Once | OPHTHALMIC | Status: AC
  Administered 2019-01-28: 1 via OPHTHALMIC
  Filled 2019-01-28: qty 1

## 2019-01-28 NOTE — ED Notes (Signed)
Bed: WA05 Expected date:  Expected time:  Means of arrival:  Comments: 

## 2019-01-28 NOTE — ED Triage Notes (Signed)
Patient is having redness, itching of both eyes. Patient left eye is swollen. Patient was brought in by [facility].

## 2019-01-28 NOTE — ED Provider Notes (Addendum)
[facility]-EMERGENCY DEPT Provider Note: Lowella DellJ. Lane Giordan Fordham, MD, FACEP  CSN: [id number] MRN: [id number] ARRIVAL: 01/28/19 at 2232 ROOM: WA05/WA05   CHIEF COMPLAINT  Eye Pain   HISTORY OF PRESENT ILLNESS  01/28/19 11:01 PM Ryan Bartlett is a 23 y.o. male with a history of eye irritation, pain, erythema and serous discharge for the past 4 days.  This started in his left eye and spread to his right eye.  His vision is somewhat blurred.  He has mild photophobia.  He rates his pain currently as a 10 out of 10.  He denies injury to his eyes.   Past Medical History:  Diagnosis Date  . ADHD (attention deficit hyperactivity disorder)   . Arthritis   . Asthma   . Bipolar disorder (HCC)   . Depression   . Oppositional defiant disorder   . PTSD (post-traumatic stress disorder)   . Schizoaffective disorder (HCC)   . Unspecified episodic mood disorder     Past Surgical History:  Procedure Laterality Date  . NO PAST SURGERIES      Family History  Problem Relation Age of Onset  . Depression Mother   . Diabetes Other   . Hyperlipidemia Other   . Hypertension Other     Social History   Tobacco Use  . Smoking status: Current Every Day Smoker    Packs/day: 0.50    Years: 0.00    Pack years: 0.00    Types: Cigarettes  . Smokeless tobacco: Never Used  Substance Use Topics  . Alcohol use: Not Currently    Comment: minimal  . Drug use: Yes    Types: Marijuana    Prior to Admission medications   Medication Sig Start Date End Date Taking? Authorizing Provider  albuterol (PROVENTIL HFA;VENTOLIN HFA) 108 (90 Base) MCG/ACT inhaler Inhale 1-2 puffs into the lungs every 4 (four) hours as needed for wheezing or shortness of breath. 04/11/18   Armandina StammerNwoko, Agnes I, NP  ARIPiprazole (ABILIFY) 10 MG tablet Take 1 tablet (10 mg total) by mouth daily. For mood control 04/12/18   Armandina StammerNwoko, Agnes I, NP  benzonatate (TESSALON) 100 MG capsule Take 1 capsule (100 mg total) by mouth every 8 (eight) hours. 11/13/18    Derwood KaplanNanavati, Ankit, MD  busPIRone (BUSPAR) 15 MG tablet Take 1 tablet (15 mg total) by mouth 3 (three) times daily. For anxiety 04/11/18   Armandina StammerNwoko, Agnes I, NP  cetirizine (ZYRTEC) 10 MG tablet Take 1 tablet (10 mg total) by mouth daily. 11/13/18   Derwood KaplanNanavati, Ankit, MD  divalproex (DEPAKOTE) 500 MG DR tablet Take 1 tablet (500 mg total) by mouth every 12 (twelve) hours. For mood stabilization 04/11/18   Armandina StammerNwoko, Agnes I, NP  hydrOXYzine (ATARAX/VISTARIL) 50 MG tablet Take 1 tablet (50 mg total) by mouth every 6 (six) hours as needed for anxiety. 04/11/18   Armandina StammerNwoko, Agnes I, NP  methocarbamol (ROBAXIN) 500 MG tablet Take 1 tablet (500 mg total) by mouth every 8 (eight) hours as needed for muscle spasms. 05/06/18   Petrucelli, Samantha R, PA-C  naproxen (NAPROSYN) 500 MG tablet Take 1 tablet (500 mg total) by mouth 2 (two) times daily. 05/06/18   Petrucelli, Samantha R, PA-C  nicotine (NICODERM CQ - DOSED IN MG/24 HOURS) 21 mg/24hr patch Place 1 patch (21 mg total) onto the skin daily. (May buy from over the counter): For smoking cessation 04/11/18   Armandina StammerNwoko, Agnes I, NP  nicotine polacrilex (NICORETTE) 2 MG gum Take 1 each (2 mg total) by mouth as  needed for smoking cessation. 04/11/18   Lindell Spar I, NP  omeprazole (PRILOSEC) 20 MG capsule Take 1 capsule (20 mg total) by mouth daily. 12/14/18   Rancour, Annie Main, MD  traZODone (DESYREL) 50 MG tablet Take 1 tablet (50 mg total) by mouth at bedtime as needed for sleep. 04/11/18   Lindell Spar I, NP    Allergies Peanut-containing drug products and Lactose intolerance (gi)   REVIEW OF SYSTEMS  Negative except as noted here or in the History of Present Illness.   PHYSICAL EXAMINATION  Initial Vital Signs Blood pressure (!) 122/97, pulse 84, temperature 98.6 F (37 C), temperature source Oral, resp. rate 18, height 5\' 9"  (1.753 m), weight 95.3 kg, SpO2 100 %.  Examination General: Well-developed, well-nourished male in no acute distress; appearance consistent with  age of record HENT: normocephalic; atraumatic Eyes: pupils equal, round and reactive to light; extraocular muscles intact; bilateral conjunctival injection with serous exudate; no hyphema; no hypopyon; globes soft; no fluorescein uptake; pain relieved with topical tetracaine Neck: supple Heart: regular rate and rhythm Lungs: clear to auscultation bilaterally Abdomen: soft; nondistended; bowel sounds present Extremities: No deformity; full range of motion; pulses normal Neurologic: Awake, alert and oriented; motor function intact in all extremities and symmetric; no facial droop Skin: Warm and dry Psychiatric: Normal mood and affect   RESULTS  Summary of this visit's results, reviewed by myself:   EKG Interpretation  Date/Time:    Ventricular Rate:    PR Interval:    QRS Duration:   QT Interval:    QTC Calculation:   R Axis:     Text Interpretation:        Laboratory Studies: No results found for this or any previous visit (from the past 24 hour(s)). Imaging Studies: No results found.  ED COURSE and MDM  Nursing notes and initial vitals signs, including pulse oximetry, reviewed.  Vitals:   01/28/19 2242 01/28/19 2243  BP: (!) 122/97   Pulse: 84   Resp: 18   Temp: 98.6 F (37 C)   TempSrc: Oral   SpO2: 100%   Weight:  95.3 kg  Height:  5\' 9"  (1.753 m)   Patient's examination and history are consistent with bilateral conjunctivitis, likely viral.  We will treat with drops.  No evidence of acute angle-closure glaucoma.  No hypopyon.  We will have him follow-up with ophthalmology tomorrow for a full exam.  PROCEDURES    ED DIAGNOSES     ICD-10-CM   1. Acute conjunctivitis of both eyes, unspecified acute conjunctivitis type H10.33        Barbara Keng, MD 01/28/19 2329    Shanon Rosser, MD 01/28/19 2333

## 2019-01-29 LAB — NOVEL CORONAVIRUS, NAA: SARS-CoV-2, NAA: NOT DETECTED

## 2019-07-04 ENCOUNTER — Emergency Department (HOSPITAL_COMMUNITY)
Admission: EM | Admit: 2019-07-04 | Discharge: 2019-07-04 | Disposition: A | Discharge status: Home / Self Care | Payer: Medicaid Other | Attending: Emergency Medicine | Admitting: Emergency Medicine

## 2019-07-04 ENCOUNTER — Encounter (HOSPITAL_COMMUNITY): Payer: Self-pay | Admitting: Emergency Medicine

## 2019-07-04 ENCOUNTER — Other Ambulatory Visit: Payer: Self-pay

## 2019-07-04 DIAGNOSIS — Z79899 Other long term (current) drug therapy: Secondary | ICD-10-CM | POA: Diagnosis not present

## 2019-07-04 DIAGNOSIS — F17210 Nicotine dependence, cigarettes, uncomplicated: Secondary | ICD-10-CM | POA: Diagnosis not present

## 2019-07-04 DIAGNOSIS — Z91010 Allergy to peanuts: Secondary | ICD-10-CM | POA: Diagnosis not present

## 2019-07-04 DIAGNOSIS — J45909 Unspecified asthma, uncomplicated: Secondary | ICD-10-CM | POA: Diagnosis not present

## 2019-07-04 DIAGNOSIS — G8929 Other chronic pain: Secondary | ICD-10-CM | POA: Diagnosis not present

## 2019-07-04 DIAGNOSIS — M549 Dorsalgia, unspecified: Secondary | ICD-10-CM | POA: Diagnosis present

## 2019-07-04 DIAGNOSIS — M546 Pain in thoracic spine: Secondary | ICD-10-CM | POA: Diagnosis not present

## 2019-07-04 MED ORDER — IBUPROFEN 800 MG PO TABS
800.0000 mg | ORAL_TABLET | Freq: Once | ORAL | Status: AC
  Administered 2019-07-04: 800 mg via ORAL
  Filled 2019-07-04: qty 1

## 2019-07-04 NOTE — Discharge Instructions (Addendum)
Take Motrin and Tylenol as needed as directed.  You can also apply warm compresses to your back for 30 minutes at a time followed by gentle stretching.  Follow-up with a primary care provider, referrals been given to [facility] health and wellness if needed.

## 2019-07-04 NOTE — ED Triage Notes (Signed)
Per EMS - Pt was walking to friends house and suddenly starting having sharp pain in the center of his back. No injury. No hx of back pain. States pain in sharp but does not radiate anywhere and no numbness or tingling. Pt states he felt pain was bad enough to call EMS. Pt ambulated independently with steady gait from EMS bay to pt room.    140 98 84 HR 98% RA 16 R

## 2019-07-04 NOTE — ED Provider Notes (Signed)
[facility]-EMERGENCY DEPT Provider Note   CSN: [id number] Arrival date & time: 07/04/19  0414     History   Chief Complaint Chief Complaint  Patient presents with  . Back Pain    HPI Ryan Bartlett is a 23 y.o. male.     23 year old male presents with complaint of pain in his back along his right scapula.  Patient states that he was walking down the street tonight when he had a sudden onset of sharp pain in this area, states that he has had pain like this in the past but usually only occurs at night when he is sleeping for the past several years.  Patient denies any falls, injuries, other recent trauma.  No other complaints or concerns tonight.  Patient has not taken anything for his pain rather called an ambulance and came to the emergency room.     Past Medical History:  Diagnosis Date  . ADHD (attention deficit hyperactivity disorder)   . Arthritis   . Asthma   . Bipolar disorder (HCC)   . Depression   . Oppositional defiant disorder   . PTSD (post-traumatic stress disorder)   . Schizoaffective disorder (HCC)   . Unspecified episodic mood disorder     Patient Active Problem List   Diagnosis Date Noted  . PTSD (post-traumatic stress disorder) 04/09/2018  . Schizoaffective disorder, bipolar type (HCC) 04/08/2018  . Adjustment disorder with depressed mood 11/22/2017  . Suicidal thoughts 11/07/2016  . Bipolar 2 disorder (HCC) 08/08/2016  . Methamphetamine abuse (HCC) 03/08/2016  . Bipolar affect, depressed (HCC) 09/18/2015  . Bipolar affective disorder, currently depressed, mild (HCC) 09/17/2015  . Intentional overdose of drug in tablet form (HCC)   . Depression   . Suicidal ideation   . Cannabis use disorder, moderate, dependence (HCC) 06/06/2015  . Alcohol use disorder, moderate, dependence (HCC) 06/06/2015  . Cannabis abuse 03/30/2012  . ADHD (attention deficit hyperactivity disorder), combined type 08/28/2011  . Conduct disorder,  adolescent onset type 08/28/2011    Past Surgical History:  Procedure Laterality Date  . NO PAST SURGERIES          Home Medications    Prior to Admission medications   Medication Sig Start Date End Date Taking? Authorizing Provider  albuterol (PROVENTIL HFA;VENTOLIN HFA) 108 (90 Base) MCG/ACT inhaler Inhale 1-2 puffs into the lungs every 4 (four) hours as needed for wheezing or shortness of breath. 04/11/18   Armandina Stammer I, NP  ARIPiprazole (ABILIFY) 10 MG tablet Take 1 tablet (10 mg total) by mouth daily. For mood control 04/12/18   Armandina Stammer I, NP  benzonatate (TESSALON) 100 MG capsule Take 1 capsule (100 mg total) by mouth every 8 (eight) hours. 11/13/18   Derwood Kaplan, MD  busPIRone (BUSPAR) 15 MG tablet Take 1 tablet (15 mg total) by mouth 3 (three) times daily. For anxiety 04/11/18   Armandina Stammer I, NP  cetirizine (ZYRTEC) 10 MG tablet Take 1 tablet (10 mg total) by mouth daily. 11/13/18   Derwood Kaplan, MD  divalproex (DEPAKOTE) 500 MG DR tablet Take 1 tablet (500 mg total) by mouth every 12 (twelve) hours. For mood stabilization 04/11/18   Armandina Stammer I, NP  hydrOXYzine (ATARAX/VISTARIL) 50 MG tablet Take 1 tablet (50 mg total) by mouth every 6 (six) hours as needed for anxiety. 04/11/18   Armandina Stammer I, NP  methocarbamol (ROBAXIN) 500 MG tablet Take 1 tablet (500 mg total) by mouth every 8 (eight) hours as needed for  muscle spasms. 05/06/18   Petrucelli, Samantha R, PA-C  naproxen (NAPROSYN) 500 MG tablet Take 1 tablet (500 mg total) by mouth 2 (two) times daily. 05/06/18   Petrucelli, Samantha R, PA-C  nicotine (NICODERM CQ - DOSED IN MG/24 HOURS) 21 mg/24hr patch Place 1 patch (21 mg total) onto the skin daily. (May buy from over the counter): For smoking cessation 04/11/18   Armandina StammerNwoko, Agnes I, NP  nicotine polacrilex (NICORETTE) 2 MG gum Take 1 each (2 mg total) by mouth as needed for smoking cessation. 04/11/18   Armandina StammerNwoko, Agnes I, NP  omeprazole (PRILOSEC) 20 MG capsule Take 1  capsule (20 mg total) by mouth daily. 12/14/18   Rancour, Jeannett SeniorStephen, MD  traZODone (DESYREL) 50 MG tablet Take 1 tablet (50 mg total) by mouth at bedtime as needed for sleep. 04/11/18   Sanjuana KavaNwoko, Agnes I, NP    Family History Family History  Problem Relation Age of Onset  . Depression Mother   . Diabetes Other   . Hyperlipidemia Other   . Hypertension Other     Social History Social History   Tobacco Use  . Smoking status: Current Every Day Smoker    Packs/day: 0.50    Years: 0.00    Pack years: 0.00    Types: Cigarettes  . Smokeless tobacco: Never Used  Substance Use Topics  . Alcohol use: Not Currently    Comment: minimal  . Drug use: Yes    Types: Marijuana     Allergies   Peanut-containing drug products and Lactose intolerance (gi)   Review of Systems Review of Systems  Constitutional: Negative for fever.  Respiratory: Negative for shortness of breath.   Cardiovascular: Negative for chest pain.  Gastrointestinal: Negative for abdominal pain.  Genitourinary: Negative for difficulty urinating.  Musculoskeletal: Positive for back pain. Negative for arthralgias, gait problem and neck pain.  Skin: Negative for rash and wound.  Allergic/Immunologic: Negative for immunocompromised state.  Neurological: Negative for weakness and numbness.  Psychiatric/Behavioral: Negative for confusion.  All other systems reviewed and are negative.    Physical Exam Updated Vital Signs BP (!) 149/97 (BP Location: Left Arm) Comment: Simultaneous filing. User may not have seen previous data.  Pulse 63   Temp 97.8 F (36.6 C) (Oral)   Resp 15   Ht 5\' 9"  (1.753 m)   Wt 95.3 kg   SpO2 98%   BMI 31.01 kg/m   Physical Exam Vitals signs and nursing note reviewed.  Constitutional:      General: He is not in acute distress.    Appearance: He is well-developed. He is not diaphoretic.  HENT:     Head: Normocephalic and atraumatic.  Pulmonary:     Effort: Pulmonary effort is normal.   Musculoskeletal:        General: Tenderness present. No swelling, deformity or signs of injury.     Cervical back: Normal.     Thoracic back: He exhibits tenderness. He exhibits no bony tenderness.     Lumbar back: He exhibits tenderness. He exhibits no bony tenderness.       Back:     Right lower leg: No edema.     Left lower leg: No edema.  Skin:    General: Skin is warm and dry.     Findings: No erythema or rash.  Neurological:     Mental Status: He is alert and oriented to person, place, and time.     Sensory: Sensation is intact.     Motor:  Motor function is intact.     Gait: Gait is intact. Gait normal.     Deep Tendon Reflexes:     Reflex Scores:      Patellar reflexes are 2+ on the right side and 2+ on the left side.    Comments: Leg strength symmetric.  Psychiatric:        Behavior: Behavior normal.      ED Treatments / Results  Labs (all labs ordered are listed, but only abnormal results are displayed) Labs Reviewed - No data to display  EKG None  Radiology No results found.  Procedures Procedures (including critical care time)  Medications Ordered in ED Medications  ibuprofen (ADVIL) tablet 800 mg (has no administration in time range)     Initial Impression / Assessment and Plan / ED Course  I have reviewed the triage vital signs and the nursing notes.  Pertinent labs & imaging results that were available during my care of the patient were reviewed by me and considered in my medical decision making (see chart for details).  Clinical Course as of Jul 03 446  Sat Jul 04, 2019  0441 23yo male with complaint of pain in his back, onset tonight while walking, no injury. Similar pain in the past, no new or different symptoms, has not taken anything for his pain. On exam, mild tenderness along medial border of scapula and right lumbar para spinous tenderness. No midline tenderness, reflexes and leg strength equal, normal gait. Patient was given Motrin.  Advised to continue motrin and tylenol at home with warm compresses, follow up with PCP if pain persists.    [LM]    Clinical Course User Index [LM] Tacy Learn, PA-C      Final Clinical Impressions(s) / ED Diagnoses   Final diagnoses:  Chronic right-sided thoracic back pain    ED Discharge Orders    None       Tacy Learn, PA-C 07/04/19 [place], MD 07/04/19 [phone]

## 2019-09-07 ENCOUNTER — Encounter (HOSPITAL_COMMUNITY): Payer: Self-pay | Admitting: Emergency Medicine

## 2019-09-07 ENCOUNTER — Ambulatory Visit (HOSPITAL_COMMUNITY)
Admission: EM | Admit: 2019-09-07 | Discharge: 2019-09-07 | Disposition: A | Discharge status: Home / Self Care | Payer: Medicaid Other | Attending: Family Medicine | Admitting: Family Medicine

## 2019-09-07 ENCOUNTER — Other Ambulatory Visit: Payer: Self-pay

## 2019-09-07 DIAGNOSIS — F17210 Nicotine dependence, cigarettes, uncomplicated: Secondary | ICD-10-CM | POA: Diagnosis not present

## 2019-09-07 DIAGNOSIS — R030 Elevated blood-pressure reading, without diagnosis of hypertension: Secondary | ICD-10-CM | POA: Insufficient documentation

## 2019-09-07 DIAGNOSIS — Z20822 Contact with and (suspected) exposure to covid-19: Secondary | ICD-10-CM | POA: Insufficient documentation

## 2019-09-07 DIAGNOSIS — R481 Agnosia: Secondary | ICD-10-CM

## 2019-09-07 DIAGNOSIS — R439 Unspecified disturbances of smell and taste: Secondary | ICD-10-CM | POA: Insufficient documentation

## 2019-09-07 NOTE — Discharge Instructions (Signed)
You have been tested for COVID-19 today. If your test returns positive, you will receive a phone call from [facility] regarding your results. Negative test results are not called. Both positive and negative results area always visible on MyChart. If you do not have a MyChart account, sign up instructions are provided in your discharge papers. Please do not hesitate to contact us should you have questions or concerns.  Your blood pressure was noted to be elevated during your visit today. You may return here within the next few days to recheck if unable to see your primary care doctor. If your blood pressure remains persistently elevated, you may need to begin taking a medication.  BP (!) 157/108 (BP Location: Left Arm)   Pulse 77   Temp 98.5 F (36.9 C) (Oral)   Resp (!) 24   SpO2 99%

## 2019-09-07 NOTE — ED Triage Notes (Signed)
Pt reports loss of taste over the last three days.  Pt denies any other symptoms, including all Covid related symptoms.

## 2019-09-09 LAB — NOVEL CORONAVIRUS, NAA (HOSP ORDER, SEND-OUT TO REF LAB; TAT 18-24 HRS): SARS-CoV-2, NAA: NOT DETECTED

## 2019-09-09 NOTE — ED Provider Notes (Addendum)
[facility] CARE CENTER   [id number] 09/07/19 Arrival Time: 1223  ASSESSMENT & PLAN:  1. Loss of perception for taste   2. Elevated blood pressure reading without diagnosis of hypertension      COVID-19 testing sent at visit. Results reviewed: negative. See letter/work note on file for self-isolation guidelines.  Recommend: Follow-up Information    Schedule an appointment as soon as possible for a visit  with Primary Care at [facility].   Specialty: Family Medicine Why: To establish care with a primary care provider. Contact information: [address], [address] [place] [address] [phone]           Discharge Instructions     You have been tested for COVID-19 today. If your test returns positive, you will receive a phone call from [facility] regarding your results. Negative test results are not called. Both positive and negative results area always visible on MyChart. If you do not have a MyChart account, sign up instructions are provided in your discharge papers. Please do not hesitate to contact us should you have questions or concerns.  Your blood pressure was noted to be elevated during your visit today. You may return here within the next few days to recheck if unable to see your primary care doctor. If your blood pressure remains persistently elevated, you may need to begin taking a medication.  BP (!) 157/108 (BP Location: Left Arm)   Pulse 77   Temp 98.5 F (36.9 C) (Oral)   Resp (!) 24   SpO2 99%       Reviewed expectations re: course of current medical issues. Questions answered. Outlined signs and symptoms indicating need for more acute intervention. Patient verbalized understanding. After Visit Summary given.   SUBJECTIVE: History from: patient. Ryan Bartlett is a 24 y.o. male who requests COVID-19 testing. Known COVID-19 contact: none. Recent travel: none. Denies: runny nose, congestion, fever, cough, sore throat,  difficulty breathing and headache. Does report loss of smell/taste over the past 2-3 days. Normal PO intake without n/v/d.  Increased blood pressure noted today. Reports that he has not been treated for hypertension in the past.  He reports no chest pain on exertion, no dyspnea on exertion, no swelling of ankles, no orthostatic dizziness or lightheadedness, no orthopnea or paroxysmal nocturnal dyspnea, no palpitations and no intermittent claudication symptoms.  ROS: As per HPI.   OBJECTIVE:  Vitals:   09/07/19 1320  BP: (!) 157/108  Pulse: 77  Resp: (!) 24  Temp: 98.5 F (36.9 C)  TempSrc: Oral  SpO2: 99%    Recheck RR: 18  General appearance: alert; no distress Eyes: PERRLA; EOMI; conjunctiva normal HENT: [place]; AT; nasal mucosa normal; oral mucosa normal Neck: supple  Lungs: speaks full sentences without difficulty; unlabored Extremities: no edema Skin: warm and dry Neurologic: normal gait Psychological: alert and cooperative; normal mood and affect  Labs: Results for orders placed or performed during the hospital encounter of 09/07/19  Novel Coronavirus, NAA (Hosp order, Send-out to Ref Lab; TAT 18-24 hrs   Specimen: Nasopharyngeal Swab; Respiratory  Result Value Ref Range   SARS-CoV-2, NAA NOT DETECTED NOT DETECTED   Coronavirus Source NASOPHARYNGEAL    Labs Reviewed  NOVEL CORONAVIRUS, NAA (HOSP ORDER, SEND-OUT TO REF LAB; TAT 18-24 HRS)     Allergies  Allergen Reactions  . Peanut-Containing Drug Products Anaphylaxis  . Lactose Intolerance (Gi) Diarrhea and Nausea And Vomiting    Past Medical History:  Diagnosis Date  . ADHD (attention  deficit hyperactivity disorder)   . Arthritis   . Asthma   . Bipolar disorder ([place])   . Depression   . Oppositional defiant disorder   . PTSD (post-traumatic stress disorder)   . Schizoaffective disorder ([place])   . Unspecified episodic mood disorder    Social History   Socioeconomic History  . Marital status: Single      Spouse name: Not on file  . Number of children: Not on file  . Years of education: Not on file  . Highest education level: Not on file  Occupational History  . Occupation: Lexicographer: [employer]    Comment: 12th grade at [employer] Use  . Smoking status: Current Every Day Smoker    Packs/day: 0.50    Years: 0.00    Pack years: 0.00    Types: Cigarettes  . Smokeless tobacco: Never Used  Substance and Sexual Activity  . Alcohol use: Not Currently    Comment: minimal  . Drug use: Yes    Types: Marijuana  . Sexual activity: Yes    Partners: Female    Comment: Pt reports that he is not sexually active  Other Topics Concern  . Not on file  Social History Narrative  . Not on file   Social Determinants of Health   Financial Resource Strain:   . Difficulty of Paying Living Expenses: Not on file  Food Insecurity:   . Worried About Charity fundraiser in the Last Year: Not on file  . Ran Out of Food in the Last Year: Not on file  Transportation Needs:   . Lack of Transportation (Medical): Not on file  . Lack of Transportation (Non-Medical): Not on file  Physical Activity:   . Days of Exercise per Week: Not on file  . Minutes of Exercise per Session: Not on file  Stress:   . Feeling of Stress : Not on file  Social Connections:   . Frequency of Communication with Friends and Family: Not on file  . Frequency of Social Gatherings with Friends and Family: Not on file  . Attends Religious Services: Not on file  . Active Member of Clubs or Organizations: Not on file  . Attends Archivist Meetings: Not on file  . Marital Status: Not on file  Intimate Partner Violence:   . Fear of Current or Ex-Partner: Not on file  . Emotionally Abused: Not on file  . Physically Abused: Not on file  . Sexually Abused: Not on file   Family History  Problem Relation Age of Onset  . Depression Mother   . Diabetes Other   . Hyperlipidemia Other   . Hypertension Other     Past Surgical History:  Procedure Laterality Date  . NO PAST SURGERIES       Vanessa Kick, MD 09/09/19 1610    Vanessa Kick, MD 09/09/19 [phone]

## 2019-10-03 ENCOUNTER — Emergency Department (HOSPITAL_COMMUNITY)
Admission: EM | Admit: 2019-10-03 | Discharge: 2019-10-04 | Disposition: A | Discharge status: Home / Self Care | Payer: Medicaid Other | Attending: Emergency Medicine | Admitting: Emergency Medicine

## 2019-10-03 ENCOUNTER — Other Ambulatory Visit: Payer: Self-pay

## 2019-10-03 ENCOUNTER — Encounter (HOSPITAL_COMMUNITY): Payer: Self-pay | Admitting: Emergency Medicine

## 2019-10-03 DIAGNOSIS — Z20822 Contact with and (suspected) exposure to covid-19: Secondary | ICD-10-CM | POA: Insufficient documentation

## 2019-10-03 DIAGNOSIS — F1220 Cannabis dependence, uncomplicated: Secondary | ICD-10-CM | POA: Diagnosis not present

## 2019-10-03 DIAGNOSIS — Z79899 Other long term (current) drug therapy: Secondary | ICD-10-CM | POA: Insufficient documentation

## 2019-10-03 DIAGNOSIS — F17210 Nicotine dependence, cigarettes, uncomplicated: Secondary | ICD-10-CM | POA: Insufficient documentation

## 2019-10-03 DIAGNOSIS — F315 Bipolar disorder, current episode depressed, severe, with psychotic features: Secondary | ICD-10-CM | POA: Insufficient documentation

## 2019-10-03 DIAGNOSIS — F1210 Cannabis abuse, uncomplicated: Secondary | ICD-10-CM | POA: Insufficient documentation

## 2019-10-03 DIAGNOSIS — R45851 Suicidal ideations: Secondary | ICD-10-CM | POA: Diagnosis not present

## 2019-10-03 DIAGNOSIS — F319 Bipolar disorder, unspecified: Secondary | ICD-10-CM | POA: Diagnosis present

## 2019-10-03 DIAGNOSIS — F1510 Other stimulant abuse, uncomplicated: Secondary | ICD-10-CM | POA: Diagnosis not present

## 2019-10-03 DIAGNOSIS — F250 Schizoaffective disorder, bipolar type: Secondary | ICD-10-CM | POA: Diagnosis present

## 2019-10-03 DIAGNOSIS — F32A Depression, unspecified: Secondary | ICD-10-CM

## 2019-10-03 DIAGNOSIS — F1994 Other psychoactive substance use, unspecified with psychoactive substance-induced mood disorder: Secondary | ICD-10-CM | POA: Diagnosis not present

## 2019-10-03 DIAGNOSIS — F329 Major depressive disorder, single episode, unspecified: Secondary | ICD-10-CM

## 2019-10-03 DIAGNOSIS — F1020 Alcohol dependence, uncomplicated: Secondary | ICD-10-CM | POA: Diagnosis present

## 2019-10-03 LAB — CBC
HCT: 45.6 % (ref 39.0–52.0)
Hemoglobin: 15.1 g/dL (ref 13.0–17.0)
MCH: 29.1 pg (ref 26.0–34.0)
MCHC: 33.1 g/dL (ref 30.0–36.0)
MCV: 87.9 fL (ref 80.0–100.0)
Platelets: 198 10*3/uL (ref 150–400)
RBC: 5.19 MIL/uL (ref 4.22–5.81)
RDW: 13.6 % (ref 11.5–15.5)
WBC: 9.7 10*3/uL (ref 4.0–10.5)
nRBC: 0 % (ref 0.0–0.2)

## 2019-10-03 LAB — BASIC METABOLIC PANEL
Anion gap: 8 (ref 5–15)
BUN: 17 mg/dL (ref 6–20)
CO2: 26 mmol/L (ref 22–32)
Calcium: 9 mg/dL (ref 8.9–10.3)
Chloride: 104 mmol/L (ref 98–111)
Creatinine, Ser: 0.89 mg/dL (ref 0.61–1.24)
GFR calc Af Amer: >60 mL/min (ref >60)
GFR calc non Af Amer: >60 mL/min (ref >60)
Glucose, Bld: 98 mg/dL (ref 70–99)
Potassium: 3.3 mmol/L — ABNORMAL LOW (ref 3.5–5.1)
Sodium: 138 mmol/L (ref 135–145)

## 2019-10-03 LAB — SALICYLATE LEVEL: Salicylate Lvl: <7 mg/dL — ABNORMAL LOW (ref 7.0–30.0)

## 2019-10-03 LAB — ACETAMINOPHEN LEVEL: Acetaminophen (Tylenol), Serum: <10 ug/mL — ABNORMAL LOW (ref 10–30)

## 2019-10-03 LAB — ETHANOL: Alcohol, Ethyl (B): <10 mg/dL (ref <10)

## 2019-10-03 LAB — VALPROIC ACID LEVEL: Valproic Acid Lvl: <10 ug/mL — ABNORMAL LOW (ref 50.0–100.0)

## 2019-10-03 MED ORDER — ARIPIPRAZOLE 10 MG PO TABS
10.0000 mg | ORAL_TABLET | Freq: Every day | ORAL | Status: DC
  Administered 2019-10-03 – 2019-10-04 (×2): 10 mg via ORAL
  Filled 2019-10-03 (×2): qty 1

## 2019-10-03 MED ORDER — NICOTINE 21 MG/24HR TD PT24
21.0000 mg | MEDICATED_PATCH | Freq: Every day | TRANSDERMAL | Status: DC
  Administered 2019-10-03 – 2019-10-04 (×2): 21 mg via TRANSDERMAL
  Filled 2019-10-03 (×2): qty 1

## 2019-10-03 MED ORDER — DIVALPROEX SODIUM 500 MG PO DR TAB
500.0000 mg | DELAYED_RELEASE_TABLET | Freq: Two times a day (BID) | ORAL | Status: DC
  Administered 2019-10-03 – 2019-10-04 (×2): 500 mg via ORAL
  Filled 2019-10-03 (×2): qty 1

## 2019-10-03 MED ORDER — HYDROXYZINE HCL 25 MG PO TABS
50.0000 mg | ORAL_TABLET | Freq: Four times a day (QID) | ORAL | Status: DC | PRN
  Administered 2019-10-04: 09:00:00 50 mg via ORAL
  Filled 2019-10-03: qty 2

## 2019-10-03 MED ORDER — BUSPIRONE HCL 10 MG PO TABS
15.0000 mg | ORAL_TABLET | Freq: Three times a day (TID) | ORAL | Status: DC
  Administered 2019-10-03 – 2019-10-04 (×2): 15 mg via ORAL
  Filled 2019-10-03 (×2): qty 2

## 2019-10-03 NOTE — ED Notes (Signed)
Date and time results received: 10/03/19 2140 (use smartphrase ".now" to insert current time)  Test: TTS report Critical Value: Pt meet criteria for admission but no beds available at [facility]  Name of Provider Notified: B.Shon B[place] [employer]

## 2019-10-03 NOTE — ED Notes (Addendum)
Ryan Bartlett currently having TTS being completed will obtain after TTS is complete labs and COVID 19 swab obtained and sent
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
## 2019-10-04 ENCOUNTER — Encounter (HOSPITAL_COMMUNITY): Payer: Self-pay | Admitting: Registered Nurse

## 2019-10-04 DIAGNOSIS — F1510 Other stimulant abuse, uncomplicated: Secondary | ICD-10-CM

## 2019-10-04 DIAGNOSIS — F1994 Other psychoactive substance use, unspecified with psychoactive substance-induced mood disorder: Secondary | ICD-10-CM

## 2019-10-04 DIAGNOSIS — F1220 Cannabis dependence, uncomplicated: Secondary | ICD-10-CM

## 2019-10-04 LAB — RAPID URINE DRUG SCREEN, HOSP PERFORMED
Amphetamines: POSITIVE — AB
Barbiturates: NOT DETECTED
Benzodiazepines: NOT DETECTED
Cocaine: NOT DETECTED
Opiates: NOT DETECTED
Tetrahydrocannabinol: POSITIVE — AB

## 2019-10-04 LAB — URINALYSIS, ROUTINE W REFLEX MICROSCOPIC
Bacteria, UA: NONE SEEN
Bilirubin Urine: NEGATIVE
Glucose, UA: NEGATIVE mg/dL
Hgb urine dipstick: NEGATIVE
Ketones, ur: NEGATIVE mg/dL
Nitrite: NEGATIVE
Protein, ur: NEGATIVE mg/dL
Specific Gravity, Urine: 1.016 (ref 1.005–1.030)
pH: 7 (ref 5.0–8.0)

## 2019-10-04 LAB — SARS CORONAVIRUS 2 (TAT 6-24 HRS): SARS Coronavirus 2: NEGATIVE

## 2019-10-04 NOTE — Discharge Instructions (Signed)
Suicidal Feelings: How to Help Yourself Suicide is when you end your own life. There are many things you can do to help yourself feel better when struggling with these feelings. Many services and people are available to support you and others who struggle with similar feelings.  If you ever feel like you may hurt yourself or others, or have thoughts about taking your own life, get help right away. To get help:  Call your local emergency services (911 in the [place]  The [employer]'s health and human services helpline (211 in the [place]  Go to your nearest emergency department.  Call a suicide hotline to speak with a trained counselor. The following suicide hotlines are available in the [place]: ? 1-800-273-TALK (1-800-273-8255). ? 1-800-SUICIDE (1-800-784-2433). ? 1-888-628-9454. This is a hotline for Spanish speakers. ? 1-800-799-4889. This is a hotline for TTY users. ? 1-866-4-U-TREVOR (1-866-488-7386). This is a hotline for lesbian, gay, bisexual, transgender, or questioning youth. ? For a list of hotlines in [place], visit www.suicide.org/hotlines/international/[place]-suicide-hotlines.html  Contact a crisis center or a local suicide prevention center. To find a crisis center or suicide prevention center: ? Call your local hospital, clinic, community service organization, mental health center, social service provider, or health department. Ask for help with connecting to a crisis center. ? For a list of crisis centers in the [place], visit: suicidepreventionlifeline.org ? For a list of crisis centers in [place], visit: suicideprevention.ca How to help yourself feel better   Promise yourself that you will not do anything extreme when you have suicidal feelings. Remember, there is hope. Many people have gotten through suicidal thoughts and feelings, and you can too. If you have had these feelings before, remind yourself that you can get through them again.  Let family, friends,  teachers, or counselors know how you are feeling. Try not to separate yourself from those who care about you and want to help you. Talk with someone every day, even if you do not feel sociable. Face-to-face conversation is best to help them understand your feelings.  Contact a mental health care provider and work with this person regularly.  Make a safety plan that you can follow during a crisis. Include phone numbers of suicide prevention hotlines, mental health professionals, and trusted friends and family members you can call during an emergency. Save these numbers on your phone.  If you are thinking of taking a lot of medicine, give your medicine to someone who can give it to you as prescribed. If you are on antidepressants and are concerned you will overdose, tell your health care provider so that he or she can give you safer medicines.  Try to stick to your routines. Follow a schedule every day. Make self-care a priority.  Make a list of realistic goals, and cross them off when you achieve them. Accomplishments can give you a sense of worth.  Wait until you are feeling better before doing things that you find difficult or unpleasant.  Do things that you have always enjoyed to take your mind off your feelings. Try reading a book, or listening to or playing music. Spending time outside, in nature, may help you feel better. Follow these instructions at home:   Visit your primary health care provider every year for a checkup.  Work with a mental health care provider as needed.  Eat a well-balanced diet, and eat regular meals.  Get plenty of rest.  Exercise if you are able. Just 30 minutes of exercise each day can   help you feel better.  Take over-the-counter and prescription medicines only as told by your health care provider. Ask your mental health care provider about the possible side effects of any medicines you are taking.  Do not use alcohol or drugs, and remove these substances  from your home.  Remove weapons, poisons, knives, and other deadly items from your home. General recommendations  Keep your living space well lit.  When you are feeling well, write yourself a letter with tips and support that you can read when you are not feeling well.  Remember that life's difficulties can be sorted out with help. Conditions can be treated, and you can learn behaviors and ways of thinking that will help you. Where to find more information  National Suicide Prevention Lifeline: www.suicidepreventionlifeline.org  Hopeline: www.hopeline.com  American Foundation for Suicide Prevention: www.afsp.org  The Trevor Project (for lesbian, gay, bisexual, transgender, or questioning youth): www.thetrevorproject.org Contact a health care provider if:  You feel as though you are a burden to others.  You feel agitated, angry, vengeful, or have extreme mood swings.  You have withdrawn from family and friends. Get help right away if:  You are talking about suicide or wishing to die.  You start making plans for how to commit suicide.  You feel that you have no reason to live.  You start making plans for putting your affairs in order, saying goodbye, or giving your possessions away.  You feel guilt, shame, or unbearable pain, and it seems like there is no way out.  You are frequently using drugs or alcohol.  You are engaging in risky behaviors that could lead to death. If you have any of these symptoms, get help right away. Call emergency services, go to your nearest emergency department or crisis center, or call a suicide crisis helpline. Summary  Suicide is when you take your own life.  Promise yourself that you will not do anything extreme when you have suicidal feelings.  Let family, friends, teachers, or counselors know how you are feeling.  Get help right away if you feel as though life is getting too tough to handle and you are thinking about suicide. This  information is not intended to replace advice given to you by your health care provider. Make sure you discuss any questions you have with your health care provider. Document Revised: 11/27/2018 Document Reviewed: 03/19/2017 Elsevier Patient Education  2020 Elsevier Inc.  

## 2019-10-04 NOTE — Progress Notes (Signed)
Patient meets criteria for inpatient treatment per Nira Conn, NP. No appropriate beds at [facility] currently. CSW faxed referrals to the following facilities for review:  [facility]   [facility]   [facility]   [facility]   [facility]   [facility]   [facility]   [facility]   [facility]   [facility]   [facility]   [facility]    TTS will continue to seek bed placement.     Ruthann Cancer MSW, [facility] Social Worker Disposition  [facility] Ph: [phone] Fax: [phone] 10/04/2019 9:26 AM

## 2019-10-04 NOTE — Consult Note (Addendum)
[facility] ED Discharge  10/04/2019 12:32 PM Ryan Bartlett  MRN:  [id number] Principal Problem: Substance induced mood disorder [facility]) Discharge Diagnoses: Principal Problem:   Substance induced mood disorder (HCC) Active Problems:   Cannabis use disorder, moderate, dependence (HCC)   Alcohol use disorder, moderate, dependence (HCC)   Methamphetamine abuse (HCC)   Schizoaffective disorder, bipolar type (HCC)   Subjective: Ryan Bartlett, 24 y.o., male patient seen via tele psych by this provider, Dr. Jannifer Franklin; and chart reviewed on 10/04/19.  On evaluation Ryan Bartlett reports he is feeling better this morning and is no longer feeling suicidal.  "I was just having a bad time yesterday but I'm feeling much better today since I had a good nights sleep."  At this time patient denies suicidal/self-harm/homicidal ideation, psychosis, and paranoia.  Patient states that he lives with his brother who is supportive.  Patient also states that he currently has outpatient psychiatric services with [facility] and has an ACTT team that visits once a week.   During evaluation Ryan Bartlett is alert/oriented x 4; calm/cooperative; and mood is congruent with affect.  He does not appear to be responding to internal/external stimuli or delusional thoughts.  Patient denies suicidal/self-harm/homicidal ideation, psychosis, and paranoia.  Patient answered question appropriately.     Total Time spent with patient: 30 minutes  Past Psychiatric History: See above  Past Medical History:  Past Medical History:  Diagnosis Date  . ADHD (attention deficit hyperactivity disorder)   . Arthritis   . Asthma   . Bipolar disorder (HCC)   . Depression   . Oppositional defiant disorder   . PTSD (post-traumatic stress disorder)   . Schizoaffective disorder (HCC)   . Unspecified episodic mood disorder     Past Surgical History:  Procedure Laterality Date  . NO PAST SURGERIES     Family History:   Family History  Problem Relation Age of Onset  . Depression Mother   . Diabetes Other   . Hyperlipidemia Other   . Hypertension Other    Family Psychiatric  History: See above Social History:  Social History   Substance and Sexual Activity  Alcohol Use Not Currently   Comment: minimal     Social History   Substance and Sexual Activity  Drug Use Yes  . Types: Marijuana    Social History   Socioeconomic History  . Marital status: Single    Spouse name: Not on file  . Number of children: Not on file  . Years of education: Not on file  . Highest education level: Not on file  Occupational History  . Occupation: Dentist: [employer]    Comment: 12th grade at [employer]  Tobacco Use  . Smoking status: Current Every Day Smoker    Packs/day: 0.50    Years: 0.00    Pack years: 0.00    Types: Cigarettes  . Smokeless tobacco: Never Used  Substance and Sexual Activity  . Alcohol use: Not Currently    Comment: minimal  . Drug use: Yes    Types: Marijuana  . Sexual activity: Yes    Partners: Female    Comment: Pt reports that he is not sexually active  Other Topics Concern  . Not on file  Social History Narrative  . Not on file   Social Determinants of Health   Financial Resource Strain:   . Difficulty of Paying Living Expenses: Not on file  Food Insecurity:   . Worried About  Running Out of Food in the Last Year: Not on file  . Ran Out of Food in the Last Year: Not on file  Transportation Needs:   . Lack of Transportation (Medical): Not on file  . Lack of Transportation (Non-Medical): Not on file  Physical Activity:   . Days of Exercise per Week: Not on file  . Minutes of Exercise per Session: Not on file  Stress:   . Feeling of Stress : Not on file  Social Connections:   . Frequency of Communication with Friends and Family: Not on file  . Frequency of Social Gatherings with Friends and Family: Not on file  . Attends Religious Services: Not on file  .  Active Member of Clubs or Organizations: Not on file  . Attends Archivist Meetings: Not on file  . Marital Status: Not on file    Has this patient used any form of tobacco in the last 30 days? (Cigarettes, Smokeless Tobacco, Cigars, and/or Pipes) A prescription for an FDA-approved tobacco cessation medication was offered at discharge and the patient refused  Current Medications: Current Facility-Administered Medications  Medication Dose Route Frequency Provider Last Rate Last Admin  . ARIPiprazole (ABILIFY) tablet 10 mg  10 mg Oral Daily Caccavale, Sophia, PA-C   10 mg at 10/04/19 0847  . busPIRone (BUSPAR) tablet 15 mg  15 mg Oral TID Caccavale, Sophia, PA-C   15 mg at 10/04/19 0846  . divalproex (DEPAKOTE) DR tablet 500 mg  500 mg Oral Q12H Caccavale, Sophia, PA-C   500 mg at 10/04/19 0848  . hydrOXYzine (ATARAX/VISTARIL) tablet 50 mg  50 mg Oral Q6H PRN Caccavale, Sophia, PA-C   50 mg at 10/04/19 0847  . nicotine (NICODERM CQ - dosed in mg/24 hours) patch 21 mg  21 mg Transdermal Daily Caccavale, Sophia, PA-C   21 mg at 10/04/19 6440   Current Outpatient Medications  Medication Sig Dispense Refill  . albuterol (PROVENTIL HFA;VENTOLIN HFA) 108 (90 Base) MCG/ACT inhaler Inhale 1-2 puffs into the lungs every 4 (four) hours as needed for wheezing or shortness of breath.    . ARIPiprazole (ABILIFY) 10 MG tablet Take 1 tablet (10 mg total) by mouth daily. For mood control (Patient not taking: Reported on 10/03/2019) 30 tablet 0  . busPIRone (BUSPAR) 15 MG tablet Take 1 tablet (15 mg total) by mouth 3 (three) times daily. For anxiety 90 tablet 0  . cetirizine (ZYRTEC) 10 MG tablet Take 1 tablet (10 mg total) by mouth daily. 10 tablet 0  . divalproex (DEPAKOTE) 500 MG DR tablet Take 1 tablet (500 mg total) by mouth every 12 (twelve) hours. For mood stabilization (Patient not taking: Reported on 10/03/2019) 60 tablet 0  . hydrOXYzine (ATARAX/VISTARIL) 50 MG tablet Take 1 tablet (50 mg  total) by mouth every 6 (six) hours as needed for anxiety. (Patient not taking: Reported on 10/03/2019) 60 tablet 0  . nicotine (NICODERM CQ - DOSED IN MG/24 HOURS) 21 mg/24hr patch Place 1 patch (21 mg total) onto the skin daily. (May buy from over the counter): For smoking cessation (Patient not taking: Reported on 10/03/2019) 28 patch 0  . omeprazole (PRILOSEC) 20 MG capsule Take 1 capsule (20 mg total) by mouth daily. (Patient not taking: Reported on 10/03/2019) 30 capsule 0  . traZODone (DESYREL) 50 MG tablet Take 1 tablet (50 mg total) by mouth at bedtime as needed for sleep. (Patient not taking: Reported on 10/03/2019) 30 tablet 0   PTA Medications: (Not in a  hospital admission)   Musculoskeletal: Strength & Muscle Tone: within normal limits Gait & Station: normal Patient leans: N/A  Psychiatric Specialty Exam: Physical Exam Vitals and nursing note reviewed.  Constitutional:      Appearance: Normal appearance.  Pulmonary:     Effort: Pulmonary effort is normal.  Musculoskeletal:        General: Normal range of motion.     Cervical back: Normal range of motion.  Neurological:     Mental Status: He is alert.  Psychiatric:        Attention and Perception: Attention and perception normal.        Mood and Affect: Mood and affect normal.        Speech: Speech normal.        Behavior: Behavior normal.        Thought Content: Thought content normal.        Cognition and Memory: Cognition and memory normal.        Judgment: Judgment normal.     Review of Systems  Psychiatric/Behavioral: Negative for agitation. Decreased concentration: Denies. Hallucinations: Denies. Self-injury: Denies. Sleep disturbance: Denies. Suicidal ideas: Denies. Nervous/anxious: Stable.   All other systems reviewed and are negative.   Blood pressure (!) 131/98, pulse 67, temperature 97.7 F (36.5 C), temperature source Oral, resp. rate 16, height 5\' 10"  (1.778 m), SpO2 100 %.Body mass index is 30.13 kg/m.   General Appearance: Casual  Eye Contact:  Good  Speech:  Blocked and Normal Rate  Volume:  Normal  Mood:  "Lot better" Appropriate  Affect:  Appropriate and Congruent  Thought Process:  Coherent, Goal Directed and Descriptions of Associations: Intact  Orientation:  Full (Time, Place, and Person)  Thought Content:  WDL  Suicidal Thoughts:  No  Homicidal Thoughts:  No  Memory:  Immediate;   Good Recent;   Good  Judgement:  Intact  Insight:  Present  Psychomotor Activity:  Normal  Concentration:  Concentration: Good and Attention Span: Good  Recall:  Good  Fund of Knowledge:  Fair  Language:  Good  Akathisia:  No  Handed:  Right  AIMS (if indicated):     Assets:  Communication Skills Desire for Improvement Housing Social Support  ADL's:  Intact  Cognition:  WNL  Sleep:        Demographic Factors:  Male  Loss Factors: NA  Historical Factors: Impulsivity  Risk Reduction Factors:   Sense of responsibility to family, Religious beliefs about death, Living with another person, especially a relative and Positive social support  Continued Clinical Symptoms:  Alcohol/Substance Abuse/Dependencies Previous Psychiatric Diagnoses and Treatments  Cognitive Features That Contribute To Risk:  None    Suicide Risk:  Minimal: No identifiable suicidal ideation.  Patients presenting with no risk factors but with morbid ruminations; may be classified as minimal risk based on the severity of the depressive symptoms  Follow-up Information    [facility] Follow up.   Contact information: [address] [place] [phone]           Plan Of Care/Follow-up recommendations:  Activity:  As tolerated Diet:  Heart healthy  Disposition:  Patient psychiatrically cleared No evidence of imminent risk to self or others at present.   Patient does not meet criteria for psychiatric inpatient admission. Supportive therapy provided about ongoing  stressors. Discussed crisis plan, support from social network, calling 911, coming to the Emergency Department, and calling Suicide Hotline.  Shuvon Rankin, NP 10/04/2019, 12:32 PM  Patient seen face-to-face for psychiatric evaluation, chart reviewed and case discussed with the physician extender and developed treatment plan. Reviewed the information documented and agree with the treatment plan. Corena Pilgrim, MD

## 2020-01-09 ENCOUNTER — Other Ambulatory Visit: Payer: Self-pay

## 2020-01-09 ENCOUNTER — Encounter (HOSPITAL_COMMUNITY): Payer: Self-pay | Admitting: Emergency Medicine

## 2020-01-09 ENCOUNTER — Emergency Department (HOSPITAL_COMMUNITY)
Admission: EM | Admit: 2020-01-09 | Discharge: 2020-01-09 | Disposition: A | Discharge status: Home / Self Care | Payer: Medicaid Other | Attending: Emergency Medicine | Admitting: Emergency Medicine

## 2020-01-09 DIAGNOSIS — J45909 Unspecified asthma, uncomplicated: Secondary | ICD-10-CM | POA: Insufficient documentation

## 2020-01-09 DIAGNOSIS — W3400XA Accidental discharge from unspecified firearms or gun, initial encounter: Secondary | ICD-10-CM | POA: Diagnosis not present

## 2020-01-09 DIAGNOSIS — S40811A Abrasion of right upper arm, initial encounter: Secondary | ICD-10-CM | POA: Diagnosis not present

## 2020-01-09 DIAGNOSIS — S4991XA Unspecified injury of right shoulder and upper arm, initial encounter: Secondary | ICD-10-CM | POA: Diagnosis present

## 2020-01-09 DIAGNOSIS — Y999 Unspecified external cause status: Secondary | ICD-10-CM | POA: Insufficient documentation

## 2020-01-09 DIAGNOSIS — Z91010 Allergy to peanuts: Secondary | ICD-10-CM | POA: Insufficient documentation

## 2020-01-09 DIAGNOSIS — F17210 Nicotine dependence, cigarettes, uncomplicated: Secondary | ICD-10-CM | POA: Diagnosis not present

## 2020-01-09 DIAGNOSIS — Z79899 Other long term (current) drug therapy: Secondary | ICD-10-CM | POA: Insufficient documentation

## 2020-01-09 DIAGNOSIS — Y9259 Other trade areas as the place of occurrence of the external cause: Secondary | ICD-10-CM | POA: Diagnosis not present

## 2020-01-09 DIAGNOSIS — T148XXA Other injury of unspecified body region, initial encounter: Secondary | ICD-10-CM

## 2020-01-09 DIAGNOSIS — Y9389 Activity, other specified: Secondary | ICD-10-CM | POA: Insufficient documentation

## 2020-01-09 MED ORDER — BACITRACIN ZINC 500 UNIT/GM EX OINT
1.0000 "application " | TOPICAL_OINTMENT | Freq: Two times a day (BID) | CUTANEOUS | Status: DC
  Administered 2020-01-09: 1 via TOPICAL
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
## 2020-03-11 ENCOUNTER — Encounter (HOSPITAL_COMMUNITY): Payer: Self-pay

## 2020-03-11 ENCOUNTER — Other Ambulatory Visit: Payer: Self-pay

## 2020-03-11 ENCOUNTER — Ambulatory Visit (HOSPITAL_COMMUNITY)
Admission: EM | Admit: 2020-03-11 | Discharge: 2020-03-11 | Disposition: A | Discharge status: Home / Self Care | Payer: Medicaid Other | Attending: Family Medicine | Admitting: Family Medicine

## 2020-03-11 DIAGNOSIS — R05 Cough: Secondary | ICD-10-CM | POA: Diagnosis not present

## 2020-03-11 DIAGNOSIS — M25551 Pain in right hip: Secondary | ICD-10-CM

## 2020-03-11 DIAGNOSIS — R059 Cough, unspecified: Secondary | ICD-10-CM

## 2020-03-11 MED ORDER — MELOXICAM 15 MG PO TABS: 15.0000 mg | ORAL_TABLET | Freq: Every day | ORAL | 0 refills | Status: DC

## 2020-03-11 MED ORDER — CETIRIZINE HCL 10 MG PO TABS: 10.0000 mg | ORAL_TABLET | Freq: Every day | ORAL | 0 refills | Status: DC
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
## 2020-03-11 NOTE — ED Triage Notes (Signed)
C/o right hip pain x2 days. Also reports he has had a productive cough x2 weeks.

## 2020-03-11 NOTE — Discharge Instructions (Addendum)
Drink plenty of water Take Zyrtec once a day in the evening.  This should help with the postnasal drip, and cough Take meloxicam once a day in the evening.  This should help with the morning hip pain Take these medicines every day until you feel better, then you may use them as needed Try to reduce or cut down or stop smoking

## 2020-06-13 ENCOUNTER — Encounter (HOSPITAL_COMMUNITY): Payer: Self-pay | Admitting: *Deleted

## 2020-06-13 ENCOUNTER — Other Ambulatory Visit: Payer: Self-pay

## 2020-06-13 ENCOUNTER — Ambulatory Visit (HOSPITAL_COMMUNITY)
Admission: EM | Admit: 2020-06-13 | Discharge: 2020-06-13 | Disposition: A | Discharge status: Home / Self Care | Payer: Medicaid Other | Attending: Emergency Medicine | Admitting: Emergency Medicine

## 2020-06-13 DIAGNOSIS — J069 Acute upper respiratory infection, unspecified: Secondary | ICD-10-CM | POA: Diagnosis present

## 2020-06-13 DIAGNOSIS — Z20822 Contact with and (suspected) exposure to covid-19: Secondary | ICD-10-CM | POA: Diagnosis present

## 2020-06-13 LAB — SARS CORONAVIRUS 2 (TAT 6-24 HRS): SARS Coronavirus 2: NEGATIVE

## 2020-06-13 MED ORDER — IBUPROFEN 800 MG PO TABS: 800.0000 mg | ORAL_TABLET | Freq: Three times a day (TID) | ORAL | 0 refills | Status: DC

## 2020-06-13 MED ORDER — FLUTICASONE PROPIONATE 50 MCG/ACT NA SUSP: 1.0000 | Freq: Every day | NASAL | 0 refills | Status: DC

## 2020-06-13 MED ORDER — BENZONATATE 200 MG PO CAPS: 200.0000 mg | ORAL_CAPSULE | Freq: Three times a day (TID) | ORAL | 0 refills | Status: AC | PRN

## 2020-06-13 MED ORDER — DM-GUAIFENESIN ER 30-600 MG PO TB12: 1.0000 | ORAL_TABLET | Freq: Two times a day (BID) | ORAL | 0 refills | Status: DC

## 2020-06-13 NOTE — ED Provider Notes (Signed)
[facility]    CSN: [id number] Arrival date & time: 06/13/20  1010      History   Chief Complaint Chief Complaint  Patient presents with  . Cough  . Generalized Body Aches  . Headache  . Diarrhea    HPI Ryan Bartlett is a 24 y.o. male history of asthma, presenting today for evaluation of URI symptoms.  Patient reports cough congestion body aches headaches fevers and diarrhea for the past 2 days.  He has not used any over-the-counter medicines for symptoms.  Denies close sick contacts.  HPI  Past Medical History:  Diagnosis Date  . ADHD (attention deficit hyperactivity disorder)   . Arthritis   . Asthma   . Bipolar disorder (HCC)   . Depression   . Oppositional defiant disorder   . PTSD (post-traumatic stress disorder)   . Schizoaffective disorder (HCC)   . Unspecified episodic mood disorder     Patient Active Problem List   Diagnosis Date Noted  . Substance induced mood disorder (HCC) 10/04/2019  . PTSD (post-traumatic stress disorder) 04/09/2018  . Schizoaffective disorder, bipolar type (HCC) 04/08/2018  . Adjustment disorder with depressed mood 11/22/2017  . Suicidal thoughts 11/07/2016  . Bipolar 2 disorder (HCC) 08/08/2016  . Methamphetamine abuse (HCC) 03/08/2016  . Bipolar affect, depressed (HCC) 09/18/2015  . Bipolar affective disorder, currently depressed, mild (HCC) 09/17/2015  . Intentional overdose of drug in tablet form (HCC)   . Depression   . Suicidal ideation   . Cannabis use disorder, moderate, dependence (HCC) 06/06/2015  . Alcohol use disorder, moderate, dependence (HCC) 06/06/2015  . Cannabis abuse 03/30/2012  . ADHD (attention deficit hyperactivity disorder), combined type 08/28/2011  . Conduct disorder, adolescent onset type 08/28/2011    Past Surgical History:  Procedure Laterality Date  . NO PAST SURGERIES         Home Medications    Prior to Admission medications   Medication Sig Start Date End Date Taking?  Authorizing Provider  prazosin (MINIPRESS) 5 MG capsule Take 5 mg by mouth at bedtime. 01/16/20  Yes [provider]  traZODone (DESYREL) 150 MG tablet Take 150 mg by mouth at bedtime. 01/16/20  Yes [provider]  benzonatate (TESSALON) 200 MG capsule Take 1 capsule (200 mg total) by mouth 3 (three) times daily as needed for up to 7 days for cough. 06/13/20 06/20/20  Regnia Mathwig C, PA-C  cetirizine (ZYRTEC) 10 MG tablet Take 1 tablet (10 mg total) by mouth daily. 03/11/20   Eustace Moore, MD  dextromethorphan-guaiFENesin [facility] DM) 30-600 MG 12hr tablet Take 1 tablet by mouth 2 (two) times daily. 06/13/20   Kayia Billinger C, PA-C  fluticasone (FLONASE) 50 MCG/ACT nasal spray Place 1-2 sprays into both nostrils daily. 06/13/20   Suan Pyeatt C, PA-C  ibuprofen (ADVIL) 800 MG tablet Take 1 tablet (800 mg total) by mouth 3 (three) times daily. 06/13/20   Makesha Belitz C, PA-C  meloxicam (MOBIC) 15 MG tablet Take 1 tablet (15 mg total) by mouth daily. 03/11/20   Eustace Moore, MD  albuterol (PROVENTIL HFA;VENTOLIN HFA) 108 [phone] Base) MCG/ACT inhaler Inhale 1-2 puffs into the lungs every 4 (four) hours as needed for wheezing or shortness of breath. 04/11/18 03/11/20  Armandina Stammer I, NP  ARIPiprazole (ABILIFY) 10 MG tablet Take 1 tablet (10 mg total) by mouth daily. For mood control Patient not taking: Reported on 10/03/2019 04/12/18 03/11/20  Armandina Stammer I, NP  divalproex (DEPAKOTE) 500 MG DR  tablet Take 1 tablet (500 mg total) by mouth every 12 (twelve) hours. For mood stabilization Patient not taking: Reported on 10/03/2019 04/11/18 03/11/20  Armandina Stammer I, NP  omeprazole (PRILOSEC) 20 MG capsule Take 1 capsule (20 mg total) by mouth daily. Patient not taking: Reported on 10/03/2019 12/14/18 03/11/20  Glynn Octave, MD    Family History Family History  Problem Relation Age of Onset  . Depression Mother   . Diabetes Other   . Hyperlipidemia Other   . Hypertension  Other     Social History Social History   Tobacco Use  . Smoking status: Current Every Day Smoker    Packs/day: 0.50    Years: 0.00    Pack years: 0.00    Types: Cigarettes  . Smokeless tobacco: Never Used  Vaping Use  . Vaping Use: Never used  Substance Use Topics  . Alcohol use: Not Currently    Comment: minimal  . Drug use: Yes    Types: Marijuana    Comment: every other day     Allergies   Peanut-containing drug products and Lactose intolerance (gi)   Review of Systems Review of Systems  Constitutional: Positive for fatigue and fever. Negative for activity change, appetite change and chills.  HENT: Positive for congestion and rhinorrhea. Negative for ear pain, sinus pressure, sore throat and trouble swallowing.   Eyes: Negative for discharge and redness.  Respiratory: Positive for cough. Negative for chest tightness and shortness of breath.   Cardiovascular: Negative for chest pain.  Gastrointestinal: Positive for diarrhea. Negative for abdominal pain, nausea and vomiting.  Musculoskeletal: Positive for myalgias.  Skin: Negative for rash.  Neurological: Positive for headaches. Negative for dizziness and light-headedness.     Physical Exam Triage Vital Signs ED Triage Vitals  Enc Vitals Group     BP      Pulse      Resp      Temp      Temp src      SpO2      Weight      Height      Head Circumference      Peak Flow      Pain Score      Pain Loc      Pain Edu?      Excl. in GC?    No data found.  Updated Vital Signs BP (!) 137/96 (BP Location: Left Arm)   Pulse 96   Temp 98.4 F (36.9 C) (Oral)   Resp 20   SpO2 100%   Visual Acuity Right Eye Distance:   Left Eye Distance:   Bilateral Distance:    Right Eye Near:   Left Eye Near:    Bilateral Near:     Physical Exam Vitals and nursing note reviewed.  Constitutional:      Appearance: He is well-developed.     Comments: No acute distress  HENT:     Head: Normocephalic and  atraumatic.     Ears:     Comments: Bilateral ears without tenderness to palpation of external auricle, tragus and mastoid, EAC's without erythema or swelling, TM's with good bony landmarks and cone of light. Non erythematous.     Nose: Nose normal.     Mouth/Throat:     Comments: Oral mucosa pink and moist, no tonsillar enlargement or exudate. Posterior pharynx patent and nonerythematous, no uvula deviation or swelling. Normal phonation. Eyes:     Conjunctiva/sclera: Conjunctivae normal.  Cardiovascular:  Rate and Rhythm: Normal rate.  Pulmonary:     Effort: Pulmonary effort is normal. No respiratory distress.     Comments: Breathing comfortably at rest, CTABL, no wheezing, rales or other adventitious sounds auscultated Abdominal:     General: There is no distension.  Musculoskeletal:        General: Normal range of motion.     Cervical back: Neck supple.  Skin:    General: Skin is warm and dry.  Neurological:     Mental Status: He is alert and oriented to person, place, and time.      UC Treatments / Results  Labs (all labs ordered are listed, but only abnormal results are displayed) Labs Reviewed  SARS CORONAVIRUS 2 (TAT 6-24 HRS)    EKG   Radiology No results found.  Procedures Procedures (including critical care time)  Medications Ordered in UC Medications - No data to display  Initial Impression / Assessment and Plan / UC Course  I have reviewed the triage vital signs and the nursing notes.  Pertinent labs & imaging results that were available during my care of the patient were reviewed by me and considered in my medical decision making (see chart for details).     Suspect viral URI-vital signs stable, exam reassuring, Covid test pending.  Recommending symptomatic and supportive care rest and fluids.  Discussed strict return precautions. Patient verbalized understanding and is agreeable with plan.  Final Clinical Impressions(s) / UC Diagnoses    Final diagnoses:  Viral URI with cough     Discharge Instructions     Ibuprofen and Tylenol as needed for any fevers body aches, headache May use Tessalon/benzonatate every 8 hours for cough or may use over-the-counter Delsym Robitussin Dimetapp Mucinex DM twice daily to further help with congestion and cough Flonase nasal spray to help with sinus inflammation congestion Rest and fluids Follow-up if not improving or worsening    ED Prescriptions    Medication Sig Dispense Auth. Provider   ibuprofen (ADVIL) 800 MG tablet Take 1 tablet (800 mg total) by mouth 3 (three) times daily. 21 tablet Zi Newbury C, PA-C   benzonatate (TESSALON) 200 MG capsule Take 1 capsule (200 mg total) by mouth 3 (three) times daily as needed for up to 7 days for cough. 28 capsule Shawnya Mayor C, PA-C   fluticasone (FLONASE) 50 MCG/ACT nasal spray Place 1-2 sprays into both nostrils daily. 16 g Ahmar Pickrell C, PA-C   dextromethorphan-guaiFENesin (MUCINEX DM) 30-600 MG 12hr tablet Take 1 tablet by mouth 2 (two) times daily. 20 tablet Nyja Westbrook, [place] C, PA-C     PDMP not reviewed this encounter.   Lew Dawes, [place] 06/13/20 1154

## 2020-06-13 NOTE — Discharge Instructions (Signed)
Ibuprofen and Tylenol as needed for any fevers body aches, headache May use Tessalon/benzonatate every 8 hours for cough or may use over-the-counter Delsym Robitussin Dimetapp Mucinex DM twice daily to further help with congestion and cough Flonase nasal spray to help with sinus inflammation congestion Rest and fluids Follow-up if not improving or worsening

## 2020-06-13 NOTE — ED Triage Notes (Signed)
Patient in with complaints of productive cough, diarrhea, body aches and headache x 2 days.  Patient states that sputum that is clear/green in color. No episodes of diarrhea today. Patient has not received COVID vaccines. Patient has not taken any medication at home.

## 2020-10-15 ENCOUNTER — Other Ambulatory Visit: Payer: Self-pay

## 2020-10-15 ENCOUNTER — Ambulatory Visit (HOSPITAL_COMMUNITY)
Admission: EM | Admit: 2020-10-15 | Discharge: 2020-10-15 | Disposition: A | Discharge status: Home / Self Care | Payer: Medicaid Other | Attending: Family Medicine | Admitting: Family Medicine

## 2020-10-15 ENCOUNTER — Encounter (HOSPITAL_COMMUNITY): Payer: Self-pay

## 2020-10-15 ENCOUNTER — Ambulatory Visit (INDEPENDENT_AMBULATORY_CARE_PROVIDER_SITE_OTHER): Payer: Medicaid Other

## 2020-10-15 DIAGNOSIS — M25561 Pain in right knee: Secondary | ICD-10-CM | POA: Diagnosis not present

## 2020-10-15 DIAGNOSIS — G8929 Other chronic pain: Secondary | ICD-10-CM | POA: Diagnosis not present

## 2020-10-15 MED ORDER — PREDNISONE 50 MG PO TABS: ORAL_TABLET | ORAL | 0 refills | Status: DC

## 2020-10-15 NOTE — ED Triage Notes (Signed)
Pt presents with right knee pain x 1 month. States "bones scrapping" when bending right knee. Denies any trauma, swelling.  Pt reports headache x 3 days.   Pt has not taken any OTC meds for the complaints.

## 2020-10-15 NOTE — ED Provider Notes (Signed)
[facility]    CSN: [id number] Arrival date & time: 10/15/20  1339      History   Chief Complaint Chief Complaint  Patient presents with  . Knee Pain       . Headache    HPI Ryan Bartlett is a 25 y.o. male.   He is here today for evaluation of chronic right knee pain that he states started in childhood.  States he used to get knee injections on occasion when his symptoms flared up has not had one since age 33.  Denies any new injury, swelling, numbness, tingling, discoloration.  Pain is mostly only when not weightbearing.  He states it feels like his knee is bone-on-bone.  Does not currently follow with orthopedics for this issue.     Past Medical History:  Diagnosis Date  . ADHD (attention deficit hyperactivity disorder)   . Arthritis   . Asthma   . Bipolar disorder (HCC)   . Depression   . Oppositional defiant disorder   . PTSD (post-traumatic stress disorder)   . Schizoaffective disorder (HCC)   . Unspecified episodic mood disorder     Patient Active Problem List   Diagnosis Date Noted  . Substance induced mood disorder (HCC) 10/04/2019  . PTSD (post-traumatic stress disorder) 04/09/2018  . Schizoaffective disorder, bipolar type (HCC) 04/08/2018  . Adjustment disorder with depressed mood 11/22/2017  . Suicidal thoughts 11/07/2016  . Bipolar 2 disorder (HCC) 08/08/2016  . Methamphetamine abuse (HCC) 03/08/2016  . Bipolar affect, depressed (HCC) 09/18/2015  . Bipolar affective disorder, currently depressed, mild (HCC) 09/17/2015  . Intentional overdose of drug in tablet form (HCC)   . Depression   . Suicidal ideation   . Cannabis use disorder, moderate, dependence (HCC) 06/06/2015  . Alcohol use disorder, moderate, dependence (HCC) 06/06/2015  . Cannabis abuse 03/30/2012  . ADHD (attention deficit hyperactivity disorder), combined type 08/28/2011  . Conduct disorder, adolescent onset type 08/28/2011    Past Surgical History:  Procedure  Laterality Date  . NO PAST SURGERIES         Home Medications    Prior to Admission medications   Medication Sig Start Date End Date Taking? Authorizing Provider  predniSONE (DELTASONE) 50 MG tablet Take 1 tab with breakfast for 3 days 10/15/20  Yes Particia Nearing, PA-C  cetirizine (ZYRTEC) 10 MG tablet Take 1 tablet (10 mg total) by mouth daily. 03/11/20   Eustace Moore, MD  dextromethorphan-guaiFENesin [facility] DM) 30-600 MG 12hr tablet Take 1 tablet by mouth 2 (two) times daily. 06/13/20   Wieters, Hallie C, PA-C  fluticasone (FLONASE) 50 MCG/ACT nasal spray Place 1-2 sprays into both nostrils daily. 06/13/20   Wieters, Hallie C, PA-C  ibuprofen (ADVIL) 800 MG tablet Take 1 tablet (800 mg total) by mouth 3 (three) times daily. 06/13/20   Wieters, Hallie C, PA-C  meloxicam (MOBIC) 15 MG tablet Take 1 tablet (15 mg total) by mouth daily. 03/11/20   Eustace Moore, MD  prazosin (MINIPRESS) 5 MG capsule Take 5 mg by mouth at bedtime. 01/16/20   [provider]  traZODone (DESYREL) 150 MG tablet Take 150 mg by mouth at bedtime. 01/16/20   [provider]  albuterol (PROVENTIL HFA;VENTOLIN HFA) 108 (90 Base) MCG/ACT inhaler Inhale 1-2 puffs into the lungs every 4 (four) hours as needed for wheezing or shortness of breath. 04/11/18 03/11/20  Armandina Stammer I, NP  ARIPiprazole (ABILIFY) 10 MG tablet Take 1 tablet (10 mg total) by mouth  daily. For mood control Patient not taking: Reported on 10/03/2019 04/12/18 03/11/20  Armandina Stammer I, NP  divalproex (DEPAKOTE) 500 MG DR tablet Take 1 tablet (500 mg total) by mouth every 12 (twelve) hours. For mood stabilization Patient not taking: Reported on 10/03/2019 04/11/18 03/11/20  Armandina Stammer I, NP  omeprazole (PRILOSEC) 20 MG capsule Take 1 capsule (20 mg total) by mouth daily. Patient not taking: Reported on 10/03/2019 12/14/18 03/11/20  Glynn Octave, MD    Family History Family History  Problem Relation Age of Onset  .  Depression Mother   . Diabetes Other   . Hyperlipidemia Other   . Hypertension Other     Social History Social History   Tobacco Use  . Smoking status: Current Every Day Smoker    Packs/day: 0.50    Years: 0.00    Pack years: 0.00    Types: Cigarettes  . Smokeless tobacco: Never Used  Vaping Use  . Vaping Use: Never used  Substance Use Topics  . Alcohol use: Yes    Comment: socially   . Drug use: Not Currently    Types: Marijuana    Comment: every other day     Allergies   Peanut-containing drug products and Lactose intolerance (gi)   Review of Systems Review of Systems Per HPI  Physical Exam Triage Vital Signs ED Triage Vitals  Enc Vitals Group     BP 10/15/20 1400 125/87     Pulse Rate 10/15/20 1400 95     Resp 10/15/20 1400 (!) 23     Temp 10/15/20 1400 98.5 F (36.9 C)     Temp Source 10/15/20 1400 Oral     SpO2 10/15/20 1400 98 %     Weight --      Height --      Head Circumference --      Peak Flow --      Pain Score 10/15/20 1358 8     Pain Loc --      Pain Edu? --      Excl. in GC? --    No data found.  Updated Vital Signs BP 125/87 (BP Location: Left Arm)   Pulse 95   Temp 98.5 F (36.9 C) (Oral)   Resp (!) 23   SpO2 98%   Visual Acuity Right Eye Distance:   Left Eye Distance:   Bilateral Distance:    Right Eye Near:   Left Eye Near:    Bilateral Near:     Physical Exam Vitals and nursing note reviewed.  Constitutional:      Appearance: Normal appearance.  HENT:     Head: Atraumatic.  Eyes:     Extraocular Movements: Extraocular movements intact.     Conjunctiva/sclera: Conjunctivae normal.  Cardiovascular:     Rate and Rhythm: Normal rate and regular rhythm.     Pulses: Normal pulses.  Pulmonary:     Effort: Pulmonary effort is normal.     Breath sounds: Normal breath sounds.  Musculoskeletal:        General: Tenderness (minimal medial knee ttp) present. No swelling or deformity. Normal range of motion.     Cervical  back: Normal range of motion and neck supple.  Skin:    General: Skin is warm and dry.     Findings: No bruising or erythema.  Neurological:     General: No focal deficit present.     Mental Status: He is oriented to person, place, and time.  Sensory: No sensory deficit.     Motor: No weakness.     Gait: Gait normal.  Psychiatric:        Mood and Affect: Mood normal.        Thought Content: Thought content normal.        Judgment: Judgment normal.    UC Treatments / Results  Labs (all labs ordered are listed, but only abnormal results are displayed) Labs Reviewed - No data to display  EKG   Radiology DG Knee Complete 4 Views Right  Result Date: 10/15/2020 CLINICAL DATA:  Chronic right knee pain 1 month. EXAM: RIGHT KNEE - COMPLETE 4+ VIEW COMPARISON:  None. FINDINGS: No evidence of fracture, dislocation, or joint effusion. No evidence of arthropathy or other focal bone abnormality. Soft tissues are unremarkable. IMPRESSION: Negative. Electronically Signed   By: Elberta Fortis M.D.   On: 10/15/2020 15:24    Procedures Procedures (including critical care time)  Medications Ordered in UC Medications - No data to display  Initial Impression / Assessment and Plan / UC Course  I have reviewed the triage vital signs and the nursing notes.  Pertinent labs & imaging results that were available during my care of the patient were reviewed by me and considered in my medical decision making (see chart for details).     X-ray right knee today without acute abnormality or chronic degenerative changes.  Will give short prednisone burst and discussed rest, elevation, compression, OTC pain relievers as needed.  Follow with primary care if not fully resolving.  Work note given at his request. Final Clinical Impressions(s) / UC Diagnoses   Final diagnoses:  Acute pain of right knee   Discharge Instructions   None    ED Prescriptions    Medication Sig Dispense Auth. Provider    predniSONE (DELTASONE) 50 MG tablet Take 1 tab with breakfast for 3 days 3 tablet Particia Nearing, [place]     PDMP not reviewed this encounter.   Particia Nearing, [place] 10/15/20 1605

## 2020-10-19 ENCOUNTER — Ambulatory Visit (HOSPITAL_COMMUNITY)
Admission: EM | Admit: 2020-10-19 | Discharge: 2020-10-19 | Disposition: A | Discharge status: Home / Self Care | Payer: Medicaid Other | Attending: Nurse Practitioner | Admitting: Nurse Practitioner

## 2020-10-19 DIAGNOSIS — F250 Schizoaffective disorder, bipolar type: Secondary | ICD-10-CM | POA: Insufficient documentation

## 2020-10-19 DIAGNOSIS — F4310 Post-traumatic stress disorder, unspecified: Secondary | ICD-10-CM | POA: Diagnosis not present

## 2020-10-19 DIAGNOSIS — Z20822 Contact with and (suspected) exposure to covid-19: Secondary | ICD-10-CM | POA: Diagnosis not present

## 2020-10-19 DIAGNOSIS — F319 Bipolar disorder, unspecified: Secondary | ICD-10-CM

## 2020-10-19 LAB — RESP PANEL BY RT-PCR (FLU A&B, COVID) ARPGX2
Influenza A by PCR: NEGATIVE
Influenza B by PCR: NEGATIVE
SARS Coronavirus 2 by RT PCR: NEGATIVE

## 2020-10-19 LAB — COMPREHENSIVE METABOLIC PANEL
ALT: 27 U/L (ref 0–44)
AST: 13 U/L — ABNORMAL LOW (ref 15–41)
Albumin: 4.3 g/dL (ref 3.5–5.0)
Alkaline Phosphatase: 66 U/L (ref 38–126)
Anion gap: 9 (ref 5–15)
BUN: 13 mg/dL (ref 6–20)
CO2: 25 mmol/L (ref 22–32)
Calcium: 9.8 mg/dL (ref 8.9–10.3)
Chloride: 105 mmol/L (ref 98–111)
Creatinine, Ser: 0.9 mg/dL (ref 0.61–1.24)
GFR, Estimated: >60 mL/min (ref >60)
Glucose, Bld: 95 mg/dL (ref 70–99)
Potassium: 4.3 mmol/L (ref 3.5–5.1)
Sodium: 139 mmol/L (ref 135–145)
Total Bilirubin: 0.5 mg/dL (ref 0.3–1.2)
Total Protein: 7.2 g/dL (ref 6.5–8.1)

## 2020-10-19 LAB — POCT URINE DRUG SCREEN - MANUAL ENTRY (I-SCREEN)
POC Amphetamine UR: NOT DETECTED
POC Buprenorphine (BUP): NOT DETECTED
POC Cocaine UR: NOT DETECTED
POC Marijuana UR: POSITIVE — AB
POC Methadone UR: NOT DETECTED
POC Methamphetamine UR: NOT DETECTED
POC Morphine: NOT DETECTED
POC Oxazepam (BZO): NOT DETECTED
POC Oxycodone UR: NOT DETECTED
POC Secobarbital (BAR): NOT DETECTED

## 2020-10-19 LAB — CBC WITH DIFFERENTIAL/PLATELET
Abs Immature Granulocytes: 0.03 10*3/uL (ref 0.00–0.07)
Basophils Absolute: 0.1 10*3/uL (ref 0.0–0.1)
Basophils Relative: 1 %
Eosinophils Absolute: 0.2 10*3/uL (ref 0.0–0.5)
Eosinophils Relative: 2 %
HCT: 48.6 % (ref 39.0–52.0)
Hemoglobin: 15.5 g/dL (ref 13.0–17.0)
Immature Granulocytes: 0 %
Lymphocytes Relative: 23 %
Lymphs Abs: 2.5 10*3/uL (ref 0.7–4.0)
MCH: 27.5 pg (ref 26.0–34.0)
MCHC: 31.9 g/dL (ref 30.0–36.0)
MCV: 86.3 fL (ref 80.0–100.0)
Monocytes Absolute: 0.9 10*3/uL (ref 0.1–1.0)
Monocytes Relative: 8 %
Neutro Abs: 7.5 10*3/uL (ref 1.7–7.7)
Neutrophils Relative %: 66 %
Platelets: 275 10*3/uL (ref 150–400)
RBC: 5.63 MIL/uL (ref 4.22–5.81)
RDW: 13.1 % (ref 11.5–15.5)
WBC: 11.2 10*3/uL — ABNORMAL HIGH (ref 4.0–10.5)
nRBC: 0 % (ref 0.0–0.2)

## 2020-10-19 LAB — HEMOGLOBIN A1C
Hgb A1c MFr Bld: 6.5 % — ABNORMAL HIGH (ref 4.8–5.6)
Mean Plasma Glucose: 139.85 mg/dL

## 2020-10-19 LAB — POC SARS CORONAVIRUS 2 AG: SARS Coronavirus 2 Ag: NEGATIVE

## 2020-10-19 LAB — LIPID PANEL
Cholesterol: 256 mg/dL — ABNORMAL HIGH (ref 0–200)
HDL: 33 mg/dL — ABNORMAL LOW (ref >40)
LDL Cholesterol: 164 mg/dL — ABNORMAL HIGH (ref 0–99)
Total CHOL/HDL Ratio: 7.8 RATIO
Triglycerides: 297 mg/dL — ABNORMAL HIGH (ref <150)
VLDL: 59 mg/dL — ABNORMAL HIGH (ref 0–40)

## 2020-10-19 LAB — TSH: TSH: 2.482 u[IU]/mL (ref 0.350–4.500)

## 2020-10-19 LAB — POC SARS CORONAVIRUS 2 AG -  ED: SARS Coronavirus 2 Ag: NEGATIVE

## 2020-10-19 MED ORDER — HALOPERIDOL 5 MG PO TABS: 5.0000 mg | ORAL_TABLET | Freq: Four times a day (QID) | ORAL | Status: DC | PRN

## 2020-10-19 MED ORDER — ALUM & MAG HYDROXIDE-SIMETH 200-200-20 MG/5ML PO SUSP: 30.0000 mL | ORAL | Status: DC | PRN

## 2020-10-19 MED ORDER — BENZTROPINE MESYLATE 1 MG PO TABS: 1.0000 mg | ORAL_TABLET | Freq: Four times a day (QID) | ORAL | Status: DC | PRN

## 2020-10-19 MED ORDER — HYDROXYZINE HCL 50 MG PO TABS: 50.0000 mg | ORAL_TABLET | Freq: Three times a day (TID) | ORAL | 0 refills | Status: DC | PRN

## 2020-10-19 MED ORDER — ARIPIPRAZOLE 10 MG PO TABS: 10.0000 mg | ORAL_TABLET | Freq: Every day | ORAL | 0 refills | Status: DC

## 2020-10-19 MED ORDER — OXCARBAZEPINE 150 MG PO TABS: 150.0000 mg | ORAL_TABLET | Freq: Two times a day (BID) | ORAL | 0 refills | Status: DC

## 2020-10-19 MED ORDER — BUSPIRONE HCL 15 MG PO TABS: 15.0000 mg | ORAL_TABLET | Freq: Three times a day (TID) | ORAL | 0 refills | Status: DC

## 2020-10-19 MED ORDER — MAGNESIUM HYDROXIDE 400 MG/5ML PO SUSP: 30.0000 mL | Freq: Every day | ORAL | Status: DC | PRN

## 2020-10-19 MED ORDER — HYDROXYZINE HCL 25 MG PO TABS: 50.0000 mg | ORAL_TABLET | Freq: Three times a day (TID) | ORAL | Status: DC | PRN

## 2020-10-19 MED ORDER — OXCARBAZEPINE 150 MG PO TABS
150.0000 mg | ORAL_TABLET | Freq: Two times a day (BID) | ORAL | Status: DC
  Administered 2020-10-19: 150 mg via ORAL
  Filled 2020-10-19: qty 1

## 2020-10-19 MED ORDER — ACETAMINOPHEN 325 MG PO TABS: 650.0000 mg | ORAL_TABLET | Freq: Four times a day (QID) | ORAL | Status: DC | PRN

## 2020-10-19 MED ORDER — ARIPIPRAZOLE 10 MG PO TABS
10.0000 mg | ORAL_TABLET | Freq: Every day | ORAL | Status: DC
  Administered 2020-10-19: 10 mg via ORAL
  Filled 2020-10-19: qty 1

## 2020-10-19 MED ORDER — HYDROXYZINE HCL 25 MG PO TABS: 25.0000 mg | ORAL_TABLET | Freq: Three times a day (TID) | ORAL | Status: DC | PRN

## 2020-10-19 MED ORDER — BUSPIRONE HCL 15 MG PO TABS
15.0000 mg | ORAL_TABLET | Freq: Three times a day (TID) | ORAL | Status: DC
  Administered 2020-10-19: 15 mg via ORAL
  Filled 2020-10-19: qty 1
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
## 2020-10-19 NOTE — ED Notes (Signed)
Pt admitted to continuous assessment due to SI/HI. Pt A&O x4, calm and cooperative. Pt tolerated lab work and skin assessment well. Pt ambulated independently to unit. Oriented to unit/staff. No signs of acute distress noted. Will continue to monitor for safety.

## 2020-10-19 NOTE — ED Notes (Signed)
RN rechecked vital signs. Nira Conn, NP notified.

## 2020-10-19 NOTE — Discharge Instructions (Signed)

## 2020-10-19 NOTE — ED Notes (Signed)
[facility] obtained vital signs. Nira Conn, NP notified and requested recheck.

## 2020-10-19 NOTE — ED Notes (Signed)
Discharge instructions provided and Pt stated understanding. Personal belongings returned. Pt alert, orient and ambulatory. Safety maintained.

## 2022-02-23 ENCOUNTER — Other Ambulatory Visit: Payer: Self-pay

## 2022-02-23 ENCOUNTER — Encounter (HOSPITAL_COMMUNITY): Payer: Self-pay

## 2022-02-23 ENCOUNTER — Emergency Department (HOSPITAL_COMMUNITY): Payer: Medicaid Other

## 2022-02-23 ENCOUNTER — Emergency Department (HOSPITAL_COMMUNITY)
Admission: EM | Admit: 2022-02-23 | Discharge: 2022-02-23 | Disposition: A | Discharge status: Home / Self Care | Payer: Medicaid Other | Attending: Emergency Medicine | Admitting: Emergency Medicine

## 2022-02-23 DIAGNOSIS — M79641 Pain in right hand: Secondary | ICD-10-CM | POA: Diagnosis not present

## 2022-02-23 DIAGNOSIS — S6991XA Unspecified injury of right wrist, hand and finger(s), initial encounter: Secondary | ICD-10-CM | POA: Diagnosis present

## 2022-02-23 DIAGNOSIS — S62144A Nondisplaced fracture of body of hamate [unciform] bone, right wrist, initial encounter for closed fracture: Secondary | ICD-10-CM | POA: Insufficient documentation

## 2022-02-23 DIAGNOSIS — W228XXA Striking against or struck by other objects, initial encounter: Secondary | ICD-10-CM | POA: Diagnosis not present

## 2022-02-23 DIAGNOSIS — Z91010 Allergy to peanuts: Secondary | ICD-10-CM | POA: Diagnosis not present

## 2022-02-23 DIAGNOSIS — S62314A Displaced fracture of base of fourth metacarpal bone, right hand, initial encounter for closed fracture: Secondary | ICD-10-CM | POA: Insufficient documentation

## 2022-02-23 NOTE — ED Provider Triage Note (Signed)
Emergency Medicine Provider Triage Evaluation Note  Ryan Bartlett , a 26 y.o. male  was evaluated in triage.  Pt complains of right hand pain.  States he was boxing yesterday.  He thinks he punched the punching bag too hard.  Woke up this morning with pain and swelling to fourth and fifth metacarpal. no numbness or weakness.  Review of Systems  Positive: Right hand pain Negative: Numbness, weakness  Physical Exam  BP (!) 148/92 (BP Location: Left Arm)   Pulse 87   Temp 98.4 F (36.9 C) (Oral)   Resp 18   SpO2 98%  Gen:   Awake, no distress   Resp:  Normal effort  MSK:   Moves extremities without difficulty  Other:    Medical Decision Making  Medically screening exam initiated at 6:27 PM.  Appropriate orders placed.  MEMPHIS DECOTEAU was informed that the remainder of the evaluation will be completed by another provider, this initial triage assessment does not replace that evaluation, and the importance of remaining in the ED until their evaluation is complete.  Right hand pain   Shuan Statzer A, PA-C 02/23/22 1828

## 2022-02-23 NOTE — ED Provider Notes (Signed)
[facility]-EMERGENCY DEPT Provider Note   CSN: [id number] Arrival date & time: 02/23/22  1718    History  Chief Complaint  Patient presents with   Hand Injury    Ryan Bartlett is a 26 y.o. male with past medical history significant for bipolar, schizoaffective disorder here for evaluation of right hand pain.  Patient states he was punching a punching bag yesterday when he felt sudden onset pain to right radial aspect hand.  States he woke up this morning with significant swelling.  No pain at rest.  He denies any redness or warmth.  No fever, numbness or weakness.  HPI     Home Medications Prior to Admission medications   Medication Sig Start Date End Date Taking? Authorizing Provider  ARIPiprazole (ABILIFY) 10 MG tablet Take 1 tablet (10 mg total) by mouth daily. 10/20/20   Money, Gerlene Burdock, FNP  busPIRone (BUSPAR) 15 MG tablet Take 1 tablet (15 mg total) by mouth 3 (three) times daily. 10/19/20   Money, Gerlene Burdock, FNP  hydrOXYzine (ATARAX/VISTARIL) 50 MG tablet Take 1 tablet (50 mg total) by mouth 3 (three) times daily as needed for anxiety. 10/19/20   Money, Gerlene Burdock, FNP  OXcarbazepine (TRILEPTAL) 150 MG tablet Take 1 tablet (150 mg total) by mouth 2 (two) times daily. 10/19/20   Money, Gerlene Burdock, FNP  albuterol (PROVENTIL HFA;VENTOLIN HFA) 108 (90 Base) MCG/ACT inhaler Inhale 1-2 puffs into the lungs every 4 (four) hours as needed for wheezing or shortness of breath. 04/11/18 03/11/20  Armandina Stammer I, NP  divalproex (DEPAKOTE) 500 MG DR tablet Take 1 tablet (500 mg total) by mouth every 12 (twelve) hours. For mood stabilization Patient not taking: Reported on 10/03/2019 04/11/18 03/11/20  Armandina Stammer I, NP  omeprazole (PRILOSEC) 20 MG capsule Take 1 capsule (20 mg total) by mouth daily. Patient not taking: Reported on 10/03/2019 12/14/18 03/11/20  Glynn Octave, MD      Allergies    Peanut-containing drug products and Lactose intolerance (gi)    Review of Systems    Review of Systems  Constitutional: Negative.   HENT: Negative.    Respiratory: Negative.    Cardiovascular: Negative.   Gastrointestinal: Negative.   Genitourinary: Negative.   Musculoskeletal:        Right hand pain  Skin: Negative.   Neurological: Negative.   All other systems reviewed and are negative.   Physical Exam Updated Vital Signs BP (!) 148/92 (BP Location: Left Arm)   Pulse 87   Temp 98.4 F (36.9 C) (Oral)   Resp 18   SpO2 98%  Physical Exam Vitals and nursing note reviewed.  Constitutional:      General: He is not in acute distress.    Appearance: He is well-developed. He is not ill-appearing, toxic-appearing or diaphoretic.  HENT:     Head: Normocephalic and atraumatic.  Eyes:     Pupils: Pupils are equal, round, and reactive to light.  Cardiovascular:     Rate and Rhythm: Normal rate and regular rhythm.     Pulses: Normal pulses.          Radial pulses are 2+ on the right side and 2+ on the left side.  Pulmonary:     Effort: Pulmonary effort is normal. No respiratory distress.  Abdominal:     General: There is no distension.     Palpations: Abdomen is soft.  Musculoskeletal:     Right hand: Swelling, tenderness and bony tenderness present. Decreased range  of motion.     Left hand: Normal.     Cervical back: Normal range of motion and neck supple.     Comments: Tenderness, moderate soft tissue swelling fourth and fifth metacarpal right upper extremity.  Nontender wrist.  No scaphoid tenderness.  Able to pronate, supinate, flex and extend at wrist.  Wiggles digits without difficulty.  No contusions, abrasions, erythema or warmth  Skin:    General: Skin is warm and dry.     Capillary Refill: Capillary refill takes less than 2 seconds.     Findings: Signs of injury present. No abrasion, ecchymosis, erythema, laceration, lesion or rash.  Neurological:     General: No focal deficit present.     Mental Status: He is alert and oriented to person, place,  and time.     Sensory: Sensation is intact.     Motor: Motor function is intact.     Comments: Decreased grip strength right hand secondary to swelling and pain at hand      ED Results / Procedures / Treatments   Labs (all labs ordered are listed, but only abnormal results are displayed) Labs Reviewed - No data to display  EKG None  Radiology DG Hand Complete Right  Result Date: 02/23/2022 CLINICAL DATA:  Right hand pain and swelling after punching a punching bag EXAM: RIGHT HAND - COMPLETE 3+ VIEW COMPARISON:  None Available. FINDINGS: Frontal, oblique, and lateral views of the right hand are obtained. There is a comminuted minimally displaced intra-articular fracture at the base of the fourth metacarpal, with slight dorsal impaction at the fracture site. There is also a small intra-articular fracture involving the dorsal distal margin of the hamate. Marked overlying soft tissue swelling within the dorsum of the hand. IMPRESSION: 1. Comminuted intra-articular fracture at the base of the fourth metacarpal, with dorsal angulation at the fracture site. 2. Minimally displaced intra-articular fracture dorsal distal margin of the hamate. 3. Marked soft tissue swelling of the hand. Electronically Signed   By: Sharlet Salina M.D.   On: 02/23/2022 18:57    Procedures .Splint Application  Date/Time: 02/23/2022 10:18 PM  Performed by: Linwood Dibbles, PA-C Authorized by: Linwood Dibbles, PA-C   Consent:    Consent obtained:  Verbal   Consent given by:  Patient   Risks, benefits, and alternatives were discussed: yes     Risks discussed:  Discoloration, numbness, pain and swelling   Alternatives discussed:  Referral, observation, alternative treatment, delayed treatment and no treatment Universal protocol:    Procedure explained and questions answered to patient or proxy's satisfaction: yes     Relevant documents present and verified: yes     Test results available: yes     Imaging  studies available: yes     Required blood products, implants, devices, and special equipment available: yes     Site/side marked: yes     Immediately prior to procedure a time out was called: yes     Patient identity confirmed:  Verbally with patient Pre-procedure details:    Distal neurologic exam:  Normal   Distal perfusion: distal pulses strong and brisk capillary refill   Procedure details:    Location:  Hand   Hand location:  R hand   Strapping: no     Cast type:  Short arm   Splint type:  Ulnar gutter   Supplies:  Cotton padding, fiberglass, elastic bandage and aluminum splint   Attestation: Splint applied and adjusted personally by me  Post-procedure details:    Distal neurologic exam:  Normal   Distal perfusion: distal pulses strong and brisk capillary refill     Procedure completion:  Tolerated well, no immediate complications     Medications Ordered in ED Medications - No data to display  ED Course/ Medical Decision Making/ A&P    26 year old with long psychiatric history here for evaluation right hand pain at fourth and fifth metacarpal with significant overlying swelling after punching a punching bag yesterday.  He has no evidence of erythema, warmth, abrasions to suggest overlying infectious process, open fracture.  He is neurovascularly intact.  Nontender, wrist and scaphoid.  Compartment soft suspect boxer's fracture, will get imaging  Imaging personally viewed and interpreted:  X-ray right hand with comminuted intra-articular fracture fourth metacarpal with some angulation, minimally displaced intra-articular fracture dorsal distal margin hamate.  Significant soft tissue swelling  Patient placed in ulnar gutter splint.  Neurovascularly intact after splint placement.  I discussed NSAIDs, ice, elevation.  He will need to follow-up with hand surgery for definitive management.  Encouraged return for new or worsening symptoms.  The patient has been appropriately  medically screened and/or stabilized in the ED. I have low suspicion for any other emergent medical condition which would require further screening, evaluation or treatment in the ED or require inpatient management.  Patient is hemodynamically stable and in no acute distress.  Patient able to ambulate in department prior to ED.  Evaluation does not show acute pathology that would require ongoing or additional emergent interventions while in the emergency department or further inpatient treatment.  I have discussed the diagnosis with the patient and answered all questions.  Pain is been managed while in the emergency department and patient has no further complaints prior to discharge.  Patient is comfortable with plan discussed in room and is stable for discharge at this time.  I have discussed strict return precautions for returning to the emergency department.  Patient was encouraged to follow-up with PCP/specialist refer to at discharge.                            Medical Decision Making Amount and/or Complexity of Data Reviewed External Data Reviewed: radiology and notes. Radiology: ordered and independent interpretation performed. Decision-making details documented in ED Course.  Risk OTC drugs. Decision regarding hospitalization. Diagnosis or treatment significantly limited by social determinants of health.          Final Clinical Impression(s) / ED Diagnoses Final diagnoses:  Closed displaced fracture of base of fourth metacarpal bone of right hand, initial encounter  Closed nondisplaced fracture of body of hamate of right wrist, initial encounter    Rx / DC Orders ED Discharge Orders     None         Serra Younan A, PA-C 02/23/22 2221    Gareth Morgan, MD 02/24/22 [phone]

## 2022-02-23 NOTE — Discharge Instructions (Signed)
Make sure to elevate your hand and ice it.  Follow-up with the hand surgeon on Monday.  Return for new or worsening symptoms

## 2022-02-23 NOTE — ED Triage Notes (Signed)
Pt reports punching a punching bag last night and now has right hand pain and swelling.

## 2022-02-23 NOTE — Progress Notes (Signed)
Orthopedic Tech Progress Note Patient Details:  Ryan Bartlett 1996/07/16 [id number]  Ortho Devices Type of Ortho Device: Ulna gutter splint Ortho Device/Splint Location: RUE Ortho Device/Splint Interventions: Application   Post Interventions Patient Tolerated: Well  Ryan Bartlett Ryan Bartlett 02/23/2022, 9:16 PM

## 2022-03-17 ENCOUNTER — Other Ambulatory Visit: Payer: Self-pay

## 2022-03-17 ENCOUNTER — Emergency Department (HOSPITAL_COMMUNITY)
Admission: EM | Admit: 2022-03-17 | Discharge: 2022-03-17 | Payer: Medicaid Other | Attending: Emergency Medicine | Admitting: Emergency Medicine

## 2022-03-17 ENCOUNTER — Emergency Department (HOSPITAL_COMMUNITY): Payer: Medicaid Other

## 2022-03-17 ENCOUNTER — Encounter (HOSPITAL_COMMUNITY): Payer: Self-pay

## 2022-03-17 DIAGNOSIS — S0990XA Unspecified injury of head, initial encounter: Secondary | ICD-10-CM | POA: Diagnosis present

## 2022-03-17 DIAGNOSIS — W2111XA Struck by baseball bat, initial encounter: Secondary | ICD-10-CM | POA: Insufficient documentation

## 2022-03-17 DIAGNOSIS — J45909 Unspecified asthma, uncomplicated: Secondary | ICD-10-CM | POA: Insufficient documentation

## 2022-03-17 DIAGNOSIS — Z91010 Allergy to peanuts: Secondary | ICD-10-CM | POA: Diagnosis not present

## 2022-03-17 DIAGNOSIS — Y9364 Activity, baseball: Secondary | ICD-10-CM | POA: Diagnosis not present

## 2022-03-17 DIAGNOSIS — Z79899 Other long term (current) drug therapy: Secondary | ICD-10-CM | POA: Diagnosis not present
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
## 2022-03-17 NOTE — Discharge Instructions (Addendum)
For pain control you may take at 1000 mg of Tylenol every 8 hours as needed. 

## 2022-03-17 NOTE — ED Notes (Signed)
Head cleaned with wound cleaner
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
## 2024-07-05 ENCOUNTER — Other Ambulatory Visit: Payer: Self-pay

## 2024-07-05 ENCOUNTER — Emergency Department (HOSPITAL_COMMUNITY)
Admission: EM | Admit: 2024-07-05 | Discharge: 2024-07-05 | Disposition: A | Discharge status: Home / Self Care | Payer: MEDICAID | Attending: Emergency Medicine | Admitting: Emergency Medicine

## 2024-07-05 DIAGNOSIS — J029 Acute pharyngitis, unspecified: Secondary | ICD-10-CM | POA: Diagnosis not present

## 2024-07-05 DIAGNOSIS — R059 Cough, unspecified: Secondary | ICD-10-CM | POA: Insufficient documentation

## 2024-07-05 DIAGNOSIS — F17210 Nicotine dependence, cigarettes, uncomplicated: Secondary | ICD-10-CM | POA: Insufficient documentation

## 2024-07-05 DIAGNOSIS — Z91010 Allergy to peanuts: Secondary | ICD-10-CM | POA: Diagnosis not present

## 2024-07-05 DIAGNOSIS — M7918 Myalgia, other site: Secondary | ICD-10-CM | POA: Insufficient documentation

## 2024-07-05 DIAGNOSIS — R1110 Vomiting, unspecified: Secondary | ICD-10-CM | POA: Insufficient documentation

## 2024-07-05 DIAGNOSIS — R6889 Other general symptoms and signs: Secondary | ICD-10-CM

## 2024-07-05 LAB — RESP PANEL BY RT-PCR (RSV, FLU A&B, COVID)  RVPGX2
Influenza A by PCR: NEGATIVE
Influenza B by PCR: NEGATIVE
Resp Syncytial Virus by PCR: NEGATIVE
SARS Coronavirus 2 by RT PCR: NEGATIVE

## 2024-07-05 MED ORDER — BENZONATATE 100 MG PO CAPS: 100.0000 mg | ORAL_CAPSULE | Freq: Three times a day (TID) | ORAL | 0 refills | Status: DC

## 2024-07-05 MED ORDER — ACETAMINOPHEN 500 MG PO TABS
500.0000 mg | ORAL_TABLET | Freq: Four times a day (QID) | ORAL | 0 refills | Status: DC | PRN
Start: 2024-07-05 — End: 2024-07-10

## 2024-07-05 MED ORDER — LIDOCAINE VISCOUS HCL 2 % MT SOLN: 15.0000 mL | OROMUCOSAL | 0 refills | Status: DC | PRN

## 2024-07-05 MED ORDER — ACETAMINOPHEN 500 MG PO TABS
1000.0000 mg | ORAL_TABLET | Freq: Once | ORAL | Status: AC
  Administered 2024-07-05: 1000 mg via ORAL
  Filled 2024-07-05: qty 2

## 2024-07-05 NOTE — ED Notes (Signed)
 Pt given water

## 2024-07-05 NOTE — ED Triage Notes (Signed)
 Patient to ED from home by EMS for flu like symptoms for 2 days, N/V trouble walking, HX of anxiety and depression.

## 2024-07-05 NOTE — ED Provider Notes (Signed)
 [place] EMERGENCY DEPARTMENT AT [facility] Provider Note   CSN: [id number] Arrival date & time: 07/05/24  1644     Patient presents with: URI   Ryan Bartlett is a 28 y.o. male.   The history is provided by the patient and medical records. No language interpreter was used.  URI    Ryan Bartlett is a 27 y.o. male presents to the ED with URI symptoms that started today. He states that he woke up this morning with body aches, sore throat, cough, one episode of non bloody non bilious vomit. He denies fever, chills, nausea, SOB, CP, HA, ear pain and congestion. He reports that his household all have similar symptoms that began a week ago, and his brother has RSV. Pt states that he smokes 2 cigarettes a day for this past year, denies illicit drug use and doesn't drink alcohol.   Prior to Admission medications   Medication Sig Start Date End Date Taking? Authorizing Provider  ARIPiprazole  (ABILIFY ) 10 MG tablet Take 1 tablet (10 mg total) by mouth daily. 10/20/20   Money, Caron NOVAK, FNP  busPIRone  (BUSPAR ) 15 MG tablet Take 1 tablet (15 mg total) by mouth 3 (three) times daily. 10/19/20   Money, Caron NOVAK, FNP  hydrOXYzine  (ATARAX /VISTARIL ) 50 MG tablet Take 1 tablet (50 mg total) by mouth 3 (three) times daily as needed for anxiety. 10/19/20   Money, Caron NOVAK, FNP  OXcarbazepine  (TRILEPTAL ) 150 MG tablet Take 1 tablet (150 mg total) by mouth 2 (two) times daily. 10/19/20   Money, Caron NOVAK, FNP  albuterol  (PROVENTIL  HFA;VENTOLIN  HFA) 108 (90 Base) MCG/ACT inhaler Inhale 1-2 puffs into the lungs every 4 (four) hours as needed for wheezing or shortness of breath. 04/11/18 03/11/20  Collene Gouge I, NP  divalproex  (DEPAKOTE ) 500 MG DR tablet Take 1 tablet (500 mg total) by mouth every 12 (twelve) hours. For mood stabilization Patient not taking: Reported on 10/03/2019 04/11/18 03/11/20  Collene Gouge I, NP  omeprazole  (PRILOSEC) 20 MG capsule Take 1 capsule (20 mg total) by mouth  daily. Patient not taking: Reported on 10/03/2019 12/14/18 03/11/20  Carita Senior, MD    Allergies: Peanut-containing drug products and Lactose intolerance (gi)    Review of Systems  All other systems reviewed and are negative.   Updated Vital Signs BP (!) 129/112 (BP Location: Left Arm)   Pulse 92   Temp 97.9 F (36.6 C) (Oral)   Resp 16   Ht 5' 9 (1.753 m)   Wt 101 kg   SpO2 95%   BMI 32.88 kg/m   Physical Exam Constitutional:      General: He is not in acute distress.    Appearance: He is well-developed.  HENT:     Head: Atraumatic.     Mouth/Throat:     Comments: Mild posterior oropharyngeal erythema but no tonsillar enlargement or exudates noted.  No stridor or trismus. Eyes:     Conjunctiva/sclera: Conjunctivae normal.  Cardiovascular:     Rate and Rhythm: Normal rate and regular rhythm.     Pulses: Normal pulses.     Heart sounds: Normal heart sounds.  Pulmonary:     Effort: Pulmonary effort is normal.     Breath sounds: Normal breath sounds.  Abdominal:     Palpations: Abdomen is soft.     Tenderness: There is no abdominal tenderness.  Musculoskeletal:     Cervical back: Normal range of motion and neck supple.  Skin:    Findings:  No rash.  Neurological:     Mental Status: He is alert.     (all labs ordered are listed, but only abnormal results are displayed) Labs Reviewed  RESP PANEL BY RT-PCR (RSV, FLU A&B, COVID)  [id number]    EKG: None  Radiology: No results found.   Procedures   Medications Ordered in the ED - No data to display                                  Medical Decision Making  BP (!) 129/112 (BP Location: Left Arm)   Pulse 92   Temp 97.9 F (36.6 C) (Oral)   Resp 16   Ht 5' 9 (1.753 m)   Wt 101 kg   SpO2 95%   BMI 32.88 kg/m   7:14 PM Ryan Bartlett is a 28 y.o. male presents to the ED with URI symptoms that started today. He states that he woke up this morning with body aches, sore throat, cough, one episode  of non bloody non bilious vomit. He denies fever, chills, nausea, SOB, CP, HA, ear pain and congestion. He reports that his household all have similar symptoms that began a week ago, and his brother has RSV. Pt states that he smokes 2 cigarettes a day for this past year, denies illicit drug use and doesn't drink alcohol.   Patient overall well-appearing on exam, lungs are clear, throat exam with mild posterior oropharyngeal erythema but no concerning for deep tissue infection.  Vital signs normal.  Labs obtained and reviewed inter by me and patient test negative for COVID, flu, RSV.  Overall patient stable for discharge.  Suspect viral illness.  Return precaution given.     Final diagnoses:  Flu-like symptoms    ED Discharge Orders          Ordered    benzonatate  (TESSALON ) 100 MG capsule  Every 8 hours        07/05/24 1917    acetaminophen  (TYLENOL ) 500 MG tablet  Every 6 hours PRN        07/05/24 1917    lidocaine  (XYLOCAINE ) 2 % solution  As needed        07/05/24 1917               Nivia Colon, PA-C 07/05/24 1918    Pamella Ozell LABOR, DO 07/06/24 2326

## 2024-07-08 ENCOUNTER — Other Ambulatory Visit: Payer: Self-pay

## 2024-07-08 ENCOUNTER — Emergency Department (HOSPITAL_COMMUNITY): Payer: MEDICAID

## 2024-07-08 ENCOUNTER — Inpatient Hospital Stay (HOSPITAL_COMMUNITY)
Admission: EM | Admit: 2024-07-08 | Discharge: 2024-07-15 | DRG: 637 | Disposition: A | Discharge status: Home / Self Care | Payer: MEDICAID | Attending: Internal Medicine | Admitting: Internal Medicine

## 2024-07-08 DIAGNOSIS — Z833 Family history of diabetes mellitus: Secondary | ICD-10-CM

## 2024-07-08 DIAGNOSIS — Z91010 Allergy to peanuts: Secondary | ICD-10-CM

## 2024-07-08 DIAGNOSIS — Z6832 Body mass index (BMI) 32.0-32.9, adult: Secondary | ICD-10-CM

## 2024-07-08 DIAGNOSIS — D649 Anemia, unspecified: Secondary | ICD-10-CM | POA: Diagnosis not present

## 2024-07-08 DIAGNOSIS — R1110 Vomiting, unspecified: Secondary | ICD-10-CM

## 2024-07-08 DIAGNOSIS — G929 Unspecified toxic encephalopathy: Secondary | ICD-10-CM | POA: Diagnosis present

## 2024-07-08 DIAGNOSIS — Z818 Family history of other mental and behavioral disorders: Secondary | ICD-10-CM

## 2024-07-08 DIAGNOSIS — Z8249 Family history of ischemic heart disease and other diseases of the circulatory system: Secondary | ICD-10-CM

## 2024-07-08 DIAGNOSIS — I10 Essential (primary) hypertension: Secondary | ICD-10-CM | POA: Diagnosis present

## 2024-07-08 DIAGNOSIS — F1523 Other stimulant dependence with withdrawal: Secondary | ICD-10-CM | POA: Diagnosis not present

## 2024-07-08 DIAGNOSIS — Z781 Physical restraint status: Secondary | ICD-10-CM

## 2024-07-08 DIAGNOSIS — R739 Hyperglycemia, unspecified: Principal | ICD-10-CM

## 2024-07-08 DIAGNOSIS — Y900 Blood alcohol level of less than 20 mg/100 ml: Secondary | ICD-10-CM | POA: Diagnosis present

## 2024-07-08 DIAGNOSIS — F10239 Alcohol dependence with withdrawal, unspecified: Secondary | ICD-10-CM | POA: Diagnosis not present

## 2024-07-08 DIAGNOSIS — E860 Dehydration: Secondary | ICD-10-CM | POA: Diagnosis present

## 2024-07-08 DIAGNOSIS — E875 Hyperkalemia: Secondary | ICD-10-CM | POA: Diagnosis present

## 2024-07-08 DIAGNOSIS — E1110 Type 2 diabetes mellitus with ketoacidosis without coma: Principal | ICD-10-CM | POA: Diagnosis present

## 2024-07-08 DIAGNOSIS — F913 Oppositional defiant disorder: Secondary | ICD-10-CM | POA: Diagnosis present

## 2024-07-08 DIAGNOSIS — F4310 Post-traumatic stress disorder, unspecified: Secondary | ICD-10-CM | POA: Diagnosis present

## 2024-07-08 DIAGNOSIS — F259 Schizoaffective disorder, unspecified: Secondary | ICD-10-CM | POA: Diagnosis present

## 2024-07-08 DIAGNOSIS — N179 Acute kidney failure, unspecified: Secondary | ICD-10-CM | POA: Diagnosis present

## 2024-07-08 DIAGNOSIS — G4733 Obstructive sleep apnea (adult) (pediatric): Secondary | ICD-10-CM | POA: Diagnosis present

## 2024-07-08 DIAGNOSIS — E739 Lactose intolerance, unspecified: Secondary | ICD-10-CM | POA: Diagnosis present

## 2024-07-08 DIAGNOSIS — E871 Hypo-osmolality and hyponatremia: Secondary | ICD-10-CM | POA: Diagnosis present

## 2024-07-08 DIAGNOSIS — Z79899 Other long term (current) drug therapy: Secondary | ICD-10-CM

## 2024-07-08 DIAGNOSIS — R410 Disorientation, unspecified: Secondary | ICD-10-CM

## 2024-07-08 DIAGNOSIS — Z7984 Long term (current) use of oral hypoglycemic drugs: Secondary | ICD-10-CM

## 2024-07-08 DIAGNOSIS — R4701 Aphasia: Secondary | ICD-10-CM | POA: Diagnosis present

## 2024-07-08 DIAGNOSIS — Z1152 Encounter for screening for COVID-19: Secondary | ICD-10-CM

## 2024-07-08 DIAGNOSIS — J45909 Unspecified asthma, uncomplicated: Secondary | ICD-10-CM | POA: Diagnosis present

## 2024-07-08 DIAGNOSIS — F909 Attention-deficit hyperactivity disorder, unspecified type: Secondary | ICD-10-CM | POA: Diagnosis present

## 2024-07-08 DIAGNOSIS — E66811 Obesity, class 1: Secondary | ICD-10-CM | POA: Diagnosis present

## 2024-07-08 DIAGNOSIS — F319 Bipolar disorder, unspecified: Secondary | ICD-10-CM | POA: Diagnosis present

## 2024-07-08 DIAGNOSIS — F17210 Nicotine dependence, cigarettes, uncomplicated: Secondary | ICD-10-CM | POA: Diagnosis present

## 2024-07-08 LAB — CBG MONITORING, ED
Glucose-Capillary: 388 mg/dL — ABNORMAL HIGH (ref 70–99)
Glucose-Capillary: 389 mg/dL — ABNORMAL HIGH (ref 70–99)
Glucose-Capillary: 437 mg/dL — ABNORMAL HIGH (ref 70–99)
Glucose-Capillary: 493 mg/dL — ABNORMAL HIGH (ref 70–99)
Glucose-Capillary: >600 mg/dL (ref 70–99)
Glucose-Capillary: >600 mg/dL (ref 70–99)
Glucose-Capillary: >600 mg/dL (ref 70–99)
Glucose-Capillary: >600 mg/dL (ref 70–99)
Glucose-Capillary: >600 mg/dL (ref 70–99)
Glucose-Capillary: >600 mg/dL (ref 70–99)
Glucose-Capillary: >600 mg/dL (ref 70–99)
Glucose-Capillary: >600 mg/dL (ref 70–99)

## 2024-07-08 LAB — BASIC METABOLIC PANEL WITH GFR
Anion gap: 22 — ABNORMAL HIGH (ref 5–15)
BUN: 56 mg/dL — ABNORMAL HIGH (ref 6–20)
CO2: 16 mmol/L — ABNORMAL LOW (ref 22–32)
Calcium: 9 mg/dL (ref 8.9–10.3)
Chloride: 73 mmol/L — ABNORMAL LOW (ref 98–111)
Creatinine, Ser: 1.77 mg/dL — ABNORMAL HIGH (ref 0.61–1.24)
GFR, Estimated: 52 mL/min — ABNORMAL LOW (ref >60)
Glucose, Bld: 818 mg/dL (ref 70–99)
Potassium: 6.4 mmol/L (ref 3.5–5.1)
Sodium: 111 mmol/L — CL (ref 135–145)

## 2024-07-08 LAB — I-STAT VENOUS BLOOD GAS, ED
Acid-base deficit: 3 mmol/L — ABNORMAL HIGH (ref 0.0–2.0)
Bicarbonate: 24 mmol/L (ref 20.0–28.0)
Calcium, Ion: 0.97 mmol/L — ABNORMAL LOW (ref 1.15–1.40)
HCT: 54 % — ABNORMAL HIGH (ref 39.0–52.0)
Hemoglobin: 18.4 g/dL — ABNORMAL HIGH (ref 13.0–17.0)
O2 Saturation: 76 %
Potassium: 6.4 mmol/L (ref 3.5–5.1)
Sodium: 108 mmol/L — CL (ref 135–145)
TCO2: 25 mmol/L (ref 22–32)
pCO2, Ven: 46.5 mmHg (ref 44–60)
pH, Ven: 7.322 (ref 7.25–7.43)
pO2, Ven: 44 mmHg (ref 32–45)

## 2024-07-08 LAB — CBC WITH DIFFERENTIAL/PLATELET
Abs Immature Granulocytes: 0.06 K/uL (ref 0.00–0.07)
Basophils Absolute: 0.1 K/uL (ref 0.0–0.1)
Basophils Relative: 0 %
Eosinophils Absolute: 0 K/uL (ref 0.0–0.5)
Eosinophils Relative: 0 %
HCT: 48.7 % (ref 39.0–52.0)
Hemoglobin: 17.9 g/dL — ABNORMAL HIGH (ref 13.0–17.0)
Immature Granulocytes: 0 %
Lymphocytes Relative: 10 %
Lymphs Abs: 1.6 K/uL (ref 0.7–4.0)
MCH: 28.5 pg (ref 26.0–34.0)
MCHC: 36.8 g/dL — ABNORMAL HIGH (ref 30.0–36.0)
MCV: 77.4 fL — ABNORMAL LOW (ref 80.0–100.0)
Monocytes Absolute: 1.7 K/uL — ABNORMAL HIGH (ref 0.1–1.0)
Monocytes Relative: 11 %
Neutro Abs: 12.9 K/uL — ABNORMAL HIGH (ref 1.7–7.7)
Neutrophils Relative %: 79 %
Platelets: 336 K/uL (ref 150–400)
RBC: 6.29 MIL/uL — ABNORMAL HIGH (ref 4.22–5.81)
RDW: 12.4 % (ref 11.5–15.5)
WBC: 16.4 K/uL — ABNORMAL HIGH (ref 4.0–10.5)
nRBC: 0 % (ref 0.0–0.2)

## 2024-07-08 LAB — I-STAT CHEM 8, ED
BUN: 57 mg/dL — ABNORMAL HIGH (ref 6–20)
BUN: 54 mg/dL — ABNORMAL HIGH (ref 6–20)
Calcium, Ion: 1 mmol/L — ABNORMAL LOW (ref 1.15–1.40)
Calcium, Ion: 1.07 mmol/L — ABNORMAL LOW (ref 1.15–1.40)
Chloride: 81 mmol/L — ABNORMAL LOW (ref 98–111)
Chloride: 87 mmol/L — ABNORMAL LOW (ref 98–111)
Creatinine, Ser: 2.1 mg/dL — ABNORMAL HIGH (ref 0.61–1.24)
Creatinine, Ser: 1.8 mg/dL — ABNORMAL HIGH (ref 0.61–1.24)
Glucose, Bld: >700 mg/dL (ref 70–99)
Glucose, Bld: 482 mg/dL — ABNORMAL HIGH (ref 70–99)
HCT: 57 % — ABNORMAL HIGH (ref 39.0–52.0)
HCT: 55 % — ABNORMAL HIGH (ref 39.0–52.0)
Hemoglobin: 19.4 g/dL — ABNORMAL HIGH (ref 13.0–17.0)
Hemoglobin: 18.7 g/dL — ABNORMAL HIGH (ref 13.0–17.0)
Potassium: 6.4 mmol/L (ref 3.5–5.1)
Potassium: 4.6 mmol/L (ref 3.5–5.1)
Sodium: 111 mmol/L — CL (ref 135–145)
Sodium: 121 mmol/L — ABNORMAL LOW (ref 135–145)
TCO2: 22 mmol/L (ref 22–32)
TCO2: 25 mmol/L (ref 22–32)

## 2024-07-08 LAB — URINALYSIS, ROUTINE W REFLEX MICROSCOPIC
Bacteria, UA: NONE SEEN
Bilirubin Urine: NEGATIVE
Glucose, UA: >=500 mg/dL — AB
Ketones, ur: 5 mg/dL — AB
Leukocytes,Ua: NEGATIVE
Nitrite: NEGATIVE
Protein, ur: NEGATIVE mg/dL
Specific Gravity, Urine: 1.022 (ref 1.005–1.030)
pH: 5 (ref 5.0–8.0)

## 2024-07-08 LAB — ETHANOL: Alcohol, Ethyl (B): <15 mg/dL (ref <15)

## 2024-07-08 LAB — BETA-HYDROXYBUTYRIC ACID: Beta-Hydroxybutyric Acid: 3.81 mmol/L — ABNORMAL HIGH (ref 0.05–0.27)

## 2024-07-08 MED ORDER — ADULT MULTIVITAMIN W/MINERALS CH
1.0000 | ORAL_TABLET | Freq: Every day | ORAL | Status: DC
  Administered 2024-07-12 – 2024-07-15 (×4): 1 via ORAL
  Filled 2024-07-08 (×5): qty 1

## 2024-07-08 MED ORDER — LORAZEPAM 2 MG/ML IJ SOLN
1.0000 mg | INTRAMUSCULAR | Status: DC | PRN
  Administered 2024-07-08: 1 mg via INTRAVENOUS
  Administered 2024-07-09 (×2): 3 mg via INTRAVENOUS
  Administered 2024-07-09: 2 mg via INTRAVENOUS
  Administered 2024-07-09: 3 mg via INTRAVENOUS
  Administered 2024-07-09 – 2024-07-10 (×2): 4 mg via INTRAVENOUS
  Filled 2024-07-08 (×2): qty 1
  Filled 2024-07-08 (×5): qty 2

## 2024-07-08 MED ORDER — THIAMINE HCL 100 MG/ML IJ SOLN
100.0000 mg | Freq: Every day | INTRAMUSCULAR | Status: DC
  Administered 2024-07-09 – 2024-07-13 (×5): 100 mg via INTRAVENOUS
  Filled 2024-07-08 (×5): qty 2

## 2024-07-08 MED ORDER — LORAZEPAM 1 MG PO TABS: 1.0000 mg | ORAL_TABLET | ORAL | Status: DC | PRN

## 2024-07-08 MED ORDER — DICYCLOMINE HCL 10 MG/ML IM SOLN
10.0000 mg | Freq: Once | INTRAMUSCULAR | Status: AC
  Administered 2024-07-08: 10 mg via INTRAMUSCULAR

## 2024-07-08 MED ORDER — DEXTROSE 50 % IV SOLN: 0.0000 mL | INTRAVENOUS | Status: DC | PRN

## 2024-07-08 MED ORDER — ONDANSETRON HCL 4 MG/2ML IJ SOLN
4.0000 mg | Freq: Once | INTRAMUSCULAR | Status: AC
  Administered 2024-07-08: 4 mg via INTRAVENOUS
  Filled 2024-07-08: qty 2

## 2024-07-08 MED ORDER — LACTATED RINGERS IV SOLN: INTRAVENOUS | Status: AC

## 2024-07-08 MED ORDER — FOLIC ACID 1 MG PO TABS
1.0000 mg | ORAL_TABLET | Freq: Every day | ORAL | Status: DC
  Filled 2024-07-08: qty 1

## 2024-07-08 MED ORDER — INSULIN REGULAR(HUMAN) IN NACL 100-0.9 UT/100ML-% IV SOLN
INTRAVENOUS | Status: DC
  Administered 2024-07-08: 9.5 [IU]/h via INTRAVENOUS

## 2024-07-08 MED ORDER — LORAZEPAM 2 MG/ML IJ SOLN
1.0000 mg | Freq: Once | INTRAMUSCULAR | Status: AC
Start: 2024-07-08 — End: 2024-07-08
  Administered 2024-07-08: 1 mg via INTRAVENOUS
  Filled 2024-07-08: qty 1

## 2024-07-08 MED ORDER — DEXTROSE IN LACTATED RINGERS 5 % IV SOLN: INTRAVENOUS | Status: AC

## 2024-07-08 MED ORDER — LORAZEPAM 2 MG/ML IJ SOLN
1.0000 mg | Freq: Once | INTRAMUSCULAR | Status: AC
  Administered 2024-07-08: 1 mg via INTRAVENOUS
  Filled 2024-07-08: qty 1

## 2024-07-08 MED ORDER — THIAMINE MONONITRATE 100 MG PO TABS
100.0000 mg | ORAL_TABLET | Freq: Every day | ORAL | Status: DC
  Administered 2024-07-14 – 2024-07-15 (×2): 100 mg via ORAL
  Filled 2024-07-08 (×3): qty 1

## 2024-07-08 NOTE — ED Notes (Signed)
 Pt very restless in bed, pulling cardiac monitors off and agitated. Provider made aware

## 2024-07-08 NOTE — TOC CM/SW Note (Signed)
 TOC consult received for substance abuse. SW to follow up with patient as appropriate.  Merilee Batty, MSN, RN Case Management [phone]

## 2024-07-08 NOTE — ED Notes (Signed)
 Pt HR in the 120s. ESP made aware. EKG captured

## 2024-07-08 NOTE — ED Triage Notes (Signed)
 Pt bib ems from home with NV epigastric pain X1 week ongoing. Seen recently for same. Given 100cc NS and 4mg  zofran  en route. Pt vomiting on arrival.  CBG 434, denies DM history.  ST 106 158/88 99% RA 97.74F

## 2024-07-08 NOTE — ED Provider Notes (Cosign Needed Addendum)
 [place] EMERGENCY DEPARTMENT AT [facility] Provider Note   CSN: [id number] Arrival date & time: 07/08/24  1446     Patient presents with: Hyperglycemia   Ryan Bartlett is a 28 y.o. male.   28 year old male with no past medical history presents to the ED via EMS with a chief complaint of epigastric pain for about a week.  Recently seen for the same and diagnosed with a viral illness.  Reports no underlying history of diabetes however CBG by EMS was 434.  He reports increase in thirst, has not been able to eat or drink anything in approximately 6 days.  Dors is some vague chest pain as well.  Was given 100 cc of normal saline along with 4 mg of Zofran  to help with vomiting with some improvement.  Has never had any previous episodes like these.  The history is provided by the patient.  Hyperglycemia Blood sugar level PTA:  434 Severity:  Moderate Onset quality:  Sudden Duration:  1 week Timing:  Constant Progression:  Worsening Chronicity:  New Diabetes status:  Non-diabetic Associated symptoms: abdominal pain, chest pain, increased thirst, nausea, vomiting and weakness   Associated symptoms: no fever and no shortness of breath        Prior to Admission medications   Medication Sig Start Date End Date Taking? Authorizing Provider  OXcarbazepine  (TRILEPTAL ) 150 MG tablet Take 1 tablet (150 mg total) by mouth 2 (two) times daily. 10/19/20  Yes Money, Caron NOVAK, FNP  acetaminophen  (TYLENOL ) 500 MG tablet Take 1 tablet (500 mg total) by mouth every 6 (six) hours as needed. 07/05/24   Nivia Colon, PA-C  ARIPiprazole  (ABILIFY ) 10 MG tablet Take 1 tablet (10 mg total) by mouth daily. 10/20/20   Money, Caron NOVAK, FNP  benzonatate  (TESSALON ) 100 MG capsule Take 1 capsule (100 mg total) by mouth every 8 (eight) hours. 07/05/24   Nivia Colon, PA-C  busPIRone  (BUSPAR ) 15 MG tablet Take 1 tablet (15 mg total) by mouth 3 (three) times daily. 10/19/20   Money, Caron NOVAK, FNP   hydrOXYzine  (ATARAX /VISTARIL ) 50 MG tablet Take 1 tablet (50 mg total) by mouth 3 (three) times daily as needed for anxiety. 10/19/20   Money, Caron NOVAK, FNP  lidocaine  (XYLOCAINE ) 2 % solution Use as directed 15 mLs in the mouth or throat as needed for mouth pain. 07/05/24   Nivia Colon, PA-C  albuterol  (PROVENTIL  HFA;VENTOLIN  HFA) 108 (90 Base) MCG/ACT inhaler Inhale 1-2 puffs into the lungs every 4 (four) hours as needed for wheezing or shortness of breath. 04/11/18 03/11/20  Collene Gouge I, NP  divalproex  (DEPAKOTE ) 500 MG DR tablet Take 1 tablet (500 mg total) by mouth every 12 (twelve) hours. For mood stabilization Patient not taking: Reported on 10/03/2019 04/11/18 03/11/20  Collene Gouge I, NP  omeprazole  (PRILOSEC) 20 MG capsule Take 1 capsule (20 mg total) by mouth daily. Patient not taking: Reported on 10/03/2019 12/14/18 03/11/20  Carita Senior, MD    Allergies: Peanut-containing drug products and Lactose intolerance (gi)    Review of Systems  Constitutional:  Negative for fever.  Respiratory:  Negative for shortness of breath.   Cardiovascular:  Positive for chest pain.  Gastrointestinal:  Positive for abdominal pain, nausea and vomiting.  Endocrine: Positive for polydipsia and polyphagia.  Neurological:  Positive for weakness. Negative for light-headedness and headaches.  All other systems reviewed and are negative.   Updated Vital Signs BP (!) 145/109   Pulse (!) 123   Temp  98.7 F (37.1 C) (Oral)   Resp 10   SpO2 98%   Physical Exam Vitals and nursing note reviewed.  Constitutional:      Appearance: Normal appearance. He is diaphoretic.     Comments: Diaphoretic  HENT:     Head: Normocephalic and atraumatic.     Nose: Nose normal.     Mouth/Throat:     Mouth: Mucous membranes are dry.     Comments: Oropharynx appears dry. Eyes:     Pupils: Pupils are equal, round, and reactive to light.  Cardiovascular:     Rate and Rhythm: Tachycardia present.     Pulses:           Radial pulses are 2+ on the right side and 2+ on the left side.       Posterior tibial pulses are 2+ on the right side and 2+ on the left side.  Pulmonary:     Effort: Pulmonary effort is normal.     Breath sounds: No wheezing.  Abdominal:     General: Abdomen is flat.     Palpations: Abdomen is soft.     Tenderness: There is abdominal tenderness.     Comments: Mild tenderness to palpation on the epigastric region.  Musculoskeletal:     Cervical back: Normal range of motion and neck supple.  Neurological:     Mental Status: He is alert and oriented to person, place, and time.     (all labs ordered are listed, but only abnormal results are displayed) Labs Reviewed  BASIC METABOLIC PANEL WITH GFR - Abnormal; Notable for the following components:      Result Value   Sodium 111 (*)    Potassium 6.4 (*)    Chloride 73 (*)    CO2 16 (*)    Glucose, Bld 818 (*)    BUN 56 (*)    Creatinine, Ser 1.77 (*)    GFR, Estimated 52 (*)    Anion gap 22 (*)    All other components within normal limits  CBC WITH DIFFERENTIAL/PLATELET - Abnormal; Notable for the following components:   WBC 16.4 (*)    RBC 6.29 (*)    Hemoglobin 17.9 (*)    MCV 77.4 (*)    MCHC 36.8 (*)    Neutro Abs 12.9 (*)    Monocytes Absolute 1.7 (*)    All other components within normal limits  URINALYSIS, ROUTINE W REFLEX MICROSCOPIC - Abnormal; Notable for the following components:   Color, Urine STRAW (*)    Glucose, UA >=500 (*)    Hgb urine dipstick MODERATE (*)    Ketones, ur 5 (*)    All other components within normal limits  BETA-HYDROXYBUTYRIC ACID - Abnormal; Notable for the following components:   Beta-Hydroxybutyric Acid 3.81 (*)    All other components within normal limits  CBG MONITORING, ED - Abnormal; Notable for the following components:   Glucose-Capillary >600 (*)    All other components within normal limits  I-STAT CHEM 8, ED - Abnormal; Notable for the following components:   Sodium 111  (*)    Potassium 6.4 (*)    Chloride 81 (*)    BUN 57 (*)    Creatinine, Ser 2.10 (*)    Glucose, Bld >700 (*)    Calcium , Ion 1.00 (*)    Hemoglobin 19.4 (*)    HCT 57.0 (*)    All other components within normal limits  I-STAT VENOUS BLOOD GAS, ED - Abnormal; Notable  for the following components:   Acid-base deficit 3.0 (*)    Sodium 108 (*)    Potassium 6.4 (*)    Calcium , Ion 0.97 (*)    HCT 54.0 (*)    Hemoglobin 18.4 (*)    All other components within normal limits  CBG MONITORING, ED - Abnormal; Notable for the following components:   Glucose-Capillary >600 (*)    All other components within normal limits  CBG MONITORING, ED - Abnormal; Notable for the following components:   Glucose-Capillary >600 (*)    All other components within normal limits  CBG MONITORING, ED - Abnormal; Notable for the following components:   Glucose-Capillary >600 (*)    All other components within normal limits  CBG MONITORING, ED - Abnormal; Notable for the following components:   Glucose-Capillary >600 (*)    All other components within normal limits  CBG MONITORING, ED - Abnormal; Notable for the following components:   Glucose-Capillary >600 (*)    All other components within normal limits  I-STAT CHEM 8, ED - Abnormal; Notable for the following components:   Sodium 121 (*)    Chloride 87 (*)    BUN 54 (*)    Creatinine, Ser 1.80 (*)    Glucose, Bld 482 (*)    Calcium , Ion 1.07 (*)    Hemoglobin 18.7 (*)    HCT 55.0 (*)    All other components within normal limits  CBG MONITORING, ED - Abnormal; Notable for the following components:   Glucose-Capillary >600 (*)    All other components within normal limits  CBG MONITORING, ED - Abnormal; Notable for the following components:   Glucose-Capillary >600 (*)    All other components within normal limits  CBG MONITORING, ED - Abnormal; Notable for the following components:   Glucose-Capillary 493 (*)    All other components within normal  limits  CBG MONITORING, ED - Abnormal; Notable for the following components:   Glucose-Capillary 437 (*)    All other components within normal limits  ETHANOL  RAPID URINE DRUG SCREEN, HOSP PERFORMED    EKG: EKG Interpretation Date/Time:  Wednesday July 08 2024 16:41:07 EST Ventricular Rate:  100 PR Interval:  141 QRS Duration:  87 QT Interval:  349 QTC Calculation: 451 R Axis:   35  Text Interpretation: Sinus tachycardia Nonspecific ST abnormalities Compared wtih prior EKG frm 10/19/2020 Confirmed by Gennaro Bouchard ([id number]) on 07/08/2024 6:44:24 PM  Radiology: CT ABDOMEN PELVIS WO CONTRAST Result Date: 07/08/2024 EXAM: CT ABDOMEN AND PELVIS WITHOUT CONTRAST 07/08/2024 06:45:00 PM TECHNIQUE: CT of the abdomen and pelvis was performed without the administration of intravenous contrast. Multiplanar reformatted images are provided for review. Automated exposure control, iterative reconstruction, and/or weight-based adjustment of the mA/kV was utilized to reduce the radiation dose to as low as reasonably achievable. COMPARISON: None available. CLINICAL HISTORY: Abdominal pain, acute, nonlocalized. FINDINGS: LOWER CHEST: No acute abnormality. LIVER: The liver is unremarkable. GALLBLADDER AND BILE DUCTS: Gallbladder is unremarkable. No biliary ductal dilatation. SPLEEN: No acute abnormality. PANCREAS: No acute abnormality. ADRENAL GLANDS: No acute abnormality. KIDNEYS, URETERS AND BLADDER: No stones in the kidneys or ureters. No hydronephrosis. No perinephric or periureteral stranding. Urinary bladder is unremarkable. GI AND BOWEL: Fluid is seen within the mid esophagus which may reflect changes of esophageal dysmotility or gastroesophageal reflux. Appendix normal. The stomach, small bowel, and large bowel are otherwise unremarkable. There is no bowel obstruction. PERITONEUM AND RETROPERITONEUM: No ascites. No free air. VASCULATURE: Aorta is normal in caliber.  LYMPH NODES: No lymphadenopathy.  REPRODUCTIVE ORGANS: No acute abnormality. BONES AND SOFT TISSUES: Possible injection granuloma within the subcutaneous soft tissues of the gluteal regions bilaterally. No acute osseous abnormality. IMPRESSION: 1. No acute findings in the abdomen or pelvis 2. Fluid within the mid esophagus, possibly related to esophageal dysmotility or gastroesophageal reflux Electronically signed by: Dorethia Molt MD 07/08/2024 07:32 PM EST RP Workstation: [id number]     .Critical Care  Performed by: Staphanie Harbison, PA-C Authorized by: Laquanna Veazey, PA-C   Critical care provider statement:    Critical care time (minutes):  60   Critical care start time:  07/08/2024 5:00 PM   Critical care end time:  07/08/2024 6:00 PM   Critical care was necessary to treat or prevent imminent or life-threatening deterioration of the following conditions:  Metabolic crisis and endocrine crisis   Critical care was time spent personally by me on the following activities:  Development of treatment plan with patient or surrogate, discussions with consultants, evaluation of patient's response to treatment, examination of patient, ordering and review of laboratory studies, ordering and review of radiographic studies, ordering and performing treatments and interventions, pulse oximetry, re-evaluation of patient's condition and review of old charts   Care discussed with: admitting provider      Medications Ordered in the ED  insulin regular, human (MYXREDLIN) 100 units/ 100 mL infusion (7.5 Units/hr Intravenous Rate/Dose Change 07/08/24 2057)  lactated ringers infusion ( Intravenous New Bag/Given 07/08/24 1544)  dextrose 5 % in lactated ringers infusion (has no administration in time range)  dextrose 50 % solution 0-50 mL (has no administration in time range)  ondansetron  (ZOFRAN ) injection 4 mg (4 mg Intravenous Given 07/08/24 2056)  LORazepam  (ATIVAN ) injection 1 mg (1 mg Intravenous Given 07/08/24 1652)  dicyclomine (BENTYL)  injection 10 mg (10 mg Intramuscular Given 07/08/24 1856)  LORazepam  (ATIVAN ) injection 1 mg (1 mg Intravenous Given 07/08/24 2056)    Clinical Course as of 07/08/24 2205  Wed Jul 08, 2024  1645 WBC(!): 16.4 [JS]  1645 Glucose-Capillary(!!): >600 [JS]  1645 Potassium(!!): 6.4 [JS]  1645 Beta-Hydroxybutyric Acid(!): 3.81 [JS]  1822 Sodium(!!): 111 [JS]  2103 Glucose-Capillary(!): 437 [JS]  2103 Anion gap(!): 22 [JS]    Clinical Course User Index [JS] Lakshya Mcgillicuddy, PA-C                                 Medical Decision Making Amount and/or Complexity of Data Reviewed Labs: ordered. Decision-making details documented in ED Course. Radiology: ordered.  Risk Prescription drug management. Decision regarding hospitalization.   This patient presents to the ED for concern of hyperglycemia, this involves a number of treatment options, and is a complaint that carries with it a high risk of complications and morbidity.  The differential diagnosis includes new onset diabetes, infection, DKA versus HHS.   Co morbidities: Discussed in HPI   Brief History:  See HPI  EMR reviewed including pt PMHx, past surgical history and past visits to ER.   See HPI for more details   Lab Tests:  I ordered and independently interpreted labs.  The pertinent results include:    CBC with a leukocytosis of 16,000, hemoglobin is elevated he is hemoconcentrated.  No obvious source of infection noted.  BMP with a sodium of 111, potassium of 6.4 and glucose greater than 818.  Creatinine is 1.77, no underlying history of kidney disease.  UA is noninfectious, no ketones present.  VBG that showed a pH of 7.3.   Imaging Studies:  CT abdomen obtained due to underlying distention: IMPRESSION:  1. No acute findings in the abdomen or pelvis  2. Fluid within the mid esophagus, possibly related to esophageal dysmotility  or gastroesophageal reflux   Cardiac Monitoring:  The patient was maintained on a  cardiac monitor.  I personally viewed and interpreted the cardiac monitored which showed an underlying rhythm of: Sinus tachycardia EKG non-ischemic   Medicines ordered:  I ordered medication including Ativan  for nausea and vomiting Reevaluation of the patient after these medicines showed that the patient improved I have reviewed the patients home medicines and have made adjustments as needed   Critical Interventions:  Patient started on a glucose stabilizer requiring 9.5 units of insulin to help with blood sugar in the 800s.   Reevaluation:  After the interventions noted above I re-evaluated patient and found that they have :improved   Social Determinants of Health:  The patient's social determinants of health were a factor in the care of this patient   Problem List / ED Course:  Patient presented to the ED via EMS with a chief complaint of Foley aphasia, generalized abdominal pain with focal point of epigastric pain, nausea and vomiting which has been ongoing for the past 6 days.  Reports last oral intake was about 6 days ago, has not been able to keep anything down.  Evaluated in the emergency department 3 days ago where he was diagnosed with viral illness and flulike symptoms.  Here his CBG was 400s however upon recheck with i-STAT Chem-8 his glucose is greater than 700 creatinine is 2 with no underlying past medical history of diabetes or renal failure.  He was started on a glucose stabilizer, VBG stable with a pH of 7.3.  Does have an anion gap of 22.  White count of 16,000 but no source of obvious infection. Glucose continues to trend downward, potassium is elevated with a potassium of 6.4 remarkable for hyperkalemia EKG obtained without any EKG changes.  Suspect that glucose will help bring this level down.  Sodium is dangerously low at 111, however patient is able to answer full questions, is uncooperative of his work upright at this time but does follow commands and is aware  where he is.  He actually texted his grandfather who I spoke to hand let them know that he was in the hospital. I did also speak to his mother on the phone who reports patient was on multiple mental health medications, however we do not have any record of this.  I have placed a pharmacy medication reconciliation in order to further look into these medications.  His grandfather does tell me that there was paraphernalia along with some alcohol at his residence, therefore ethanol level along with UDS have been added. His mother also voices concern for distention in his abdomen therefore CT abdomen and pelvis without contrast was ordered to further evaluate.  Patient was given Ativan  to help with nausea, no other history on his chart.  CT evaluation noted for. Spoke to Critical care team who recommended that hospitalist admission after sodium correction. Spoke to Dr. Alfornia hospitalist who will admit patient for further management.   Dispostion:  After consideration of the diagnostic results and the patients response to treatment, I feel that the patent would benefit from admission for further management.    Portions of this note were generated with Scientist, clinical (histocompatibility and immunogenetics). Dictation errors may occur despite best attempts  at proofreading.   Final diagnoses:  Hyperglycemia  AKI (acute kidney injury)  Hyponatremia  Hyperkalemia    ED Discharge Orders     None          Maureen Broad, PA-C 07/08/24 2107    Alicia Seib, PA-C 07/08/24 2205    Rogelia Jerilynn RAMAN, MD 07/09/24 [phone]

## 2024-07-08 NOTE — ED Notes (Signed)
 Johana, PA, aware of sodium, potassium and glucose results.

## 2024-07-09 DIAGNOSIS — Z781 Physical restraint status: Secondary | ICD-10-CM | POA: Diagnosis not present

## 2024-07-09 DIAGNOSIS — R4701 Aphasia: Secondary | ICD-10-CM | POA: Diagnosis present

## 2024-07-09 DIAGNOSIS — E1110 Type 2 diabetes mellitus with ketoacidosis without coma: Secondary | ICD-10-CM | POA: Diagnosis present

## 2024-07-09 DIAGNOSIS — E739 Lactose intolerance, unspecified: Secondary | ICD-10-CM | POA: Diagnosis present

## 2024-07-09 DIAGNOSIS — E875 Hyperkalemia: Secondary | ICD-10-CM | POA: Diagnosis present

## 2024-07-09 DIAGNOSIS — F17210 Nicotine dependence, cigarettes, uncomplicated: Secondary | ICD-10-CM | POA: Diagnosis present

## 2024-07-09 DIAGNOSIS — E66811 Obesity, class 1: Secondary | ICD-10-CM | POA: Diagnosis present

## 2024-07-09 DIAGNOSIS — Z1152 Encounter for screening for COVID-19: Secondary | ICD-10-CM | POA: Diagnosis not present

## 2024-07-09 DIAGNOSIS — F909 Attention-deficit hyperactivity disorder, unspecified type: Secondary | ICD-10-CM | POA: Diagnosis present

## 2024-07-09 DIAGNOSIS — F10239 Alcohol dependence with withdrawal, unspecified: Secondary | ICD-10-CM | POA: Diagnosis not present

## 2024-07-09 DIAGNOSIS — E860 Dehydration: Secondary | ICD-10-CM | POA: Diagnosis present

## 2024-07-09 DIAGNOSIS — N179 Acute kidney failure, unspecified: Secondary | ICD-10-CM | POA: Diagnosis present

## 2024-07-09 DIAGNOSIS — Z6832 Body mass index (BMI) 32.0-32.9, adult: Secondary | ICD-10-CM | POA: Diagnosis not present

## 2024-07-09 DIAGNOSIS — E871 Hypo-osmolality and hyponatremia: Secondary | ICD-10-CM

## 2024-07-09 DIAGNOSIS — J45909 Unspecified asthma, uncomplicated: Secondary | ICD-10-CM | POA: Diagnosis present

## 2024-07-09 DIAGNOSIS — F4310 Post-traumatic stress disorder, unspecified: Secondary | ICD-10-CM | POA: Diagnosis present

## 2024-07-09 DIAGNOSIS — R1110 Vomiting, unspecified: Secondary | ICD-10-CM

## 2024-07-09 DIAGNOSIS — Y900 Blood alcohol level of less than 20 mg/100 ml: Secondary | ICD-10-CM | POA: Diagnosis present

## 2024-07-09 DIAGNOSIS — F1513 Other stimulant abuse with withdrawal: Secondary | ICD-10-CM | POA: Diagnosis not present

## 2024-07-09 DIAGNOSIS — G929 Unspecified toxic encephalopathy: Secondary | ICD-10-CM | POA: Diagnosis present

## 2024-07-09 DIAGNOSIS — F250 Schizoaffective disorder, bipolar type: Secondary | ICD-10-CM | POA: Diagnosis not present

## 2024-07-09 DIAGNOSIS — F259 Schizoaffective disorder, unspecified: Secondary | ICD-10-CM | POA: Diagnosis present

## 2024-07-09 DIAGNOSIS — G928 Other toxic encephalopathy: Secondary | ICD-10-CM | POA: Diagnosis not present

## 2024-07-09 DIAGNOSIS — R410 Disorientation, unspecified: Secondary | ICD-10-CM | POA: Diagnosis not present

## 2024-07-09 DIAGNOSIS — F913 Oppositional defiant disorder: Secondary | ICD-10-CM | POA: Diagnosis present

## 2024-07-09 DIAGNOSIS — F1523 Other stimulant dependence with withdrawal: Secondary | ICD-10-CM | POA: Diagnosis not present

## 2024-07-09 DIAGNOSIS — Z7984 Long term (current) use of oral hypoglycemic drugs: Secondary | ICD-10-CM | POA: Diagnosis not present

## 2024-07-09 DIAGNOSIS — F319 Bipolar disorder, unspecified: Secondary | ICD-10-CM | POA: Diagnosis present

## 2024-07-09 DIAGNOSIS — I10 Essential (primary) hypertension: Secondary | ICD-10-CM | POA: Diagnosis present

## 2024-07-09 DIAGNOSIS — E119 Type 2 diabetes mellitus without complications: Secondary | ICD-10-CM | POA: Diagnosis not present

## 2024-07-09 DIAGNOSIS — G4733 Obstructive sleep apnea (adult) (pediatric): Secondary | ICD-10-CM | POA: Diagnosis present

## 2024-07-09 LAB — GLUCOSE, CAPILLARY
Glucose-Capillary: 147 mg/dL — ABNORMAL HIGH (ref 70–99)
Glucose-Capillary: 254 mg/dL — ABNORMAL HIGH (ref 70–99)
Glucose-Capillary: 255 mg/dL — ABNORMAL HIGH (ref 70–99)
Glucose-Capillary: 262 mg/dL — ABNORMAL HIGH (ref 70–99)
Glucose-Capillary: 262 mg/dL — ABNORMAL HIGH (ref 70–99)
Glucose-Capillary: 348 mg/dL — ABNORMAL HIGH (ref 70–99)

## 2024-07-09 LAB — RAPID URINE DRUG SCREEN, HOSP PERFORMED
Amphetamines: POSITIVE — AB
Barbiturates: NOT DETECTED
Benzodiazepines: POSITIVE — AB
Cocaine: NOT DETECTED
Opiates: NOT DETECTED
Tetrahydrocannabinol: NOT DETECTED

## 2024-07-09 LAB — CBG MONITORING, ED
Glucose-Capillary: 342 mg/dL — ABNORMAL HIGH (ref 70–99)
Glucose-Capillary: 215 mg/dL — ABNORMAL HIGH (ref 70–99)
Glucose-Capillary: 225 mg/dL — ABNORMAL HIGH (ref 70–99)
Glucose-Capillary: 259 mg/dL — ABNORMAL HIGH (ref 70–99)
Glucose-Capillary: 256 mg/dL — ABNORMAL HIGH (ref 70–99)
Glucose-Capillary: 216 mg/dL — ABNORMAL HIGH (ref 70–99)
Glucose-Capillary: 204 mg/dL — ABNORMAL HIGH (ref 70–99)
Glucose-Capillary: 284 mg/dL — ABNORMAL HIGH (ref 70–99)
Glucose-Capillary: 253 mg/dL — ABNORMAL HIGH (ref 70–99)
Glucose-Capillary: 272 mg/dL — ABNORMAL HIGH (ref 70–99)
Glucose-Capillary: 291 mg/dL — ABNORMAL HIGH (ref 70–99)
Glucose-Capillary: 334 mg/dL — ABNORMAL HIGH (ref 70–99)
Glucose-Capillary: 340 mg/dL — ABNORMAL HIGH (ref 70–99)

## 2024-07-09 LAB — BASIC METABOLIC PANEL WITH GFR
Anion gap: 17 — ABNORMAL HIGH (ref 5–15)
Anion gap: 13 (ref 5–15)
Anion gap: 17 — ABNORMAL HIGH (ref 5–15)
BUN: 47 mg/dL — ABNORMAL HIGH (ref 6–20)
BUN: 40 mg/dL — ABNORMAL HIGH (ref 6–20)
BUN: 37 mg/dL — ABNORMAL HIGH (ref 6–20)
CO2: 23 mmol/L (ref 22–32)
CO2: 24 mmol/L (ref 22–32)
CO2: 19 mmol/L — ABNORMAL LOW (ref 22–32)
Calcium: 8.9 mg/dL (ref 8.9–10.3)
Calcium: 8.5 mg/dL — ABNORMAL LOW (ref 8.9–10.3)
Calcium: 8.4 mg/dL — ABNORMAL LOW (ref 8.9–10.3)
Chloride: 86 mmol/L — ABNORMAL LOW (ref 98–111)
Chloride: 85 mmol/L — ABNORMAL LOW (ref 98–111)
Chloride: 88 mmol/L — ABNORMAL LOW (ref 98–111)
Creatinine, Ser: 1.52 mg/dL — ABNORMAL HIGH (ref 0.61–1.24)
Creatinine, Ser: 1.33 mg/dL — ABNORMAL HIGH (ref 0.61–1.24)
Creatinine, Ser: 1.43 mg/dL — ABNORMAL HIGH (ref 0.61–1.24)
GFR, Estimated: >60 mL/min (ref >60)
GFR, Estimated: >60 mL/min (ref >60)
GFR, Estimated: >60 mL/min (ref >60)
Glucose, Bld: 208 mg/dL — ABNORMAL HIGH (ref 70–99)
Glucose, Bld: 252 mg/dL — ABNORMAL HIGH (ref 70–99)
Glucose, Bld: 277 mg/dL — ABNORMAL HIGH (ref 70–99)
Potassium: 3.8 mmol/L (ref 3.5–5.1)
Potassium: 3.5 mmol/L (ref 3.5–5.1)
Potassium: 4.6 mmol/L (ref 3.5–5.1)
Sodium: 126 mmol/L — ABNORMAL LOW (ref 135–145)
Sodium: 122 mmol/L — ABNORMAL LOW (ref 135–145)
Sodium: 124 mmol/L — ABNORMAL LOW (ref 135–145)

## 2024-07-09 LAB — CBC
HCT: 47 % (ref 39.0–52.0)
Hemoglobin: 17.3 g/dL — ABNORMAL HIGH (ref 13.0–17.0)
MCH: 28.4 pg (ref 26.0–34.0)
MCHC: 36.8 g/dL — ABNORMAL HIGH (ref 30.0–36.0)
MCV: 77 fL — ABNORMAL LOW (ref 80.0–100.0)
Platelets: 311 K/uL (ref 150–400)
RBC: 6.1 MIL/uL — ABNORMAL HIGH (ref 4.22–5.81)
RDW: 12.6 % (ref 11.5–15.5)
WBC: 14.9 K/uL — ABNORMAL HIGH (ref 4.0–10.5)
nRBC: 0 % (ref 0.0–0.2)

## 2024-07-09 LAB — HIV ANTIBODY (ROUTINE TESTING W REFLEX): HIV Screen 4th Generation wRfx: NONREACTIVE

## 2024-07-09 LAB — OSMOLALITY, URINE: Osmolality, Ur: 801 mosm/kg (ref 300–900)

## 2024-07-09 LAB — HEMOGLOBIN A1C
Hgb A1c MFr Bld: 12.1 % — ABNORMAL HIGH (ref 4.8–5.6)
Mean Plasma Glucose: 300.57 mg/dL

## 2024-07-09 LAB — OSMOLALITY: Osmolality: 291 mosm/kg (ref 275–295)

## 2024-07-09 LAB — SODIUM, URINE, RANDOM: Sodium, Ur: 31 mmol/L

## 2024-07-09 MED ORDER — DEXTROSE IN LACTATED RINGERS 5 % IV SOLN: INTRAVENOUS | Status: DC

## 2024-07-09 MED ORDER — POTASSIUM CHLORIDE 10 MEQ/100ML IV SOLN
10.0000 meq | INTRAVENOUS | Status: AC
  Administered 2024-07-09 (×4): 10 meq via INTRAVENOUS
  Filled 2024-07-09 (×4): qty 100

## 2024-07-09 MED ORDER — HYDRALAZINE HCL 20 MG/ML IJ SOLN
10.0000 mg | Freq: Four times a day (QID) | INTRAMUSCULAR | Status: DC | PRN
  Filled 2024-07-09: qty 1

## 2024-07-09 MED ORDER — LACTATED RINGERS IV SOLN: INTRAVENOUS | Status: DC

## 2024-07-09 MED ORDER — HALOPERIDOL LACTATE 5 MG/ML IJ SOLN
2.0000 mg | Freq: Once | INTRAMUSCULAR | Status: AC
  Administered 2024-07-09: 2 mg via INTRAVENOUS
  Filled 2024-07-09: qty 1

## 2024-07-09 MED ORDER — PANTOPRAZOLE SODIUM 40 MG PO TBEC
40.0000 mg | DELAYED_RELEASE_TABLET | Freq: Every day | ORAL | Status: DC
  Filled 2024-07-09: qty 1

## 2024-07-09 MED ORDER — INSULIN REGULAR(HUMAN) IN NACL 100-0.9 UT/100ML-% IV SOLN
INTRAVENOUS | Status: AC
  Administered 2024-07-09: 13 [IU]/h via INTRAVENOUS
  Administered 2024-07-09: 14 [IU]/h via INTRAVENOUS
  Administered 2024-07-10: 6 [IU]/h via INTRAVENOUS
  Filled 2024-07-09 (×3): qty 100

## 2024-07-09 MED ORDER — HEPARIN SODIUM (PORCINE) 5000 UNIT/ML IJ SOLN
5000.0000 [IU] | Freq: Three times a day (TID) | INTRAMUSCULAR | Status: DC
  Administered 2024-07-09 – 2024-07-13 (×13): 5000 [IU] via SUBCUTANEOUS
  Filled 2024-07-09 (×13): qty 1

## 2024-07-09 NOTE — H&P (Signed)
 History and Physical    Ryan Bartlett FMW:[id number] DOB: 24-Aug-1995 DOA: 07/08/2024  PCP: Patient, No Pcp Per  Patient coming from: Home  Chief Complaint: Nausea and vomiting  HPI: Ryan Bartlett is a 28 y.o. male with medical history significant of ADHD, bipolar disorder, depression, oppositional defiant disorder, PTSD, schizoaffective disorder, marijuana and tobacco abuse, arthritis, asthma.  Patient was seen at [facility] ED 3 days ago for URI symptoms and one episode of nonbloody nonbilious emesis.  He tested negative for COVID/flu/RSV and was discharged.  Patient presents to [facility] ED today via EMS with complaints of nausea, vomiting, and epigastric pain.  He was given 100 cc normal saline and 4 mg of Zofran  by EMS.  Patient vomited on arrival.  He was tachycardic and tachypneic on arrival but afebrile and not hypotensive or hypoxic.  Labs notable for WBC count 16.4, hemoglobin 17.9, MCV 77.4, sodium 111, potassium 6.4, chloride 73, glucose 818, bicarb 16, anion gap 22, BUN 56, creatinine 1.77, beta hydroxybutyric acid 3.81.  UA with >500 glucose, 5 ketones, and not suggestive of infection.  Ethanol level <15.  UDS pending.  CT abdomen pelvis showing no acute findings.  Showing fluid within the midesophagus, possibly related to esophageal dysmotility or gastroesophageal reflux.  EKG showing sinus tachycardia, T waves appear peaked in comparison to previous EKGs.  Patient was given Bentyl , Ativan , and started on insulin  drip and IV fluids.  Hyponatremia and hyperkalemia improved on repeat i-STAT chemistry.  EDP had discussed the case with critical care and they felt that the patient did not require ICU admission.  TRH called to admit.  Patient is currently awake and alert.  History limited as he did not appear interested in engaging in conversation.  Repeatedly asking for food and water .  Denies history of diabetes.  Reports having nausea and vomiting for the past 3 days.  Denies  abdominal pain.  Denies cough, shortness of breath, or chest pain.  Denies alcohol use.  Review of Systems:  Review of Systems  All other systems reviewed and are negative.   Past Medical History:  Diagnosis Date   ADHD (attention deficit hyperactivity disorder)    Arthritis    Asthma    Bipolar disorder (HCC)    Depression    Oppositional defiant disorder    PTSD (post-traumatic stress disorder)    Schizoaffective disorder (HCC)    Unspecified episodic mood disorder     Past Surgical History:  Procedure Laterality Date   NO PAST SURGERIES       reports that he has been smoking cigarettes. He has never used smokeless tobacco. He reports current alcohol use. He reports that he does not currently use drugs after having used the following drugs: Marijuana.  Allergies  Allergen Reactions   Peanut-Containing Drug Products Anaphylaxis   Lactose Intolerance (Gi) Diarrhea and Nausea And Vomiting    Family History  Problem Relation Age of Onset   Depression Mother    Diabetes Other    Hyperlipidemia Other    Hypertension Other     Prior to Admission medications   Medication Sig Start Date End Date Taking? Authorizing Provider  OXcarbazepine  (TRILEPTAL ) 150 MG tablet Take 1 tablet (150 mg total) by mouth 2 (two) times daily. 10/19/20  Yes Money, Caron NOVAK, FNP  acetaminophen  (TYLENOL ) 500 MG tablet Take 1 tablet (500 mg total) by mouth every 6 (six) hours as needed. 07/05/24   Nivia Colon, PA-C  ARIPiprazole  (ABILIFY ) 10 MG tablet Take  1 tablet (10 mg total) by mouth daily. 10/20/20   Money, Caron NOVAK, FNP  benzonatate  (TESSALON ) 100 MG capsule Take 1 capsule (100 mg total) by mouth every 8 (eight) hours. 07/05/24   Nivia Colon, PA-C  busPIRone  (BUSPAR ) 15 MG tablet Take 1 tablet (15 mg total) by mouth 3 (three) times daily. 10/19/20   Money, Caron NOVAK, FNP  hydrOXYzine  (ATARAX /VISTARIL ) 50 MG tablet Take 1 tablet (50 mg total) by mouth 3 (three) times daily as needed for anxiety. 10/19/20    Money, Caron NOVAK, FNP  lidocaine  (XYLOCAINE ) 2 % solution Use as directed 15 mLs in the mouth or throat as needed for mouth pain. 07/05/24   Nivia Colon, PA-C  albuterol  (PROVENTIL  HFA;VENTOLIN  HFA) 108 (90 Base) MCG/ACT inhaler Inhale 1-2 puffs into the lungs every 4 (four) hours as needed for wheezing or shortness of breath. 04/11/18 03/11/20  Collene Gouge I, NP  divalproex  (DEPAKOTE ) 500 MG DR tablet Take 1 tablet (500 mg total) by mouth every 12 (twelve) hours. For mood stabilization Patient not taking: Reported on 10/03/2019 04/11/18 03/11/20  Collene Gouge I, NP  omeprazole  (PRILOSEC) 20 MG capsule Take 1 capsule (20 mg total) by mouth daily. Patient not taking: Reported on 10/03/2019 12/14/18 03/11/20  Carita Senior, MD    Physical Exam: Vitals:   07/08/24 2230 07/08/24 2330 07/08/24 2340 07/08/24 2343  BP:  (!) 165/113    Pulse: (!) 122 (!) 116  (!) 115  Resp: (!) 27 15    Temp:   98.9 F (37.2 C)   TempSrc:   Oral   SpO2: 96% 95%      Physical Exam Vitals reviewed.  Constitutional:      General: He is not in acute distress. HENT:     Head: Normocephalic and atraumatic.     Mouth/Throat:     Mouth: Mucous membranes are dry.  Eyes:     Comments: Unable to examine as patient will not open his eyes  Cardiovascular:     Rate and Rhythm: Regular rhythm. Tachycardia present.  Pulmonary:     Effort: Pulmonary effort is normal. No respiratory distress.     Breath sounds: Normal breath sounds.  Abdominal:     General: Bowel sounds are normal.     Palpations: Abdomen is soft.     Tenderness: There is no abdominal tenderness. There is no guarding.  Musculoskeletal:     Cervical back: Normal range of motion.     Right lower leg: No edema.     Left lower leg: No edema.  Skin:    General: Skin is warm and dry.  Neurological:     General: No focal deficit present.     Mental Status: He is alert and oriented to person, place, and time.     Labs on Admission: I have personally  reviewed following labs and imaging studies  CBC: Recent Labs  Lab 07/08/24 1515 07/08/24 1526 07/08/24 2013  WBC 16.4*  --   --   NEUTROABS 12.9*  --   --   HGB 17.9* 19.4*  18.4* 18.7*  HCT 48.7 57.0*  54.0* 55.0*  MCV 77.4*  --   --   PLT 336  --   --    Basic Metabolic Panel: Recent Labs  Lab 07/08/24 1515 07/08/24 1526 07/08/24 2013  NA 111* 111*  108* 121*  K 6.4* 6.4*  6.4* 4.6  CL 73* 81* 87*  CO2 16*  --   --   GLUCOSE  818* >700* 482*  BUN 56* 57* 54*  CREATININE 1.77* 2.10* 1.80*  CALCIUM  9.0  --   --    GFR: Estimated Creatinine Clearance: 70.3 mL/min (A) (by C-G formula based on SCr of 1.8 mg/dL (H)). Liver Function Tests: No results for input(s): AST, ALT, ALKPHOS, BILITOT, PROT, ALBUMIN in the last 168 hours. No results for input(s): LIPASE, AMYLASE in the last 168 hours. No results for input(s): AMMONIA in the last 168 hours. Coagulation Profile: No results for input(s): INR, PROTIME in the last 168 hours. Cardiac Enzymes: No results for input(s): CKTOTAL, CKMB, CKMBINDEX, TROPONINI in the last 168 hours. BNP (last 3 results) No results for input(s): PROBNP in the last 8760 hours. HbA1C: No results for input(s): HGBA1C in the last 72 hours. CBG: Recent Labs  Lab 07/08/24 1910 07/08/24 2012 07/08/24 2049 07/08/24 2205 07/08/24 2337  GLUCAP >600* 493* 437* 389* 388*   Lipid Profile: No results for input(s): CHOL, HDL, LDLCALC, TRIG, CHOLHDL, LDLDIRECT in the last 72 hours. Thyroid  Function Tests: No results for input(s): TSH, T4TOTAL, FREET4, T3FREE, THYROIDAB in the last 72 hours. Anemia Panel: No results for input(s): VITAMINB12, FOLATE, FERRITIN, TIBC, IRON, RETICCTPCT in the last 72 hours. Urine analysis:    Component Value Date/Time   COLORURINE STRAW (A) 07/08/2024 1547   APPEARANCEUR CLEAR 07/08/2024 1547   LABSPEC 1.022 07/08/2024 1547   PHURINE 5.0 07/08/2024  1547   GLUCOSEU >=500 (A) 07/08/2024 1547   HGBUR MODERATE (A) 07/08/2024 1547   BILIRUBINUR NEGATIVE 07/08/2024 1547   KETONESUR 5 (A) 07/08/2024 1547   PROTEINUR NEGATIVE 07/08/2024 1547   UROBILINOGEN 2.0 (H) 01/11/2013 2121   NITRITE NEGATIVE 07/08/2024 1547   LEUKOCYTESUR NEGATIVE 07/08/2024 1547    Radiological Exams on Admission: CT ABDOMEN PELVIS WO CONTRAST Result Date: 07/08/2024 EXAM: CT ABDOMEN AND PELVIS WITHOUT CONTRAST 07/08/2024 06:45:00 PM TECHNIQUE: CT of the abdomen and pelvis was performed without the administration of intravenous contrast. Multiplanar reformatted images are provided for review. Automated exposure control, iterative reconstruction, and/or weight-based adjustment of the mA/kV was utilized to reduce the radiation dose to as low as reasonably achievable. COMPARISON: None available. CLINICAL HISTORY: Abdominal pain, acute, nonlocalized. FINDINGS: LOWER CHEST: No acute abnormality. LIVER: The liver is unremarkable. GALLBLADDER AND BILE DUCTS: Gallbladder is unremarkable. No biliary ductal dilatation. SPLEEN: No acute abnormality. PANCREAS: No acute abnormality. ADRENAL GLANDS: No acute abnormality. KIDNEYS, URETERS AND BLADDER: No stones in the kidneys or ureters. No hydronephrosis. No perinephric or periureteral stranding. Urinary bladder is unremarkable. GI AND BOWEL: Fluid is seen within the mid esophagus which may reflect changes of esophageal dysmotility or gastroesophageal reflux. Appendix normal. The stomach, small bowel, and large bowel are otherwise unremarkable. There is no bowel obstruction. PERITONEUM AND RETROPERITONEUM: No ascites. No free air. VASCULATURE: Aorta is normal in caliber. LYMPH NODES: No lymphadenopathy. REPRODUCTIVE ORGANS: No acute abnormality. BONES AND SOFT TISSUES: Possible injection granuloma within the subcutaneous soft tissues of the gluteal regions bilaterally. No acute osseous abnormality. IMPRESSION: 1. No acute findings in the  abdomen or pelvis 2. Fluid within the mid esophagus, possibly related to esophageal dysmotility or gastroesophageal reflux Electronically signed by: Dorethia Molt MD 07/08/2024 07:32 PM EST RP Workstation: [id number]    Assessment and Plan  New onset diabetes with DKA Presenting with glucose 818, bicarb 16, anion gap 22, beta hydroxybutyric acid 3.81, and VBG with pH 7.32.  Patient was started on insulin drip and IV fluids in the ED.  Glucose now improved to 380s.  Patient is no longer actively vomiting and requesting water.  Keep n.p.o. except sips of water/ice chips.  Continue insulin drip and IV fluids per DKA protocol.  Continue frequent CBG checks per Endo tool.  Monitor BMP every 4 hours.  Check hemoglobin A1c.  Nausea and vomiting Symptoms likely related to DKA.  Tested negative for COVID/flu/RSV during recent ED visit.  Mild leukocytosis on labs which could possibly be due to hemoconcentration/dehydration as hemoglobin and platelet count are also up from baseline.  Patient is afebrile.  CT abdomen pelvis showing no acute findings.  Showing fluid within the midesophagus, possibly related to esophageal dysmotility or gastroesophageal reflux.  History of marijuana abuse and UDS pending.  Patient is no longer actively vomiting now.  Continue IV fluid hydration and start Protonix  40 mg daily.  Monitor CBC.  Hyponatremia Initial metabolic panel showing sodium 888 in the setting of glucose 818.  Patient was started on insulin drip and IV fluids in the ED.  Repeat i-STAT chemistry showing improvement of glucose to 482 and sodium 121.  ED PA discussed the case with critical care and they felt that the patient did not require ICU admission.  Continue IV fluid hydration and monitor sodium level every 4 hours.  Check serum osmolarity, urine sodium and osmolarity.  AKI Likely due to dehydration.  BUN 56, creatinine 1.77 (baseline around 1.0).  Continue IV fluid hydration and monitor renal function.  Avoid  nephrotoxic agents.  Hyperkalemia In the setting of AKI.  Now improved with insulin drip.  Potassium 4.6 on repeat i-STAT chemistry.  Monitor BMP every 4 hours.  Asthma Stable, no signs of acute exacerbation.  Does not use inhalers at home.  Alcohol use Patient denies alcohol use, however, informed by ED RN that EMS reported that he was found with bottles of alcohol around him.  He remains tachycardic.  He was placed on CIWA protocol in the ED, continue.  ADHD, bipolar disorder, depression, oppositional defiant disorder, PTSD, schizoaffective disorder Patient states he does not take any medications at home.  DVT prophylaxis: SQ Heparin Code Status: Full Code (discussed with the patient) Level of care: Progressive Care Unit Admission status: It is my clinical opinion that referral for OBSERVATION is reasonable and necessary in this patient based on the above information provided. The aforementioned taken together are felt to place the patient at high risk for further clinical deterioration. However, it is anticipated that the patient may be medically stable for discharge from the hospital within 24 to 48 hours.  Editha Ram MD [facility]  If 7PM-7AM, please contact night-coverage [email or web address]  07/09/2024, 12:39 AM

## 2024-07-09 NOTE — ED Notes (Signed)
 Pt found to have ripped out iv and all equipment, refused to sit down to have bleeding arm dressed instead walking to bathroom.

## 2024-07-09 NOTE — Progress Notes (Signed)
 Same day note  Ryan Bartlett is a 27 y.o. male with medical history significant of ADHD, bipolar disorder, depression, oppositional defiant disorder, PTSD, schizoaffective disorder, marijuana and tobacco abuse, arthritis, asthma presented to hospital with nausea vomiting and epigastric pain.  Of note patient had recently presented to [facility] long hospital 3 days back for similar problem and was discharged home.  On this presentation patient was tachycardic tachypneic but afebrile.  Labs were notable for leukocytosis at 16.4 with sodium of 111 but blood glucose level was 818, bicarb 16, anion gap 22, BUN 56, creatinine 1.77, beta hydroxybutyric acid 3.81.  UA with >500 glucose, 5 ketones, and not suggestive of infection.  Ethanol level <15.  UDS pending.  CT abdomen pelvis did not show any acute finding except fluid in the esophagus possibly related to esophageal dysmotility or gastroesophageal reflux.  EKG showing sinus tachycardia, T waves appear peaked in comparison to previous EKGs.  Patient was then considered for admission to the hospital for further evaluation and treatment   Patient seen and examined at bedside.  Patient was admitted to the hospital for nausea and vomiting  At the time of my evaluation, patient complains of nausea and sleepiness.  Physical examination reveals appears sleepy, Communicative.  Dry mucosa.  Laboratory data and imaging was reviewed  Assessment and Plan.  New onset diabetes with DKA with hyperglycemia Patient presented with glucose 818, bicarb 16, anion gap 22, beta hydroxybutyric acid 3.81, and VBG with pH 7.32.  Patient was started on insulin drip and IV fluids in the ED. currently on insulin protocol.  Hemoglobin A1c of 12.1.  Will transition to long-acting and short acting insulin when gap closed.  Mild leukocytosis.  Likely reactive.  Nausea and vomiting Likely secondary to DKA.  Improving.  Continue hydration Protonix .  Continue n.p.o./ice chips for  now.   Hyponatremia Initial metabolic panel showing sodium 888 in the setting of glucose 818.  Sodium level 126 at this time.  Will continue to monitor BMP.   AKI Likely secondary to volume depletion.  Initial creatinine of 1.7.  Has improved to 1.5.  Received IV fluid hydration.    Hyperkalemia Improved at this time.  Latest potassium of 3.8   Asthma Not in exacerbation.  Continue inhalers.   Alcohol use On CIWA protocol.   ADHD, bipolar disorder, depression, oppositional defiant disorder, PTSD, schizoaffective disorder Not on medication at home   No Charge  Signed,  Vernal Anselm Alstrom, MD [facility]

## 2024-07-09 NOTE — Inpatient Diabetes Management (Signed)
 Inpatient Diabetes Program Recommendations  AACE/ADA: New Consensus Statement on Inpatient Glycemic Control   Target Ranges:  Prepandial:   less than 140 mg/dL      Peak postprandial:   less than 180 mg/dL (1-2 hours)      Critically ill patients:  140 - 180 mg/dL   Lab Results  Component Value Date   GLUCAP 253 (H) 07/09/2024   HGBA1C 12.1 (H) 07/09/2024    Latest Reference Range & Units 07/09/24 04:28 07/09/24 05:34 07/09/24 06:39 07/09/24 07:30 07/09/24 08:44 07/09/24 09:36  Glucose-Capillary 70 - 99 mg/dL 740 (H) 743 (H) 783 (H) 204 (H) 284 (H) 253 (H)    Latest Reference Range & Units 07/08/24 15:15 07/08/24 15:26 07/08/24 20:13 07/09/24 04:40  Sodium 135 - 145 mmol/L 111 (LL) 111 (LL) 108 (LL) 121 (L) 126 (L)  Potassium 3.5 - 5.1 mmol/L 6.4 (HH) 6.4 (HH) 6.4 (HH) 4.6 3.8  Chloride 98 - 111 mmol/L 73 (L) 81 (L) 87 (L) 86 (L)  CO2 22 - 32 mmol/L 16 (L)   23  Glucose 70 - 99 mg/dL 181 (HH) >299 (HH) 517 (H) 208 (H)  Mean Plasma Glucose mg/dL    699.42  BUN 6 - 20 mg/dL 56 (H) 57 (H) 54 (H) 47 (H)  Creatinine 0.61 - 1.24 mg/dL 8.22 (H) 7.89 (H) 8.19 (H) 1.52 (H)  Calcium  8.9 - 10.3 mg/dL 9.0   8.9  Anion gap 5 - 15  22 (H)   17 (H)    Latest Reference Range & Units 07/08/24 15:15 07/08/24 15:26 07/08/24 20:13 07/09/24 04:40  Beta-Hydroxybutyric Acid 0.05 - 0.27 mmol/L 3.81 (H)     Glucose 70 - 99 mg/dL 181 (HH) >299 (HH) 517 (H) 208 (H)  Hemoglobin A1C 4.8 - 5.6 %    12.1 (H)   Review of Glycemic Control  Diabetes history: none  Outpatient Diabetes medications:  Metformin  500mg  BID   Current orders for Inpatient glycemic control:  IV Insulin    Inpatient Diabetes Program Recommendations:   Noted patient presenting with glucose 818, bicarb 16, anion gap 22, beta hydroxybutyric acid 3.81, and VBG with pH 7.32.   Agree with current plan -  IV insulin  until acidosis is resolved.   Will continue to follow.  Thanks,  Lavanda Search, RN, MSN, [facility]  Inpatient Diabetes  Coordinator  Pager [phone] (8a-5p)

## 2024-07-09 NOTE — ED Notes (Signed)
 Tech/phlebotmist keith unable to get labs, RN Adrieana Fennelly Unable to obtain labs. Phlebotomy asked to stick.

## 2024-07-09 NOTE — Hospital Course (Signed)
 Same day note  Ryan Bartlett is a 28 y.o. male with medical history significant of ADHD, bipolar disorder, depression, oppositional defiant disorder, PTSD, schizoaffective disorder, marijuana and tobacco abuse, arthritis, asthma.  Patient was seen at [facility] ED 3 days ago for URI symptoms and one episode of nonbloody nonbilious emesis.  He tested negative for COVID/flu/RSV and was discharged.  Patient presents to [facility] ED today via EMS with complaints of nausea, vomiting, and epigastric pain.  He was given 100 cc normal saline and 4 mg of Zofran  by EMS.  Patient vomited on arrival.  He was tachycardic and tachypneic on arrival but afebrile and not hypotensive or hypoxic.  Labs notable for WBC count 16.4, hemoglobin 17.9, MCV 77.4, sodium 111, potassium 6.4, chloride 73, glucose 818, bicarb 16, anion gap 22, BUN 56, creatinine 1.77, beta hydroxybutyric acid 3.81.  UA with >500 glucose, 5 ketones, and not suggestive of infection.  Ethanol level <15.  UDS pending.  CT abdomen pelvis showing no acute findings.  Showing fluid within the midesophagus, possibly related to esophageal dysmotility or gastroesophageal reflux.  EKG showing sinus tachycardia, T waves appear peaked in comparison to previous EKGs.  Patient was given Bentyl, Ativan , and started on insulin drip and IV fluids.  Hyponatremia and hyperkalemia improved on repeat i-STAT chemistry.  EDP had discussed the case with critical care and they felt that the patient did not require ICU admission.  TRH called to admit.   Patient is currently awake and alert.  History limited as he did not appear interested in engaging in conversation.  Repeatedly asking for food and water.  Denies history of diabetes.  Reports having nausea and vomiting for the past 3 days.  Denies abdominal pain.  Denies cough, shortness of breath, or chest pain.  Denies alcohol use.  Patient seen and examined at bedside.  Patient was admitted to the hospital for nausea and  vomiting  At the time of my evaluation, patient complains of  Physical examination reveals  Laboratory data and imaging was reviewed  Assessment and Plan.  New onset diabetes with DKA with hyperglycemia Patient presented with glucose 818, bicarb 16, anion gap 22, beta hydroxybutyric acid 3.81, and VBG with pH 7.32.  Patient was started on insulin drip and IV fluids in the ED. currently on insulin protocol.  Hemoglobin A1c of 12.1.  Mild leukocytosis.  Likely reactive.  Nausea and vomiting Likely secondary to DKA.  Improving.  Continue hydration Protonix    Hyponatremia Initial metabolic panel showing sodium 888 in the setting of glucose 818.  Sodium level 126 at this time.    AKI Likely secondary to volume depletion.  Initial creatinine of 1.7.  Has improved to 1.5.  Received IV fluid hydration.    Hyperkalemia Improved at this time.  Latest potassium of 3.8   Asthma Not in exacerbation.  Continue inhalers.   Alcohol use On CIWA protocol.   ADHD, bipolar disorder, depression, oppositional defiant disorder, PTSD, schizoaffective disorder Not on medication at home   No Charge  Signed,  Vernal Anselm Alstrom, MD [facility]

## 2024-07-09 NOTE — Progress Notes (Signed)
 Pt arrived from ..ED..., A/ox .4.Marland Kitchenpt denies any pain, MD aware,CCMD called. CHG bath given,no further needs at this time

## 2024-07-10 DIAGNOSIS — G929 Unspecified toxic encephalopathy: Secondary | ICD-10-CM

## 2024-07-10 DIAGNOSIS — N179 Acute kidney failure, unspecified: Secondary | ICD-10-CM

## 2024-07-10 DIAGNOSIS — R410 Disorientation, unspecified: Secondary | ICD-10-CM

## 2024-07-10 DIAGNOSIS — G4733 Obstructive sleep apnea (adult) (pediatric): Secondary | ICD-10-CM

## 2024-07-10 DIAGNOSIS — F1513 Other stimulant abuse with withdrawal: Secondary | ICD-10-CM

## 2024-07-10 LAB — CBC
HCT: 40.5 % (ref 39.0–52.0)
Hemoglobin: 14.3 g/dL (ref 13.0–17.0)
MCH: 28.2 pg (ref 26.0–34.0)
MCHC: 35.3 g/dL (ref 30.0–36.0)
MCV: 79.9 fL — ABNORMAL LOW (ref 80.0–100.0)
Platelets: 265 K/uL (ref 150–400)
RBC: 5.07 MIL/uL (ref 4.22–5.81)
RDW: 13 % (ref 11.5–15.5)
WBC: 10.8 K/uL — ABNORMAL HIGH (ref 4.0–10.5)
nRBC: 0 % (ref 0.0–0.2)

## 2024-07-10 LAB — BASIC METABOLIC PANEL WITH GFR
Anion gap: 10 (ref 5–15)
Anion gap: 11 (ref 5–15)
Anion gap: 11 (ref 5–15)
Anion gap: 12 (ref 5–15)
Anion gap: 9 (ref 5–15)
BUN: 35 mg/dL — ABNORMAL HIGH (ref 6–20)
BUN: 46 mg/dL — ABNORMAL HIGH (ref 6–20)
BUN: 50 mg/dL — ABNORMAL HIGH (ref 6–20)
BUN: 50 mg/dL — ABNORMAL HIGH (ref 6–20)
BUN: 50 mg/dL — ABNORMAL HIGH (ref 6–20)
CO2: 19 mmol/L — ABNORMAL LOW (ref 22–32)
CO2: 20 mmol/L — ABNORMAL LOW (ref 22–32)
CO2: 21 mmol/L — ABNORMAL LOW (ref 22–32)
CO2: 22 mmol/L (ref 22–32)
CO2: 28 mmol/L (ref 22–32)
Calcium: 7.6 mg/dL — ABNORMAL LOW (ref 8.9–10.3)
Calcium: 7.8 mg/dL — ABNORMAL LOW (ref 8.9–10.3)
Calcium: 7.8 mg/dL — ABNORMAL LOW (ref 8.9–10.3)
Calcium: 7.9 mg/dL — ABNORMAL LOW (ref 8.9–10.3)
Calcium: 8.3 mg/dL — ABNORMAL LOW (ref 8.9–10.3)
Chloride: 100 mmol/L (ref 98–111)
Chloride: 100 mmol/L (ref 98–111)
Chloride: 89 mmol/L — ABNORMAL LOW (ref 98–111)
Chloride: 97 mmol/L — ABNORMAL LOW (ref 98–111)
Chloride: 98 mmol/L (ref 98–111)
Creatinine, Ser: 1.55 mg/dL — ABNORMAL HIGH (ref 0.61–1.24)
Creatinine, Ser: 2.18 mg/dL — ABNORMAL HIGH (ref 0.61–1.24)
Creatinine, Ser: 2.36 mg/dL — ABNORMAL HIGH (ref 0.61–1.24)
Creatinine, Ser: 2.4 mg/dL — ABNORMAL HIGH (ref 0.61–1.24)
Creatinine, Ser: 2.61 mg/dL — ABNORMAL HIGH (ref 0.61–1.24)
GFR, Estimated: 33 mL/min — ABNORMAL LOW (ref >60)
GFR, Estimated: 36 mL/min — ABNORMAL LOW (ref >60)
GFR, Estimated: 37 mL/min — ABNORMAL LOW (ref >60)
GFR, Estimated: 41 mL/min — ABNORMAL LOW (ref >60)
GFR, Estimated: >60 mL/min (ref >60)
Glucose, Bld: 123 mg/dL — ABNORMAL HIGH (ref 70–99)
Glucose, Bld: 157 mg/dL — ABNORMAL HIGH (ref 70–99)
Glucose, Bld: 158 mg/dL — ABNORMAL HIGH (ref 70–99)
Glucose, Bld: 189 mg/dL — ABNORMAL HIGH (ref 70–99)
Glucose, Bld: 225 mg/dL — ABNORMAL HIGH (ref 70–99)
Potassium: 4.1 mmol/L (ref 3.5–5.1)
Potassium: 4.2 mmol/L (ref 3.5–5.1)
Potassium: 4.4 mmol/L (ref 3.5–5.1)
Potassium: 4.5 mmol/L (ref 3.5–5.1)
Potassium: 5.1 mmol/L (ref 3.5–5.1)
Sodium: 126 mmol/L — ABNORMAL LOW (ref 135–145)
Sodium: 128 mmol/L — ABNORMAL LOW (ref 135–145)
Sodium: 131 mmol/L — ABNORMAL LOW (ref 135–145)
Sodium: 131 mmol/L — ABNORMAL LOW (ref 135–145)
Sodium: 131 mmol/L — ABNORMAL LOW (ref 135–145)

## 2024-07-10 LAB — COMPREHENSIVE METABOLIC PANEL WITH GFR
ALT: 32 U/L (ref 0–44)
AST: 31 U/L (ref 15–41)
Albumin: 3 g/dL — ABNORMAL LOW (ref 3.5–5.0)
Alkaline Phosphatase: 59 U/L (ref 38–126)
Anion gap: 13 (ref 5–15)
BUN: 39 mg/dL — ABNORMAL HIGH (ref 6–20)
CO2: 21 mmol/L — ABNORMAL LOW (ref 22–32)
Calcium: 8 mg/dL — ABNORMAL LOW (ref 8.9–10.3)
Chloride: 95 mmol/L — ABNORMAL LOW (ref 98–111)
Creatinine, Ser: 1.82 mg/dL — ABNORMAL HIGH (ref 0.61–1.24)
GFR, Estimated: 51 mL/min — ABNORMAL LOW (ref >60)
Glucose, Bld: 156 mg/dL — ABNORMAL HIGH (ref 70–99)
Potassium: 4.5 mmol/L (ref 3.5–5.1)
Sodium: 129 mmol/L — ABNORMAL LOW (ref 135–145)
Total Bilirubin: 1.2 mg/dL (ref 0.0–1.2)
Total Protein: 6.3 g/dL — ABNORMAL LOW (ref 6.5–8.1)

## 2024-07-10 LAB — CK: Total CK: 392 U/L (ref 49–397)

## 2024-07-10 LAB — GLUCOSE, CAPILLARY
Glucose-Capillary: 136 mg/dL — ABNORMAL HIGH (ref 70–99)
Glucose-Capillary: 143 mg/dL — ABNORMAL HIGH (ref 70–99)
Glucose-Capillary: 148 mg/dL — ABNORMAL HIGH (ref 70–99)
Glucose-Capillary: 154 mg/dL — ABNORMAL HIGH (ref 70–99)
Glucose-Capillary: 154 mg/dL — ABNORMAL HIGH (ref 70–99)
Glucose-Capillary: 160 mg/dL — ABNORMAL HIGH (ref 70–99)
Glucose-Capillary: 161 mg/dL — ABNORMAL HIGH (ref 70–99)
Glucose-Capillary: 162 mg/dL — ABNORMAL HIGH (ref 70–99)
Glucose-Capillary: 170 mg/dL — ABNORMAL HIGH (ref 70–99)
Glucose-Capillary: 172 mg/dL — ABNORMAL HIGH (ref 70–99)
Glucose-Capillary: 177 mg/dL — ABNORMAL HIGH (ref 70–99)
Glucose-Capillary: 181 mg/dL — ABNORMAL HIGH (ref 70–99)
Glucose-Capillary: 185 mg/dL — ABNORMAL HIGH (ref 70–99)
Glucose-Capillary: 188 mg/dL — ABNORMAL HIGH (ref 70–99)
Glucose-Capillary: 196 mg/dL — ABNORMAL HIGH (ref 70–99)
Glucose-Capillary: 203 mg/dL — ABNORMAL HIGH (ref 70–99)
Glucose-Capillary: 223 mg/dL — ABNORMAL HIGH (ref 70–99)

## 2024-07-10 LAB — MRSA NEXT GEN BY PCR, NASAL: MRSA by PCR Next Gen: NOT DETECTED

## 2024-07-10 MED ORDER — PANTOPRAZOLE SODIUM 40 MG IV SOLR
40.0000 mg | INTRAVENOUS | Status: DC
  Administered 2024-07-10 – 2024-07-13 (×4): 40 mg via INTRAVENOUS
  Filled 2024-07-10 (×4): qty 10

## 2024-07-10 MED ORDER — PHENOBARBITAL SODIUM 65 MG/ML IJ SOLN
65.0000 mg | Freq: Three times a day (TID) | INTRAMUSCULAR | Status: AC
  Administered 2024-07-12 – 2024-07-14 (×6): 65 mg via INTRAVENOUS
  Filled 2024-07-10 (×6): qty 1

## 2024-07-10 MED ORDER — THIAMINE HCL 100 MG/ML IJ SOLN
200.0000 mg | INTRAVENOUS | Status: DC
  Administered 2024-07-10 – 2024-07-12 (×3): 200 mg via INTRAVENOUS
  Filled 2024-07-10 (×5): qty 2

## 2024-07-10 MED ORDER — THIAMINE MONONITRATE 100 MG PO TABS: 100.0000 mg | ORAL_TABLET | ORAL | Status: DC

## 2024-07-10 MED ORDER — STERILE WATER FOR INJECTION IJ SOLN
INTRAMUSCULAR | Status: AC
Start: 2024-07-10 — End: 2024-07-10
  Administered 2024-07-10: 1.2 mL
  Filled 2024-07-10: qty 10

## 2024-07-10 MED ORDER — PHENOBARBITAL SODIUM 130 MG/ML IJ SOLN
97.5000 mg | Freq: Three times a day (TID) | INTRAMUSCULAR | Status: AC
  Administered 2024-07-10 – 2024-07-12 (×6): 97.5 mg via INTRAVENOUS
  Filled 2024-07-10 (×6): qty 1

## 2024-07-10 MED ORDER — DEXMEDETOMIDINE HCL IN NACL 400 MCG/100ML IV SOLN
0.0000 ug/kg/h | INTRAVENOUS | Status: DC
  Administered 2024-07-10: 0.6 ug/kg/h via INTRAVENOUS
  Administered 2024-07-10: 1.1 ug/kg/h via INTRAVENOUS
  Administered 2024-07-10: 0.4 ug/kg/h via INTRAVENOUS
  Administered 2024-07-10: 1.1 ug/kg/h via INTRAVENOUS
  Administered 2024-07-10: 0.7 ug/kg/h via INTRAVENOUS
  Administered 2024-07-10: 1.8 ug/kg/h via INTRAVENOUS
  Administered 2024-07-11: 0.9 ug/kg/h via INTRAVENOUS
  Administered 2024-07-11: 1.5 ug/kg/h via INTRAVENOUS
  Administered 2024-07-11: 0.7 ug/kg/h via INTRAVENOUS
  Administered 2024-07-11: 1.2 ug/kg/h via INTRAVENOUS
  Administered 2024-07-11: 1.1 ug/kg/h via INTRAVENOUS
  Administered 2024-07-11: 2 ug/kg/h via INTRAVENOUS
  Administered 2024-07-12: 0.6 ug/kg/h via INTRAVENOUS
  Administered 2024-07-12: 2 ug/kg/h via INTRAVENOUS
  Administered 2024-07-12: 0.8 ug/kg/h via INTRAVENOUS
  Administered 2024-07-12: 1.8 ug/kg/h via INTRAVENOUS
  Administered 2024-07-12: 1.2 ug/kg/h via INTRAVENOUS
  Administered 2024-07-12: 1.5 ug/kg/h via INTRAVENOUS
  Administered 2024-07-13: 0.6 ug/kg/h via INTRAVENOUS
  Filled 2024-07-10 (×5): qty 100
  Filled 2024-07-10: qty 300
  Filled 2024-07-10 (×8): qty 100
  Filled 2024-07-10: qty 200
  Filled 2024-07-10: qty 100

## 2024-07-10 MED ORDER — PHENOBARBITAL SODIUM 65 MG/ML IJ SOLN
32.5000 mg | Freq: Three times a day (TID) | INTRAMUSCULAR | Status: DC
  Administered 2024-07-14 – 2024-07-15 (×4): 32.5 mg via INTRAVENOUS
  Filled 2024-07-10 (×4): qty 1

## 2024-07-10 MED ORDER — ZIPRASIDONE MESYLATE 20 MG IM SOLR
20.0000 mg | Freq: Once | INTRAMUSCULAR | Status: AC
  Administered 2024-07-10: 20 mg via INTRAMUSCULAR
  Filled 2024-07-10 (×2): qty 20

## 2024-07-10 MED ORDER — SODIUM CHLORIDE 0.9 % IV SOLN
1.0000 mg | Freq: Once | INTRAVENOUS | Status: AC
  Administered 2024-07-10: 1 mg via INTRAVENOUS
  Filled 2024-07-10: qty 0.2

## 2024-07-10 MED ORDER — CHLORHEXIDINE GLUCONATE CLOTH 2 % EX PADS
6.0000 | MEDICATED_PAD | Freq: Every day | CUTANEOUS | Status: DC
  Administered 2024-07-10 – 2024-07-15 (×5): 6 via TOPICAL

## 2024-07-10 MED ORDER — DEXTROSE IN LACTATED RINGERS 5 % IV SOLN: INTRAVENOUS | Status: DC

## 2024-07-10 MED ORDER — LACTATED RINGERS IV BOLUS
1000.0000 mL | Freq: Once | INTRAVENOUS | Status: AC
  Administered 2024-07-10: 1000 mL via INTRAVENOUS

## 2024-07-10 MED ORDER — LORAZEPAM 2 MG/ML IJ SOLN
1.0000 mg | INTRAMUSCULAR | Status: DC | PRN
  Administered 2024-07-11 (×2): 1 mg via INTRAVENOUS
  Filled 2024-07-10 (×2): qty 1

## 2024-07-10 NOTE — Progress Notes (Signed)
 Asked to assist with expediting transfer to ICU. Pt txed to 2H16 with 4E RN x 3 and RR RN.

## 2024-07-10 NOTE — Inpatient Diabetes Management (Signed)
 Inpatient Diabetes Program Recommendations  AACE/ADA: New Consensus Statement on Inpatient Glycemic Control   Target Ranges:  Prepandial:   less than 140 mg/dL      Peak postprandial:   less than 180 mg/dL (1-2 hours)      Critically ill patients:  140 - 180 mg/dL    Latest Reference Range & Units 07/10/24 00:47 07/10/24 02:23 07/10/24 03:36 07/10/24 04:40 07/10/24 05:52 07/10/24 06:51 07/10/24 08:04  Glucose-Capillary 70 - 99 mg/dL 845 (H) 776 (H) 811 (H) 181 (H) 196 (H) 161 (H) 170 (H)    Latest Reference Range & Units 07/08/24 15:15  CO2 22 - 32 mmol/L 16 (L)  Glucose 70 - 99 mg/dL 181 (HH)  BUN 6 - 20 mg/dL 56 (H)  Creatinine 9.38 - 1.24 mg/dL 8.22 (H)  Calcium  8.9 - 10.3 mg/dL 9.0  Anion gap 5 - 15  22 (H)    Latest Reference Range & Units 07/08/24 15:15  Beta-Hydroxybutyric Acid 0.05 - 0.27 mmol/L 3.81 (H)    Latest Reference Range & Units 08/09/16 06:32 08/10/16 06:30 08/11/16 06:33 10/19/20 06:07 07/09/24 04:40  Hemoglobin A1C 4.8 - 5.6 % 5.6 5.7 (H) 5.6 6.5 (H) 12.1 (H)   Review of Glycemic Control  Diabetes history: No  Outpatient Diabetes medications: Metformin  XR 500 mg BID Current orders for Inpatient glycemic control: IV insulin   Inpatient Diabetes Program Recommendations:    Labs: May want to consider ordering Beta-Hydroxybutyric acid (only checked once since arrival).   Insulin : Once provider is ready to transition from IV to SQ insulin , please consider ordering Semglee  20 units Q24H (based on 101 kg x 0.2 units), CBGs Q4H, Novolog  0-15 units Q4H.  NOTE: Patient admitted with DKA on 07/09/24 with new DM dx, initial glucose 818 mg/ld and patient was started on IV insulin  per DKA order set. Patient is currently still ordered IV insulin .  Noted rapid response on patient around 1:30 am today; patient in restraints, agitated, and encephalopathic; started on Precedex . Will need DM education when appropriate.  Thanks, Earnie Gainer, RN, MSN, [facility] Diabetes  Coordinator Inpatient Diabetes Program [phone] (Team Pager from 8am to 5pm)

## 2024-07-10 NOTE — Consult Note (Signed)
 NAME:  Ryan Bartlett, MRN:  [id number], DOB:  1995/11/05, LOS: 1 ADMISSION DATE:  07/08/2024, CONSULTATION DATE:  07/10/24 REFERRING MD:  TRH, CHIEF COMPLAINT:  agitated encephalopathy   History of Present Illness:  28 yo male presented to [facility] 11/19 with complaints of nausea and vomiting. He was found to have hyperglycemic with elevated beta hydroxybutyric acid level.   Pt was recently admitted to [facility] for URI symptoms, along with n/v and was discharged quickly. He presented this evening from home via EMS with same symptoms along with abdominal pain. He had vomited numerous time since presentation. VSS were relatively stable with exception of tachycardia and tachypnea. Array of metabolic derrangements discovered. He was admitted started on insulin , metabolic issues attempted to be corrected. However this am pt became increasingly agitated, breaking his restraints, pulling iv's all while still having intermittent sonorous respirations with apenic periods c/w sleep apnea. Yesterday it appears there was arising concerns about signs/symptoms of withdrawal syndrome which has only worsened into this am.   Ccm was consulted for request for precedex  utilization  Pertinent  Medical History  Adha Bipolar d/o  Depression Oppositional defiant disorder Ptsd Schizoaffective d/o Marijuana use Tobacco use asthma  Significant Hospital Events: Including procedures, antibiotic start and stop dates in addition to other pertinent events   Admitted to [facility] 11/19 for n/v  Interim History / Subjective:    Objective    Blood pressure 120/73, pulse (!) 110, temperature 97.7 F (36.5 C), resp. rate (!) 21, SpO2 99%.        Intake/Output Summary (Last 24 hours) at 07/10/2024 0122 Last data filed at 07/09/2024 2342 Gross per 24 hour  Intake 324.56 ml  Output 500 ml  Net -175.44 ml   There were no vitals filed for this visit.  Examination: General: acutely agitated thrashing in bed then will lay  down rest and snore occasional apneic periods HENT: ncat eomi, perrla, mmmp Lungs: ctab Cardiovascular: tachycardic Abdomen: obese, protuberant, nt, bs+ Extremities: no c/c/e Neuro: no focal deficits but not following commands thrashing in bed, aggressive GU: deferred  Resolved problem list   Assessment and Plan  Acute withdrawal syndrome, suspected Acute toxic encephalopathy Osa Dka, new onset dm New onset dm with hyperglycemia Adhd Bipolar disorder Depression Schizoaffective d/o Aki Hyponatremia Illicit substance use (amphetamines/benzo) -ativan  appears to be exacerbating the pt's agitation and confusion -will need transfer to ICU for closer monitoring -haldol  without effect per bedside RN -unable to take phenobarb at this time 2/2 sonorous respirations and concern for airway protection. May req intubation to control agitation and maintain airway appropriately -will give dose of IM geodon  as pt has lost iv access at this time and unable to take po -start precedex , once able will start phenobarb taper for withdrawal -pt's only medication per mom at bedside is aristada  -will need substance abuse counseling when appropriate -will need to follow renal indices and uop   Labs   CBC: Recent Labs  Lab 07/08/24 1515 07/08/24 1526 07/08/24 2013 07/09/24 0440  WBC 16.4*  --   --  14.9*  NEUTROABS 12.9*  --   --   --   HGB 17.9* 19.4*  18.4* 18.7* 17.3*  HCT 48.7 57.0*  54.0* 55.0* 47.0  MCV 77.4*  --   --  77.0*  PLT 336  --   --  311    Basic Metabolic Panel: Recent Labs  Lab 07/08/24 1515 07/08/24 1526 07/08/24 2013 07/09/24 0440 07/09/24 1219 07/09/24 2025 07/09/24 2349  NA 111*   < > 121* 126* 122* 124* 126*  K 6.4*   < > 4.6 3.8 3.5 4.6 4.2  CL 73*   < > 87* 86* 85* 88* 89*  CO2 16*  --   --  23 24 19* 28  GLUCOSE 818*   < > 482* 208* 252* 277* 225*  BUN 56*   < > 54* 47* 40* 37* 35*  CREATININE 1.77*   < > 1.80* 1.52* 1.33* 1.43* 1.55*  CALCIUM  9.0   --   --  8.9 8.5* 8.4* 8.3*   < > = values in this interval not displayed.   GFR: Estimated Creatinine Clearance: 81.6 mL/min (A) (by C-G formula based on SCr of 1.55 mg/dL (H)). Recent Labs  Lab 07/08/24 1515 07/09/24 0440  WBC 16.4* 14.9*    Liver Function Tests: No results for input(s): AST, ALT, ALKPHOS, BILITOT, PROT, ALBUMIN in the last 168 hours. No results for input(s): LIPASE, AMYLASE in the last 168 hours. No results for input(s): AMMONIA in the last 168 hours.  ABG    Component Value Date/Time   HCO3 24.0 07/08/2024 1526   TCO2 25 07/08/2024 2013   ACIDBASEDEF 3.0 (H) 07/08/2024 1526   O2SAT 76 07/08/2024 1526     Coagulation Profile: No results for input(s): INR, PROTIME in the last 168 hours.  Cardiac Enzymes: No results for input(s): CKTOTAL, CKMB, CKMBINDEX, TROPONINI in the last 168 hours.  HbA1C: Hgb A1c MFr Bld  Date/Time Value Ref Range Status  07/09/2024 04:40 AM 12.1 (H) 4.8 - 5.6 % Final    Comment:    (NOTE) Diagnosis of Diabetes The following HbA1c ranges recommended by the American Diabetes Association (ADA) may be used as an aid in the diagnosis of diabetes mellitus.  Hemoglobin             Suggested A1C NGSP%              Diagnosis  <5.7                   Non Diabetic  5.7-6.4                Pre-Diabetic  >6.4                   Diabetic  <7.0                   Glycemic control for                       adults with diabetes.    10/19/2020 06:07 AM 6.5 (H) 4.8 - 5.6 % Final    Comment:    (NOTE) Pre diabetes:          5.7%-6.4%  Diabetes:              >6.4%  Glycemic control for   <7.0% adults with diabetes     CBG: Recent Labs  Lab 07/09/24 2042 07/09/24 2145 07/09/24 2250 07/09/24 2346 07/10/24 0047  GLUCAP 255* 348* 262* 254* 154*    Review of Systems:   Unobtainable 2/2 pt's encephalopathy  Past Medical History:  He,  has a past medical history of ADHD (attention deficit  hyperactivity disorder), Arthritis, Asthma, Bipolar disorder (HCC), Depression, Oppositional defiant disorder, PTSD (post-traumatic stress disorder), Schizoaffective disorder (HCC), and Unspecified episodic mood disorder.   Surgical History:   Past Surgical History:  Procedure Laterality Date   NO PAST SURGERIES  Social History:   reports that he has been smoking cigarettes. He has never used smokeless tobacco. He reports current alcohol use. He reports that he does not currently use drugs after having used the following drugs: Marijuana.   Family History:  His family history includes Depression in his mother; Diabetes in an other family member; Hyperlipidemia in an other family member; Hypertension in an other family member.   Allergies Allergies  Allergen Reactions   Peanut-Containing Drug Products Anaphylaxis   Lactose Intolerance (Gi) Diarrhea and Nausea And Vomiting     Home Medications  Prior to Admission medications   Medication Sig Start Date End Date Taking? Authorizing Provider  ARISTADA  1064 MG/3.9ML prefilled syringe SMARTSIG:106 Milligram(s) IM Every 4 Weeks 04/01/24  Yes [provider]  metFORMIN  (GLUCOPHAGE -XR) 500 MG 24 hr tablet Take 500 mg by mouth 2 (two) times daily. 06/18/24  Yes [provider]  acetaminophen  (TYLENOL ) 500 MG tablet Take 1 tablet (500 mg total) by mouth every 6 (six) hours as needed. 07/05/24   Nivia Colon, PA-C  ARIPiprazole  (ABILIFY ) 10 MG tablet Take 1 tablet (10 mg total) by mouth daily. Patient not taking: Reported on 07/09/2024 10/20/20   Money, Caron NOVAK, FNP  benzonatate  (TESSALON ) 100 MG capsule Take 1 capsule (100 mg total) by mouth every 8 (eight) hours. 07/05/24   Nivia Colon, PA-C  busPIRone  (BUSPAR ) 15 MG tablet Take 1 tablet (15 mg total) by mouth 3 (three) times daily. 10/19/20   Money, Caron NOVAK, FNP  hydrOXYzine  (ATARAX /VISTARIL ) 50 MG tablet Take 1 tablet (50 mg total) by mouth 3 (three) times daily as needed  for anxiety. Patient not taking: Reported on 07/09/2024 10/19/20   Money, Caron NOVAK, FNP  lidocaine  (XYLOCAINE ) 2 % solution Use as directed 15 mLs in the mouth or throat as needed for mouth pain. 07/05/24   Nivia Colon, PA-C  OXcarbazepine  (TRILEPTAL ) 150 MG tablet Take 1 tablet (150 mg total) by mouth 2 (two) times daily. 10/19/20   Money, Caron NOVAK, FNP  albuterol  (PROVENTIL  HFA;VENTOLIN  HFA) 108 (90 Base) MCG/ACT inhaler Inhale 1-2 puffs into the lungs every 4 (four) hours as needed for wheezing or shortness of breath. 04/11/18 03/11/20  Collene Gouge I, NP  divalproex  (DEPAKOTE ) 500 MG DR tablet Take 1 tablet (500 mg total) by mouth every 12 (twelve) hours. For mood stabilization Patient not taking: Reported on 10/03/2019 04/11/18 03/11/20  Collene Gouge I, NP  omeprazole  (PRILOSEC) 20 MG capsule Take 1 capsule (20 mg total) by mouth daily. Patient not taking: Reported on 10/03/2019 12/14/18 03/11/20  Carita Senior, MD     Critical care time: 

## 2024-07-10 NOTE — Progress Notes (Addendum)
 NAME:  Ryan Bartlett, MRN:  [id number], DOB:  1996/06/05, LOS: 1 ADMISSION DATE:  07/08/2024, CONSULTATION DATE:  07/10/24 REFERRING MD:  TRH, CHIEF COMPLAINT:  agitated encephalopathy   History of Present Illness:  28 yo male presented to [facility] 11/19 with complaints of nausea and vomiting. He was found to have hyperglycemic with elevated beta hydroxybutyric acid level.   Pt was recently admitted to [facility] for URI symptoms, along with n/v and was discharged quickly. He presented this evening from home via EMS with same symptoms along with abdominal pain. He had vomited numerous time since presentation. VSS were relatively stable with exception of tachycardia and tachypnea. Array of metabolic derrangements discovered. He was admitted started on insulin , metabolic issues attempted to be corrected. However this am pt became increasingly agitated, breaking his restraints, pulling iv's all while still having intermittent sonorous respirations with apenic periods c/w sleep apnea. Yesterday it appears there was arising concerns about signs/symptoms of withdrawal syndrome which has only worsened into this am.   Ccm was consulted for request for precedex  utilization  Pertinent  Medical History  Adha Bipolar d/o  Depression Oppositional defiant disorder Ptsd Schizoaffective d/o Marijuana use Tobacco use asthma  Significant Hospital Events: Including procedures, antibiotic start and stop dates in addition to other pertinent events   Admitted to [facility] 11/19 for n/v  Interim History / Subjective:  Severe agitation overnight requiring precedex  infusion and geodon .  Remains on precedex  infusion , sleeping  Objective    Blood pressure (!) 83/69, pulse 85, temperature (!) 97.2 F (36.2 C), temperature source Axillary, resp. rate (!) 26, SpO2 98%.        Intake/Output Summary (Last 24 hours) at 07/10/2024 0829 Last data filed at 07/10/2024 0800 Gross per 24 hour  Intake 1785.25 ml  Output 560 ml   Net 1225.25 ml   There were no vitals filed for this visit.  Examination: General: adult male on precedex  infusion, RASS -2 HENT: [place]/AT Lungs: coarse throughout, no increased work of breathing Cardiovascular: regular rhythm, rate, S1/S2 Abdomen: obese, protuberant, nt, bs+ Extremities: no c/c/e Neuro: lethargic, wakes up and follows commands GU: deferred  Resolved problem list   Assessment and Plan  Acute alcohol withdrawal syndrome, suspected Acute toxic encephalopathy Bipolar disorder Depression Schizoaffective d/o Illicit substance use (amphetamines/benzo) - Home meds: aristada , next dose due 11/25 - discussed with [facility], they will release the medication to family and they will bring in for continued compliance next week.  - UDS on 11/20 (admitted 11/19) + amphetamines and benzo's - worsening agitation with ativan  and transferred for precedex  infusion and ultimately required geodon  - Mom at bedside, concerned he was drinking more ETOH than he should be - Unable to utilize phenobarb PO due to severe agitation when waking up.  - Discuss with team - Phenobarb IV scheduled with taper dose , wean precedex . Still lethargic from geodon  this morning and would start later this morning - Thiamine   Dka, resolved Known DM - Hold home metformin   - Improved, gap closed with improving bicarb.   - Continues to have high insulin  requirements on insulin  infusion, continue for now  Aki - Last creatinine in our system 2022-0.9 - Creatinine on presentation 1.77 with improvement down to 1.33 and rising again.  Suspect hypovolemia with DKA and need for fluid resuscitation - Started on IVF overnight, serial BMP and follow UOP- suspect this will improve now that he has IVF resuscitation  Hyponatremia - Initial sodium 11/19 111 but corrected  for BG = 122, corrected sodium on 11/20 am 128 (6 meq rise in 24 hours) - Home meds may play component as well as etoh use - Continue to  trend  Labs   CBC: Recent Labs  Lab 07/08/24 1515 07/08/24 1526 07/08/24 2013 07/09/24 0440 07/10/24 0638  WBC 16.4*  --   --  14.9* 10.8*  NEUTROABS 12.9*  --   --   --   --   HGB 17.9* 19.4*  18.4* 18.7* 17.3* 14.3  HCT 48.7 57.0*  54.0* 55.0* 47.0 40.5  MCV 77.4*  --   --  77.0* 79.9*  PLT 336  --   --  311 265    Basic Metabolic Panel: Recent Labs  Lab 07/09/24 0440 07/09/24 1219 07/09/24 2025 07/09/24 2349 07/10/24 0638  NA 126* 122* 124* 126* 129*  K 3.8 3.5 4.6 4.2 4.5  CL 86* 85* 88* 89* 95*  CO2 23 24 19* 28 21*  GLUCOSE 208* 252* 277* 225* 156*  BUN 47* 40* 37* 35* 39*  CREATININE 1.52* 1.33* 1.43* 1.55* 1.82*  CALCIUM  8.9 8.5* 8.4* 8.3* 8.0*   GFR: Estimated Creatinine Clearance: 69.5 mL/min (A) (by C-G formula based on SCr of 1.82 mg/dL (H)). Recent Labs  Lab 07/08/24 1515 07/09/24 0440 07/10/24 0638  WBC 16.4* 14.9* 10.8*    Liver Function Tests: Recent Labs  Lab 07/10/24 0638  AST 31  ALT 32  ALKPHOS 59  BILITOT 1.2  PROT 6.3*  ALBUMIN 3.0*   No results for input(s): LIPASE, AMYLASE in the last 168 hours. No results for input(s): AMMONIA in the last 168 hours.  ABG    Component Value Date/Time   HCO3 24.0 07/08/2024 1526   TCO2 25 07/08/2024 2013   ACIDBASEDEF 3.0 (H) 07/08/2024 1526   O2SAT 76 07/08/2024 1526     Coagulation Profile: No results for input(s): INR, PROTIME in the last 168 hours.  Cardiac Enzymes: No results for input(s): CKTOTAL, CKMB, CKMBINDEX, TROPONINI in the last 168 hours.  HbA1C: Hgb A1c MFr Bld  Date/Time Value Ref Range Status  07/09/2024 04:40 AM 12.1 (H) 4.8 - 5.6 % Final    Comment:    (NOTE) Diagnosis of Diabetes The following HbA1c ranges recommended by the American Diabetes Association (ADA) may be used as an aid in the diagnosis of diabetes mellitus.  Hemoglobin             Suggested A1C NGSP%              Diagnosis  <5.7                   Non Diabetic  5.7-6.4                 Pre-Diabetic  >6.4                   Diabetic  <7.0                   Glycemic control for                       adults with diabetes.    10/19/2020 06:07 AM 6.5 (H) 4.8 - 5.6 % Final    Comment:    (NOTE) Pre diabetes:          5.7%-6.4%  Diabetes:              >6.4%  Glycemic control for   <7.0%  adults with diabetes     CBG: Recent Labs  Lab 07/10/24 0336 07/10/24 0440 07/10/24 0552 07/10/24 0651 07/10/24 0804  GLUCAP 188* 181* 196* 161* 170*    Critical care time: 35 min     Lucie Irving, ACNP

## 2024-07-10 NOTE — Progress Notes (Signed)
 eLink Physician-Brief Progress Note Patient Name: Ryan Bartlett DOB: Jun 25, 1996 MRN: [id number]   Date of Service  07/10/2024  HPI/Events of Note  28 y.o. male with medical history significant of ADHD, bipolar disorder, depression, oppositional defiant disorder, PTSD, schizoaffective disorder, marijuana and tobacco abuse, arthritis, asthma presented to hospital with nausea vomiting and epigastric pain found to be in acute diabetic ketoacidosis, new diagnosis of diabetes with severe encephalopathy.  Patient is tachypneic, tachycardic, and hypertensive.  Saturating 100% on room air.  Precedex  infusing, insulin  infusing.  Patient has hyponatremia, hyperglycemia and acute kidney injury.  Positive for amphetamines and benzos.  Results reviewed.  eICU Interventions  Patient is severely encephalopathic despite Haldol  earlier, Geodon  pending, verbally increase Precedex  rapidly to 1.8 mcg.  Maintain restraints.  Maintain insulin  infusion, IV fluids, and electrolyte repletion.  Multivitamins and thiamine  ordered.  DVT prophy status with heparin  GI prophylaxis with pantoprazole      Intervention Category Evaluation Type: New Patient Evaluation  [name] 07/10/2024, 1:54 AM

## 2024-07-10 NOTE — Significant Event (Signed)
 Patient's nurse notified me that patient was getting more agitated.  On exam at bedside patient is confused and agitated.  Blood pressure is 150/120.  Pulse is 105/min temperature 98.6.  I reviewed patient's notes labs and medications.  Patient has so far received 15 mg of Ativan  IV the last few hours per CIWA protocol.  Discussed with Dr. Layman pulmonary critical care and will be starting patient on Precedex .  Updated patient's nurse and rapid response Ms. Graeme Roses.  Redia Cleaver. MD.

## 2024-07-10 NOTE — Plan of Care (Addendum)

## 2024-07-10 NOTE — Progress Notes (Signed)
 Pt given total of 15mg  of ativan  this shift with little to no improvement CIWA score between 17-20. Rapid RN and on call provider notified.

## 2024-07-10 NOTE — TOC CM/SW Note (Signed)
 Transition of Care (TOC) CM/SW Note    TOC consult received for substance abuse. SW to follow up with patient as appropriate.

## 2024-07-10 NOTE — Progress Notes (Signed)
 Pt transferred to 2 heart ICU at this time with rapid RN , care RN , Consulting Civil Engineer . Vitals remained stable. Bed side report was given to receiving ICU RN .

## 2024-07-10 NOTE — Progress Notes (Signed)
 Pt agitated ,restless,  impulsive in 4 point restraints has broken right arm restraint and left leg restraint. Ativan  is not yet due , pt on CIWA protocal. On call provider paged and now at bedside , per provider instructed to give additional dose of ativan . Rapid RN notified of the  above. See new orders .

## 2024-07-11 ENCOUNTER — Encounter (HOSPITAL_COMMUNITY): Payer: Self-pay | Admitting: Internal Medicine

## 2024-07-11 DIAGNOSIS — D649 Anemia, unspecified: Secondary | ICD-10-CM

## 2024-07-11 DIAGNOSIS — F250 Schizoaffective disorder, bipolar type: Secondary | ICD-10-CM

## 2024-07-11 DIAGNOSIS — G928 Other toxic encephalopathy: Secondary | ICD-10-CM

## 2024-07-11 DIAGNOSIS — I10 Essential (primary) hypertension: Secondary | ICD-10-CM

## 2024-07-11 LAB — BASIC METABOLIC PANEL WITH GFR
Anion gap: 9 (ref 5–15)
Anion gap: 15 (ref 5–15)
BUN: 37 mg/dL — ABNORMAL HIGH (ref 6–20)
BUN: 32 mg/dL — ABNORMAL HIGH (ref 6–20)
CO2: 24 mmol/L (ref 22–32)
CO2: 21 mmol/L — ABNORMAL LOW (ref 22–32)
Calcium: 8 mg/dL — ABNORMAL LOW (ref 8.9–10.3)
Calcium: 7.9 mg/dL — ABNORMAL LOW (ref 8.9–10.3)
Chloride: 101 mmol/L (ref 98–111)
Chloride: 102 mmol/L (ref 98–111)
Creatinine, Ser: 1.71 mg/dL — ABNORMAL HIGH (ref 0.61–1.24)
Creatinine, Ser: 1.52 mg/dL — ABNORMAL HIGH (ref 0.61–1.24)
GFR, Estimated: 55 mL/min — ABNORMAL LOW (ref >60)
GFR, Estimated: >60 mL/min (ref >60)
Glucose, Bld: 123 mg/dL — ABNORMAL HIGH (ref 70–99)
Glucose, Bld: 128 mg/dL — ABNORMAL HIGH (ref 70–99)
Potassium: 3.7 mmol/L (ref 3.5–5.1)
Potassium: 3.6 mmol/L (ref 3.5–5.1)
Sodium: 134 mmol/L — ABNORMAL LOW (ref 135–145)
Sodium: 138 mmol/L (ref 135–145)

## 2024-07-11 LAB — COMPREHENSIVE METABOLIC PANEL WITH GFR
ALT: 24 U/L (ref 0–44)
AST: 25 U/L (ref 15–41)
Albumin: 2.6 g/dL — ABNORMAL LOW (ref 3.5–5.0)
Alkaline Phosphatase: 51 U/L (ref 38–126)
Anion gap: 12 (ref 5–15)
BUN: 47 mg/dL — ABNORMAL HIGH (ref 6–20)
CO2: 20 mmol/L — ABNORMAL LOW (ref 22–32)
Calcium: 7.7 mg/dL — ABNORMAL LOW (ref 8.9–10.3)
Chloride: 102 mmol/L (ref 98–111)
Creatinine, Ser: 2.03 mg/dL — ABNORMAL HIGH (ref 0.61–1.24)
GFR, Estimated: 44 mL/min — ABNORMAL LOW (ref >60)
Glucose, Bld: 148 mg/dL — ABNORMAL HIGH (ref 70–99)
Potassium: 4 mmol/L (ref 3.5–5.1)
Sodium: 134 mmol/L — ABNORMAL LOW (ref 135–145)
Total Bilirubin: 1.1 mg/dL (ref 0.0–1.2)
Total Protein: 6.1 g/dL — ABNORMAL LOW (ref 6.5–8.1)

## 2024-07-11 LAB — GLUCOSE, CAPILLARY
Glucose-Capillary: 157 mg/dL — ABNORMAL HIGH (ref 70–99)
Glucose-Capillary: 139 mg/dL — ABNORMAL HIGH (ref 70–99)
Glucose-Capillary: 154 mg/dL — ABNORMAL HIGH (ref 70–99)
Glucose-Capillary: 146 mg/dL — ABNORMAL HIGH (ref 70–99)
Glucose-Capillary: 187 mg/dL — ABNORMAL HIGH (ref 70–99)
Glucose-Capillary: 196 mg/dL — ABNORMAL HIGH (ref 70–99)
Glucose-Capillary: 144 mg/dL — ABNORMAL HIGH (ref 70–99)
Glucose-Capillary: 149 mg/dL — ABNORMAL HIGH (ref 70–99)
Glucose-Capillary: 172 mg/dL — ABNORMAL HIGH (ref 70–99)
Glucose-Capillary: 141 mg/dL — ABNORMAL HIGH (ref 70–99)
Glucose-Capillary: 134 mg/dL — ABNORMAL HIGH (ref 70–99)
Glucose-Capillary: 134 mg/dL — ABNORMAL HIGH (ref 70–99)
Glucose-Capillary: 131 mg/dL — ABNORMAL HIGH (ref 70–99)
Glucose-Capillary: 164 mg/dL — ABNORMAL HIGH (ref 70–99)

## 2024-07-11 LAB — CBC
HCT: 35.5 % — ABNORMAL LOW (ref 39.0–52.0)
Hemoglobin: 11.9 g/dL — ABNORMAL LOW (ref 13.0–17.0)
MCH: 27.7 pg (ref 26.0–34.0)
MCHC: 33.5 g/dL (ref 30.0–36.0)
MCV: 82.8 fL (ref 80.0–100.0)
Platelets: 201 K/uL (ref 150–400)
RBC: 4.29 MIL/uL (ref 4.22–5.81)
RDW: 13.1 % (ref 11.5–15.5)
WBC: 9.4 K/uL (ref 4.0–10.5)
nRBC: 0 % (ref 0.0–0.2)

## 2024-07-11 MED ORDER — DEXTROSE IN LACTATED RINGERS 5 % IV SOLN
INTRAVENOUS | Status: AC
Start: 2024-07-11 — End: 2024-07-11

## 2024-07-11 MED ORDER — ZIPRASIDONE MESYLATE 20 MG IM SOLR
20.0000 mg | Freq: Once | INTRAMUSCULAR | Status: AC
  Administered 2024-07-11: 10 mg via INTRAMUSCULAR
  Filled 2024-07-11: qty 20

## 2024-07-11 MED ORDER — MIDAZOLAM HCL 2 MG/2ML IJ SOLN
INTRAMUSCULAR | Status: AC
  Administered 2024-07-11: 4 mg via INTRAVENOUS
  Filled 2024-07-11: qty 4

## 2024-07-11 MED ORDER — HALOPERIDOL LACTATE 5 MG/ML IJ SOLN
2.0000 mg | Freq: Four times a day (QID) | INTRAMUSCULAR | Status: DC | PRN
Start: 2024-07-11 — End: 2024-07-12
  Administered 2024-07-11 (×2): 2 mg via INTRAVENOUS
  Filled 2024-07-11 (×3): qty 1

## 2024-07-11 MED ORDER — INSULIN GLARGINE-YFGN 100 UNIT/ML ~~LOC~~ SOLN
10.0000 [IU] | Freq: Two times a day (BID) | SUBCUTANEOUS | Status: DC
  Administered 2024-07-11 – 2024-07-13 (×5): 10 [IU] via SUBCUTANEOUS
  Filled 2024-07-11 (×8): qty 0.1

## 2024-07-11 MED ORDER — MIDAZOLAM HCL 2 MG/2ML IJ SOLN
INTRAMUSCULAR | Status: AC
  Administered 2024-07-11: 2 mg via INTRAVENOUS
  Filled 2024-07-11: qty 2

## 2024-07-11 MED ORDER — MIDAZOLAM HCL (PF) 2 MG/2ML IJ SOLN: 4.0000 mg | Freq: Once | INTRAMUSCULAR | Status: AC

## 2024-07-11 MED ORDER — INSULIN GLARGINE-YFGN 100 UNIT/ML ~~LOC~~ SOLN
10.0000 [IU] | Freq: Two times a day (BID) | SUBCUTANEOUS | Status: DC
  Filled 2024-07-11: qty 0.1

## 2024-07-11 MED ORDER — INSULIN ASPART 100 UNIT/ML IJ SOLN
2.0000 [IU] | INTRAMUSCULAR | Status: DC
  Administered 2024-07-11: 2 [IU] via SUBCUTANEOUS
  Administered 2024-07-12 (×2): 4 [IU] via SUBCUTANEOUS
  Administered 2024-07-12: 2 [IU] via SUBCUTANEOUS
  Administered 2024-07-12 (×2): 4 [IU] via SUBCUTANEOUS
  Administered 2024-07-12 – 2024-07-13 (×2): 6 [IU] via SUBCUTANEOUS
  Administered 2024-07-13: 2 [IU] via SUBCUTANEOUS
  Administered 2024-07-13: 4 [IU] via SUBCUTANEOUS
  Filled 2024-07-11: qty 4
  Filled 2024-07-11: qty 2
  Filled 2024-07-11 (×2): qty 4
  Filled 2024-07-11: qty 6
  Filled 2024-07-11: qty 4
  Filled 2024-07-11: qty 6
  Filled 2024-07-11: qty 2
  Filled 2024-07-11: qty 4

## 2024-07-11 MED ORDER — POTASSIUM CHLORIDE 10 MEQ/100ML IV SOLN
10.0000 meq | INTRAVENOUS | Status: AC
  Administered 2024-07-11 – 2024-07-12 (×3): 10 meq via INTRAVENOUS
  Filled 2024-07-11 (×3): qty 100

## 2024-07-11 MED ORDER — MIDAZOLAM HCL (PF) 2 MG/2ML IJ SOLN: 2.0000 mg | Freq: Once | INTRAMUSCULAR | Status: AC

## 2024-07-11 MED ORDER — ORAL CARE MOUTH RINSE: 15.0000 mL | OROMUCOSAL | Status: DC | PRN

## 2024-07-11 MED ORDER — LACTATED RINGERS IV SOLN: INTRAVENOUS | Status: AC

## 2024-07-11 NOTE — Progress Notes (Signed)
 NAME:  Ryan Bartlett, MRN:  [id number], DOB:  05-Jul-1996, LOS: 2 ADMISSION DATE:  07/08/2024, CONSULTATION DATE:  07/10/24 REFERRING MD:  TRH, CHIEF COMPLAINT:  agitated encephalopathy   History of Present Illness:  28 yo male presented to [facility] 11/19 with complaints of nausea and vomiting. He was found to have hyperglycemic with elevated beta hydroxybutyric acid level.   Pt was recently admitted to [facility] for URI symptoms, along with n/v and was discharged quickly. He presented this evening from home via EMS with same symptoms along with abdominal pain. He had vomited numerous time since presentation. VSS were relatively stable with exception of tachycardia and tachypnea. Array of metabolic derrangements discovered. He was admitted started on insulin , metabolic issues attempted to be corrected. However this am pt became increasingly agitated, breaking his restraints, pulling iv's all while still having intermittent sonorous respirations with apenic periods c/w sleep apnea. Yesterday it appears there was arising concerns about signs/symptoms of withdrawal syndrome which has only worsened into this am.   Ccm was consulted for request for precedex  utilization  Pertinent  Medical History  Adha Bipolar d/o  Depression Oppositional defiant disorder Ptsd Schizoaffective d/o Marijuana use Tobacco use asthma  Significant Hospital Events: Including procedures, antibiotic start and stop dates in addition to other pertinent events   Admitted to [facility] 11/19 for n/v 11/21 in ICU for heavy sedation after agitation episode, still on insulin  gtt  Interim History / Subjective:  Today more awake coming off precedex . Remains on IVF, insulin  gtt, precedex .   Objective    Blood pressure 120/87, pulse (!) 57, temperature 98.4 F (36.9 C), temperature source Axillary, resp. rate 18, height 5' 9 (1.753 m), weight 101 kg, SpO2 100%.        Intake/Output Summary (Last 24 hours) at 07/11/2024 1156 Last  data filed at 07/11/2024 1000 Gross per 24 hour  Intake 6304.62 ml  Output 1090 ml  Net 5214.62 ml   Filed Weights   07/11/24 1029  Weight: 101 kg    Examination: General: ill appearing young man lying in bed sleeping, lightly sedated on precedex  HENT: /AT, eyes anicteric Lungs: snoring some, no apneas. CTAB Cardiovascular: S1S2, RRR Abdomen: obese, soft, NT Extremities: no cyanosis or edema, in restraints Neuro: arouses to verbal stimulation, interacts mostly with his mother. Answering some questions  BG 130-160s Na+ 134 Bicarb 20, AG 12 BUN 47 Cr 2.03 WBC 9.4 H.H 11.9/35.5 Platelets 201    Resolved problem list   Assessment and Plan  Acute toxic encephalopathy Baseline schizoaffective disorder & bipolar disorder Suspected acute alcohol withdrawal syndrome Depression Illicit substance use (amphetamines, MJ, benzos) - Home meds: aristada , next dose due 11/25 - discussed with [facility], they will release the medication to family and they will bring in for continued compliance next week.   -weaning off precedex - failed due to agitation. May need more short-acting APs. Gets long acting meds, which have not lapsed yet. - Phenobarb IV taper  -add haldol  2mg  IV PRN for agitation -thiamine  -mother at bedside today, which helps -verbally redirect as able -trial coming out of restraints  DKA, resolved Known DM-- mom reports the metformin  was for psych symptom control, that he was not formally known to be diabetic and he was not regularly following with his PCP - Hold PTA metformin   -can transition to basal bolus insulin  if AG remains closed -Needs to be discharged on insulin  based on A1c. His mother does not think he will be compliant with this  and is on the fence of whether he would understand the consequences of not controlling his DM. She reports that he has had capacity in the past but mentioned scenarios where this was questioned by the outpatient team  helping care for him. He has made poor decisions about letting people into his house and doing drugs. His mother is considering sending him to a group home, but thinks he will likely leave. She has not decided yet. She worries about his long-term ability to manage his DM.  -I warned his mother that when/ if he starts controlling his DM, he will likely gain weight.   AKI, likely due to prolonged prerenal state with DKA. No rhabdo. - con't IVF; can orally hydrate once awake enough -strict I/O -con't foley for now; once he is more awake and less likely to retain, then can trial removal  Hyponatremia; corrected quickly on D5-LR - would like to increase FW, recheck Na+ now  Anemia; previously hemoconcentrated from DKA -no current indication for transfusion  Hypertension -hydralazine  PRN  Labs   CBC: Recent Labs  Lab 07/08/24 1515 07/08/24 1526 07/08/24 2013 07/09/24 0440 07/10/24 0638 07/11/24 0405  WBC 16.4*  --   --  14.9* 10.8* 9.4  NEUTROABS 12.9*  --   --   --   --   --   HGB 17.9* 19.4*  18.4* 18.7* 17.3* 14.3 11.9*  HCT 48.7 57.0*  54.0* 55.0* 47.0 40.5 35.5*  MCV 77.4*  --   --  77.0* 79.9* 82.8  PLT 336  --   --  311 265 201    Basic Metabolic Panel: Recent Labs  Lab 07/10/24 1128 07/10/24 1459 07/10/24 1838 07/10/24 2223 07/11/24 0405  NA 131* 131* 128* 131* 134*  K 5.1 4.5 4.4 4.1 4.0  CL 98 100 97* 100 102  CO2 22 21* 20* 19* 20*  GLUCOSE 189* 123* 158* 157* 148*  BUN 46* 50* 50* 50* 47*  CREATININE 2.40* 2.61* 2.36* 2.18* 2.03*  CALCIUM  7.9* 7.8* 7.8* 7.6* 7.7*   GFR: Estimated Creatinine Clearance: 62.3 mL/min (A) (by C-G formula based on SCr of 2.03 mg/dL (H)). Recent Labs  Lab 07/08/24 1515 07/09/24 0440 07/10/24 0638 07/11/24 0405  WBC 16.4* 14.9* 10.8* 9.4    Liver Function Tests: Recent Labs  Lab 07/10/24 0638 07/11/24 0405  AST 31 25  ALT 32 24  ALKPHOS 59 51  BILITOT 1.2 1.1  PROT 6.3* 6.1*  ALBUMIN 3.0* 2.6*   Critical care  time:     This patient is critically ill with multiple organ system failure which requires frequent high complexity decision making, assessment, support, evaluation, and titration of therapies. This was completed through the application of advanced monitoring technologies and extensive interpretation of multiple databases. During this encounter critical care time was devoted to patient care services described in this note for 34 minutes.  Leita SHAUNNA Gaskins, DO 07/11/24 4:11 PM [facility]  For contact information, see Amion. If no response to pager, please call PCCM consult pager. After hours, 7PM- 7AM, please call Elink.

## 2024-07-11 NOTE — Plan of Care (Signed)
   Problem: Clinical Measurements: Goal: Ability to maintain clinical measurements within normal limits will improve Outcome: Progressing Goal: Will remain free from infection Outcome: Progressing Goal: Diagnostic test results will improve Outcome: Progressing Goal: Respiratory complications will improve Outcome: Progressing Goal: Cardiovascular complication will be avoided Outcome: Progressing   Problem: Activity: Goal: Risk for activity intolerance will decrease Outcome: Progressing   Problem: Coping: Goal: Level of anxiety will decrease Outcome: Progressing   Problem: Elimination: Goal: Will not experience complications related to bowel motility Outcome: Progressing Goal: Will not experience complications related to urinary retention Outcome: Progressing   Problem: Pain Managment: Goal: General experience of comfort will improve and/or be controlled Outcome: Progressing   Problem: Safety: Goal: Ability to remain free from injury will improve Outcome: Progressing   Problem: Skin Integrity: Goal: Risk for impaired skin integrity will decrease Outcome: Progressing

## 2024-07-11 NOTE — Progress Notes (Addendum)
 Interim CCM Progress Note  General: acute agitated young adult male, lying in icu, yelling/screaming HEENT: Normocephalic, PERRLA intact, teeth intact, pink MM CV: s1,s2, RRR- SR Heart 90s-low 100s,, no MRG, No JVD  pulm: no distress, O2 Sat 98% RA, protecting airway Abs: bs active, soft  Extremities: moves all extremities, strong, no deficits Skin: no rash  Neuro: Rass 4, extremely agitated, only intermittently redirectable to Mother's voice/commands GU: foley intact   Severe Agitation  Illicit Substance Use- Positive for Amphetamines, Benzos suspect withdrawal along with possible ETOH withdrawal  Acute metabolic Encephalopathy in setting of above  Suspect this withdrawal, since DKA has been resolved High risk for Intubation  Called by Elink for STAT assessment of patient due to severe agitation refractory to precedex .  Upon arrival, patient had received ativan  1mg , haldol  2mg , and 2mg  of versed .  Despite, safety cuffs on wrist and ankle. Patient able to break free, and almost injuring self and staff.  Immediately ordered additional 4mg  of versed  STAT.  Discussed with mother at beside that if patient loses ability to protect airway, will have to place on life support-mechanical ventilation. Mother stated that patient does not do well with IV Ativan - has increased anxiety/agitation in past.  P: After 4mg  of versed , patient still agitated.  Ordered to increase precedex  to 2.0 mcg, wean as tolerated  Ordered 20mg  of Geodon  IM at first, decided to give 10mg  as patient began to have periods of less agitation Ordered STAT ETCO2 to be placed Continuous O2 Sats monitoring  Currently patient able to protect airway- Have discussed with Mother at beside of the potential of patient needing mechanical ventilation.  Continue Phenobarb taper  DC ativan  with patient's possible drug intolerance with  Violent restraints ordered by Elink but patient did not need, safety cuffs on wrist, ankles, with  posey belt sufficient at this time. Monitor for EKG changes, Monitor QTC -Currently patient in NSR- Measured QTC- 0.44s Obtain EKG in AM   CC: 30 mins   Christian Daton Szilagyi AGACNP-BC   [facility] 07/11/2024, 11:55 PM  Please see [email or web address] for pager details.  From 7A-7P if no response, please call [phone]. After hours, please call ELink [phone].

## 2024-07-11 NOTE — Progress Notes (Addendum)
 eLink Physician-Brief Progress Note Patient Name: Ryan Bartlett DOB: 12-29-1995 MRN: [id number]   Date of Service  07/11/2024  HPI/Events of Note  Remains persistently agitated requiring the use of restraints for safety  eICU Interventions  Add ankle restraints   2235 -had another episode of severe violence and kicking/punching, needed to escalate to violent restraints.  Versed  2 mg x 1 in addition to the scheduled phenobarbital .     Intervention Category Minor Interventions: Agitation / anxiety - evaluation and management  Dominico Rod 07/11/2024, 9:14 PM

## 2024-07-12 LAB — CBC
HCT: 34.3 % — ABNORMAL LOW (ref 39.0–52.0)
Hemoglobin: 11.4 g/dL — ABNORMAL LOW (ref 13.0–17.0)
MCH: 28 pg (ref 26.0–34.0)
MCHC: 33.2 g/dL (ref 30.0–36.0)
MCV: 84.3 fL (ref 80.0–100.0)
Platelets: 222 K/uL (ref 150–400)
RBC: 4.07 MIL/uL — ABNORMAL LOW (ref 4.22–5.81)
RDW: 13.1 % (ref 11.5–15.5)
WBC: 7.8 K/uL (ref 4.0–10.5)
nRBC: 0 % (ref 0.0–0.2)

## 2024-07-12 LAB — COMPREHENSIVE METABOLIC PANEL WITH GFR
ALT: 26 U/L (ref 0–44)
AST: 29 U/L (ref 15–41)
Albumin: 2.6 g/dL — ABNORMAL LOW (ref 3.5–5.0)
Alkaline Phosphatase: 57 U/L (ref 38–126)
Anion gap: 10 (ref 5–15)
BUN: 26 mg/dL — ABNORMAL HIGH (ref 6–20)
CO2: 21 mmol/L — ABNORMAL LOW (ref 22–32)
Calcium: 7.7 mg/dL — ABNORMAL LOW (ref 8.9–10.3)
Chloride: 106 mmol/L (ref 98–111)
Creatinine, Ser: 1.35 mg/dL — ABNORMAL HIGH (ref 0.61–1.24)
GFR, Estimated: >60 mL/min (ref >60)
Glucose, Bld: 164 mg/dL — ABNORMAL HIGH (ref 70–99)
Potassium: 4.3 mmol/L (ref 3.5–5.1)
Sodium: 137 mmol/L (ref 135–145)
Total Bilirubin: 0.7 mg/dL (ref 0.0–1.2)
Total Protein: 5.5 g/dL — ABNORMAL LOW (ref 6.5–8.1)

## 2024-07-12 LAB — BASIC METABOLIC PANEL WITH GFR
Anion gap: 15 (ref 5–15)
Anion gap: 7 (ref 5–15)
BUN: 25 mg/dL — ABNORMAL HIGH (ref 6–20)
BUN: 21 mg/dL — ABNORMAL HIGH (ref 6–20)
CO2: 21 mmol/L — ABNORMAL LOW (ref 22–32)
CO2: 26 mmol/L (ref 22–32)
Calcium: 8 mg/dL — ABNORMAL LOW (ref 8.9–10.3)
Calcium: 8 mg/dL — ABNORMAL LOW (ref 8.9–10.3)
Chloride: 105 mmol/L (ref 98–111)
Chloride: 102 mmol/L (ref 98–111)
Creatinine, Ser: 1.22 mg/dL (ref 0.61–1.24)
Creatinine, Ser: 1.45 mg/dL — ABNORMAL HIGH (ref 0.61–1.24)
GFR, Estimated: >60 mL/min (ref >60)
GFR, Estimated: >60 mL/min (ref >60)
Glucose, Bld: 139 mg/dL — ABNORMAL HIGH (ref 70–99)
Glucose, Bld: 210 mg/dL — ABNORMAL HIGH (ref 70–99)
Potassium: 4.3 mmol/L (ref 3.5–5.1)
Potassium: 4 mmol/L (ref 3.5–5.1)
Sodium: 141 mmol/L (ref 135–145)
Sodium: 135 mmol/L (ref 135–145)

## 2024-07-12 LAB — GLUCOSE, CAPILLARY
Glucose-Capillary: 136 mg/dL — ABNORMAL HIGH (ref 70–99)
Glucose-Capillary: 151 mg/dL — ABNORMAL HIGH (ref 70–99)
Glucose-Capillary: 167 mg/dL — ABNORMAL HIGH (ref 70–99)
Glucose-Capillary: 198 mg/dL — ABNORMAL HIGH (ref 70–99)
Glucose-Capillary: 202 mg/dL — ABNORMAL HIGH (ref 70–99)
Glucose-Capillary: 215 mg/dL — ABNORMAL HIGH (ref 70–99)

## 2024-07-12 MED ORDER — OLANZAPINE 5 MG PO TBDP
5.0000 mg | ORAL_TABLET | Freq: Two times a day (BID) | ORAL | Status: DC
  Administered 2024-07-12 (×2): 5 mg via ORAL
  Filled 2024-07-12 (×3): qty 1

## 2024-07-12 MED ORDER — TRAZODONE HCL 50 MG PO TABS
50.0000 mg | ORAL_TABLET | Freq: Every day | ORAL | Status: DC
  Administered 2024-07-12 – 2024-07-14 (×3): 50 mg via ORAL
  Filled 2024-07-12 (×3): qty 1

## 2024-07-12 MED ORDER — MELATONIN 3 MG PO TABS
3.0000 mg | ORAL_TABLET | Freq: Every day | ORAL | Status: DC
  Administered 2024-07-12 – 2024-07-14 (×3): 3 mg via ORAL
  Filled 2024-07-12 (×3): qty 1

## 2024-07-12 MED ORDER — OLANZAPINE 5 MG PO TABS
5.0000 mg | ORAL_TABLET | Freq: Two times a day (BID) | ORAL | Status: DC
  Filled 2024-07-12 (×2): qty 1

## 2024-07-12 MED ORDER — ZIPRASIDONE MESYLATE 20 MG IM SOLR: 10.0000 mg | INTRAMUSCULAR | Status: DC | PRN

## 2024-07-12 MED ORDER — CLONAZEPAM 1 MG PO TABS: 2.0000 mg | ORAL_TABLET | Freq: Two times a day (BID) | ORAL | Status: DC

## 2024-07-12 NOTE — Progress Notes (Signed)
 NAME:  LOGUN COLAVITO, MRN:  [id number], DOB:  1995-12-11, LOS: 3 ADMISSION DATE:  07/08/2024, CONSULTATION DATE:  07/10/24 REFERRING MD:  TRH, CHIEF COMPLAINT:  agitated encephalopathy   History of Present Illness:  28 yo male presented to [facility] 11/19 with complaints of nausea and vomiting. He was found to have hyperglycemic with elevated beta hydroxybutyric acid level.   Pt was recently admitted to [facility] for URI symptoms, along with n/v and was discharged quickly. He presented this evening from home via EMS with same symptoms along with abdominal pain. He had vomited numerous time since presentation. VSS were relatively stable with exception of tachycardia and tachypnea. Array of metabolic derrangements discovered. He was admitted started on insulin , metabolic issues attempted to be corrected. However this am pt became increasingly agitated, breaking his restraints, pulling iv's all while still having intermittent sonorous respirations with apenic periods c/w sleep apnea. Yesterday it appears there was arising concerns about signs/symptoms of withdrawal syndrome which has only worsened into this am.   Ccm was consulted for request for precedex  utilization  Pertinent  Medical History  Adha Bipolar d/o  Depression Oppositional defiant disorder Ptsd Schizoaffective d/o Marijuana use Tobacco use asthma  Significant Hospital Events: Including procedures, antibiotic start and stop dates in addition to other pertinent events   Admitted to [facility] 11/19 for n/v 11/21 in ICU for heavy sedation after agitation episode, still on insulin  gtt  Interim History / Subjective:  Overnight very agitated, back on higher dose precedex  and fairly sedated after multiple IV doses of meds. Mom had left before this happened and came back; she plans on staying until his back to his baseline mental status. She wants to avoid benzos and reports zyprexa  has worked well for him in the past. He is heavily motivated by  food.   Objective    Blood pressure 109/66, pulse (!) 58, temperature 99.1 F (37.3 C), temperature source Axillary, resp. rate 19, height 5' 9 (1.753 m), weight 101 kg, SpO2 98%.        Intake/Output Summary (Last 24 hours) at 07/12/2024 0846 Last data filed at 07/12/2024 0800 Gross per 24 hour  Intake 2917.65 ml  Output 2720 ml  Net 197.65 ml   Filed Weights   07/11/24 1029  Weight: 101 kg    Examination: General: lethargic young man lying in bed snoring, wakes up with stimulation HENT:  [place]/AT, eyes anicteric, ETCO2 cannula Lungs: snoring but no apneas, CTAB Cardiovascular: S1S2, RRR Abdomen: obese, soft, NT Extremities: in 4-pt restraints, pulls against them as soon as he wakes up Neuro: arouses to stimulation, moving all extremities Psych: More interactive with his mom. Will answer questions for her. Saying a few short words, mostly about wanting to eat.  BG 120-150s Na+ 134 Bicarb 20, AG 12 BUN 47 Cr 2.03 WBC 9.4 H.H 11.9/35.5 Platelets 201    Resolved problem list   Assessment and Plan  Acute toxic encephalopathy Baseline schizoaffective disorder & bipolar disorder Suspected acute alcohol withdrawal syndrome Depression Illicit substance use (amphetamines, MJ, benzos) -Con't home meds- Aristada  to be obtained by his mom from his OP clinic. They can give it to pharmacy staff and administer here so he does not lapse on his dose -adding scheduled olanzapine ; mom wants to hold off on clonazepam  -if still unable to tolerate coming off precedex , can add clonidine -con't phenobarbital  taper -thiamine  -Ok to advance diet and watch mental status carefully. Can motive with food to hopefully earn trust, get therapeutic compliance with  treatments. Start with soft foods given concern over level of alertness  -his mother being at bedside is helping -Working on coming out of restraints if he can be awake and stay calm. We have not been able to achieve that yet. -May  need to consult inpatient psychiatry tomorrow if he remains uncontrolled/ requiring IV meds.  DKA, resolved Known DM-- mom reports the metformin  was for psych symptom control, that he was not formally known to be diabetic and he was not regularly following with his PCP - con't holding PTA metformin   -con't glargine 10 units BID + SSI -may need mealtime insulin  later if he is getting enough food in. -Needs to be discharged on insulin  based on A1c. Not sure if he can get a GLP-1, but this could help with his obesity. During previous discussions his mother does not think he will be compliant with this and is on the fence of whether he would understand the consequences of not controlling his DM. She reports that he has had capacity in the past but mentioned scenarios where this was questioned by the outpatient team helping care for him. He has made poor decisions about letting people into his house and doing drugs. His mother is considering sending him to a group home, but thinks he will likely leave. She has not decided yet. She worries about his long-term ability to manage his DM.  -I have warned his mother that when/ if he starts controlling his DM, he will likely gain weight.   AKI-- improved; likely due to prolonged prerenal state with DKA. No rhabdo. - can stop IVF if now drinking -strict I/O -renally dose meds, avoid nephrotoxic meds -take out foley when off precedex ; has had issues with retention. If he is pulling on it, may be safer to do periodic straight caths in his case  Hyponatremia; corrected quickly on D5-LR -encourage adequate PO intake  Anemia; previously hemoconcentrated from DKA -no indication for transfusions currently  Hypertension -PRN hydralazine   Long discussion with his mother and RN today- trying to avoid benzos. She thinks we will make a lot of progress by using food as tool/ reward. Think trying to let him eat is going to be better than trying to get an NGT in him,  and he hasn't been awake enough to eat the last 2 days. Ok to use TV as a way to keep him awake/ engaged as well. Main goal is out of restraints and off precedex . She understands we may still be more sedated than what is ideal, but then we can back off on meds once he is controlled off the precedex .   Labs   CBC: Recent Labs  Lab 07/08/24 1515 07/08/24 1526 07/08/24 2013 07/09/24 0440 07/10/24 0638 07/11/24 0405 07/12/24 0412  WBC 16.4*  --   --  14.9* 10.8* 9.4 7.8  NEUTROABS 12.9*  --   --   --   --   --   --   HGB 17.9*   < > 18.7* 17.3* 14.3 11.9* 11.4*  HCT 48.7   < > 55.0* 47.0 40.5 35.5* 34.3*  MCV 77.4*  --   --  77.0* 79.9* 82.8 84.3  PLT 336  --   --  311 265 201 222   < > = values in this interval not displayed.    Basic Metabolic Panel: Recent Labs  Lab 07/10/24 2223 07/11/24 0405 07/11/24 1534 07/11/24 2146 07/12/24 0412  NA 131* 134* 134* 138 137  K 4.1 4.0  3.7 3.6 4.3  CL 100 102 101 102 106  CO2 19* 20* 24 21* 21*  GLUCOSE 157* 148* 123* 128* 164*  BUN 50* 47* 37* 32* 26*  CREATININE 2.18* 2.03* 1.71* 1.52* 1.35*  CALCIUM  7.6* 7.7* 8.0* 7.9* 7.7*   GFR: Estimated Creatinine Clearance: 93.7 mL/min (A) (by C-G formula based on SCr of 1.35 mg/dL (H)). Recent Labs  Lab 07/09/24 0440 07/10/24 0638 07/11/24 0405 07/12/24 0412  WBC 14.9* 10.8* 9.4 7.8    Liver Function Tests: Recent Labs  Lab 07/10/24 0638 07/11/24 0405 07/12/24 0412  AST 31 25 29   ALT 32 24 26  ALKPHOS 59 51 57  BILITOT 1.2 1.1 0.7  PROT 6.3* 6.1* 5.5*  ALBUMIN 3.0* 2.6* 2.6*   Critical care time:     This patient is critically ill with multiple organ system failure which requires frequent high complexity decision making, assessment, support, evaluation, and titration of therapies. This was completed through the application of advanced monitoring technologies and extensive interpretation of multiple databases. During this encounter critical care time was devoted to patient  care services described in this note for 40 minutes.  Leita SHAUNNA Gaskins, DO 07/12/24 8:46 AM [facility]  For contact information, see Amion. If no response to pager, please call PCCM consult pager. After hours, 7PM- 7AM, please call Elink.

## 2024-07-13 ENCOUNTER — Telehealth (HOSPITAL_COMMUNITY): Payer: Self-pay

## 2024-07-13 ENCOUNTER — Other Ambulatory Visit (HOSPITAL_COMMUNITY): Payer: Self-pay

## 2024-07-13 DIAGNOSIS — E119 Type 2 diabetes mellitus without complications: Secondary | ICD-10-CM

## 2024-07-13 LAB — GLUCOSE, CAPILLARY
Glucose-Capillary: 172 mg/dL — ABNORMAL HIGH (ref 70–99)
Glucose-Capillary: 140 mg/dL — ABNORMAL HIGH (ref 70–99)
Glucose-Capillary: 295 mg/dL — ABNORMAL HIGH (ref 70–99)
Glucose-Capillary: 248 mg/dL — ABNORMAL HIGH (ref 70–99)
Glucose-Capillary: 324 mg/dL — ABNORMAL HIGH (ref 70–99)

## 2024-07-13 LAB — BASIC METABOLIC PANEL WITH GFR
Anion gap: 10 (ref 5–15)
BUN: 14 mg/dL (ref 6–20)
CO2: 23 mmol/L (ref 22–32)
Calcium: 8.1 mg/dL — ABNORMAL LOW (ref 8.9–10.3)
Chloride: 102 mmol/L (ref 98–111)
Creatinine, Ser: 1.23 mg/dL (ref 0.61–1.24)
GFR, Estimated: >60 mL/min (ref >60)
Glucose, Bld: 177 mg/dL — ABNORMAL HIGH (ref 70–99)
Potassium: 3.9 mmol/L (ref 3.5–5.1)
Sodium: 135 mmol/L (ref 135–145)

## 2024-07-13 MED ORDER — PANTOPRAZOLE SODIUM 40 MG PO TBEC
40.0000 mg | DELAYED_RELEASE_TABLET | Freq: Every day | ORAL | Status: DC
  Administered 2024-07-14 – 2024-07-15 (×2): 40 mg via ORAL
  Filled 2024-07-13 (×2): qty 1

## 2024-07-13 MED ORDER — ACETAMINOPHEN 325 MG PO TABS: 650.0000 mg | ORAL_TABLET | Freq: Four times a day (QID) | ORAL | Status: DC | PRN

## 2024-07-13 MED ORDER — INSULIN ASPART 100 UNIT/ML IJ SOLN
0.0000 [IU] | Freq: Every day | INTRAMUSCULAR | Status: DC
  Administered 2024-07-13: 4 [IU] via SUBCUTANEOUS
  Filled 2024-07-13: qty 4

## 2024-07-13 MED ORDER — OLANZAPINE 5 MG PO TBDP: 5.0000 mg | ORAL_TABLET | Freq: Every day | ORAL | Status: DC

## 2024-07-13 MED ORDER — OXYCODONE HCL 5 MG PO TABS
5.0000 mg | ORAL_TABLET | ORAL | Status: DC | PRN
  Administered 2024-07-13: 5 mg via ORAL
  Filled 2024-07-13: qty 1

## 2024-07-13 MED ORDER — OLANZAPINE 5 MG PO TBDP
10.0000 mg | ORAL_TABLET | Freq: Every day | ORAL | Status: DC
  Administered 2024-07-13 – 2024-07-14 (×2): 10 mg via ORAL
  Filled 2024-07-13 (×3): qty 2

## 2024-07-13 MED ORDER — OLANZAPINE 5 MG PO TBDP
5.0000 mg | ORAL_TABLET | Freq: Every day | ORAL | Status: DC
  Administered 2024-07-13 – 2024-07-15 (×3): 5 mg via ORAL
  Filled 2024-07-13 (×3): qty 1

## 2024-07-13 MED ORDER — INSULIN ASPART 100 UNIT/ML IJ SOLN: 2.0000 [IU] | Freq: Three times a day (TID) | INTRAMUSCULAR | Status: DC

## 2024-07-13 MED ORDER — INSULIN ASPART 100 UNIT/ML IJ SOLN
0.0000 [IU] | Freq: Three times a day (TID) | INTRAMUSCULAR | Status: DC
  Administered 2024-07-13: 5 [IU] via SUBCUTANEOUS
  Administered 2024-07-13: 8 [IU] via SUBCUTANEOUS
  Administered 2024-07-14 (×2): 11 [IU] via SUBCUTANEOUS
  Administered 2024-07-14: 8 [IU] via SUBCUTANEOUS
  Administered 2024-07-15: 15 [IU] via SUBCUTANEOUS
  Administered 2024-07-15: 3 [IU] via SUBCUTANEOUS
  Filled 2024-07-13: qty 8
  Filled 2024-07-13: qty 11
  Filled 2024-07-13: qty 15
  Filled 2024-07-13: qty 8
  Filled 2024-07-13: qty 11
  Filled 2024-07-13 (×2): qty 2
  Filled 2024-07-13: qty 11
  Filled 2024-07-13: qty 5

## 2024-07-13 NOTE — Inpatient Diabetes Management (Signed)
 Inpatient Diabetes Program Recommendations  AACE/ADA: New Consensus Statement on Inpatient Glycemic Control (2015)  Target Ranges:  Prepandial:   less than 140 mg/dL      Peak postprandial:   less than 180 mg/dL (1-2 hours)      Critically ill patients:  140 - 180 mg/dL   Lab Results  Component Value Date   GLUCAP 140 (H) 07/13/2024   HGBA1C 12.1 (H) 07/09/2024    Review of Glycemic Control  Latest Reference Range & Units 07/12/24 12:12 07/12/24 16:28 07/12/24 20:12 07/12/24 23:51 07/13/24 03:46 07/13/24 07:39  Glucose-Capillary 70 - 99 mg/dL 848 (H) 784 (H) 801 (H) 202 (H) 172 (H) 140 (H)   Diabetes history: New DM Outpatient Diabetes medications:  Metformin  500 mg bid Current orders for Inpatient glycemic control:  Novolog  0-15 units tid with meals and HS Semglee  10 units bid  Inpatient Diabetes Program Recommendations:    Note new diagnosis of DM.  May consider increasing Semglee  to 12 units bid? Patient still not appropriate for education.   Thanks,  Randall Bullocks, RN, BC-ADM Inpatient Diabetes Coordinator Pager [phone]  (8a-5p)

## 2024-07-13 NOTE — Progress Notes (Signed)
   NAME:  Ryan Bartlett, MRN:  [id number], DOB:  [birth year], LOS: 4 ADMISSION DATE:  07/08/2024, CONSULTATION DATE:  07/10/24 REFERRING MD:  TRH, CHIEF COMPLAINT:  agitated encephalopathy   History of Present Illness:  28 yo male presented to [facility] 11/19 with complaints of nausea and vomiting. He was found to have hyperglycemic with elevated beta hydroxybutyric acid level.   Pt was recently admitted to [facility] for URI symptoms, along with n/v and was discharged quickly. He presented this evening from home via EMS with same symptoms along with abdominal pain. He had vomited numerous time since presentation. VSS were relatively stable with exception of tachycardia and tachypnea. Array of metabolic derrangements discovered. He was admitted started on insulin , metabolic issues attempted to be corrected. However this am pt became increasingly agitated, breaking his restraints, pulling iv's all while still having intermittent sonorous respirations with apenic periods c/w sleep apnea. Yesterday it appears there was arising concerns about signs/symptoms of withdrawal syndrome which has only worsened into this am.   Ccm was consulted for request for precedex  utilization  Pertinent  Medical History  Adha Bipolar d/o  Depression Oppositional defiant disorder Ptsd Schizoaffective d/o Marijuana use Tobacco use asthma  Significant Hospital Events: Including procedures, antibiotic start and stop dates in addition to other pertinent events   Admitted to [facility] 11/19 for n/v 11/21 in ICU for heavy sedation after agitation episode, still on insulin  gtt  Interim History / Subjective:  Still a bit agitated on precedex . Overall improved. Mom at bedside.  Objective    Blood pressure (!) 120/56, pulse (!) 104, temperature 99.2 F (37.3 C), temperature source Axillary, resp. rate (!) 24, height 5' 9 (1.753 m), weight 101 kg, SpO2 100%.        Intake/Output Summary (Last 24 hours) at 07/13/2024 9192 Last  data filed at 07/13/2024 0600 Gross per 24 hour  Intake 385.29 ml  Output 4775 ml  Net -4389.71 ml   Filed Weights   07/11/24 1029  Weight: 101 kg    Examination: No distress Cheyne-Stokes breathing pattern Moves to command with enough stimulation Per RN when wakes up he is oriented but this waxes and wanes Ext warm Abd soft  BMP ok  Resolved problem list   Assessment and Plan  Acute toxic encephalopathy Baseline schizoaffective disorder & bipolar disorder Suspected acute alcohol withdrawal syndrome Depression Illicit substance use (amphetamines, MJ, benzos) DKA, resolved Known DM-- mom reports the metformin  was for psych symptom control, that he was not formally known to be diabetic and he was not regularly following with his PCP AKI- resolved Anemia- check AM iron panel Hypertension  - Increase at bedtime zyprexa  - Avoid benzos - On a phenobarb taper for ?etoh w/d but seems more like we are using for refractory psychosis - Wean precedex ; use clonidine if unable to wean - Needs to mobilize - Basal bolus insulin  - CBG to ACHS - Substance abuse avoidance counseling when able - Needs to get his aristada : mom going to try to get from home - AM EKG Qtc check - DC heparin , PT/OT - DC tele, BP checks to q4h  33 min cc time (precedex  wean) Rolan Sharps MD PCCM

## 2024-07-13 NOTE — Evaluation (Signed)
 Occupational Therapy Evaluation Patient Details Name: Ryan Bartlett MRN: [id number] DOB: 1995-11-10 Today's Date: 07/13/2024   History of Present Illness   Ryan Bartlett is a 28 y.o. male admitted 11/19 with n/v found to be in DKA.  11/21 in ICU for heavy sedation after agitation episode. PMH: ADHD, bipolar disorder, depression, oppositional defiant disorder, PTSD, schizoaffective disorder, marijuana and tobacco abuse, arthritis, asthma.     Clinical Impressions Pt ind at baseline with ADLs and functional mobility, lives alone but could have ?assist from family at d/c. Pt currently needs set up - mod A for ADLs, CGA for bed mobility, and min +2 for transfers with 2 person HHA. Pt with impaired cognition oriented to self and has overall decr safety awareness/awareness of deficits. Pt presenting with impairments listed below, will follow acutely. Patient will benefit from intensive inpatient follow-up therapy, >3 hours/day to maximize safety/ind with ADL/functional mobility.      If plan is discharge home, recommend the following:   A lot of help with walking and/or transfers;A lot of help with bathing/dressing/bathroom;Assistance with cooking/housework;Direct supervision/assist for medications management;Direct supervision/assist for financial management;Help with stairs or ramp for entrance;Assist for transportation     Functional Status Assessment   Patient has had a recent decline in their functional status and demonstrates the ability to make significant improvements in function in a reasonable and predictable amount of time.     Equipment Recommendations   Tub/shower seat     Recommendations for Other Services   PT consult;Speech consult;Rehab consult     Precautions/Restrictions   Precautions Precautions: Fall Restrictions Weight Bearing Restrictions Per Provider Order: No     Mobility Bed Mobility Overal bed mobility: Needs Assistance Bed Mobility: Sit  to Supine       Sit to supine: Contact guard assist        Transfers Overall transfer level: Needs assistance Equipment used: 2 person hand held assist Transfers: Sit to/from Stand Sit to Stand: Min assist, +2 physical assistance                  Balance Overall balance assessment: Needs assistance Sitting-balance support: No upper extremity supported, Feet supported Sitting balance-Leahy Scale: Fair     Standing balance support: Bilateral upper extremity supported, During functional activity Standing balance-Leahy Scale: Poor Standing balance comment: reliant on external support                           ADL either performed or assessed with clinical judgement   ADL Overall ADL's : Needs assistance/impaired Eating/Feeding: Set up   Grooming: Set up   Upper Body Bathing: Moderate assistance   Lower Body Bathing: Moderate assistance   Upper Body Dressing : Minimal assistance   Lower Body Dressing: Moderate assistance   Toilet Transfer: Minimal assistance   Toileting- Clothing Manipulation and Hygiene: Minimal assistance       Functional mobility during ADLs: Minimal assistance       Vision Patient Visual Report: Blurring of vision Additional Comments: not formally assessed, noted undershooting with grooming task at sink, per pt's family member, pt is supposed to have glasses but does not wear them     Perception Perception: Not tested       Praxis Praxis: Not tested       Pertinent Vitals/Pain Pain Assessment Pain Assessment: Faces Pain Score: 4  Faces Pain Scale: Hurts little more Pain Location: R knee Pain Descriptors / Indicators: Discomfort Pain  Intervention(s): Limited activity within patient's tolerance, Monitored during session, Repositioned     Extremity/Trunk Assessment Upper Extremity Assessment Upper Extremity Assessment: Generalized weakness   Lower Extremity Assessment Lower Extremity Assessment: Defer to PT  evaluation   Cervical / Trunk Assessment Cervical / Trunk Assessment: Normal   Communication Communication Communication: No apparent difficulties   Cognition Arousal: Alert Behavior During Therapy: Impulsive Cognition: Cognition impaired   Orientation impairments: Situation, Time, Place (states he is at [facility], unaware of date/month) Awareness: Online awareness impaired, Intellectual awareness impaired (intermittent realization that he is ambulating more unsteadily than his baseline) Memory impairment (select all impairments): Short-term memory, Working memory Attention impairment (select first level of impairment): Sustained attention Executive functioning impairment (select all impairments): Problem solving, Reasoning OT - Cognition Comments: unable to do simple math task, incr time to count backward 20-1                 Following commands: Impaired Following commands impaired: Follows one step commands with increased time     Cueing  General Comments   Cueing Techniques: Verbal cues;Gestural cues  VSS on RA   Exercises     Shoulder Instructions      Home Living Family/patient expects to be discharged to:: Private residence Living Arrangements: Alone Available Help at Discharge: Family;Friend(s);Available PRN/intermittently Type of Home: Apartment Home Access: Stairs to enter Entrance Stairs-Number of Steps: 3 Entrance Stairs-Rails: Left Home Layout: One level     Bathroom Shower/Tub: Chief Strategy Officer: Standard     Home Equipment: None          Prior Functioning/Environment Prior Level of Function : Independent/Modified Independent             Mobility Comments: doesn't work or drive, no AD, plays on computer and video games ADLs Comments: indep    OT Problem List: Decreased strength;Decreased range of motion;Decreased activity tolerance;Impaired balance (sitting and/or standing);Decreased  coordination;Impaired vision/perception;Decreased safety awareness   OT Treatment/Interventions: Self-care/ADL training;Therapeutic exercise;Energy conservation;DME and/or AE instruction;Therapeutic activities;Patient/family education;Balance training;Cognitive remediation/compensation      OT Goals(Current goals can be found in the care plan section)   Acute Rehab OT Goals Patient Stated Goal: none stated OT Goal Formulation: With patient Time For Goal Achievement: 07/27/24 Potential to Achieve Goals: Good   OT Frequency:  Min 2X/week    Co-evaluation              AM-PAC OT 6 Clicks Daily Activity     Outcome Measure Help from another person eating meals?: A Little Help from another person taking care of personal grooming?: A Little Help from another person toileting, which includes using toliet, bedpan, or urinal?: A Little Help from another person bathing (including washing, rinsing, drying)?: A Lot Help from another person to put on and taking off regular upper body clothing?: A Little Help from another person to put on and taking off regular lower body clothing?: A Lot 6 Click Score: 16   End of Session Equipment Utilized During Treatment: Gait belt Nurse Communication: Mobility status  Activity Tolerance: Patient tolerated treatment well Patient left: in bed;with call bell/phone within reach;with bed alarm set;with family/visitor present  OT Visit Diagnosis: Unsteadiness on feet (R26.81);Other abnormalities of gait and mobility (R26.89);Muscle weakness (generalized) (M62.81);Other symptoms and signs involving cognitive function                Time: 1415-1438 OT Time Calculation (min): 23 min Charges:  OT General Charges $OT Visit: 1  Visit OT Evaluation $OT Eval Moderate Complexity: 1 Mod  Ryan Bartlett, Ryan Bartlett, Ryan Bartlett SecureChat Preferred Acute Rehab [phone]   Ryan Bartlett 07/13/2024, 3:52 PM

## 2024-07-13 NOTE — Telephone Encounter (Signed)
 Pharmacy Patient Advocate Encounter  Insurance verification completed.    The patient is insured through [employer].     Ran test claim for Lantus  100unt/ml Pen and the current 30 day co-pay is $4.   This test claim was processed through [employer]- copay amounts may vary at other pharmacies due to [employer], or as the patient moves through the different stages of their insurance plan.

## 2024-07-13 NOTE — Evaluation (Signed)
 Physical Therapy Evaluation Patient Details Name: Ryan Bartlett MRN: [id number] DOB: [birth year] Today's Date: 07/13/2024  History of Present Illness  Ryan Bartlett is a 28 y.o. male admitted 11/19 with n/v found to be in DKA.  11/21 in ICU for heavy sedation after agitation episode. PMH: ADHD, bipolar disorder, depression, oppositional defiant disorder, PTSD, schizoaffective disorder, marijuana and tobacco abuse, arthritis, asthma.   Clinical Impression  Pt admitted with above. PTA pt lives alone, doesn't work, but is indep without AD, cooks, and cares for self. Pt currently with both cognitive and functional deficits. Pt with impulsivity, decreased insight to deficits and safety, impaired gross coordination, impaired balance with cross over gait pattern at time requiring modA, incresaed falls risk, and poor attention to task, recall, and problem solving skills. Pt to strongly benefit from aggressive inpatient rehab program > 3 hrs a day to address both cognitive and functional deficits to achieve safe level of function for transition home. Pt with good support system and home set up. Acute PT to cont to follow.      If plan is discharge home, recommend the following: A lot of help with walking and/or transfers;A lot of help with bathing/dressing/bathroom;Supervision due to cognitive status;Assist for transportation   Can travel by private vehicle        Equipment Recommendations  (TBD)  Recommendations for Other Services  Rehab consult    Functional Status Assessment Patient has had a recent decline in their functional status and demonstrates the ability to make significant improvements in function in a reasonable and predictable amount of time.     Precautions / Restrictions Precautions Precautions: Fall Restrictions Weight Bearing Restrictions Per Provider Order: No      Mobility  Bed Mobility               General bed mobility comments: pt received sitting EOB     Transfers Overall transfer level: Needs assistance Equipment used: 2 person hand held assist Transfers: Sit to/from Stand Sit to Stand: Min assist, +2 physical assistance           General transfer comment: pt stood up on own impulsively and returned to sitting back on bed due to posterior bias    Ambulation/Gait Ambulation/Gait assistance: Min assist, Mod assist Gait Distance (Feet): 270 Feet Assistive device: 2 person hand held assist Gait Pattern/deviations: Step-through pattern, Decreased stride length, Narrow base of support, Staggering left, Staggering right Gait velocity: dec Gait velocity interpretation: <1.8 ft/sec, indicate of risk for recurrent falls   General Gait Details: pt with lateral sway and occasional crossover gait pattern, more so during turns requiring modA to prevent fall  Stairs            Wheelchair Mobility     Tilt Bed    Modified Rankin (Stroke Patients Only)       Balance Overall balance assessment: Needs assistance Sitting-balance support: No upper extremity supported, Feet supported Sitting balance-Leahy Scale: Fair     Standing balance support: Bilateral upper extremity supported, During functional activity Standing balance-Leahy Scale: Poor Standing balance comment: reliant on external support                             Pertinent Vitals/Pain Pain Assessment Pain Assessment: No/denies pain    Home Living Family/patient expects to be discharged to:: Private residence Living Arrangements: Alone Available Help at Discharge: Family;Friend(s);Available PRN/intermittently Type of Home: Apartment Home Access: Stairs to enter Entrance  Stairs-Rails: Left Entrance Stairs-Number of Steps: 3   Home Layout: One level Home Equipment: None      Prior Function Prior Level of Function : Independent/Modified Independent             Mobility Comments: doesn't work or drive, no AD, plays on computer and video  games ADLs Comments: indep     Extremity/Trunk Assessment   Upper Extremity Assessment Upper Extremity Assessment: Defer to OT evaluation    Lower Extremity Assessment Lower Extremity Assessment: Generalized weakness (impaired coordination with bilat LEs)    Cervical / Trunk Assessment Cervical / Trunk Assessment: Normal  Communication   Communication Communication: No apparent difficulties    Cognition Arousal: Alert Behavior During Therapy: Impulsive   PT - Cognitive impairments: Orientation, Awareness, Memory, Attention, Initiation, Sequencing, Problem solving, Safety/Judgement   Orientation impairments: Time, Situation                   PT - Cognition Comments: pt with blurred vision, however Grandmother states he needs glasses but wont get them. Per grandmother pt typical cognitively intact, cooks, cleans, and manages self. Today pt with impaired problem solving, sequencing, decreased insight to safety and deficits. Pt impulsive. Following commands: Impaired Following commands impaired: Follows one step commands with increased time     Cueing Cueing Techniques: Verbal cues, Gestural cues     General Comments General comments (skin integrity, edema, etc.): VSS    Exercises     Assessment/Plan    PT Assessment Patient needs continued PT services  PT Problem List Decreased strength;Decreased range of motion;Decreased activity tolerance;Decreased balance;Decreased mobility       PT Treatment Interventions DME instruction;Gait training;Stair training;Functional mobility training;Therapeutic activities;Therapeutic exercise;Balance training    PT Goals (Current goals can be found in the Care Plan section)  Acute Rehab PT Goals Patient Stated Goal: home PT Goal Formulation: With patient/family Time For Goal Achievement: 07/27/24 Potential to Achieve Goals: Good    Frequency Min 3X/week     Co-evaluation               AM-PAC PT 6 Clicks  Mobility  Outcome Measure Help needed turning from your back to your side while in a flat bed without using bedrails?: A Little Help needed moving from lying on your back to sitting on the side of a flat bed without using bedrails?: A Little Help needed moving to and from a bed to a chair (including a wheelchair)?: A Little Help needed standing up from a chair using your arms (e.g., wheelchair or bedside chair)?: A Lot Help needed to walk in hospital room?: A Lot Help needed climbing 3-5 steps with a railing? : A Lot 6 Click Score: 15    End of Session Equipment Utilized During Treatment: Gait belt Activity Tolerance: Patient tolerated treatment well Patient left: in bed (with OT) Nurse Communication: Mobility status PT Visit Diagnosis: Unsteadiness on feet (R26.81);Muscle weakness (generalized) (M62.81);Difficulty in walking, not elsewhere classified (R26.2)    Time: 8589-8567 PT Time Calculation (min) (ACUTE ONLY): 22 min   Charges:   PT Evaluation $PT Eval Moderate Complexity: 1 Mod   PT General Charges $$ ACUTE PT VISIT: 1 Visit         Norene Ames, PT, DPT Acute Rehabilitation Services Secure chat preferred Office #: [phone]   Norene CHRISTELLA Ames 07/13/2024, 2:53 PM

## 2024-07-13 NOTE — Progress Notes (Signed)
 Home medication ARISTADA  sent to pharmacy. Mother's pt states that this medication will be given tomorrow AM.

## 2024-07-14 ENCOUNTER — Other Ambulatory Visit (HOSPITAL_COMMUNITY): Payer: Self-pay

## 2024-07-14 ENCOUNTER — Telehealth (HOSPITAL_COMMUNITY): Payer: Self-pay

## 2024-07-14 DIAGNOSIS — R410 Disorientation, unspecified: Secondary | ICD-10-CM

## 2024-07-14 DIAGNOSIS — E871 Hypo-osmolality and hyponatremia: Secondary | ICD-10-CM

## 2024-07-14 LAB — GLUCOSE, CAPILLARY
Glucose-Capillary: 331 mg/dL — ABNORMAL HIGH (ref 70–99)
Glucose-Capillary: 303 mg/dL — ABNORMAL HIGH (ref 70–99)
Glucose-Capillary: 260 mg/dL — ABNORMAL HIGH (ref 70–99)
Glucose-Capillary: 422 mg/dL — ABNORMAL HIGH (ref 70–99)
Glucose-Capillary: 427 mg/dL — ABNORMAL HIGH (ref 70–99)

## 2024-07-14 LAB — BASIC METABOLIC PANEL WITH GFR
Anion gap: 10 (ref 5–15)
BUN: 8 mg/dL (ref 6–20)
CO2: 23 mmol/L (ref 22–32)
Calcium: 8.2 mg/dL — ABNORMAL LOW (ref 8.9–10.3)
Chloride: 101 mmol/L (ref 98–111)
Creatinine, Ser: 1.28 mg/dL — ABNORMAL HIGH (ref 0.61–1.24)
GFR, Estimated: >60 mL/min (ref >60)
Glucose, Bld: 306 mg/dL — ABNORMAL HIGH (ref 70–99)
Potassium: 4 mmol/L (ref 3.5–5.1)
Sodium: 134 mmol/L — ABNORMAL LOW (ref 135–145)

## 2024-07-14 LAB — IRON AND TIBC
Iron: 60 ug/dL (ref 45–182)
Saturation Ratios: 27 % (ref 17.9–39.5)
TIBC: 223 ug/dL — ABNORMAL LOW (ref 250–450)
UIBC: 163 ug/dL

## 2024-07-14 LAB — FERRITIN: Ferritin: 294 ng/mL (ref 24–336)

## 2024-07-14 MED ORDER — INSULIN ASPART 100 UNIT/ML IJ SOLN
6.0000 [IU] | Freq: Once | INTRAMUSCULAR | Status: AC
  Administered 2024-07-14: 6 [IU] via SUBCUTANEOUS
  Filled 2024-07-14: qty 6

## 2024-07-14 MED ORDER — INSULIN ASPART 100 UNIT/ML IJ SOLN
3.0000 [IU] | Freq: Three times a day (TID) | INTRAMUSCULAR | Status: DC
  Administered 2024-07-14 – 2024-07-15 (×3): 3 [IU] via SUBCUTANEOUS
  Filled 2024-07-14 (×3): qty 3

## 2024-07-14 MED ORDER — INSULIN GLARGINE-YFGN 100 UNIT/ML ~~LOC~~ SOLN
15.0000 [IU] | Freq: Two times a day (BID) | SUBCUTANEOUS | Status: DC
  Administered 2024-07-14 (×2): 15 [IU] via SUBCUTANEOUS
  Filled 2024-07-14 (×4): qty 0.15

## 2024-07-14 MED ORDER — ARIPIPRAZOLE LAUROXIL ER 1064 MG/3.9ML IM PRSY
1064.0000 mg | PREFILLED_SYRINGE | Freq: Once | INTRAMUSCULAR | Status: AC
  Administered 2024-07-14: 1064 mg via INTRAMUSCULAR
  Filled 2024-07-14: qty 3.9

## 2024-07-14 NOTE — Plan of Care (Signed)

## 2024-07-14 NOTE — Progress Notes (Signed)
   07/14/24 2055  Assess: MEWS Score  Temp 98.9 F (37.2 C)  BP (!) 140/129  MAP (mmHg) 135  Pulse Rate (!) 112  Resp 18  Level of Consciousness Alert  SpO2 100 %  O2 Device Room Air  Assess: MEWS Score  MEWS Temp 0  MEWS Systolic 0  MEWS Pulse 2  MEWS RR 0  MEWS LOC 0  MEWS Score 2  MEWS Score Color Yellow  Assess: if the MEWS score is Yellow or Red  Were vital signs accurate and taken at a resting state? Yes  Does the patient meet 2 or more of the SIRS criteria? No  Assess: SIRS CRITERIA  SIRS Temperature  0  SIRS Respirations  0  SIRS Pulse 1  SIRS WBC 0  SIRS Score Sum  1   Charge RN notified, Provider notified. Will continue to monitor.

## 2024-07-14 NOTE — Inpatient Diabetes Management (Addendum)
 Inpatient Diabetes Program Recommendations  AACE/ADA: New Consensus Statement on Inpatient Glycemic Control   Target Ranges:  Prepandial:   less than 140 mg/dL      Peak postprandial:   less than 180 mg/dL (1-2 hours)      Critically ill patients:  140 - 180 mg/dL   Lab Results  Component Value Date   GLUCAP 331 (H) 07/14/2024   HGBA1C 12.1 (H) 07/09/2024    Latest Reference Range & Units 07/13/24 07:39 07/13/24 11:44 07/13/24 16:01 07/13/24 20:54 07/14/24 09:42  Glucose-Capillary 70 - 99 mg/dL 859 (H) 704 (H) 751 (H) 324 (H) 331 (H)   Review of Glycemic Control  Diabetes history: New DM  Outpatient Diabetes medications:  Metformin  500 mg bid  Current orders for Inpatient glycemic control:  Novolog  0-15 units tid with meals and HS Semglee  15 units bid    Inpatient Diabetes Program Recommendations:   Noted Semglee  increased to 15 units BID today.   Please consider adding Novolog  4 units TID with meals (if patient consumes atleast 50% of meal).   Per Pharmacy: Ran test claim for Lantus  100unt/ml Pen and generic Novolog  and the current 30 day co-pay is $4.  Addendum @ 1354:  Spoke with patient at bedside. Patient unable to sit still and engaged in education appropriately. Attempted to reviewed normal A1C and blood glucose levels. Made patient aware his current A1C is 12.1% and will be discharged on Insulin . Briefly dicussed the difference between short acting and long acting insulin . Educated patient on insulin  pen use at home. Reviewed all steps of insulin  pen including attachment of needle, 2-unit air shot, dialing up dose, giving injection, removing needle, disposal of sharps, storage of unused insulin , disposal of insulin  etc.  Patient repeatedly asking where his mom and grandma are. Patient denies any questions but unable to teach back information. I made him aware diabetes coordinator will continue to provide him with education on diabetes and insulin  administration.    Called and spoke with pt's mom, Sonny over the phone. I made her aware patient will be discharged on insulin . She is concerned patient will not be compliant with medications, especially insulin  once discharged because he lives along. She has discussed this with MD. She also states she is a engineer, civil (consulting) and is familiar with insulin . She is unable to visit patient today but plans to come tomorrow. Requesting diabetes coordinator to come back at that time to provide further education to patient and herself.   Diabetes Coordinator will continue to follow.   Discharge Recommendations: Long acting recommendations: Insulin  Glargine (LANTUS ) Solostar Pen to be determined  Short acting recommendations:  Meal + Correction coverage Insulin  aspart (NOVOLOG ) FlexPen  Moderate Scale.  to be determined Hypoglycemia treatment recommendations: GVoke 1mg  Supply/Referral recommendations: Glucometer Test strips Lancet device Lancets Pen needles - standard Use Adult Diabetes Insulin  Treatment Post Discharge order set.  Thanks,  Lavanda Search, RN, MSN, [facility]  Inpatient Diabetes Coordinator  Pager [phone] (8a-5p)

## 2024-07-14 NOTE — Progress Notes (Signed)
 Inpatient Rehab Admissions Coordinator:   Per therapy recommendations pt was screened for CIR by Reche Lowers, PT, DPT.  Admitted on 11/20 with c/o of N/V.  Workup revealed DKA.  Hospital course toxic encephalopathy in the setting of baseline psychiatric diagnoses.  Hx of PSA.  Pt mobilizing 500' with therapy today without device.  Note pt lives alone and only PRN help available.  Given pt current level of function, reduced caregiver support, and payor source I cannot offer this pt a bed on CIR.  Recommend other rehab venues be pursued.    Reche Lowers, PT, DPT Admissions Coordinator [phone] 07/14/24 3:25 PM

## 2024-07-14 NOTE — Telephone Encounter (Signed)
 Pharmacy Patient Advocate Encounter  Insurance verification completed.    The patient is insured through [employer] [employer].     Ran test claim for Generic Novolog  100unit/ml and the current 30 day co-pay is $4.   This test claim was processed through [facility]- copay amounts may vary at other pharmacies due to [employer], or as the patient moves through the different stages of their insurance plan.

## 2024-07-14 NOTE — Progress Notes (Signed)
 Picked up home medication (aripiprazole ) from pharmacy to administer.

## 2024-07-14 NOTE — Progress Notes (Signed)
 Physical Therapy Treatment Patient Details Name: Ryan Bartlett MRN: [id number] DOB: Nov 11, 1995 Today's Date: 07/14/2024   History of Present Illness Ryan Bartlett is a 28 y.o. male admitted 11/19 with n/v found to be in DKA.  11/21 in ICU for heavy sedation after agitation episode. PMH: ADHD, bipolar disorder, depression, oppositional defiant disorder, PTSD, schizoaffective disorder, marijuana and tobacco abuse, arthritis, asthma.    PT Comments  Pt improved both functionally and cognitively from yesterday however continues to be impulsive, demo'd decreased insight to safety and deficits, increased fall risk as noted by 16/24 on DGI, and unable to sequencing multistep tasks. Pt remains unsafe to return home alone at this time despite improvements in cognition and function. Pt to strongly benefit from aggressive inpatient rehab to address both cognitive and functional deficits to achieve safe mod I level of function for return home. Pt demo's good rehab potential and the possibility to achieve safe mod I level of function at end of inpatient rehab stay. At this time patient would require 24/7 assist/supervision upon return home. Acute PT to cont to follow.    If plan is discharge home, recommend the following: A lot of help with walking and/or transfers;A lot of help with bathing/dressing/bathroom;Supervision due to cognitive status;Assist for transportation   Can travel by private vehicle        Equipment Recommendations   (TBD)    Recommendations for Other Services Rehab consult     Precautions / Restrictions Precautions Precautions: Fall Restrictions Weight Bearing Restrictions Per Provider Order: No     Mobility  Bed Mobility Overal bed mobility: Needs Assistance Bed Mobility: Supine to Sit     Supine to sit: Contact guard     General bed mobility comments: HOB flat, no physi    Transfers Overall transfer level: Needs assistance Equipment used: 1 person hand held  assist Transfers: Sit to/from Stand Sit to Stand: Min assist           General transfer comment: pt stood up with wide base of support, mild instability    Ambulation/Gait Ambulation/Gait assistance: Min assist Gait Distance (Feet): 500 Feet Assistive device: None Gait Pattern/deviations: Step-through pattern, Decreased stride length, Narrow base of support, Staggering left, Staggering right Gait velocity: dec Gait velocity interpretation: 1.31 - 2.62 ft/sec, indicative of limited community ambulator   General Gait Details: pt with lateral sway and noted LOB when multitasking. Pt favoring R side of hallway, pt with 2 static sitting rest breaks   Stairs             Wheelchair Mobility     Tilt Bed    Modified Rankin (Stroke Patients Only)       Balance Overall balance assessment: Needs assistance Sitting-balance support: No upper extremity supported, Feet supported Sitting balance-Leahy Scale: Fair     Standing balance support: Bilateral upper extremity supported, During functional activity Standing balance-Leahy Scale: Poor Standing balance comment: reliant on external support                 Standardized Balance Assessment Standardized Balance Assessment : Dynamic Gait Index   Dynamic Gait Index Level Surface: Mild Impairment Change in Gait Speed: Mild Impairment Gait with Horizontal Head Turns: Mild Impairment Gait with Vertical Head Turns: Mild Impairment Gait and Pivot Turn: Mild Impairment Step Over Obstacle: Mild Impairment Step Around Obstacles: Mild Impairment Steps: Mild Impairment Total Score: 16      Communication Communication Communication: No apparent difficulties  Cognition Arousal: Alert Behavior During Therapy:  Impulsive   PT - Cognitive impairments: Orientation, Awareness, Memory, Attention, Initiation, Sequencing, Problem solving, Safety/Judgement   Orientation impairments: Time, Situation                    PT - Cognition Comments: pt improved compared to yesterday however continues to demo decreased safety awareness, impulsivity, and impaired ability to discern between L/R and follow multistep commands Following commands: Impaired Following commands impaired: Follows one step commands with increased time    Cueing Cueing Techniques: Verbal cues, Gestural cues  Exercises      General Comments General comments (skin integrity, edema, etc.): VSS on RA      Pertinent Vitals/Pain Pain Assessment Pain Assessment: Faces Faces Pain Scale: Hurts little more Pain Location: L calf Pain Descriptors / Indicators: Discomfort    Home Living                          Prior Function            PT Goals (current goals can now be found in the care plan section) Acute Rehab PT Goals Patient Stated Goal: home PT Goal Formulation: With patient/family Time For Goal Achievement: 07/27/24 Potential to Achieve Goals: Good Progress towards PT goals: Progressing toward goals    Frequency    Min 3X/week      PT Plan      Co-evaluation              AM-PAC PT 6 Clicks Mobility   Outcome Measure  Help needed turning from your back to your side while in a flat bed without using bedrails?: A Little Help needed moving from lying on your back to sitting on the side of a flat bed without using bedrails?: A Little Help needed moving to and from a bed to a chair (including a wheelchair)?: A Little Help needed standing up from a chair using your arms (e.g., wheelchair or bedside chair)?: A Lot Help needed to walk in hospital room?: A Lot Help needed climbing 3-5 steps with a railing? : A Lot 6 Click Score: 15    End of Session Equipment Utilized During Treatment: Gait belt Activity Tolerance: Patient tolerated treatment well Patient left: in chair;with call bell/phone within reach Nurse Communication: Mobility status PT Visit Diagnosis: Unsteadiness on feet (R26.81);Muscle  weakness (generalized) (M62.81);Difficulty in walking, not elsewhere classified (R26.2)     Time: 8994-8976 PT Time Calculation (min) (ACUTE ONLY): 18 min  Charges:    $Gait Training: 8-22 mins PT General Charges $$ ACUTE PT VISIT: 1 Visit                     Norene Ames, PT, DPT Acute Rehabilitation Services Secure chat preferred Office #: [phone]    Norene CHRISTELLA Ames 07/14/2024, 1:44 PM

## 2024-07-14 NOTE — Progress Notes (Signed)
 [facility] PROGRESS NOTE   Ryan Bartlett FMW:[id number] DOB: 1996-01-18 DOA: 07/08/2024  PCP: Patient, No Pcp Per  Brief History: 27 yo male presented to [facility] 11/19 with complaints of nausea and vomiting. He was found to have hyperglycemic with elevated beta hydroxybutyric acid level. Pt was recently admitted to [facility] for URI symptoms, along with n/v and was discharged quickly.  Patient with history of bipolar disorder, depression, schizoaffective disorder.  Consultants: Critical care medicine  Procedures: None    Subjective/Interval History: Patient states that he is feeling much better.  Wondering if he can go home today.  Denies any nausea vomiting.  Seems to be a bit impulsive.    Assessment/Plan:  Acute toxic encephalopathy in the setting of baseline schizoaffective disorder and bipolar disorder Encephalopathy most likely due to DKA.  Patient required Precedex  in the ICU.  He was placed on phenobarbital  taper.  Mentation appears to have significantly improved. Patient is back on his psychotropic agents.  He is noted to be on Aristada  injections.  He is on Zyprexa  twice a day and trazodone  at bedtime. Hopefully he can come off of the Zyprexa  at discharge.  DKA Was on metformin  but this was for psychiatric indication and not for diabetes.  No previous history of diabetes. So he has new onset diabetes.  HbA1c 12.1. Patient was on insulin  infusion.  Anion gap has closed.  He was transitioned to subcutaneous insulin .  Currently on glargine 10 units twice a day and SSI.  Diabetes coordinator is following. Diabetes education in process.  He will need to be taught how to administer insulin .  His mother is his primary caregiver. Will need insulin  regimen for discharge.  Will reach out to the diabetes coordinator. CBGs noted to be in the 300s this morning.  Will increase the dose of glargine.  Acute kidney injury Corrected.  Hyponatremia Stable.  Normocytic anemia No  evidence for iron deficiency.  Class III obesity Estimated body mass index is 32.88 kg/m as calculated from the following:   Height as of this encounter: 5' 9 (1.753 m).   Weight as of this encounter: 101 kg.   DVT Prophylaxis: SCDs.  Ambulate in the hallway Code Status: Full code Family Communication: No family at bedside.  Tried to call mother but went to voicemail.  Will try again later Disposition Plan: Inpatient rehab recommended by PT.  Will have inpatient rehab coordinator screen him.    Medications: Scheduled:  ARIPiprazole  Lauroxil ER  1,064 mg Intramuscular Once   Chlorhexidine  Gluconate Cloth  6 each Topical Daily   insulin  aspart  0-15 Units Subcutaneous TID WC   insulin  aspart  0-5 Units Subcutaneous QHS   insulin  glargine-yfgn  10 Units Subcutaneous BID   melatonin  3 mg Oral QHS   multivitamin with minerals  1 tablet Oral Daily   OLANZapine  zydis  10 mg Oral QHS   OLANZapine  zydis  5 mg Oral Daily   pantoprazole   40 mg Oral Daily   PHENObarbital   32.5 mg Intravenous Q8H   thiamine   100 mg Oral Daily   Or   thiamine   100 mg Intravenous Daily   traZODone   50 mg Oral QHS   Continuous: PRN:acetaminophen , dextrose , hydrALAZINE , mouth rinse, oxyCODONE , ziprasidone   Antibiotics: Anti-infectives (From admission, onward)    None       Objective:  Vital Signs  Vitals:   07/14/24 0040 07/14/24 0211 07/14/24 0550 07/14/24 0826  BP: (!) 138/90 (!) 147/69 118/74 (!) 125/92  Pulse:  90 (!) 108 (!) 102 (!) 110  Resp: 18   18  Temp: 98 F (36.7 C)  98.3 F (36.8 C) 99 F (37.2 C)  TempSrc: Oral  Oral Oral  SpO2: 100% 97% 100% 100%  Weight:      Height:        Intake/Output Summary (Last 24 hours) at 07/14/2024 0944 Last data filed at 07/14/2024 0300 Gross per 24 hour  Intake 611.58 ml  Output 1000 ml  Net -388.42 ml   Filed Weights   07/11/24 1029  Weight: 101 kg    General appearance: Awake alert.  In no distress Resp: Clear to auscultation  bilaterally.  Normal effort Cardio: S1-S2 is normal regular.  No S3-S4.  No rubs murmurs or bruit GI: Abdomen is soft.  Nontender nondistended.  Bowel sounds are present normal.  No masses organomegaly Extremities: No edema.  Full range of motion of lower extremities. Neurologic:   No focal neurological deficits.    Lab Results:  Data Reviewed: I have personally reviewed following labs and reports of the imaging studies  CBC: Recent Labs  Lab 07/08/24 1515 07/08/24 1526 07/08/24 2013 07/09/24 0440 07/10/24 0638 07/11/24 0405 07/12/24 0412  WBC 16.4*  --   --  14.9* 10.8* 9.4 7.8  NEUTROABS 12.9*  --   --   --   --   --   --   HGB 17.9*   < > 18.7* 17.3* 14.3 11.9* 11.4*  HCT 48.7   < > 55.0* 47.0 40.5 35.5* 34.3*  MCV 77.4*  --   --  77.0* 79.9* 82.8 84.3  PLT 336  --   --  311 265 201 222   < > = values in this interval not displayed.    Basic Metabolic Panel: Recent Labs  Lab 07/12/24 0412 07/12/24 0917 07/12/24 1530 07/13/24 0123 07/14/24 0436  NA 137 141 135 135 134*  K 4.3 4.3 4.0 3.9 4.0  CL 106 105 102 102 101  CO2 21* 21* 26 23 23   GLUCOSE 164* 139* 210* 177* 306*  BUN 26* 25* 21* 14 8  CREATININE 1.35* 1.22 1.45* 1.23 1.28*  CALCIUM  7.7* 8.0* 8.0* 8.1* 8.2*    GFR: Estimated Creatinine Clearance: 98.8 mL/min (A) (by C-G formula based on SCr of 1.28 mg/dL (H)).  Liver Function Tests: Recent Labs  Lab 07/10/24 0638 07/11/24 0405 07/12/24 0412  AST 31 25 29   ALT 32 24 26  ALKPHOS 59 51 57  BILITOT 1.2 1.1 0.7  PROT 6.3* 6.1* 5.5*  ALBUMIN 3.0* 2.6* 2.6*    Cardiac Enzymes: Recent Labs  Lab 07/10/24 2223  CKTOTAL 392    CBG: Recent Labs  Lab 07/13/24 0739 07/13/24 1144 07/13/24 1601 07/13/24 2054 07/14/24 0942  GLUCAP 140* 295* 248* 324* 331*    Anemia Panel: Recent Labs    07/14/24 0436  FERRITIN 294  TIBC 223*  IRON 60    Recent Results (from the past 240 hours)  Resp panel by RT-PCR (RSV, Flu A&B, Covid) Anterior  Nasal Swab     Status: None   Collection Time: 07/05/24  4:58 PM   Specimen: Anterior Nasal Swab  Result Value Ref Range Status   SARS Coronavirus 2 by RT PCR NEGATIVE NEGATIVE Final    Comment: (NOTE) SARS-CoV-2 target nucleic acids are NOT DETECTED.  The SARS-CoV-2 RNA is generally detectable in upper respiratory specimens during the acute phase of infection. The lowest concentration of SARS-CoV-2 viral copies this assay can  detect is 138 copies/mL. A negative result does not preclude SARS-Cov-2 infection and should not be used as the sole basis for treatment or other patient management decisions. A negative result may occur with  improper specimen collection/handling, submission of specimen other than nasopharyngeal swab, presence of viral mutation(s) within the areas targeted by this assay, and inadequate number of viral copies(<138 copies/mL). A negative result must be combined with clinical observations, patient history, and epidemiological information. The expected result is Negative.  Fact Sheet for Patients:  bloggercourse.com  Fact Sheet for Healthcare Providers:  seriousbroker.it  This test is no t yet approved or cleared by the [place]  FDA and  has been authorized for detection and/or diagnosis of SARS-CoV-2 by FDA under an Emergency Use Authorization (EUA). This EUA will remain  in effect (meaning this test can be used) for the duration of the COVID-19 declaration under Section 564(b)(1) of the Act, 21 U.S.C.section 360bbb-3(b)(1), unless the authorization is terminated  or revoked sooner.       Influenza A by PCR NEGATIVE NEGATIVE Final   Influenza B by PCR NEGATIVE NEGATIVE Final    Comment: (NOTE) The Xpert Xpress SARS-CoV-2/FLU/RSV plus assay is intended as an aid in the diagnosis of influenza from Nasopharyngeal swab specimens and should not be used as a sole basis for treatment. Nasal washings  and aspirates are unacceptable for Xpert Xpress SARS-CoV-2/FLU/RSV testing.  Fact Sheet for Patients: bloggercourse.com  Fact Sheet for Healthcare Providers: seriousbroker.it  This test is not yet approved or cleared by the [place]  FDA and has been authorized for detection and/or diagnosis of SARS-CoV-2 by FDA under an Emergency Use Authorization (EUA). This EUA will remain in effect (meaning this test can be used) for the duration of the COVID-19 declaration under Section 564(b)(1) of the Act, 21 U.S.C. section 360bbb-3(b)(1), unless the authorization is terminated or revoked.     Resp Syncytial Virus by PCR NEGATIVE NEGATIVE Final    Comment: (NOTE) Fact Sheet for Patients: bloggercourse.com  Fact Sheet for Healthcare Providers: seriousbroker.it  This test is not yet approved or cleared by the [place]  FDA and has been authorized for detection and/or diagnosis of SARS-CoV-2 by FDA under an Emergency Use Authorization (EUA). This EUA will remain in effect (meaning this test can be used) for the duration of the COVID-19 declaration under Section 564(b)(1) of the Act, 21 U.S.C. section 360bbb-3(b)(1), unless the authorization is terminated or revoked.  Performed at [facility], [address] [place], [place] [address]   MRSA Next Gen by PCR, Nasal     Status: None   Collection Time: 07/10/24  3:00 AM   Specimen: Nasal Mucosa; Nasal Swab  Result Value Ref Range Status   MRSA by PCR Next Gen NOT DETECTED NOT DETECTED Final    Comment: (NOTE) The GeneXpert MRSA Assay (FDA approved for NASAL specimens only), is one component of a comprehensive MRSA colonization surveillance program. It is not intended to diagnose MRSA infection nor to guide or monitor treatment for MRSA infections. Test performance is not FDA approved in patients less than 109  years old. Performed at [facility], [address], [place], [place] [address]       Radiology Studies: No results found.     LOS: 5 days   Thurston Brendlinger [employer] on [email or web address]  07/14/2024, 9:44 AM

## 2024-07-14 NOTE — TOC Initial Note (Signed)
 Transition of Care [facility]) - Initial/Assessment Note    Patient Details  Name: Ryan Bartlett MRN: [id number] Date of Birth: 1996-05-21  [facility]) CM/SW Contact:    Jeoffrey LITTIE Moose, [place] Phone Number: 07/14/2024, 12:11 PM  Clinical Narrative:                 Pt admitted from home due to epigastric pain/ N&V. CSW completed ETOH consult with pt at bedside. Pt stated he has a PCP, lives in an apartment by himself and has transportation. Pt stated he does not use substances anymore and denied resources offered by CSW.     Barriers to Discharge: Continued Medical Work up   Patient Goals and CMS Choice            Expected Discharge Plan and Services       Living arrangements for the past 2 months: Apartment                                      Prior Living Arrangements/Services Living arrangements for the past 2 months: Apartment Lives with:: Self Patient language and need for interpreter reviewed:: Yes Do you feel safe going back to the place where you live?: Yes      Need for Family Participation in Patient Care: No (Comment) Care giver support system in place?: Yes (comment)   Criminal Activity/Legal Involvement Pertinent to Current Situation/Hospitalization: No - Comment as needed  Activities of Daily Living   ADL Screening (condition at time of admission) Independently performs ADLs?: Yes (appropriate for developmental age) Is the patient deaf or have difficulty hearing?: No Does the patient have difficulty seeing, even when wearing glasses/contacts?: No Does the patient have difficulty concentrating, remembering, or making decisions?: No  Permission Sought/Granted Permission sought to share information with : Family Supports    Share Information with NAME: Ryan Bartlett     Permission granted to share info w Relationship: Mother  Permission granted to share info w Contact Information: [phone]  Emotional Assessment Appearance::  Appears stated age Attitude/Demeanor/Rapport: Avoidant Affect (typically observed): Flat Orientation: : Oriented to Self, Oriented to Place, Oriented to  Time, Oriented to Situation Alcohol / Substance Use: Illicit Drugs Psych Involvement: No (comment)  Admission diagnosis:  Hyperkalemia [E87.5] Hyponatremia [E87.1] DKA (diabetic ketoacidosis) (HCC) [E11.10] Hyperglycemia [R73.9] AKI (acute kidney injury) [N17.9] Patient Active Problem List   Diagnosis Date Noted   Acute delirium 07/10/2024   DKA (diabetic ketoacidosis) (HCC) 07/09/2024   Emesis 07/09/2024   Hyponatremia 07/09/2024   AKI (acute kidney injury) 07/09/2024   Hyperkalemia 07/09/2024   Substance induced mood disorder (HCC) 10/04/2019   PTSD (post-traumatic stress disorder) 04/09/2018   Schizoaffective disorder, bipolar type (HCC) 04/08/2018   Adjustment disorder with depressed mood 11/22/2017   Suicidal thoughts 11/07/2016   Bipolar 2 disorder (HCC) 08/08/2016   Methamphetamine abuse (HCC) 03/08/2016   Bipolar affect, depressed (HCC) 09/18/2015   Bipolar affective disorder, currently depressed, mild (HCC) 09/17/2015   Intentional overdose of drug in tablet form (HCC)    Depression    Suicidal ideation    Cannabis use disorder, moderate, dependence (HCC) 06/06/2015   Alcohol use disorder, moderate, dependence (HCC) 06/06/2015   Bipolar I disorder (HCC) 08/19/2013   Cannabis abuse 03/30/2012   ADHD (attention deficit hyperactivity disorder), 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 [place] [address] Phone: [phone] Fax: [phone]     Social Drivers of Health (SDOH) Social History: SDOH Screenings   Food Insecurity: Unknown (07/10/2024)  Housing: Unknown (07/10/2024)  Transportation Needs: Patient Unable To Answer (07/10/2024)   Utilities: Patient Unable To Answer (07/10/2024)  Alcohol Screen: Low Risk  (04/08/2018)  Depression (PHQ2-9): Medium Risk (10/19/2020)  Tobacco Use: High Risk (07/11/2024)   SDOH Interventions:     Readmission Risk Interventions     No data to display

## 2024-07-14 NOTE — Progress Notes (Signed)
 SLP Cancellation Note  Patient Details Name: Ryan Bartlett MRN: [id number] DOB: 10/26/1995   Cancelled treatment:       Reason Eval/Treat Not Completed: Other (comment) Patient is admitted with DKA, acute toxic encephalopathy in the setting of baseline schizoaffective disorder and bipolar disorder. He required Precedex  in the ICU and placed on phenobarbital  taper but he is currently back on his psychotropic agents. SLP ordered to evaluate patient's cognition secondary to concerns for functional decline. SLP secure message chatted attending MD who stated that he suspects patient's mentation is altered due to DKA and his history of schizoaffective disorder. SLP recommends deferring cognitive evaluation at this time but if there is future concern for an irreversible cognitive impairment, please reorder.     Norleen IVAR Blase, MA, CCC-SLP Speech Therapy  07/14/2024, 1:40 PM

## 2024-07-15 ENCOUNTER — Other Ambulatory Visit (HOSPITAL_COMMUNITY): Payer: Self-pay

## 2024-07-15 LAB — CBC
HCT: 37 % — ABNORMAL LOW (ref 39.0–52.0)
Hemoglobin: 12.3 g/dL — ABNORMAL LOW (ref 13.0–17.0)
MCH: 27.7 pg (ref 26.0–34.0)
MCHC: 33.2 g/dL (ref 30.0–36.0)
MCV: 83.3 fL (ref 80.0–100.0)
Platelets: 344 K/uL (ref 150–400)
RBC: 4.44 MIL/uL (ref 4.22–5.81)
RDW: 13.2 % (ref 11.5–15.5)
WBC: 11.1 K/uL — ABNORMAL HIGH (ref 4.0–10.5)
nRBC: 0 % (ref 0.0–0.2)

## 2024-07-15 LAB — BASIC METABOLIC PANEL WITH GFR
Anion gap: 9 (ref 5–15)
BUN: 9 mg/dL (ref 6–20)
CO2: 22 mmol/L (ref 22–32)
Calcium: 8.4 mg/dL — ABNORMAL LOW (ref 8.9–10.3)
Chloride: 102 mmol/L (ref 98–111)
Creatinine, Ser: 1.19 mg/dL (ref 0.61–1.24)
GFR, Estimated: >60 mL/min (ref >60)
Glucose, Bld: 266 mg/dL — ABNORMAL HIGH (ref 70–99)
Potassium: 4.5 mmol/L (ref 3.5–5.1)
Sodium: 133 mmol/L — ABNORMAL LOW (ref 135–145)

## 2024-07-15 LAB — GLUCOSE, CAPILLARY
Glucose-Capillary: 282 mg/dL — ABNORMAL HIGH (ref 70–99)
Glucose-Capillary: 308 mg/dL — ABNORMAL HIGH (ref 70–99)
Glucose-Capillary: 385 mg/dL — ABNORMAL HIGH (ref 70–99)

## 2024-07-15 MED ORDER — INSULIN GLARGINE-YFGN 100 UNIT/ML ~~LOC~~ SOLN
20.0000 [IU] | Freq: Two times a day (BID) | SUBCUTANEOUS | Status: DC
  Administered 2024-07-15: 20 [IU] via SUBCUTANEOUS
  Filled 2024-07-15: qty 0.2

## 2024-07-15 MED ORDER — PEN NEEDLES 31G X 5 MM MISC
1.0000 | 0 refills | Status: DC
  Filled 2024-07-15: qty 100, fill #0

## 2024-07-15 MED ORDER — BLOOD GLUCOSE TEST VI STRP
1.0000 | ORAL_STRIP | 0 refills | Status: DC
  Filled 2024-07-15: qty 100, 25d supply, fill #0

## 2024-07-15 MED ORDER — GVOKE HYPOPEN 2-PACK 1 MG/0.2ML ~~LOC~~ SOAJ
1.0000 mg | SUBCUTANEOUS | 0 refills | Status: AC | PRN
  Filled 2024-07-15: qty 0.4, 30d supply, fill #0

## 2024-07-15 MED ORDER — INSULIN ASPART 100 UNIT/ML FLEXPEN
0.0000 [IU] | PEN_INJECTOR | Freq: Three times a day (TID) | SUBCUTANEOUS | 0 refills | Status: AC
  Filled 2024-07-15: qty 15, 83d supply, fill #0

## 2024-07-15 MED ORDER — LANCET DEVICE MISC
1.0000 | 0 refills | Status: DC
  Filled 2024-07-15: qty 1, fill #0

## 2024-07-15 MED ORDER — PHENOBARBITAL 32.4 MG PO TABS: 32.4000 mg | ORAL_TABLET | Freq: Two times a day (BID) | ORAL | Status: DC

## 2024-07-15 MED ORDER — METFORMIN HCL ER 500 MG PO TB24
500.0000 mg | ORAL_TABLET | Freq: Two times a day (BID) | ORAL | 0 refills | Status: DC
  Filled 2024-07-15: qty 60, 30d supply, fill #0

## 2024-07-15 MED ORDER — ACCU-CHEK SOFTCLIX LANCETS MISC
1.0000 | 0 refills | Status: DC
  Filled 2024-07-15: qty 100, 25d supply, fill #0

## 2024-07-15 MED ORDER — BLOOD GLUCOSE MONITOR SYSTEM W/DEVICE KIT
1.0000 | PACK | 0 refills | Status: DC
  Filled 2024-07-15: qty 1, 30d supply, fill #0

## 2024-07-15 MED ORDER — INSULIN GLARGINE 100 UNIT/ML SOLOSTAR PEN
20.0000 [IU] | PEN_INJECTOR | Freq: Two times a day (BID) | SUBCUTANEOUS | 0 refills | Status: AC
  Filled 2024-07-15: qty 15, 38d supply, fill #0

## 2024-07-15 MED ORDER — INSULIN PEN NEEDLE 32G X 4 MM MISC
1.0000 | 0 refills | Status: DC
  Filled 2024-07-15: qty 100, 25d supply, fill #0

## 2024-07-15 MED ORDER — OLANZAPINE 5 MG PO TBDP
5.0000 mg | ORAL_TABLET | Freq: Two times a day (BID) | ORAL | 0 refills | Status: AC
Start: 2024-07-15 — End: 2024-07-22
  Filled 2024-07-15: qty 14, 7d supply, fill #0

## 2024-07-15 MED ORDER — ACETAMINOPHEN 325 MG PO TABS: 650.0000 mg | ORAL_TABLET | Freq: Four times a day (QID) | ORAL | Status: AC | PRN

## 2024-07-15 NOTE — Progress Notes (Signed)
 Occupational Therapy Treatment Patient Details Name: Ryan Bartlett MRN: [id number] DOB: 01-Apr-1996 Today's Date: 07/15/2024   History of present illness Ryan Bartlett is a 28 y.o. male admitted 11/19 with n/v found to be in DKA.  11/21 in ICU for heavy sedation after agitation episode. PMH: ADHD, bipolar disorder, depression, oppositional defiant disorder, PTSD, schizoaffective disorder, marijuana and tobacco abuse, arthritis, asthma.   OT comments  Patient seen for further assessment of cognition. Patient found asleep on the couch, impulsive with all movement, but willing to ambulate. Patient unable to dual task, with OT asking patient to name an animal for each letter of the alphabet. Patient with increased difficulty maintaining letters in ABC order, jumping from letter J to R, and forgetting W, X and Y at the end. Patient requesting to sit halfway through task, and despite sitting, still with difficulty in being able to complete. Patient with decreased awareness of deficits, and poor insight into condition (asking for juice at end of session despite high sugar levels). Noted intensive rehab signing off, with OT recommending outpatient OT due to cognition. However, it is imperative that patient have 24/7 supervision due to risk for polypharmacy, and danger to himself or others if completing cooking tasks due to [facility] deficits.   BP 146/80 (97)       If plan is discharge home, recommend the following:  A little help with walking and/or transfers;A little help with bathing/dressing/bathroom;Assistance with cooking/housework;Direct supervision/assist for medications management;Direct supervision/assist for financial management;Assist for transportation;Supervision due to cognitive status   Equipment Recommendations  Tub/shower seat    Recommendations for Other Services      Precautions / Restrictions Precautions Precautions: Fall Restrictions Weight Bearing Restrictions Per Provider  Order: No       Mobility Bed Mobility Overal bed mobility: Needs Assistance             General bed mobility comments: sleeping on couch, no need for assist    Transfers Overall transfer level: Needs assistance   Transfers: Sit to/from Stand Sit to Stand: Supervision           General transfer comment: pt stood up with wide base of support, mild instability, but able to correct without OT assist     Balance Overall balance assessment: Needs assistance Sitting-balance support: No upper extremity supported, Feet supported Sitting balance-Leahy Scale: Good       Standing balance-Leahy Scale: Fair Standing balance comment: able to correct, but does sway                           ADL either performed or assessed with clinical judgement   ADL                                         General ADL Comments: Patient seen for further assessment of cognition. Patient found asleep on the couch, impulsive with all movement, but willing to ambulate. Patient unable to dual task, with OT asking patient to name an animal for each letter of the alphabet. Patient with increased difficulty maintaining letters in ABC order, jumping from letter J to R, and forgetting W, X and Y at the end. Patient requesting to sit halfway through task, and despite sitting, still with difficulty in being able to complete. Patient with decreased awareness of deficits, and poor insight into condition (asking for juice  at end of session despite high sugar levels). Noted intensive rehab signing off, with OT recommending outpatient OT due to cognition. However, it is imperative that patient have 24/7 supervision due to risk for polypharmacy, and danger to himself or others if completing cooking tasks due to [facility] deficits.    Extremity/Trunk Assessment              Occupational Psychologist Communication: No apparent difficulties    Cognition Arousal: Alert Behavior During Therapy: Impulsive Cognition: Cognition impaired     Awareness: Online awareness impaired, Intellectual awareness impaired Memory impairment (select all impairments): Short-term memory, Working memory Attention impairment (select first level of impairment): Sustained attention Executive functioning impairment (select all impairments): Problem solving, Reasoning OT - Cognition Comments: see note below                 Following commands: Impaired Following commands impaired: Follows one step commands with increased time      Cueing   Cueing Techniques: Verbal cues, Gestural cues  Exercises      Shoulder Instructions       General Comments      Pertinent Vitals/ Pain       Pain Assessment Pain Assessment: No/denies pain  Home Living                                          Prior Functioning/Environment              Frequency  Min 2X/week        Progress Toward Goals  OT Goals(current goals can now be found in the care plan section)  Progress towards OT goals: Progressing toward goals  Acute Rehab OT Goals Patient Stated Goal: to go home OT Goal Formulation: With patient Time For Goal Achievement: 07/27/24 Potential to Achieve Goals: Good  Plan      Co-evaluation                 AM-PAC OT 6 Clicks Daily Activity     Outcome Measure   Help from another person eating meals?: A Little Help from another person taking care of personal grooming?: A Little Help from another person toileting, which includes using toliet, bedpan, or urinal?: A Little Help from another person bathing (including washing, rinsing, drying)?: A Little Help from another person to put on and taking off regular upper body clothing?: A Little Help from another person to put on and taking off regular lower body clothing?: A Little 6 Click Score: 18    End of Session Equipment Utilized During Treatment:  Gait belt  OT Visit Diagnosis: Unsteadiness on feet (R26.81);Other abnormalities of gait and mobility (R26.89);Muscle weakness (generalized) (M62.81);Other symptoms and signs involving cognitive function   Activity Tolerance Patient tolerated treatment well   Patient Left  (sitting EOB)   Nurse Communication Mobility status        Time: 9067-9040 OT Time Calculation (min): 27 min  Charges: OT General Charges $OT Visit: 1 Visit OT Treatments $Self Care/Home Management : 23-37 mins  Ryan Bartlett, OTR/L Acute Rehabilitation Services [phone]   Ryan Gift Salt 07/15/2024, 12:05 PM

## 2024-07-15 NOTE — Progress Notes (Signed)
 Mobility Specialist Progress Note:    07/15/24 1248  Mobility  Activity Ambulated with assistance (In hallway)  Level of Assistance Contact guard assist, steadying assist  Assistive Device None  Distance Ambulated (ft) 510 ft  Activity Response Tolerated well  Mobility Referral Yes  Mobility visit 1 Mobility  Mobility Specialist Start Time (ACUTE ONLY) 1220  Mobility Specialist Stop Time (ACUTE ONLY) 1243  Mobility Specialist Time Calculation (min) (ACUTE ONLY) 23 min   Received pt in chair and eager for mobility. Pt required MinG for safety. Pt c/o RLE pain, pt required x1 seated rest break. Returned to room without fault. Pt left in chair with personal belongings and call light within reach. All needs met.  Lavanda Pollack Mobility Specialist  Please contact via [employer] or  Rehab Office [phone]

## 2024-07-15 NOTE — Plan of Care (Signed)

## 2024-07-15 NOTE — Progress Notes (Signed)
 Physician Discharge Summary  Ryan Bartlett FMW:[id number] DOB: [birth year] DOA: 07/08/2024  PCP: Patient, No Pcp Per  Admit date: 07/08/2024 Discharge date: 07/15/2024  Admitted from: Home Discharge disposition: Home  Recommendations at discharge:  Stop methamphetamine use Encourage compliance to insulin ,, metformin , glucose checks Follow-up with psychiatry as an outpatient.  Subjective: Patient was seen and examined this morning. African male.  Not in distress.  Hemodynamically stable. Was ambulating inside the room independently. Family not at bedside.  I tried to call his mom from the room but she did not pick up.  Brief narrative: Ryan Bartlett is a 28 y.o. male with PMH significant for obesity, no prior h/o diabetes, multiple neuropsych issues (ADHD, bipolar disorder, depression, PTSD, schizoaffective disorder). 11/90, patient presented to ED with complaint of nausea and vomiting. Initial workup showed DKA with hyperglycemia, elevated ketones Admitted to [facility] for DKA Next day, patient was significantly altered and required ICU transfer and Precedex  initiation. 11/25, transferred out to [facility].  Hospital course: DKA New onset diabetes mellitus Patient was on metformin  500 mg twice daily as an outpatient but apparently this was for weight loss and did not have any prior history of diabetes.  Last known A1c was 6.5 in 2022. Presented with nausea, vomiting Found to be in DKA Started on DKA protocol with IV fluids, insulin  drip, electrolyte monitoring.  Symptoms improved, anion gap closed Subsequently switched to subcutaneous insulin  Diabetes care coordinator was consulted Currently on basal and bolus regimen with Semglee  15 units twice daily, scheduled Premeal aspart 3 units 3 times daily as well as SSI with Accu-Cheks. Blood sugar level in the last 24 hours noted.  Still remains elevated.  308 this morning. Increased Semglee  to 20 units twice daily this morning.   Metformin  resumed as well.  To discharge with Semglee  20 units daily, Premeal sliding scale insulin  and metformin  BID. Recent Labs  Lab 07/14/24 1954 07/14/24 2145 07/15/24 0306 07/15/24 0836 07/15/24 1255  GLUCAP 422* 427* 282* 308* 385*   Acute toxic encephalopathy Chronic methamphetamine use H/o multiple neuropsych issues (ADHD, bipolar disorder, depression, PTSD, schizoaffective disorder) After 24 hours of admission, patient was altered, agitated.  Patient tells me that he probably had withdrawal from methamphetamine.  He had similar experience in the past as well. Patient was transferred to ICU and started on Precedex  drip as well as phenobarbital  taper. Mental status gradually improved PTA meds- Aristada  monthly injection.  He states he follows with psychiatrist Dr. Malinda as an outpatient. Aristada  injection was given inpatient on 11/25 Currently on phenobarbital  injection 32.5 mg 3 times daily, Zyprexa  5 mg/10 mg -am/pm, trazodone  50 mg nightly.  I have given him a prescription for Zyprexa  5 mg twice daily at discharge.   Patient states he can arrange follow-up with his psychiatrist Dr. Malinda early next week.   Acute kidney injury Secondary to DKA.  Creatinine improved. Recent Labs    07/10/24 2223 07/11/24 0405 07/11/24 1534 07/11/24 2146 07/12/24 0412 07/12/24 0917 07/12/24 1530 07/13/24 0123 07/14/24 0436 07/15/24 0732  BUN 50* 47* 37* 32* 26* 25* 21* 14 8 9   CREATININE 2.18* 2.03* 1.71* 1.52* 1.35* 1.22 1.45* 1.23 1.28* 1.19  CO2 19* 20* 24 21* 21* 21* 26 23 23 22    Mild hyponatremia Stable. Recent Labs  Lab 07/10/24 2223 07/11/24 0405 07/11/24 1534 07/11/24 2146 07/12/24 0412 07/12/24 0917 07/12/24 1530 07/13/24 0123 07/14/24 0436 07/15/24 0732  NA 131* 134* 134* 138 137 141 135 135 134* 133*  Obesity 1 Body mass index is 32.88 kg/m. Patient has been advised to make an attempt to improve diet and exercise patterns to aid in weight loss.  Goals  of care   Code Status: Full Code   Diet:  Diet Order             Diet general           Diet Carb Modified Fluid consistency: Thin; Room service appropriate? Yes  Diet effective now                   Nutritional status:  Body mass index is 32.88 kg/m.       Wounds: -    Discharge Medications:   Allergies as of 07/15/2024       Reactions   Ativan  [lorazepam ] Anxiety   Worsening Anxiety/Agitation    Peanut (diagnostic) Anaphylaxis   Lactose Intolerance (gi) Diarrhea, Nausea And Vomiting        Medication List     TAKE these medications    acetaminophen  325 MG tablet Commonly known as: TYLENOL  Take 2 tablets (650 mg total) by mouth every 6 (six) hours as needed for mild pain (pain score 1-3).   Aristada  1064 MG/3.9ML prefilled syringe Generic drug: ARIPiprazole  Lauroxil ER SMARTSIG:106 Milligram(s) IM Every 4 Weeks   Blood Glucose Monitoring Suppl Devi 1 each by Does not apply route as directed. Dispense based on patient and insurance preference. Use up to four times daily as directed. (FOR ICD-10 E10.9, E11.9).   BLOOD GLUCOSE TEST STRIPS Strp 1 each by Does not apply route as directed. Dispense based on patient and insurance preference. Use up to four times daily as directed. (FOR ICD-10 E10.9, E11.9).   Gvoke HypoPen  2-Pack 1 MG/0.2ML Soaj Generic drug: Glucagon  Inject 1 mg into the skin as needed for up to 2 doses (Severe low blood sugar).   insulin  aspart 100 UNIT/ML FlexPen Commonly known as: NOVOLOG  Inject 0-6 Units into the skin 3 (three) times daily with meals. Check Blood Glucose (BG) and inject per scale: BG <150= 0 unit; BG 150-200= 1 unit; BG 201-250= 2 unit; BG 251-300= 3 unit; BG 301-350= 4 unit; BG 351-400= 5 unit; BG >400= 6 unit and Call Primary Care.   insulin  glargine 100 UNIT/ML Solostar Pen Commonly known as: LANTUS  Inject 20 Units into the skin 2 (two) times daily. May substitute as needed per insurance.   Lancet Device  Misc 1 each by Does not apply route as directed. Dispense based on patient and insurance preference. Use up to four times daily as directed. (FOR ICD-10 E10.9, E11.9).   Lancets Misc 1 each by Does not apply route as directed. Dispense based on patient and insurance preference. Use up to four times daily as directed. (FOR ICD-10 E10.9, E11.9).   metFORMIN  500 MG 24 hr tablet Commonly known as: GLUCOPHAGE -XR Take 1 tablet (500 mg total) by mouth 2 (two) times daily.   OLANZapine  zydis 5 MG disintegrating tablet Commonly known as: ZYPREXA  Take 1 tablet (5 mg total) by mouth 2 (two) times daily for 7 days.   Pen Needles 31G X 5 MM Misc 1 each by Does not apply route as directed. Dispense based on patient and insurance preference. Use up to four times daily as directed. (FOR ICD-10 E10.9, E11.9).   Pen Needles 31G X 8 MM Misc 1 each by Does not apply route as directed. Dispense based on patient and insurance preference. Use up to four times daily as directed. (  FOR ICD-10 E10.9, E11.9).         Follow ups:    Follow-up Information     [facility] Follow up.   Specialty: Behavioral Health Contact information: [address] [place] [address] [place]  [phone]        [facility] Follow up.   Contact information: [address] [employer] [address] [place]  [address] [phone]                Discharge Instructions:   Discharge Instructions     Call MD for:  difficulty breathing, headache or visual disturbances   Complete by: As directed    Call MD for:  extreme fatigue   Complete by: As directed    Call MD for:  hives   Complete by: As directed    Call MD for:  persistant dizziness or light-headedness   Complete by: As directed    Call MD for:  persistant nausea and vomiting   Complete by: As directed    Call MD for:  severe uncontrolled pain   Complete  by: As directed    Call MD for:  temperature >100.4   Complete by: As directed    Diet general   Complete by: As directed    Discharge instructions   Complete by: As directed    Recommendations at discharge:   Stop methamphetamine use  Encourage compliance to insulin ,, metformin , glucose checks  Follow-up with psychiatry as an outpatient.  Discharge instructions for diabetes mellitus: Check blood sugar 3 times a day and bedtime at home. If blood sugar running above 200 or less than 70 please call your MD to adjust insulin . If you notice signs and symptoms of hypoglycemia (low blood sugar) like jitteriness, confusion, thirst, tremor and sweating, please check blood sugar, drink sugary drink/biscuits/sweets to increase sugar level and call MD or return to ER.      General discharge instructions: Follow with Primary MD Patient, No Pcp Per in 7 days  Please request your PCP  to go over your hospital tests, procedures, radiology results at the follow up. Please get your medicines reviewed and adjusted.  Your PCP may decide to repeat certain labs or tests as needed. Do not drive, operate heavy machinery, perform activities at heights, swimming or participation in water  activities or provide baby sitting services if your were admitted for syncope or siezures until you have seen by Primary MD or a Neurologist and advised to do so again. [place]  Controlled Substance Reporting System database was reviewed. Do not drive, operate heavy machinery, perform activities at heights, swim, participate in water  activities or provide baby-sitting services while on medications for pain, sleep and mood until your outpatient physician has reevaluated you and advised to do so again.  You are strongly recommended to comply with the dose, frequency and duration of prescribed medications. Activity: As tolerated with Full fall precautions use walker/cane & assistance as needed Avoid using any recreational  substances like cigarette, tobacco, alcohol, or non-prescribed drug. If you experience worsening of your admission symptoms, develop shortness of breath, life threatening emergency, suicidal or homicidal thoughts you must seek medical attention immediately by calling 911 or calling your MD immediately  if symptoms less severe. You must read complete instructions/literature along with all the possible adverse reactions/side effects for all the medicines you take and that have been prescribed to you. Take any new medicine only after you have completely understood  and accepted all the possible adverse reactions/side effects.  Wear Seat belts while driving. You were cared for by a hospitalist during your hospital stay. If you have any questions about your discharge medications or the care you received while you were in the hospital after you are discharged, you can call the unit and ask to speak with the hospitalist or the covering physician. Once you are discharged, your primary care physician will handle any further medical issues. Please note that NO REFILLS for any discharge medications will be authorized once you are discharged, as it is imperative that you return to your primary care physician (or establish a relationship with a primary care physician if you do not have one).   Increase activity slowly   Complete by: As directed        Discharge Exam:   Vitals:   07/14/24 2239 07/15/24 0315 07/15/24 0614 07/15/24 0937  BP: 119/65 (!) 109/57 134/82 (!) 146/80  Pulse: (!) 108 97 (!) 118 (!) 106  Resp: 18 18  17   Temp: 98.6 F (37 C) 98.2 F (36.8 C) 98.3 F (36.8 C) 98.7 F (37.1 C)  TempSrc: Oral Oral Oral Oral  SpO2: 100% 98% 100% 98%  Weight:      Height:        Body mass index is 32.88 kg/m.  General exam: Pleasant, young African-American male Skin: No rashes, lesions or ulcers. HEENT: Atraumatic, normocephalic, no obvious bleeding Lungs: Clear to auscultation bilaterally,   CVS: S1, S2, no murmur,   GI/Abd: Soft, nontender, nondistended, bowel sound present,   CNS: Alert, awake, oriented x 3 Psychiatry: Mood appropriate Extremities: No pedal edema, no calf tenderness,    The results of significant diagnostics from this hospitalization (including imaging, microbiology, ancillary and laboratory) are listed below for reference.    Procedures and Diagnostic Studies:   CT ABDOMEN PELVIS WO CONTRAST Result Date: 07/08/2024 EXAM: CT ABDOMEN AND PELVIS WITHOUT CONTRAST 07/08/2024 06:45:00 PM TECHNIQUE: CT of the abdomen and pelvis was performed without the administration of intravenous contrast. Multiplanar reformatted images are provided for review. Automated exposure control, iterative reconstruction, and/or weight-based adjustment of the mA/kV was utilized to reduce the radiation dose to as low as reasonably achievable. COMPARISON: None available. CLINICAL HISTORY: Abdominal pain, acute, nonlocalized. FINDINGS: LOWER CHEST: No acute abnormality. LIVER: The liver is unremarkable. GALLBLADDER AND BILE DUCTS: Gallbladder is unremarkable. No biliary ductal dilatation. SPLEEN: No acute abnormality. PANCREAS: No acute abnormality. ADRENAL GLANDS: No acute abnormality. KIDNEYS, URETERS AND BLADDER: No stones in the kidneys or ureters. No hydronephrosis. No perinephric or periureteral stranding. Urinary bladder is unremarkable. GI AND BOWEL: Fluid is seen within the mid esophagus which may reflect changes of esophageal dysmotility or gastroesophageal reflux. Appendix normal. The stomach, small bowel, and large bowel are otherwise unremarkable. There is no bowel obstruction. PERITONEUM AND RETROPERITONEUM: No ascites. No free air. VASCULATURE: Aorta is normal in caliber. LYMPH NODES: No lymphadenopathy. REPRODUCTIVE ORGANS: No acute abnormality. BONES AND SOFT TISSUES: Possible injection granuloma within the subcutaneous soft tissues of the gluteal regions bilaterally. No acute  osseous abnormality. IMPRESSION: 1. No acute findings in the abdomen or pelvis 2. Fluid within the mid esophagus, possibly related to esophageal dysmotility or gastroesophageal reflux Electronically signed by: Dorethia Molt MD 07/08/2024 07:32 PM EST RP Workstation: [id number]     Labs:   Basic Metabolic Panel: Recent Labs  Lab 07/12/24 0917 07/12/24 1530 07/13/24 0123 07/14/24 0436 07/15/24 0732  NA 141 135 135 134* 133*  K  4.3 4.0 3.9 4.0 4.5  CL 105 102 102 101 102  CO2 21* 26 23 23 22   GLUCOSE 139* 210* 177* 306* 266*  BUN 25* 21* 14 8 9   CREATININE 1.22 1.45* 1.23 1.28* 1.19  CALCIUM  8.0* 8.0* 8.1* 8.2* 8.4*   GFR Estimated Creatinine Clearance: 106.3 mL/min (by C-G formula based on SCr of 1.19 mg/dL). Liver Function Tests: Recent Labs  Lab 07/10/24 0638 07/11/24 0405 07/12/24 0412  AST 31 25 29   ALT 32 24 26  ALKPHOS 59 51 57  BILITOT 1.2 1.1 0.7  PROT 6.3* 6.1* 5.5*  ALBUMIN 3.0* 2.6* 2.6*   No results for input(s): LIPASE, AMYLASE in the last 168 hours. No results for input(s): AMMONIA in the last 168 hours. Coagulation profile No results for input(s): INR, PROTIME in the last 168 hours.  CBC: Recent Labs  Lab 07/08/24 1515 07/08/24 1526 07/09/24 0440 07/10/24 0638 07/11/24 0405 07/12/24 0412 07/15/24 0732  WBC 16.4*  --  14.9* 10.8* 9.4 7.8 11.1*  NEUTROABS 12.9*  --   --   --   --   --   --   HGB 17.9*   < > 17.3* 14.3 11.9* 11.4* 12.3*  HCT 48.7   < > 47.0 40.5 35.5* 34.3* 37.0*  MCV 77.4*  --  77.0* 79.9* 82.8 84.3 83.3  PLT 336  --  311 265 201 222 344   < > = values in this interval not displayed.   Cardiac Enzymes: Recent Labs  Lab 07/10/24 2223  CKTOTAL 392   BNP: Invalid input(s): POCBNP CBG: Recent Labs  Lab 07/14/24 1954 07/14/24 2145 07/15/24 0306 07/15/24 0836 07/15/24 1255  GLUCAP 422* 427* 282* 308* 385*   D-Dimer No results for input(s): DDIMER in the last 72 hours. Hgb A1c No results for input(s):  HGBA1C in the last 72 hours. Lipid Profile No results for input(s): CHOL, HDL, LDLCALC, TRIG, CHOLHDL, LDLDIRECT in the last 72 hours. Thyroid  function studies No results for input(s): TSH, T4TOTAL, T3FREE, THYROIDAB in the last 72 hours.  Invalid input(s): FREET3 Anemia work up Recent Labs    07/14/24 0436  FERRITIN 294  TIBC 223*  IRON 60   Microbiology Recent Results (from the past 240 hours)  Resp panel by RT-PCR (RSV, Flu A&B, Covid) Anterior Nasal Swab     Status: None   Collection Time: 07/05/24  4:58 PM   Specimen: Anterior Nasal Swab  Result Value Ref Range Status   SARS Coronavirus 2 by RT PCR NEGATIVE NEGATIVE Final    Comment: (NOTE) SARS-CoV-2 target nucleic acids are NOT DETECTED.  The SARS-CoV-2 RNA is generally detectable in upper respiratory specimens during the acute phase of infection. The lowest concentration of SARS-CoV-2 viral copies this assay can detect is 138 copies/mL. A negative result does not preclude SARS-Cov-2 infection and should not be used as the sole basis for treatment or other patient management decisions. A negative result may occur with  improper specimen collection/handling, submission of specimen other than nasopharyngeal swab, presence of viral mutation(s) within the areas targeted by this assay, and inadequate number of viral copies(<138 copies/mL). A negative result must be combined with clinical observations, patient history, and epidemiological information. The expected result is Negative.  Fact Sheet for Patients:  bloggercourse.com  Fact Sheet for Healthcare Providers:  seriousbroker.it  This test is no t yet approved or cleared by the [place]  FDA and  has been authorized for detection and/or diagnosis of SARS-CoV-2 by FDA under an  Emergency Use Authorization (EUA). This EUA will remain  in effect (meaning this test can be used) for the duration  of the COVID-19 declaration under Section 564(b)(1) of the Act, 21 U.S.C.section 360bbb-3(b)(1), unless the authorization is terminated  or revoked sooner.       Influenza A by PCR NEGATIVE NEGATIVE Final   Influenza B by PCR NEGATIVE NEGATIVE Final    Comment: (NOTE) The Xpert Xpress SARS-CoV-2/FLU/RSV plus assay is intended as an aid in the diagnosis of influenza from Nasopharyngeal swab specimens and should not be used as a sole basis for treatment. Nasal washings and aspirates are unacceptable for Xpert Xpress SARS-CoV-2/FLU/RSV testing.  Fact Sheet for Patients: bloggercourse.com  Fact Sheet for Healthcare Providers: seriousbroker.it  This test is not yet approved or cleared by the [place]  FDA and has been authorized for detection and/or diagnosis of SARS-CoV-2 by FDA under an Emergency Use Authorization (EUA). This EUA will remain in effect (meaning this test can be used) for the duration of the COVID-19 declaration under Section 564(b)(1) of the Act, 21 U.S.C. section 360bbb-3(b)(1), unless the authorization is terminated or revoked.     Resp Syncytial Virus by PCR NEGATIVE NEGATIVE Final    Comment: (NOTE) Fact Sheet for Patients: bloggercourse.com  Fact Sheet for Healthcare Providers: seriousbroker.it  This test is not yet approved or cleared by the [place]  FDA and has been authorized for detection and/or diagnosis of SARS-CoV-2 by FDA under an Emergency Use Authorization (EUA). This EUA will remain in effect (meaning this test can be used) for the duration of the COVID-19 declaration under Section 564(b)(1) of the Act, 21 U.S.C. section 360bbb-3(b)(1), unless the authorization is terminated or revoked.  Performed at [facility], [address] [place], [place] [address]   MRSA Next Gen by PCR, Nasal     Status: None    Collection Time: 07/10/24  3:00 AM   Specimen: Nasal Mucosa; Nasal Swab  Result Value Ref Range Status   MRSA by PCR Next Gen NOT DETECTED NOT DETECTED Final    Comment: (NOTE) The GeneXpert MRSA Assay (FDA approved for NASAL specimens only), is one component of a comprehensive MRSA colonization surveillance program. It is not intended to diagnose MRSA infection nor to guide or monitor treatment for MRSA infections. Test performance is not FDA approved in patients less than 65 years old. Performed at [facility], [address] [place], [place] [address]     Time coordinating discharge: 45 minutes  Signed: Salma Walrond  [facility] 07/15/2024, 1:19 PM

## 2024-07-15 NOTE — Discharge Summary (Signed)
 Physician Discharge Summary  Ryan Bartlett FMW:[id number] DOB: Apr 28, 1996 DOA: 07/08/2024  PCP: Patient, No Pcp Per  Admit date: 07/08/2024 Discharge date: 07/15/2024  Admitted from: Home Discharge disposition: Home  Recommendations at discharge:  Stop methamphetamine use Encourage compliance to insulin ,, metformin , glucose checks Follow-up with psychiatry as an outpatient.  Subjective: Patient was seen and examined this morning. African male.  Not in distress.  Hemodynamically stable. Was ambulating inside the room independently. Family not at bedside.  I tried to call his mom from the room but she did not pick up.  Brief narrative: Ryan Bartlett is a 28 y.o. male with PMH significant for obesity, no prior h/o diabetes, multiple neuropsych issues (ADHD, bipolar disorder, depression, PTSD, schizoaffective disorder). 11/90, patient presented to ED with complaint of nausea and vomiting. Initial workup showed DKA with hyperglycemia, elevated ketones Admitted to [facility] for DKA Next day, patient was significantly altered and required ICU transfer and Precedex  initiation. 11/25, transferred out to [facility].  Hospital course: DKA New onset diabetes mellitus Patient was on metformin  500 mg twice daily as an outpatient but apparently this was for weight loss and did not have any prior history of diabetes.  Last known A1c was 6.5 in 2022. Presented with nausea, vomiting Found to be in DKA Started on DKA protocol with IV fluids, insulin  drip, electrolyte monitoring.  Symptoms improved, anion gap closed Subsequently switched to subcutaneous insulin  Diabetes care coordinator was consulted Currently on basal and bolus regimen with Semglee  15 units twice daily, scheduled Premeal aspart 3 units 3 times daily as well as SSI with Accu-Cheks. Blood sugar level in the last 24 hours noted.  Still remains elevated.  308 this morning. Increased Semglee  to 20 units twice daily this morning.   Metformin  resumed as well.  To discharge with Semglee  20 units daily, Premeal sliding scale insulin  and metformin  BID. Recent Labs  Lab 07/14/24 1954 07/14/24 2145 07/15/24 0306 07/15/24 0836 07/15/24 1255  GLUCAP 422* 427* 282* 308* 385*   Acute toxic encephalopathy Chronic methamphetamine use H/o multiple neuropsych issues (ADHD, bipolar disorder, depression, PTSD, schizoaffective disorder) After 24 hours of admission, patient was altered, agitated.  Patient tells me that he probably had withdrawal from methamphetamine.  He had similar experience in the past as well. Patient was transferred to ICU and started on Precedex  drip as well as phenobarbital  taper. Mental status gradually improved PTA meds- Aristada  monthly injection.  He states he follows with psychiatrist Dr. Malinda as an outpatient. Aristada  injection was given inpatient on 11/25 Currently on phenobarbital  injection 32.5 mg 3 times daily, Zyprexa  5 mg/10 mg -am/pm, trazodone  50 mg nightly.  I have given him a prescription for Zyprexa  5 mg twice daily at discharge.   Patient states he can arrange follow-up with his psychiatrist Dr. Malinda early next week.   Acute kidney injury Secondary to DKA.  Creatinine improved. Recent Labs    07/10/24 2223 07/11/24 0405 07/11/24 1534 07/11/24 2146 07/12/24 0412 07/12/24 0917 07/12/24 1530 07/13/24 0123 07/14/24 0436 07/15/24 0732  BUN 50* 47* 37* 32* 26* 25* 21* 14 8 9   CREATININE 2.18* 2.03* 1.71* 1.52* 1.35* 1.22 1.45* 1.23 1.28* 1.19  CO2 19* 20* 24 21* 21* 21* 26 23 23 22    Mild hyponatremia Stable. Recent Labs  Lab 07/10/24 2223 07/11/24 0405 07/11/24 1534 07/11/24 2146 07/12/24 0412 07/12/24 0917 07/12/24 1530 07/13/24 0123 07/14/24 0436 07/15/24 0732  NA 131* 134* 134* 138 137 141 135 135 134* 133*  Obesity 1 Body mass index is 32.88 kg/m. Patient has been advised to make an attempt to improve diet and exercise patterns to aid in weight loss.  Goals  of care   Code Status: Full Code   Diet:  Diet Order             Diet general           Diet Carb Modified Fluid consistency: Thin; Room service appropriate? Yes  Diet effective now                   Nutritional status:  Body mass index is 32.88 kg/m.       Wounds: -    Discharge Medications:   Allergies as of 07/15/2024       Reactions   Ativan  [lorazepam ] Anxiety   Worsening Anxiety/Agitation    Peanut (diagnostic) Anaphylaxis   Lactose Intolerance (gi) Diarrhea, Nausea And Vomiting        Medication List     TAKE these medications    acetaminophen  325 MG tablet Commonly known as: TYLENOL  Take 2 tablets (650 mg total) by mouth every 6 (six) hours as needed for mild pain (pain score 1-3).   Aristada  1064 MG/3.9ML prefilled syringe Generic drug: ARIPiprazole  Lauroxil ER SMARTSIG:106 Milligram(s) IM Every 4 Weeks   Blood Glucose Monitor System w/Device Kit 1 each by Does not apply route as directed. Dispense based on patient and insurance preference. Use up to four times daily as directed. (FOR ICD-10 E10.9, E11.9).   BLOOD GLUCOSE TEST STRIPS Strp 1 each by Does not apply route as directed. Dispense based on patient and insurance preference. Use up to four times daily as directed. (FOR ICD-10 E10.9, E11.9).   Gvoke HypoPen  2-Pack 1 MG/0.2ML Soaj Generic drug: Glucagon  Inject 1 mg into the skin as needed for up to 2 doses (Severe low blood sugar).   insulin  aspart 100 UNIT/ML FlexPen Commonly known as: NOVOLOG  Inject 0-6 Units into the skin 3 (three) times daily with meals. Check Blood Glucose (BG) and inject per scale: BG <150= 0 unit; BG 150-200= 1 unit; BG 201-250= 2 unit; BG 251-300= 3 unit; BG 301-350= 4 unit; BG 351-400= 5 unit; BG >400= 6 unit and Call Primary Care.   insulin  glargine 100 UNIT/ML Solostar Pen Commonly known as: LANTUS  Inject 20 Units into the skin 2 (two) times daily. May substitute as needed per insurance.   Lancet Device  Misc 1 each by Does not apply route as directed. Dispense based on patient and insurance preference. Use up to four times daily as directed. (FOR ICD-10 E10.9, E11.9).   Lancets Misc 1 each by Does not apply route as directed. Dispense based on patient and insurance preference. Use up to four times daily as directed. (FOR ICD-10 E10.9, E11.9).   metFORMIN  500 MG 24 hr tablet Commonly known as: GLUCOPHAGE -XR Take 1 tablet (500 mg total) by mouth 2 (two) times daily.   OLANZapine  zydis 5 MG disintegrating tablet Commonly known as: ZYPREXA  Take 1 tablet (5 mg total) by mouth 2 (two) times daily for 7 days.   Pen Needles 31G X 5 MM Misc 1 each by Does not apply route as directed. Dispense based on patient and insurance preference. Use up to four times daily as directed. (FOR ICD-10 E10.9, E11.9).   Pen Needles 31G X 8 MM Misc 1 each by Does not apply route as directed. Dispense based on patient and insurance preference. Use up to four times daily as  directed. (FOR ICD-10 E10.9, E11.9).         Follow ups:    Follow-up Information     [facility] Follow up.   Specialty: Behavioral Health Contact information: [address] [place] [address] [place]  [phone]        [facility] Follow up.   Contact information: [address] [place]  [address] [phone]                Discharge Instructions:   Discharge Instructions     Call MD for:  difficulty breathing, headache or visual disturbances   Complete by: As directed    Call MD for:  extreme fatigue   Complete by: As directed    Call MD for:  hives   Complete by: As directed    Call MD for:  persistant dizziness or light-headedness   Complete by: As directed    Call MD for:  persistant nausea and vomiting   Complete by: As directed    Call MD for:  severe uncontrolled pain   Complete  by: As directed    Call MD for:  temperature >100.4   Complete by: As directed    Diet general   Complete by: As directed    Discharge instructions   Complete by: As directed    Recommendations at discharge:   Stop methamphetamine use  Encourage compliance to insulin ,, metformin , glucose checks  Follow-up with psychiatry as an outpatient.  Discharge instructions for diabetes mellitus: Check blood sugar 3 times a day and bedtime at home. If blood sugar running above 200 or less than 70 please call your MD to adjust insulin . If you notice signs and symptoms of hypoglycemia (low blood sugar) like jitteriness, confusion, thirst, tremor and sweating, please check blood sugar, drink sugary drink/biscuits/sweets to increase sugar level and call MD or return to ER.      General discharge instructions: Follow with Primary MD Patient, No Pcp Per in 7 days  Please request your PCP  to go over your hospital tests, procedures, radiology results at the follow up. Please get your medicines reviewed and adjusted.  Your PCP may decide to repeat certain labs or tests as needed. Do not drive, operate heavy machinery, perform activities at heights, swimming or participation in water  activities or provide baby sitting services if your were admitted for syncope or siezures until you have seen by Primary MD or a Neurologist and advised to do so again. [place]  Controlled Substance Reporting System database was reviewed. Do not drive, operate heavy machinery, perform activities at heights, swim, participate in water  activities or provide baby-sitting services while on medications for pain, sleep and mood until your outpatient physician has reevaluated you and advised to do so again.  You are strongly recommended to comply with the dose, frequency and duration of prescribed medications. Activity: As tolerated with Full fall precautions use walker/cane & assistance as needed Avoid using any recreational  substances like cigarette, tobacco, alcohol, or non-prescribed drug. If you experience worsening of your admission symptoms, develop shortness of breath, life threatening emergency, suicidal or homicidal thoughts you must seek medical attention immediately by calling 911 or calling your MD immediately  if symptoms less severe. You must read complete instructions/literature along with all the possible adverse reactions/side effects for all the medicines you take and that have been prescribed to you. Take any new medicine only after you have completely  understood and accepted all the possible adverse reactions/side effects.  Wear Seat belts while driving. You were cared for by a hospitalist during your hospital stay. If you have any questions about your discharge medications or the care you received while you were in the hospital after you are discharged, you can call the unit and ask to speak with the hospitalist or the covering physician. Once you are discharged, your primary care physician will handle any further medical issues. Please note that NO REFILLS for any discharge medications will be authorized once you are discharged, as it is imperative that you return to your primary care physician (or establish a relationship with a primary care physician if you do not have one).   Increase activity slowly   Complete by: As directed        Discharge Exam:   Vitals:   07/14/24 2239 07/15/24 0315 07/15/24 0614 07/15/24 0937  BP: 119/65 (!) 109/57 134/82 (!) 146/80  Pulse: (!) 108 97 (!) 118 (!) 106  Resp: 18 18  17   Temp: 98.6 F (37 C) 98.2 F (36.8 C) 98.3 F (36.8 C) 98.7 F (37.1 C)  TempSrc: Oral Oral Oral Oral  SpO2: 100% 98% 100% 98%  Weight:      Height:        Body mass index is 32.88 kg/m.  General exam: Pleasant, young African-American male Skin: No rashes, lesions or ulcers. HEENT: Atraumatic, normocephalic, no obvious bleeding Lungs: Clear to auscultation bilaterally,   CVS: S1, S2, no murmur,   GI/Abd: Soft, nontender, nondistended, bowel sound present,   CNS: Alert, awake, oriented x 3 Psychiatry: Mood appropriate Extremities: No pedal edema, no calf tenderness,    The results of significant diagnostics from this hospitalization (including imaging, microbiology, ancillary and laboratory) are listed below for reference.    Procedures and Diagnostic Studies:   CT ABDOMEN PELVIS WO CONTRAST Result Date: 07/08/2024 EXAM: CT ABDOMEN AND PELVIS WITHOUT CONTRAST 07/08/2024 06:45:00 PM TECHNIQUE: CT of the abdomen and pelvis was performed without the administration of intravenous contrast. Multiplanar reformatted images are provided for review. Automated exposure control, iterative reconstruction, and/or weight-based adjustment of the mA/kV was utilized to reduce the radiation dose to as low as reasonably achievable. COMPARISON: None available. CLINICAL HISTORY: Abdominal pain, acute, nonlocalized. FINDINGS: LOWER CHEST: No acute abnormality. LIVER: The liver is unremarkable. GALLBLADDER AND BILE DUCTS: Gallbladder is unremarkable. No biliary ductal dilatation. SPLEEN: No acute abnormality. PANCREAS: No acute abnormality. ADRENAL GLANDS: No acute abnormality. KIDNEYS, URETERS AND BLADDER: No stones in the kidneys or ureters. No hydronephrosis. No perinephric or periureteral stranding. Urinary bladder is unremarkable. GI AND BOWEL: Fluid is seen within the mid esophagus which may reflect changes of esophageal dysmotility or gastroesophageal reflux. Appendix normal. The stomach, small bowel, and large bowel are otherwise unremarkable. There is no bowel obstruction. PERITONEUM AND RETROPERITONEUM: No ascites. No free air. VASCULATURE: Aorta is normal in caliber. LYMPH NODES: No lymphadenopathy. REPRODUCTIVE ORGANS: No acute abnormality. BONES AND SOFT TISSUES: Possible injection granuloma within the subcutaneous soft tissues of the gluteal regions bilaterally. No acute  osseous abnormality. IMPRESSION: 1. No acute findings in the abdomen or pelvis 2. Fluid within the mid esophagus, possibly related to esophageal dysmotility or gastroesophageal reflux Electronically signed by: Dorethia Molt MD 07/08/2024 07:32 PM EST RP Workstation: [id number]     Labs:   Basic Metabolic Panel: Recent Labs  Lab 07/12/24 0917 07/12/24 1530 07/13/24 0123 07/14/24 0436 07/15/24 0732  NA 141 135 135 134* 133*  K 4.3 4.0 3.9 4.0 4.5  CL 105 102 102 101 102  CO2 21* 26 23 23 22   GLUCOSE 139* 210* 177* 306* 266*  BUN 25* 21* 14 8 9   CREATININE 1.22 1.45* 1.23 1.28* 1.19  CALCIUM  8.0* 8.0* 8.1* 8.2* 8.4*   GFR Estimated Creatinine Clearance: 106.3 mL/min (by C-G formula based on SCr of 1.19 mg/dL). Liver Function Tests: Recent Labs  Lab 07/10/24 0638 07/11/24 0405 07/12/24 0412  AST 31 25 29   ALT 32 24 26  ALKPHOS 59 51 57  BILITOT 1.2 1.1 0.7  PROT 6.3* 6.1* 5.5*  ALBUMIN 3.0* 2.6* 2.6*   No results for input(s): LIPASE, AMYLASE in the last 168 hours. No results for input(s): AMMONIA in the last 168 hours. Coagulation profile No results for input(s): INR, PROTIME in the last 168 hours.  CBC: Recent Labs  Lab 07/08/24 1515 07/08/24 1526 07/09/24 0440 07/10/24 0638 07/11/24 0405 07/12/24 0412 07/15/24 0732  WBC 16.4*  --  14.9* 10.8* 9.4 7.8 11.1*  NEUTROABS 12.9*  --   --   --   --   --   --   HGB 17.9*   < > 17.3* 14.3 11.9* 11.4* 12.3*  HCT 48.7   < > 47.0 40.5 35.5* 34.3* 37.0*  MCV 77.4*  --  77.0* 79.9* 82.8 84.3 83.3  PLT 336  --  311 265 201 222 344   < > = values in this interval not displayed.   Cardiac Enzymes: Recent Labs  Lab 07/10/24 2223  CKTOTAL 392   BNP: Invalid input(s): POCBNP CBG: Recent Labs  Lab 07/14/24 1954 07/14/24 2145 07/15/24 0306 07/15/24 0836 07/15/24 1255  GLUCAP 422* 427* 282* 308* 385*   D-Dimer No results for input(s): DDIMER in the last 72 hours. Hgb A1c No results for input(s):  HGBA1C in the last 72 hours. Lipid Profile No results for input(s): CHOL, HDL, LDLCALC, TRIG, CHOLHDL, LDLDIRECT in the last 72 hours. Thyroid  function studies No results for input(s): TSH, T4TOTAL, T3FREE, THYROIDAB in the last 72 hours.  Invalid input(s): FREET3 Anemia work up Recent Labs    07/14/24 0436  FERRITIN 294  TIBC 223*  IRON 60   Microbiology Recent Results (from the past 240 hours)  Resp panel by RT-PCR (RSV, Flu A&B, Covid) Anterior Nasal Swab     Status: None   Collection Time: 07/05/24  4:58 PM   Specimen: Anterior Nasal Swab  Result Value Ref Range Status   SARS Coronavirus 2 by RT PCR NEGATIVE NEGATIVE Final    Comment: (NOTE) SARS-CoV-2 target nucleic acids are NOT DETECTED.  The SARS-CoV-2 RNA is generally detectable in upper respiratory specimens during the acute phase of infection. The lowest concentration of SARS-CoV-2 viral copies this assay can detect is 138 copies/mL. A negative result does not preclude SARS-Cov-2 infection and should not be used as the sole basis for treatment or other patient management decisions. A negative result may occur with  improper specimen collection/handling, submission of specimen other than nasopharyngeal swab, presence of viral mutation(s) within the areas targeted by this assay, and inadequate number of viral copies(<138 copies/mL). A negative result must be combined with clinical observations, patient history, and epidemiological information. The expected result is Negative.  Fact Sheet for Patients:  bloggercourse.com  Fact Sheet for Healthcare Providers:  seriousbroker.it  This test is no t yet approved or cleared by the [place]  FDA and  has been authorized for detection and/or diagnosis of SARS-CoV-2 by FDA under  an Emergency Use Authorization (EUA). This EUA will remain  in effect (meaning this test can be used) for the duration  of the COVID-19 declaration under Section 564(b)(1) of the Act, 21 U.S.C.section 360bbb-3(b)(1), unless the authorization is terminated  or revoked sooner.       Influenza A by PCR NEGATIVE NEGATIVE Final   Influenza B by PCR NEGATIVE NEGATIVE Final    Comment: (NOTE) The Xpert Xpress SARS-CoV-2/FLU/RSV plus assay is intended as an aid in the diagnosis of influenza from Nasopharyngeal swab specimens and should not be used as a sole basis for treatment. Nasal washings and aspirates are unacceptable for Xpert Xpress SARS-CoV-2/FLU/RSV testing.  Fact Sheet for Patients: bloggercourse.com  Fact Sheet for Healthcare Providers: seriousbroker.it  This test is not yet approved or cleared by the [place]  FDA and has been authorized for detection and/or diagnosis of SARS-CoV-2 by FDA under an Emergency Use Authorization (EUA). This EUA will remain in effect (meaning this test can be used) for the duration of the COVID-19 declaration under Section 564(b)(1) of the Act, 21 U.S.C. section 360bbb-3(b)(1), unless the authorization is terminated or revoked.     Resp Syncytial Virus by PCR NEGATIVE NEGATIVE Final    Comment: (NOTE) Fact Sheet for Patients: bloggercourse.com  Fact Sheet for Healthcare Providers: seriousbroker.it  This test is not yet approved or cleared by the [place]  FDA and has been authorized for detection and/or diagnosis of SARS-CoV-2 by FDA under an Emergency Use Authorization (EUA). This EUA will remain in effect (meaning this test can be used) for the duration of the COVID-19 declaration under Section 564(b)(1) of the Act, 21 U.S.C. section 360bbb-3(b)(1), unless the authorization is terminated or revoked.  Performed at [facility], [address], [place], [place] [address]   MRSA Next Gen by PCR, Nasal     Status: None    Collection Time: 07/10/24  3:00 AM   Specimen: Nasal Mucosa; Nasal Swab  Result Value Ref Range Status   MRSA by PCR Next Gen NOT DETECTED NOT DETECTED Final    Comment: (NOTE) The GeneXpert MRSA Assay (FDA approved for NASAL specimens only), is one component of a comprehensive MRSA colonization surveillance program. It is not intended to diagnose MRSA infection nor to guide or monitor treatment for MRSA infections. Test performance is not FDA approved in patients less than 30 years old. Performed at [facility], [address] [place], [place] [address]     Time coordinating discharge: 45 minutes  Signed: Daiana Vitiello  [facility] 07/15/2024, 1:25 PM

## 2024-07-15 NOTE — Progress Notes (Signed)
 Pt and his mom discharged home this pm after going over discharge instructions with all questions addressed. Home meds supplied by TOC at discharge

## 2024-07-15 NOTE — Inpatient Diabetes Management (Addendum)
 Inpatient Diabetes Program Recommendations  AACE/ADA: New Consensus Statement on Inpatient Glycemic Control (2015)  Target Ranges:  Prepandial:   less than 140 mg/dL      Peak postprandial:   less than 180 mg/dL (1-2 hours)      Critically ill patients:  140 - 180 mg/dL   Lab Results  Component Value Date   GLUCAP 308 (H) 07/15/2024   HGBA1C 12.1 (H) 07/09/2024    Latest Reference Range & Units 07/14/24 09:42 07/14/24 11:58 07/14/24 17:39 07/14/24 19:54 07/14/24 21:45 07/15/24 03:06 07/15/24 08:36  Glucose-Capillary 70 - 99 mg/dL 668 (H) 696 (H) 739 (H) 422 (H) 427 (H) 282 (H) 308 (H)   Review of Glycemic Control  Diabetes history:  Outpatient Diabetes medications:  Current orders for Inpatient glycemic control:   Inpatient Diabetes Program Recommendations:   Noted Semglee  increased to 20 units BID today.   Please consider increasing Novolog  meal coverage to 5 units TID with meals.     Addendum @ 1430: Spoke with patient and mother at beside. Spoke with patient about new diabetes diagnosis. Reviewed A1C results of 12.1%  and explained what an A1C is and informed patient that his current A1C indicates an average glucose of 301 mg/dl over the past 2-3 months.  Reviewed basic pathophysiology of DM Type 2, basic home care, importance of checking CBGs and maintaining good CBG control to prevent long-term and short-term complications.  Reviewed glucose and A1C goals. Reviewed signs and symptoms of hyperglycemia and hypoglycemia along with treatment for both.  Discussed impact of nutrition, exercise, stress, sickness, and medications on diabetes control. Discussed nutrition and how to count carbohydrates. Encouraged patient to avoid drinks high in sugar or regular sodas. Informed patient that he will be prescribed Short acting and Long acting insulin . Dicussed how and when to administer both.   Asked patient to check his glucose 4 times per day (before meals and at bedtime) and to keep a  log book of glucose readings and insulin  taken.  Reviewed with patient and mother insulin  pen use at home. Reviewed all steps of insulin  pen including attachment of needle, 2-unit air shot, dialing up dose, giving injection, removing needle, disposal of sharps, storage of unused insulin , disposal of insulin  etc. Patient able to provide successful return demonstration. Also reviewed troubleshooting with insulin  pen. Patient or mother understood education and have no further questions at this time.     Discharge Recommendations: Long acting recommendations: Insulin  Glargine (LANTUS ) Solostar Pen to be determined  Short acting recommendations:  Meal + Correction coverage Insulin  aspart (NOVOLOG ) FlexPen  Moderate Scale.  to be determined Hypoglycemia treatment recommendations: GVoke 1mg  Supply/Referral recommendations: Glucometer Test strips Lancet device Lancets Pen needles - standard  Use Adult Diabetes Insulin  Treatment Post Discharge order set.  Thanks,  Lavanda Search, RN, MSN, [facility]  Inpatient Diabetes Coordinator  Pager [phone] (8a-5p)

## 2024-08-05 ENCOUNTER — Inpatient Hospital Stay (HOSPITAL_COMMUNITY)
Admission: EM | Admit: 2024-08-05 | Discharge: 2024-08-07 | DRG: 638 | Disposition: A | Discharge status: Home / Self Care | Payer: MEDICAID | Attending: Internal Medicine | Admitting: Internal Medicine

## 2024-08-05 ENCOUNTER — Inpatient Hospital Stay (HOSPITAL_COMMUNITY): Payer: MEDICAID

## 2024-08-05 ENCOUNTER — Other Ambulatory Visit: Payer: Self-pay

## 2024-08-05 ENCOUNTER — Encounter (HOSPITAL_COMMUNITY): Payer: Self-pay | Admitting: Emergency Medicine

## 2024-08-05 DIAGNOSIS — F4310 Post-traumatic stress disorder, unspecified: Secondary | ICD-10-CM | POA: Diagnosis present

## 2024-08-05 DIAGNOSIS — E66812 Obesity, class 2: Secondary | ICD-10-CM | POA: Diagnosis present

## 2024-08-05 DIAGNOSIS — F1510 Other stimulant abuse, uncomplicated: Secondary | ICD-10-CM | POA: Diagnosis present

## 2024-08-05 DIAGNOSIS — R1111 Vomiting without nausea: Secondary | ICD-10-CM

## 2024-08-05 DIAGNOSIS — J45909 Unspecified asthma, uncomplicated: Secondary | ICD-10-CM | POA: Diagnosis present

## 2024-08-05 DIAGNOSIS — F1210 Cannabis abuse, uncomplicated: Secondary | ICD-10-CM | POA: Diagnosis present

## 2024-08-05 DIAGNOSIS — E1010 Type 1 diabetes mellitus with ketoacidosis without coma: Principal | ICD-10-CM | POA: Diagnosis present

## 2024-08-05 DIAGNOSIS — Z818 Family history of other mental and behavioral disorders: Secondary | ICD-10-CM | POA: Diagnosis not present

## 2024-08-05 DIAGNOSIS — E669 Obesity, unspecified: Secondary | ICD-10-CM | POA: Diagnosis not present

## 2024-08-05 DIAGNOSIS — Z7984 Long term (current) use of oral hypoglycemic drugs: Secondary | ICD-10-CM | POA: Diagnosis not present

## 2024-08-05 DIAGNOSIS — E1165 Type 2 diabetes mellitus with hyperglycemia: Secondary | ICD-10-CM | POA: Diagnosis not present

## 2024-08-05 DIAGNOSIS — Z79899 Other long term (current) drug therapy: Secondary | ICD-10-CM | POA: Diagnosis not present

## 2024-08-05 DIAGNOSIS — F1410 Cocaine abuse, uncomplicated: Secondary | ICD-10-CM | POA: Diagnosis present

## 2024-08-05 DIAGNOSIS — F913 Oppositional defiant disorder: Secondary | ICD-10-CM | POA: Diagnosis present

## 2024-08-05 DIAGNOSIS — N179 Acute kidney failure, unspecified: Secondary | ICD-10-CM | POA: Diagnosis present

## 2024-08-05 DIAGNOSIS — F909 Attention-deficit hyperactivity disorder, unspecified type: Secondary | ICD-10-CM | POA: Diagnosis present

## 2024-08-05 DIAGNOSIS — E1110 Type 2 diabetes mellitus with ketoacidosis without coma: Secondary | ICD-10-CM | POA: Diagnosis not present

## 2024-08-05 DIAGNOSIS — F250 Schizoaffective disorder, bipolar type: Secondary | ICD-10-CM | POA: Diagnosis present

## 2024-08-05 DIAGNOSIS — F1910 Other psychoactive substance abuse, uncomplicated: Secondary | ICD-10-CM | POA: Diagnosis not present

## 2024-08-05 DIAGNOSIS — E875 Hyperkalemia: Secondary | ICD-10-CM | POA: Diagnosis present

## 2024-08-05 DIAGNOSIS — R3589 Other polyuria: Secondary | ICD-10-CM | POA: Diagnosis present

## 2024-08-05 DIAGNOSIS — D72829 Elevated white blood cell count, unspecified: Secondary | ICD-10-CM | POA: Diagnosis present

## 2024-08-05 DIAGNOSIS — Z6837 Body mass index (BMI) 37.0-37.9, adult: Secondary | ICD-10-CM

## 2024-08-05 DIAGNOSIS — Z794 Long term (current) use of insulin: Secondary | ICD-10-CM

## 2024-08-05 DIAGNOSIS — Z8249 Family history of ischemic heart disease and other diseases of the circulatory system: Secondary | ICD-10-CM

## 2024-08-05 DIAGNOSIS — F17210 Nicotine dependence, cigarettes, uncomplicated: Secondary | ICD-10-CM | POA: Diagnosis present

## 2024-08-05 DIAGNOSIS — M1990 Unspecified osteoarthritis, unspecified site: Secondary | ICD-10-CM | POA: Diagnosis present

## 2024-08-05 DIAGNOSIS — E871 Hypo-osmolality and hyponatremia: Secondary | ICD-10-CM | POA: Diagnosis present

## 2024-08-05 DIAGNOSIS — Z833 Family history of diabetes mellitus: Secondary | ICD-10-CM | POA: Diagnosis not present

## 2024-08-05 LAB — CBC WITH DIFFERENTIAL/PLATELET
Abs Immature Granulocytes: 0.04 K/uL (ref 0.00–0.07)
Basophils Absolute: 0.1 K/uL (ref 0.0–0.1)
Basophils Relative: 1 %
Eosinophils Absolute: 0.1 K/uL (ref 0.0–0.5)
Eosinophils Relative: 1 %
HCT: 44.1 % (ref 39.0–52.0)
Hemoglobin: 15.3 g/dL (ref 13.0–17.0)
Immature Granulocytes: 0 %
Lymphocytes Relative: 16 %
Lymphs Abs: 1.7 K/uL (ref 0.7–4.0)
MCH: 28.4 pg (ref 26.0–34.0)
MCHC: 34.7 g/dL (ref 30.0–36.0)
MCV: 81.8 fL (ref 80.0–100.0)
Monocytes Absolute: 1.2 K/uL — ABNORMAL HIGH (ref 0.1–1.0)
Monocytes Relative: 11 %
Neutro Abs: 7.5 K/uL (ref 1.7–7.7)
Neutrophils Relative %: 71 %
Platelets: 347 K/uL (ref 150–400)
RBC: 5.39 MIL/uL (ref 4.22–5.81)
RDW: 13.7 % (ref 11.5–15.5)
WBC: 10.6 K/uL — ABNORMAL HIGH (ref 4.0–10.5)
nRBC: 0 % (ref 0.0–0.2)

## 2024-08-05 LAB — BASIC METABOLIC PANEL WITH GFR
Anion gap: 27 — ABNORMAL HIGH (ref 5–15)
Anion gap: 19 — ABNORMAL HIGH (ref 5–15)
Anion gap: 18 — ABNORMAL HIGH (ref 5–15)
Anion gap: 13 (ref 5–15)
BUN: 24 mg/dL — ABNORMAL HIGH (ref 6–20)
BUN: 20 mg/dL (ref 6–20)
BUN: 18 mg/dL (ref 6–20)
BUN: 18 mg/dL (ref 6–20)
CO2: 11 mmol/L — ABNORMAL LOW (ref 22–32)
CO2: 19 mmol/L — ABNORMAL LOW (ref 22–32)
CO2: 21 mmol/L — ABNORMAL LOW (ref 22–32)
CO2: 22 mmol/L (ref 22–32)
Calcium: 9.8 mg/dL (ref 8.9–10.3)
Calcium: 10 mg/dL (ref 8.9–10.3)
Calcium: 9.8 mg/dL (ref 8.9–10.3)
Calcium: 8.7 mg/dL — ABNORMAL LOW (ref 8.9–10.3)
Chloride: 80 mmol/L — ABNORMAL LOW (ref 98–111)
Chloride: 91 mmol/L — ABNORMAL LOW (ref 98–111)
Chloride: 94 mmol/L — ABNORMAL LOW (ref 98–111)
Chloride: 96 mmol/L — ABNORMAL LOW (ref 98–111)
Creatinine, Ser: 1.5 mg/dL — ABNORMAL HIGH (ref 0.61–1.24)
Creatinine, Ser: 1.24 mg/dL (ref 0.61–1.24)
Creatinine, Ser: 1.22 mg/dL (ref 0.61–1.24)
Creatinine, Ser: 1.12 mg/dL (ref 0.61–1.24)
GFR, Estimated: >60 mL/min (ref >60)
GFR, Estimated: >60 mL/min (ref >60)
GFR, Estimated: >60 mL/min (ref >60)
GFR, Estimated: >60 mL/min (ref >60)
Glucose, Bld: 776 mg/dL (ref 70–99)
Glucose, Bld: 261 mg/dL — ABNORMAL HIGH (ref 70–99)
Glucose, Bld: 195 mg/dL — ABNORMAL HIGH (ref 70–99)
Glucose, Bld: 292 mg/dL — ABNORMAL HIGH (ref 70–99)
Potassium: 5.2 mmol/L — ABNORMAL HIGH (ref 3.5–5.1)
Potassium: 4.4 mmol/L (ref 3.5–5.1)
Potassium: 3.5 mmol/L (ref 3.5–5.1)
Potassium: 3.2 mmol/L — ABNORMAL LOW (ref 3.5–5.1)
Sodium: 118 mmol/L — CL (ref 135–145)
Sodium: 129 mmol/L — ABNORMAL LOW (ref 135–145)
Sodium: 133 mmol/L — ABNORMAL LOW (ref 135–145)
Sodium: 131 mmol/L — ABNORMAL LOW (ref 135–145)

## 2024-08-05 LAB — I-STAT CHEM 8, ED
BUN: 29 mg/dL — ABNORMAL HIGH (ref 6–20)
Calcium, Ion: 1.13 mmol/L — ABNORMAL LOW (ref 1.15–1.40)
Chloride: 87 mmol/L — ABNORMAL LOW (ref 98–111)
Creatinine, Ser: 1.4 mg/dL — ABNORMAL HIGH (ref 0.61–1.24)
Glucose, Bld: >700 mg/dL (ref 70–99)
HCT: 49 % (ref 39.0–52.0)
Hemoglobin: 16.7 g/dL (ref 13.0–17.0)
Potassium: 6.4 mmol/L (ref 3.5–5.1)
Sodium: 114 mmol/L — CL (ref 135–145)
TCO2: 16 mmol/L — ABNORMAL LOW (ref 22–32)

## 2024-08-05 LAB — URINALYSIS, ROUTINE W REFLEX MICROSCOPIC
Bilirubin Urine: NEGATIVE
Glucose, UA: >=500 mg/dL — AB
Hgb urine dipstick: NEGATIVE
Ketones, ur: >80 mg/dL — AB
Leukocytes,Ua: NEGATIVE
Nitrite: NEGATIVE
Protein, ur: NEGATIVE mg/dL
Specific Gravity, Urine: <1.005 — ABNORMAL LOW (ref 1.005–1.030)
pH: 5.5 (ref 5.0–8.0)

## 2024-08-05 LAB — I-STAT VENOUS BLOOD GAS, ED
Acid-base deficit: 8 mmol/L — ABNORMAL HIGH (ref 0.0–2.0)
Bicarbonate: 16.2 mmol/L — ABNORMAL LOW (ref 20.0–28.0)
Calcium, Ion: 1.13 mmol/L — ABNORMAL LOW (ref 1.15–1.40)
HCT: 49 % (ref 39.0–52.0)
Hemoglobin: 16.7 g/dL (ref 13.0–17.0)
O2 Saturation: 93 %
Potassium: 6.4 mmol/L (ref 3.5–5.1)
Sodium: 112 mmol/L — CL (ref 135–145)
TCO2: 17 mmol/L — ABNORMAL LOW (ref 22–32)
pCO2, Ven: 31.1 mmHg — ABNORMAL LOW (ref 44–60)
pH, Ven: 7.323 (ref 7.25–7.43)
pO2, Ven: 70 mmHg — ABNORMAL HIGH (ref 32–45)

## 2024-08-05 LAB — LIPASE, BLOOD: Lipase: 67 U/L — ABNORMAL HIGH (ref 11–51)

## 2024-08-05 LAB — CBG MONITORING, ED
Glucose-Capillary: 135 mg/dL — ABNORMAL HIGH (ref 70–99)
Glucose-Capillary: 138 mg/dL — ABNORMAL HIGH (ref 70–99)
Glucose-Capillary: 167 mg/dL — ABNORMAL HIGH (ref 70–99)
Glucose-Capillary: 184 mg/dL — ABNORMAL HIGH (ref 70–99)
Glucose-Capillary: 206 mg/dL — ABNORMAL HIGH (ref 70–99)
Glucose-Capillary: 206 mg/dL — ABNORMAL HIGH (ref 70–99)
Glucose-Capillary: 220 mg/dL — ABNORMAL HIGH (ref 70–99)
Glucose-Capillary: 223 mg/dL — ABNORMAL HIGH (ref 70–99)
Glucose-Capillary: 240 mg/dL — ABNORMAL HIGH (ref 70–99)
Glucose-Capillary: 251 mg/dL — ABNORMAL HIGH (ref 70–99)
Glucose-Capillary: 273 mg/dL — ABNORMAL HIGH (ref 70–99)
Glucose-Capillary: 274 mg/dL — ABNORMAL HIGH (ref 70–99)
Glucose-Capillary: 284 mg/dL — ABNORMAL HIGH (ref 70–99)
Glucose-Capillary: 293 mg/dL — ABNORMAL HIGH (ref 70–99)
Glucose-Capillary: 304 mg/dL — ABNORMAL HIGH (ref 70–99)
Glucose-Capillary: 372 mg/dL — ABNORMAL HIGH (ref 70–99)
Glucose-Capillary: 383 mg/dL — ABNORMAL HIGH (ref 70–99)
Glucose-Capillary: 385 mg/dL — ABNORMAL HIGH (ref 70–99)
Glucose-Capillary: 500 mg/dL — ABNORMAL HIGH (ref 70–99)
Glucose-Capillary: >600 mg/dL (ref 70–99)
Glucose-Capillary: >600 mg/dL (ref 70–99)
Glucose-Capillary: >600 mg/dL (ref 70–99)

## 2024-08-05 LAB — OSMOLALITY
Osmolality: 316 mosm/kg — ABNORMAL HIGH (ref 275–295)
Osmolality: 294 mosm/kg (ref 275–295)

## 2024-08-05 LAB — HEPATIC FUNCTION PANEL
ALT: 15 U/L (ref 0–44)
AST: 10 U/L — ABNORMAL LOW (ref 15–41)
Albumin: 4.5 g/dL (ref 3.5–5.0)
Alkaline Phosphatase: 121 U/L (ref 38–126)
Bilirubin, Direct: 0.2 mg/dL (ref 0.0–0.2)
Indirect Bilirubin: 0.3 mg/dL (ref 0.3–0.9)
Total Bilirubin: 0.5 mg/dL (ref 0.0–1.2)
Total Protein: 7.9 g/dL (ref 6.5–8.1)

## 2024-08-05 LAB — URINALYSIS, MICROSCOPIC (REFLEX)

## 2024-08-05 LAB — BETA-HYDROXYBUTYRIC ACID: Beta-Hydroxybutyric Acid: >8 mmol/L — ABNORMAL HIGH (ref 0.05–0.27)

## 2024-08-05 LAB — MAGNESIUM: Magnesium: 2.3 mg/dL (ref 1.7–2.4)

## 2024-08-05 MED ORDER — LACTATED RINGERS IV BOLUS
20.0000 mL/kg | Freq: Once | INTRAVENOUS | Status: AC
  Administered 2024-08-05: 02:00:00 2040 mL via INTRAVENOUS

## 2024-08-05 MED ORDER — DEXTROSE 50 % IV SOLN: 0.0000 mL | INTRAVENOUS | Status: DC | PRN

## 2024-08-05 MED ORDER — HYDROMORPHONE HCL 1 MG/ML IJ SOLN
0.5000 mg | INTRAMUSCULAR | Status: DC | PRN
  Administered 2024-08-05: 22:00:00 0.5 mg via INTRAVENOUS
  Filled 2024-08-05: qty 1

## 2024-08-05 MED ORDER — ENOXAPARIN SODIUM 40 MG/0.4ML IJ SOSY
40.0000 mg | PREFILLED_SYRINGE | Freq: Every day | INTRAMUSCULAR | Status: DC
  Administered 2024-08-05 – 2024-08-07 (×3): 40 mg via SUBCUTANEOUS
  Filled 2024-08-05 (×3): qty 0.4

## 2024-08-05 MED ORDER — INSULIN REGULAR(HUMAN) IN NACL 100-0.9 UT/100ML-% IV SOLN
INTRAVENOUS | Status: DC
  Administered 2024-08-05: 02:00:00 15 [IU]/h via INTRAVENOUS
  Administered 2024-08-05: 10:00:00 6.5 [IU]/h via INTRAVENOUS
  Administered 2024-08-05: 13:00:00 1.9 [IU]/h via INTRAVENOUS
  Administered 2024-08-05: 19:00:00 15 [IU]/h via INTRAVENOUS
  Administered 2024-08-06: 01:00:00 13 [IU]/h via INTRAVENOUS
  Administered 2024-08-06: 10:00:00 2.6 [IU]/h via INTRAVENOUS
  Filled 2024-08-05 (×6): qty 100

## 2024-08-05 MED ORDER — LACTATED RINGERS IV BOLUS: 1000.0000 mL | Freq: Once | INTRAVENOUS | Status: DC

## 2024-08-05 MED ORDER — LACTATED RINGERS IV SOLN: INTRAVENOUS | Status: AC

## 2024-08-05 MED ORDER — ONDANSETRON HCL 4 MG/2ML IJ SOLN: 4.0000 mg | Freq: Once | INTRAMUSCULAR | Status: AC

## 2024-08-05 MED ORDER — DEXTROSE IN LACTATED RINGERS 5 % IV SOLN: INTRAVENOUS | Status: AC

## 2024-08-05 MED ORDER — ONDANSETRON HCL 4 MG/2ML IJ SOLN
INTRAMUSCULAR | Status: AC
  Administered 2024-08-05: 02:00:00 4 mg via INTRAVENOUS
  Filled 2024-08-05: qty 2

## 2024-08-05 NOTE — H&P (Signed)
 History and Physical    Ryan Bartlett FMW:[id number] DOB: September 18, 1995 DOA: 08/05/2024  PCP: Patient, No Pcp Per   Patient coming from: Home   Chief Complaint: Polyuria, polydipsia, N/V   HPI: Ryan Bartlett is a 28 y.o. male with medical history significant for schizoaffective disorder, polysubstance abuse, and recently diagnosed diabetes mellitus who presents with polyuria, polydipsia, nausea, and vomiting.  Patient reports that he was using cocaine a couple days ago and then developed polyuria, polydipsia, and then nausea with recurrent bouts of vomiting.  He has had upper abdominal pain associated with this but denies fevers, chills, or diarrhea.  He reports adherence with his medications.  ED Course: Upon arrival to the ED, patient is found to be afebrile and saturating well on room air with mild tachypnea, mild tachycardia, and stable BP.  Labs are most notable for glucose >700 with potassium 6.4, serum bicarbonate 16, creatinine 1.40, ketonuria, and mild leukocytosis.  Patient was treated in the ED with 3 L LR and insulin  infusion.  Review of Systems:  All other systems reviewed and apart from HPI, are negative.  Past Medical History:  Diagnosis Date   ADHD (attention deficit hyperactivity disorder)    Arthritis    Asthma    Bipolar disorder (HCC)    Depression    Oppositional defiant disorder    PTSD (post-traumatic stress disorder)    Schizoaffective disorder (HCC)    Unspecified episodic mood disorder     Past Surgical History:  Procedure Laterality Date   NO PAST SURGERIES      Social History:   reports that he has been smoking cigarettes. He has never used smokeless tobacco. He reports current alcohol use. He reports current drug use. Drugs: Marijuana, Benzodiazepines, and Amphetamines.  Allergies[1]  Family History  Problem Relation Age of Onset   Depression Mother    Diabetes Other    Hyperlipidemia Other    Hypertension Other      Prior to  Admission medications  Medication Sig Start Date End Date Taking? Authorizing Provider  Accu-Chek Softclix Lancets lancets Use as directed. Dispense based on patient and insurance preference. Use up to four times daily as directed. (FOR ICD-10 E10.9, E11.9). 07/15/24   Arlice Reichert, MD  acetaminophen  (TYLENOL ) 325 MG tablet Take 2 tablets (650 mg total) by mouth every 6 (six) hours as needed for mild pain (pain score 1-3). 07/15/24   Arlice Reichert, MD  ARISTADA  [phone] MG/3.9ML prefilled syringe SMARTSIG:106 Milligram(s) IM Every 4 Weeks 04/01/24   [provider]  Blood Glucose Monitoring Suppl (BLOOD GLUCOSE MONITOR SYSTEM) w/Device KIT Use as directed. Dispense based on patient and insurance preference. Use up to four times daily as directed. (FOR ICD-10 E10.9, E11.9). 07/15/24   Arlice Reichert, MD  Glucagon  (GVOKE HYPOPEN  2-PACK) 1 MG/0.2ML SOAJ Inject 1 mg into the skin as needed for up to 2 doses (Severe low blood sugar). 07/15/24   Arlice Reichert, MD  Glucose Blood (BLOOD GLUCOSE TEST STRIPS) STRP Use as directed. Dispense based on patient and insurance preference. Use up to four times daily as directed. (FOR ICD-10 E10.9, E11.9). 07/15/24   Arlice Reichert, MD  insulin  aspart (NOVOLOG ) 100 UNIT/ML FlexPen Inject 0-6 Units into the skin 3 (three) times daily with meals. Check Blood Glucose (BG) and inject per scale: BG <150= 0 unit; BG 150-200= 1 unit; BG 201-250= 2 unit; BG 251-300= 3 unit; BG 301-350= 4 unit; BG 351-400= 5 unit; BG >400= 6 unit and Call Primary Care.  07/15/24   Arlice Reichert, MD  insulin  glargine (LANTUS ) 100 UNIT/ML Solostar Pen Inject 20 Units into the skin 2 (two) times daily. May substitute as needed per insurance. 07/15/24   Arlice Reichert, MD  Insulin  Pen Needle (PEN NEEDLES) 31G X 5 MM MISC 1 each by Does not apply route as directed. Dispense based on patient and insurance preference. Use up to four times daily as directed. (FOR ICD-10 E10.9, E11.9). 07/15/24   Arlice Reichert, MD  Insulin  Pen Needle 32G X 4 MM MISC Use as directed. Dispense based on patient and insurance preference. Use up to four times daily as directed. (FOR ICD-10 E10.9, E11.9). 07/15/24   Arlice Reichert, MD  Lancet Device MISC Use as directed. Dispense based on patient and insurance preference. Use up to four times daily as directed. (FOR ICD-10 E10.9, E11.9). 07/15/24   Arlice Reichert, MD  metFORMIN  (GLUCOPHAGE -XR) 500 MG 24 hr tablet Take 1 tablet (500 mg total) by mouth 2 (two) times daily. 07/15/24 08/14/24  Arlice Reichert, MD  OLANZapine  zydis (ZYPREXA ) 5 MG disintegrating tablet Take 1 tablet (5 mg total) by mouth 2 (two) times daily for 7 days. 07/15/24 07/22/24  Arlice Reichert, MD  albuterol  (PROVENTIL  HFA;VENTOLIN  HFA) 108 (90 Base) MCG/ACT inhaler Inhale 1-2 puffs into the lungs every 4 (four) hours as needed for wheezing or shortness of breath. 04/11/18 03/11/20  Collene Gouge I, NP  divalproex  (DEPAKOTE ) 500 MG DR tablet Take 1 tablet (500 mg total) by mouth every 12 (twelve) hours. For mood stabilization Patient not taking: Reported on 10/03/2019 04/11/18 03/11/20  Collene Gouge I, NP  omeprazole  (PRILOSEC) 20 MG capsule Take 1 capsule (20 mg total) by mouth daily. Patient not taking: Reported on 10/03/2019 12/14/18 03/11/20  Carita Senior, MD    Physical Exam: Vitals:   08/05/24 0047 08/05/24 0102 08/05/24 0350  BP:  (!) 141/96 (!) 126/91  Pulse:  (!) 104 (!) 104  Resp:  (!) 23 17  Temp:  98.2 F (36.8 C)   TempSrc:  Oral   SpO2:  100% 100%  Weight: 102 kg    Height: 5' 9 (1.753 m)       Constitutional: NAD, no pallor or diaphoresis   Eyes: PERTLA, lids and conjunctivae normal ENMT: Mucous membranes are moist. Posterior pharynx clear of any exudate or lesions.   Neck: supple, no masses  Respiratory: no wheezing, no crackles. No accessory muscle use.  Cardiovascular: S1 & S2 heard, regular rate and rhythm. No extremity edema.  Abdomen: Soft, tender in epigastrium, no  guarding. Bowel sounds active.  Musculoskeletal: no clubbing / cyanosis. No joint deformity upper and lower extremities.   Skin: no significant rashes, lesions, ulcers. Warm, dry, well-perfused. Neurologic: CN 2-12 grossly intact. Moving all extremities. Alert and oriented.  Psychiatric: Restless. Intermittently cooperative.    Labs and Imaging on Admission: I have personally reviewed following labs and imaging studies  CBC: Recent Labs  Lab 08/05/24 0058 08/05/24 0111  WBC 10.6*  --   NEUTROABS 7.5  --   HGB 15.3 16.7  16.7  HCT 44.1 49.0  49.0  MCV 81.8  --   PLT 347  --    Basic Metabolic Panel: Recent Labs  Lab 08/05/24 0111 08/05/24 0235  NA 112*  114* 118*  K 6.4*  6.4* 5.2*  CL 87* 80*  CO2  --  11*  GLUCOSE >700* 776*  BUN 29* 24*  CREATININE 1.40* 1.50*  CALCIUM   --  9.8   GFR:  Estimated Creatinine Clearance: 84.7 mL/min (A) (by C-G formula based on SCr of 1.5 mg/dL (H)). Liver Function Tests: Recent Labs  Lab 08/05/24 0235  AST 10*  ALT 15  ALKPHOS 121  BILITOT 0.5  PROT 7.9  ALBUMIN 4.5   Recent Labs  Lab 08/05/24 0235  LIPASE 67*   No results for input(s): AMMONIA in the last 168 hours. Coagulation Profile: No results for input(s): INR, PROTIME in the last 168 hours. Cardiac Enzymes: No results for input(s): CKTOTAL, CKMB, CKMBINDEX, TROPONINI in the last 168 hours. BNP (last 3 results) No results for input(s): PROBNP in the last 8760 hours. HbA1C: No results for input(s): HGBA1C in the last 72 hours. CBG: Recent Labs  Lab 08/05/24 0104 08/05/24 0221 08/05/24 0307 08/05/24 0347 08/05/24 0432  GLUCAP >600* >600* >600* 500* 383*   Lipid Profile: No results for input(s): CHOL, HDL, LDLCALC, TRIG, CHOLHDL, LDLDIRECT in the last 72 hours. Thyroid  Function Tests: No results for input(s): TSH, T4TOTAL, FREET4, T3FREE, THYROIDAB in the last 72 hours. Anemia Panel: No results for input(s):  VITAMINB12, FOLATE, FERRITIN, TIBC, IRON, RETICCTPCT in the last 72 hours. Urine analysis:    Component Value Date/Time   COLORURINE YELLOW 08/04/2024 0055   APPEARANCEUR CLEAR 08/04/2024 0055   LABSPEC <1.005 (L) 08/04/2024 0055   PHURINE 5.5 08/04/2024 0055   GLUCOSEU >=500 (A) 08/04/2024 0055   HGBUR NEGATIVE 08/04/2024 0055   BILIRUBINUR NEGATIVE 08/04/2024 0055   KETONESUR >80 (A) 08/04/2024 0055   PROTEINUR NEGATIVE 08/04/2024 0055   UROBILINOGEN 2.0 (H) 01/11/2013 2121   NITRITE NEGATIVE 08/04/2024 0055   LEUKOCYTESUR NEGATIVE 08/04/2024 0055   Sepsis Labs: @LABRCNTIP (procalcitonin:4,lacticidven:4) )No results found for this or any previous visit (from the past 240 hours).   Radiological Exams on Admission: DG Abd 1 View Result Date: 08/05/2024 EXAM: XR ABDOMEN INTRAOPERATIVE COUNT 1 VIEW(S) XRAY OF THE ABDOMEN 08/05/2024 03:07:57 AM COMPARISON: None available. CLINICAL HISTORY: Abdominal pain FINDINGS: No unexpected retained surgical items. IMPRESSION: 1. No acute findings. Electronically signed by: Dorethia Molt MD 08/05/2024 03:11 AM EST RP Workstation: [id number]    EKG: Independently reviewed. Sinus tachycardia, rate 103.   Assessment/Plan   1. DKA  - A1c was 12.1% in November 2025  - Presents with polyuria, polydipsia, and N/V and is found to be in DKA  - He reports adherence with medications but was using cocaine shortly prior to symptom-onset and this may have been the trigger  - Continue IVF hydration and insulin  infusion with frequent CBGs and serial chem panels, consult diabetes educator    2. Hyperkalemia  - Potassium elevated in setting of insulin  deficiency, anticipate resolution with IVF and IV insulin , will continue cardiac monitoring and serial chem panels    3. Polysubstance abuse  - Denies recent alcohol use but admits to using cocaine shortly prior to developing his current symptoms  - Counseled    4. Schizoaffective disorder  -  Managed with Aristada  injections, last dose was 07/14/24    DVT prophylaxis: Lovenox   Code Status: Full  Level of Care: Level of care: Progressive Family Communication: None present   Disposition Plan:  Patient is from: Home  Anticipated d/c is to: Home  Anticipated d/c date is: 08/07/24  Patient currently: Pending glycemic-control  Consults called: None  Admission status: Inpatient     Evalene GORMAN Sprinkles, MD [facility]  08/05/2024, 5:02 AM       [1]  Allergies Allergen Reactions   Ativan  [Lorazepam ] Anxiety    Worsening Anxiety/Agitation  Peanut (Diagnostic) Anaphylaxis   Lactose Intolerance (Gi) Diarrhea and Nausea And Vomiting

## 2024-08-05 NOTE — ED Notes (Signed)
 BNP hemolyzed per lab. QTC 0.45 per CCMD. Pt BG 385. Insulin  drip increased per protocol. Provider made aware of preceding information.

## 2024-08-05 NOTE — ED Triage Notes (Addendum)
 Pt arrive by EMS from home recently diagnose with DM, reports increase urination, been more thirsty than usual and increase vomiting for x 2days. CBG for EMS 340

## 2024-08-05 NOTE — Progress Notes (Signed)
 Courtesy Visit- No billing Patient is seen and examined today morning. He is admitted for DKA, hyperkalemia currently on insulin  drip, IV fluids.  Patient is sleepy and lethargic, wishes to eat.  No abdominal pain, nausea or vomiting.  Patient's anion gap still elevated, discussed with him the need for repeat labs and once anion gap is closed we will give him diet.  Plan: Continue current insulin  drip as per Endo tool.  Repeat BMP along with mag and Phos ordered.  Continue IV fluids, pain medications.  Continue to monitor telemetry, fall, aspiration precautions.  Diabetes educator on board.  He will need further diabetes education as she is new diabetic, last admission for DKA a month ago.  Further management pending clinical course.

## 2024-08-05 NOTE — ED Provider Notes (Signed)
 [place] EMERGENCY DEPARTMENT AT [facility] Provider Note   CSN: [id number] Arrival date & time: 08/05/24  9956     Patient presents with: Emesis   Ryan Bartlett is a 28 y.o. male.   28 year old male presents for evaluation of hyperglycemia and vomiting.  He was recently diagnosed with diabetes about a week ago states he has been using twice a day long acting insulin  but has not been checking his blood sugar.  States since today he has had some vomiting, and over the last few days increased thirst and urination.  Denies any other symptoms or concerns.   Emesis Associated symptoms: no abdominal pain, no arthralgias, no chills, no cough, no fever and no sore throat        Prior to Admission medications  Medication Sig Start Date End Date Taking? Authorizing Provider  Accu-Chek Softclix Lancets lancets Use as directed. Dispense based on patient and insurance preference. Use up to four times daily as directed. (FOR ICD-10 E10.9, E11.9). 07/15/24   Arlice Reichert, MD  acetaminophen  (TYLENOL ) 325 MG tablet Take 2 tablets (650 mg total) by mouth every 6 (six) hours as needed for mild pain (pain score 1-3). 07/15/24   Arlice Reichert, MD  ARISTADA  [phone] MG/3.9ML prefilled syringe SMARTSIG:106 Milligram(s) IM Every 4 Weeks 04/01/24   [provider]  Blood Glucose Monitoring Suppl (BLOOD GLUCOSE MONITOR SYSTEM) w/Device KIT Use as directed. Dispense based on patient and insurance preference. Use up to four times daily as directed. (FOR ICD-10 E10.9, E11.9). 07/15/24   Arlice Reichert, MD  Glucagon  (GVOKE HYPOPEN  2-PACK) 1 MG/0.2ML SOAJ Inject 1 mg into the skin as needed for up to 2 doses (Severe low blood sugar). 07/15/24   Arlice Reichert, MD  Glucose Blood (BLOOD GLUCOSE TEST STRIPS) STRP Use as directed. Dispense based on patient and insurance preference. Use up to four times daily as directed. (FOR ICD-10 E10.9, E11.9). 07/15/24   Arlice Reichert, MD  insulin  aspart  (NOVOLOG ) 100 UNIT/ML FlexPen Inject 0-6 Units into the skin 3 (three) times daily with meals. Check Blood Glucose (BG) and inject per scale: BG <150= 0 unit; BG 150-200= 1 unit; BG 201-250= 2 unit; BG 251-300= 3 unit; BG 301-350= 4 unit; BG 351-400= 5 unit; BG >400= 6 unit and Call Primary Care. 07/15/24   Arlice Reichert, MD  insulin  glargine (LANTUS ) 100 UNIT/ML Solostar Pen Inject 20 Units into the skin 2 (two) times daily. May substitute as needed per insurance. 07/15/24   Arlice Reichert, MD  Insulin  Pen Needle (PEN NEEDLES) 31G X 5 MM MISC 1 each by Does not apply route as directed. Dispense based on patient and insurance preference. Use up to four times daily as directed. (FOR ICD-10 E10.9, E11.9). 07/15/24   Arlice Reichert, MD  Insulin  Pen Needle 32G X 4 MM MISC Use as directed. Dispense based on patient and insurance preference. Use up to four times daily as directed. (FOR ICD-10 E10.9, E11.9). 07/15/24   Arlice Reichert, MD  Lancet Device MISC Use as directed. Dispense based on patient and insurance preference. Use up to four times daily as directed. (FOR ICD-10 E10.9, E11.9). 07/15/24   Arlice Reichert, MD  metFORMIN  (GLUCOPHAGE -XR) 500 MG 24 hr tablet Take 1 tablet (500 mg total) by mouth 2 (two) times daily. 07/15/24 08/14/24  Arlice Reichert, MD  OLANZapine  zydis (ZYPREXA ) 5 MG disintegrating tablet Take 1 tablet (5 mg total) by mouth 2 (two) times daily for 7 days. 07/15/24 07/22/24  Arlice Reichert,  MD  albuterol  (PROVENTIL  HFA;VENTOLIN  HFA) 108 (90 Base) MCG/ACT inhaler Inhale 1-2 puffs into the lungs every 4 (four) hours as needed for wheezing or shortness of breath. 04/11/18 03/11/20  Collene Gouge I, NP  divalproex  (DEPAKOTE ) 500 MG DR tablet Take 1 tablet (500 mg total) by mouth every 12 (twelve) hours. For mood stabilization Patient not taking: Reported on 10/03/2019 04/11/18 03/11/20  Collene Gouge I, NP  omeprazole  (PRILOSEC) 20 MG capsule Take 1 capsule (20 mg total) by mouth daily. Patient not  taking: Reported on 10/03/2019 12/14/18 03/11/20  Carita Senior, MD    Allergies: Ativan  [lorazepam ], Peanut (diagnostic), and Lactose intolerance (gi)    Review of Systems  Constitutional:  Negative for chills and fever.  HENT:  Negative for ear pain and sore throat.   Eyes:  Negative for pain and visual disturbance.  Respiratory:  Negative for cough and shortness of breath.   Cardiovascular:  Negative for chest pain and palpitations.  Gastrointestinal:  Positive for nausea and vomiting. Negative for abdominal pain.  Endocrine: Positive for polydipsia and polyuria.  Genitourinary:  Negative for dysuria and hematuria.  Musculoskeletal:  Negative for arthralgias and back pain.  Skin:  Negative for color change and rash.  Neurological:  Negative for seizures and syncope.  All other systems reviewed and are negative.   Updated Vital Signs BP (!) 133/92   Pulse (!) 103   Temp 98.1 F (36.7 C) (Oral)   Resp 14   Ht 5' 9 (1.753 m)   Wt 102 kg   SpO2 100%   BMI 33.21 kg/m   Physical Exam Vitals and nursing note reviewed.  Constitutional:      General: He is not in acute distress.    Appearance: He is well-developed. He is ill-appearing.  HENT:     Head: Normocephalic and atraumatic.     Mouth/Throat:     Mouth: Mucous membranes are dry.  Eyes:     Conjunctiva/sclera: Conjunctivae normal.  Cardiovascular:     Rate and Rhythm: Regular rhythm. Tachycardia present.     Heart sounds: No murmur heard. Pulmonary:     Effort: Pulmonary effort is normal. No respiratory distress.     Breath sounds: Normal breath sounds.  Abdominal:     Palpations: Abdomen is soft.     Tenderness: There is no abdominal tenderness.  Musculoskeletal:        General: No swelling.     Cervical back: Neck supple.  Skin:    General: Skin is warm and dry.     Capillary Refill: Capillary refill takes less than 2 seconds.  Neurological:     Mental Status: He is alert.  Psychiatric:        Mood and  Affect: Mood normal.     (all labs ordered are listed, but only abnormal results are displayed) Labs Reviewed  CBC WITH DIFFERENTIAL/PLATELET - Abnormal; Notable for the following components:      Result Value   WBC 10.6 (*)    Monocytes Absolute 1.2 (*)    All other components within normal limits  URINALYSIS, ROUTINE W REFLEX MICROSCOPIC - Abnormal; Notable for the following components:   Specific Gravity, Urine <1.005 (*)    Glucose, UA >=500 (*)    Ketones, ur >80 (*)    All other components within normal limits  BETA-HYDROXYBUTYRIC ACID - Abnormal; Notable for the following components:   Beta-Hydroxybutyric Acid >8.00 (*)    All other components within normal limits  URINALYSIS, MICROSCOPIC (REFLEX) -  Abnormal; Notable for the following components:   Bacteria, UA RARE (*)    All other components within normal limits  BASIC METABOLIC PANEL WITH GFR - Abnormal; Notable for the following components:   Sodium 118 (*)    Potassium 5.2 (*)    Chloride 80 (*)    CO2 11 (*)    Glucose, Bld 776 (*)    BUN 24 (*)    Creatinine, Ser 1.50 (*)    Anion gap 27 (*)    All other components within normal limits  HEPATIC FUNCTION PANEL - Abnormal; Notable for the following components:   AST 10 (*)    All other components within normal limits  LIPASE, BLOOD - Abnormal; Notable for the following components:   Lipase 67 (*)    All other components within normal limits  OSMOLALITY - Abnormal; Notable for the following components:   Osmolality 316 (*)    All other components within normal limits  I-STAT CHEM 8, ED - Abnormal; Notable for the following components:   Sodium 114 (*)    Potassium 6.4 (*)    Chloride 87 (*)    BUN 29 (*)    Creatinine, Ser 1.40 (*)    Glucose, Bld >700 (*)    Calcium , Ion 1.13 (*)    TCO2 16 (*)    All other components within normal limits  I-STAT VENOUS BLOOD GAS, ED - Abnormal; Notable for the following components:   pCO2, Ven 31.1 (*)    pO2, Ven 70  (*)    Bicarbonate 16.2 (*)    TCO2 17 (*)    Acid-base deficit 8.0 (*)    Sodium 112 (*)    Potassium 6.4 (*)    Calcium , Ion 1.13 (*)    All other components within normal limits  CBG MONITORING, ED - Abnormal; Notable for the following components:   Glucose-Capillary >600 (*)    All other components within normal limits  CBG MONITORING, ED - Abnormal; Notable for the following components:   Glucose-Capillary >600 (*)    All other components within normal limits  CBG MONITORING, ED - Abnormal; Notable for the following components:   Glucose-Capillary >600 (*)    All other components within normal limits  CBG MONITORING, ED - Abnormal; Notable for the following components:   Glucose-Capillary 500 (*)    All other components within normal limits  CBG MONITORING, ED - Abnormal; Notable for the following components:   Glucose-Capillary 383 (*)    All other components within normal limits  CBG MONITORING, ED - Abnormal; Notable for the following components:   Glucose-Capillary 304 (*)    All other components within normal limits  CBG MONITORING, ED - Abnormal; Notable for the following components:   Glucose-Capillary 293 (*)    All other components within normal limits  CBG MONITORING, ED - Abnormal; Notable for the following components:   Glucose-Capillary 251 (*)    All other components within normal limits  URINE DRUG SCREEN  BASIC METABOLIC PANEL WITH GFR  BASIC METABOLIC PANEL WITH GFR  BASIC METABOLIC PANEL WITH GFR  BASIC METABOLIC PANEL WITH GFR  BASIC METABOLIC PANEL WITH GFR    EKG: EKG Interpretation Date/Time:  Wednesday August 05 2024 01:05:41 EST Ventricular Rate:  103 PR Interval:  145 QRS Duration:  91 QT Interval:  402 QTC Calculation: 527 R Axis:   28  Text Interpretation: Sinus tachycardia Prolonged QT interval Compared with prior EKG from 07/14/2024 Confirmed by Gennaro Bouchard (  [id number]) on 08/05/2024 1:15:06 AM  Radiology: [facility] Abd 1 View Result  Date: 08/05/2024 EXAM: XR ABDOMEN INTRAOPERATIVE COUNT 1 VIEW(S) XRAY OF THE ABDOMEN 08/05/2024 03:07:57 AM COMPARISON: None available. CLINICAL HISTORY: Abdominal pain FINDINGS: No unexpected retained surgical items. IMPRESSION: 1. No acute findings. Electronically signed by: Dorethia Molt MD 08/05/2024 03:11 AM EST RP Workstation: [id number]     .Critical Care  Performed by: Gennaro Duwaine CROME, DO Authorized by: Gennaro Duwaine CROME, DO   Critical care provider statement:    Critical care time (minutes):  35   Critical care time was exclusive of:  Separately billable procedures and treating other patients and teaching time   Critical care was necessary to treat or prevent imminent or life-threatening deterioration of the following conditions:  Endocrine crisis and dehydration   Critical care was time spent personally by me on the following activities:  Development of treatment plan with patient or surrogate, discussions with consultants, evaluation of patient's response to treatment, examination of patient, ordering and review of laboratory studies, ordering and review of radiographic studies, ordering and performing treatments and interventions, pulse oximetry, re-evaluation of patient's condition and review of old charts   Care discussed with: admitting provider      Medications Ordered in the ED  lactated ringers  bolus 1,000 mL (1,000 mLs Intravenous Not Given 08/05/24 0139)  insulin  regular, human (MYXREDLIN ) 100 units/ 100 mL infusion (10 Units/hr Intravenous Rate/Dose Change 08/05/24 0546)  lactated ringers  infusion ( Intravenous New Bag/Given 08/05/24 0354)  dextrose  5 % in lactated ringers  infusion (has no administration in time range)  dextrose  50 % solution 0-50 mL (has no administration in time range)  HYDROmorphone  (DILAUDID ) injection 0.5 mg (has no administration in time range)  enoxaparin  (LOVENOX ) injection 40 mg (has no administration in time range)  lactated ringers  bolus  2,040 mL (0 mLs Intravenous Stopped 08/05/24 0353)  ondansetron  (ZOFRAN ) injection 4 mg (4 mg Intravenous Given 08/05/24 0226)                                    Medical Decision Making Cardiac monitor interpretation: Sinus tachycardia, no ectopy  Patient for nausea vomiting and found to be in DKA.  Given multiple liters of IV fluids and started on insulin  drip as well as given Zofran .  Found to have pseudohyponatremia as well as hyperkalemia.  Will continue to closely monitor the potassium for now, but will not treated due to the hyperglycemia.  Patient's case discussed with hospitalist patient to be admitted for further workup and management.  On reevaluation he is feeling much improved.  He is agreeable to plan for admission.  Problems Addressed: Diabetic ketoacidosis without coma associated with type 1 diabetes mellitus (HCC): acute illness or injury that poses a threat to life or bodily functions Vomiting without nausea, unspecified vomiting type: acute illness or injury  Amount and/or Complexity of Data Reviewed External Data Reviewed: notes.    Details: Prior hospital records reviewed and patient was admitted 07-08-2024 for hypokalemia new onset diabetes Labs: ordered. Decision-making details documented in ED Course.    Details: Ordered and reviewed by me patient with DKA, pseudohyponatremia hyperkalemia ECG/medicine tests: ordered and independent interpretation performed. Decision-making details documented in ED Course.    Details: Ordered and interpreted by me in the absence of cardiology and shows sinus rhythm, no STEMI, or significant change when compared to prior EKG Discussion of management or test interpretation with  external provider(s): Dr. Charlton - hospitalist -I spoke with him on the phone regarding patient's case and he will admit the patient for further workup and management  Risk OTC drugs. Prescription drug management. Drug therapy requiring intensive monitoring for  toxicity. Decision regarding hospitalization. Risk Details: CRITICAL CARE Performed by: Duwaine LITTIE Fusi   Total critical care time: 35 minutes  Critical care time was exclusive of separately billable procedures and treating other patients.  Critical care was necessary to treat or prevent imminent or life-threatening deterioration.  Critical care was time spent personally by me on the following activities: development of treatment plan with patient and/or surrogate as well as nursing, discussions with consultants, evaluation of patient's response to treatment, examination of patient, obtaining history from patient or surrogate, ordering and performing treatments and interventions, ordering and review of laboratory studies, ordering and review of radiographic studies, pulse oximetry and re-evaluation of patient's condition.   Critical Care Total time providing critical care: 35 minutes     Final diagnoses:  Diabetic ketoacidosis without coma associated with type 1 diabetes mellitus (HCC)  Vomiting without nausea, unspecified vomiting type    ED Discharge Orders     None          Fusi Duwaine LITTIE, DO 08/05/24 9345

## 2024-08-05 NOTE — ED Notes (Signed)
 Pt given diet cola shasta.

## 2024-08-05 NOTE — ED Notes (Signed)
 Writer contacted main lab to add on phos and mag to current draw.

## 2024-08-05 NOTE — Inpatient Diabetes Management (Addendum)
 Inpatient Diabetes Program Recommendations  AACE/ADA: New Consensus Statement on Inpatient Glycemic Control (2015)  Target Ranges:  Prepandial:   less than 140 mg/dL      Peak postprandial:   less than 180 mg/dL (1-2 hours)      Critically ill patients:  140 - 180 mg/dL    Latest Reference Range & Units 07/09/24 04:40  Hemoglobin A1C 4.8 - 5.6 % 12.1 (H)  300 mg/dl  (H): Data is abnormally high  Latest Reference Range & Units 08/05/24 02:35  Beta-Hydroxybutyric Acid 0.05 - 0.27 mmol/L >8.00 (H)  (H): Data is abnormally high  Latest Reference Range & Units 08/05/24 02:35  Sodium 135 - 145 mmol/L 118 (LL)  Potassium 3.5 - 5.1 mmol/L 5.2 (H)  Chloride 98 - 111 mmol/L 80 (L)  CO2 22 - 32 mmol/L 11 (L)  Glucose 70 - 99 mg/dL 223 (HH)  BUN 6 - 20 mg/dL 24 (H)  Creatinine 9.38 - 1.24 mg/dL 8.49 (H)  Calcium  8.9 - 10.3 mg/dL 9.8  Anion gap 5 - 15  27 (H)    Latest Reference Range & Units 08/05/24 01:04 08/05/24 02:21 08/05/24 03:07 08/05/24 03:47 08/05/24 04:32 08/05/24 05:31 08/05/24 05:43 08/05/24 06:53  Glucose-Capillary 70 - 99 mg/dL >399 (HH)  IV Insuli nDrip Started @0148  >600 (HH) >600 (HH) 500 (H) 383 (H) 304 (H) 293 (H) 251 (H)  IV Insulin  Drip Running     Admit with: DKA  History: DM (just diagnosed Nov 2025), Polysubstance Abuse (used Cocaine PTA), Schizoaffective d/o  Home DM Meds: Novolog  0-6 units TID per SSI         Lantus  20 units BID       Metformin  500 mg BID  Current Orders: IV Insulin  Drip   MD- Please leave pt on the IV Insulin  Drip until BHB, Anion Gap, and CO2 levels are WNL   Addendum 12:45pm--went by to see pt in the ED.  Pt was asleep but awoke to my voice.  He had a hard time staying awake to have a conversation with me.  I was able to ask him about checking CBGs and his insulin  at home.  Not sure if the information he provided to me was accurate?  States he has been checking CBGs and that the meter reads High.  Not sure how often pt checking  CBGs?  When asked about his insulin  doses, pt stated he takes the insulin  by color.  When further questioned, he stated he takes 20 of the 1 insulin  and 100 of the other insulin .  Unsure if this is accurate as well?  I asked pt if I could call his Mom to give her an update and pt stated yes.   Attempted to call pt's Mom today to review current admission--Unable to reach.  Did not leave VM.  Will re-attempt call tomorrow.   Just admitted from 11/20 thru 11/26 with DKA/ New Onset Diabetes Pt and Mother of pt provided with extensive diabetes education by the Diabetes Team Discharged home on: Novolog  0-6 units TID per SSI   Lantus  20 units BID Metformin  500 mg BID Pt has Medicaid and Rxs are $4   --Will follow patient during hospitalization--  Adina Rudolpho Arrow RN, MSN, CDCES Diabetes Coordinator Inpatient Glycemic Control Team Team Pager: [phone] (8a-5p)

## 2024-08-06 DIAGNOSIS — E1165 Type 2 diabetes mellitus with hyperglycemia: Secondary | ICD-10-CM | POA: Diagnosis not present

## 2024-08-06 DIAGNOSIS — F1910 Other psychoactive substance abuse, uncomplicated: Secondary | ICD-10-CM | POA: Diagnosis not present

## 2024-08-06 DIAGNOSIS — E875 Hyperkalemia: Secondary | ICD-10-CM | POA: Diagnosis not present

## 2024-08-06 DIAGNOSIS — F250 Schizoaffective disorder, bipolar type: Secondary | ICD-10-CM | POA: Diagnosis not present

## 2024-08-06 DIAGNOSIS — Z794 Long term (current) use of insulin: Secondary | ICD-10-CM

## 2024-08-06 DIAGNOSIS — E1110 Type 2 diabetes mellitus with ketoacidosis without coma: Secondary | ICD-10-CM | POA: Diagnosis not present

## 2024-08-06 LAB — GLUCOSE, CAPILLARY
Glucose-Capillary: 135 mg/dL — ABNORMAL HIGH (ref 70–99)
Glucose-Capillary: 138 mg/dL — ABNORMAL HIGH (ref 70–99)
Glucose-Capillary: 141 mg/dL — ABNORMAL HIGH (ref 70–99)
Glucose-Capillary: 145 mg/dL — ABNORMAL HIGH (ref 70–99)
Glucose-Capillary: 147 mg/dL — ABNORMAL HIGH (ref 70–99)
Glucose-Capillary: 156 mg/dL — ABNORMAL HIGH (ref 70–99)
Glucose-Capillary: 166 mg/dL — ABNORMAL HIGH (ref 70–99)
Glucose-Capillary: 174 mg/dL — ABNORMAL HIGH (ref 70–99)
Glucose-Capillary: 201 mg/dL — ABNORMAL HIGH (ref 70–99)
Glucose-Capillary: 208 mg/dL — ABNORMAL HIGH (ref 70–99)
Glucose-Capillary: 214 mg/dL — ABNORMAL HIGH (ref 70–99)
Glucose-Capillary: 220 mg/dL — ABNORMAL HIGH (ref 70–99)
Glucose-Capillary: 221 mg/dL — ABNORMAL HIGH (ref 70–99)
Glucose-Capillary: 230 mg/dL — ABNORMAL HIGH (ref 70–99)
Glucose-Capillary: 241 mg/dL — ABNORMAL HIGH (ref 70–99)
Glucose-Capillary: 245 mg/dL — ABNORMAL HIGH (ref 70–99)
Glucose-Capillary: 253 mg/dL — ABNORMAL HIGH (ref 70–99)

## 2024-08-06 LAB — BASIC METABOLIC PANEL WITH GFR
Anion gap: 9 (ref 5–15)
Anion gap: 9 (ref 5–15)
BUN: 13 mg/dL (ref 6–20)
BUN: 11 mg/dL (ref 6–20)
CO2: 24 mmol/L (ref 22–32)
CO2: 23 mmol/L (ref 22–32)
Calcium: 8.7 mg/dL — ABNORMAL LOW (ref 8.9–10.3)
Calcium: 8.7 mg/dL — ABNORMAL LOW (ref 8.9–10.3)
Chloride: 99 mmol/L (ref 98–111)
Chloride: 100 mmol/L (ref 98–111)
Creatinine, Ser: 0.94 mg/dL (ref 0.61–1.24)
Creatinine, Ser: 1.02 mg/dL (ref 0.61–1.24)
GFR, Estimated: >60 mL/min (ref >60)
GFR, Estimated: >60 mL/min (ref >60)
Glucose, Bld: 148 mg/dL — ABNORMAL HIGH (ref 70–99)
Glucose, Bld: 368 mg/dL — ABNORMAL HIGH (ref 70–99)
Potassium: 3.3 mmol/L — ABNORMAL LOW (ref 3.5–5.1)
Potassium: 3.9 mmol/L (ref 3.5–5.1)
Sodium: 131 mmol/L — ABNORMAL LOW (ref 135–145)
Sodium: 132 mmol/L — ABNORMAL LOW (ref 135–145)

## 2024-08-06 LAB — BETA-HYDROXYBUTYRIC ACID
Beta-Hydroxybutyric Acid: 0.96 mmol/L — ABNORMAL HIGH (ref 0.05–0.27)
Beta-Hydroxybutyric Acid: 0.36 mmol/L — ABNORMAL HIGH (ref 0.05–0.27)

## 2024-08-06 MED ORDER — POTASSIUM CHLORIDE CRYS ER 20 MEQ PO TBCR
40.0000 meq | EXTENDED_RELEASE_TABLET | Freq: Once | ORAL | Status: AC
  Administered 2024-08-06: 06:00:00 40 meq via ORAL
  Filled 2024-08-06: qty 2

## 2024-08-06 MED ORDER — INSULIN ASPART 100 UNIT/ML IJ SOLN: 0.0000 [IU] | Freq: Every day | INTRAMUSCULAR | Status: DC

## 2024-08-06 MED ORDER — LIVING WELL WITH DIABETES BOOK
Freq: Once | Status: AC
  Filled 2024-08-06: qty 1

## 2024-08-06 MED ORDER — INSULIN GLARGINE 100 UNIT/ML ~~LOC~~ SOLN
20.0000 [IU] | Freq: Two times a day (BID) | SUBCUTANEOUS | Status: DC
  Administered 2024-08-06 – 2024-08-07 (×3): 20 [IU] via SUBCUTANEOUS
  Filled 2024-08-06 (×4): qty 0.2

## 2024-08-06 MED ORDER — PNEUMOCOCCAL 20-VAL CONJ VACC 0.5 ML IM SUSY
0.5000 mL | PREFILLED_SYRINGE | INTRAMUSCULAR | Status: DC
  Filled 2024-08-06: qty 0.5

## 2024-08-06 MED ORDER — POTASSIUM CHLORIDE CRYS ER 20 MEQ PO TBCR
40.0000 meq | EXTENDED_RELEASE_TABLET | Freq: Once | ORAL | Status: AC
  Administered 2024-08-06: 01:00:00 40 meq via ORAL
  Filled 2024-08-06: qty 2

## 2024-08-06 MED ORDER — INSULIN ASPART 100 UNIT/ML IJ SOLN
0.0000 [IU] | Freq: Three times a day (TID) | INTRAMUSCULAR | Status: DC
  Administered 2024-08-06: 16:00:00 2 [IU] via SUBCUTANEOUS
  Administered 2024-08-06: 12:00:00 5 [IU] via SUBCUTANEOUS
  Administered 2024-08-07 (×2): 8 [IU] via SUBCUTANEOUS
  Filled 2024-08-06 (×2): qty 1
  Filled 2024-08-06: qty 2
  Filled 2024-08-06: qty 1

## 2024-08-06 MED ORDER — DEXTROSE IN LACTATED RINGERS 5 % IV SOLN: INTRAVENOUS | Status: DC

## 2024-08-06 MED ORDER — INSULIN GLARGINE-YFGN 100 UNIT/ML ~~LOC~~ SOLN: 20.0000 [IU] | Freq: Two times a day (BID) | SUBCUTANEOUS | Status: DC

## 2024-08-06 NOTE — Plan of Care (Signed)

## 2024-08-06 NOTE — Progress Notes (Signed)
 [facility] night cross cover note:   RN notified me that this patient, who is admitted for dka, has arrived on the unit. Upon arrival, it appears that, while insulin  drip is ordered, that insulin  drip is not currently running.   Most recent CBG 167, prompting transition to in ivf's to D5LR at 125 cc/hr. Insulin  drip now running, with repeat cbg 245.   Most recent potassium level 3.2. patient is tolerating po at this time. I have subsequently ordered potassium chloride  40 mill colons p.o. x 1 dose now.  Repeat bmp result pending after sample hemolyzed x 2 occurrences.    Update: bmp has now resulted and is notable for potassium of 3.3, bicarbonate 24, anion gap 9. May be able to begin process of transitioning to subcutaneous insulin .  However, we will first confirm interval improvement in beta-hydroxybutyrate acid.  I have ordered a beta-hydroxybutyrate acid level to be checked at this time.  In light of the above updated potassium level of 3.3, I have ordered an additional potassium chloride  40 meq po x 1 dose now. Repeat bmp and beta-hydroxybutyrate acid levels ordered for 9 AM this morning.      Eva Pore, DO Hospitalist

## 2024-08-06 NOTE — Inpatient Diabetes Management (Signed)
 Inpatient Diabetes Program Recommendations  AACE/ADA: New Consensus Statement on Inpatient Glycemic Control (2015)  Target Ranges:  Prepandial:   less than 140 mg/dL      Peak postprandial:   less than 180 mg/dL (1-2 hours)      Critically ill patients:  140 - 180 mg/dL   Lab Results  Component Value Date   GLUCAP 214 (H) 08/06/2024   HGBA1C 12.1 (H) 07/09/2024    Review of Glycemic Control  Latest Reference Range & Units 08/06/24 08:29 08/06/24 09:33 08/06/24 10:53 08/06/24 11:47 08/06/24 12:56 08/06/24 14:05  Glucose-Capillary 70 - 99 mg/dL 791 (H) 861 (H) 843 (H) 220 (H) 253 (H) 214 (H)  (H): Data is abnormally high Diabetes history: Type 2 DM Outpatient Diabetes medications: Lantus  20 units BID, Novolog  0-6 units TID, Metformin  500 mg BID Current orders for Inpatient glycemic control: IV insulin  transitioned to Lantus  20 units BID, Novolog  0-15 units TID & HS  Inpatient Diabetes Program Recommendations:    Spoke with patient again regarding outpatient diabetes management. Patient admits to missing doses. Is restless during visit and struggles to remain on task.   Reviewed patient's current A1c of 12.1%. Explained what a A1c is and what it measures. Also reviewed goal A1c with patient, importance of good glucose control @ home, and blood sugar goals. Reviewed patho of DM, DKA, role of pancreas, need for improved control, signs and symptoms of hyper vs hypo glycemia, CGM, vascular changes, interventions, and commorbidities.  Patient has a meter and reported it is broken. After further discussion, patient feel device was not working because the blood sugar always read, HI. Education on checking blood sugar and how to interpret data provided.  Admits to drinking juice. Reviewed recommended alternatives and importance of CHO mindfulness. Dietitian consult and outpt education placed.  Freestyle libre 3 applied to patient's arm. However, both sensors were not working, assuming it is  because smart phone is smashed and screen broken. Patient will need a reader at discharge.  Anticipate patient's insulin  needs will be more and further titration will be needed. Follow up on 12/19.  Thanks, Tinnie Minus, MSN, RNC-OB Diabetes Coordinator [phone] (8a-5p)

## 2024-08-06 NOTE — Plan of Care (Signed)
°  Problem: Health Behavior/Discharge Planning: Goal: Ability to manage health-related needs will improve Outcome: Progressing   Problem: Metabolic: Goal: Ability to maintain appropriate glucose levels will improve Outcome: Progressing   Problem: Nutritional: Goal: Progress toward achieving an optimal weight will improve Outcome: Progressing   Problem: Metabolic: Goal: Ability to maintain appropriate glucose levels will improve Outcome: Progressing   Problem: Education: Goal: Knowledge of General Education information will improve Description: Including pain rating scale, medication(s)/side effects and non-pharmacologic comfort measures Outcome: Progressing   Problem: Health Behavior/Discharge Planning: Goal: Ability to manage health-related needs will improve Outcome: Progressing

## 2024-08-06 NOTE — Progress Notes (Addendum)
 Progress Note   Patient: Ryan Bartlett FMW:[id number] DOB: [birth year] DOA: 08/05/2024     1 DOS: the patient was seen and examined on 08/06/2024   Brief hospital course: Ryan Bartlett is a 28 y.o. male with medical history significant for schizoaffective disorder, polysubstance abuse, and recently diagnosed diabetes mellitus who presents with polyuria, polydipsia, nausea, and vomiting. In the ED he has tachypnea, tachycardia.  Labs are most notable for glucose >700 with potassium 6.4, serum bicarbonate 16, creatinine 1.40, ketonuria, and mild leukocytosis.  Admitted to TRH service for further management and evaluation of DKA.  Assessment and Plan: DKA Uncontrolled type 2 diabetes mellitus (new diagnosis last month) Anion gap closed, bicarb improved. IV insulin  transitioned to SQ Lantus  20 unit q12, sliding scale. Diabetes educator on board. He will need extensive diet, exercise and diabetes education with resources, CGM education provided. TOC for PCP and dc needs.  Hyponatremia- Hyperkalemia In the setting of DKA. Improved with IV fluids, IV insulin . Continue to monitor and replete electrolytes accordingly.  AKI- Resolved with IV hydration. Avoid nephrotoxic drugs.  Polysubstance abuse- Use of cocaine, amphetamine, THC.  Discussed ill effects of illicit drugs. Encouraged to quit drugs.  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 Life style modifications encouraged.      Out of bed to chair. Incentive spirometry. Nursing supportive care. Fall, aspiration precautions. Diet:  Diet Orders (From admission, onward)     Start     Ordered   08/05/24 1231  Diet Carb Modified Room service appropriate? Yes  Diet effective now       Question Answer Comment  Diet-HS Snack? Nothing   Calorie Level Medium 1600-2000   Fluid consistency: Thin   Room service appropriate? Yes      08/05/24 1230           DVT prophylaxis: enoxaparin   (LOVENOX ) injection 40 mg Start: 08/05/24 1000  Level of care: Progressive   Code Status: Full Code  Subjective: Patient is seen and examined today morning. He is hungry wishes to eat. Denies any abdominal pain, nausea. Discussed with him regarding his diabetes and management.  Physical Exam: Vitals:   08/06/24 0039 08/06/24 0438 08/06/24 0715 08/06/24 1148  BP: (!) 128/96  114/65 (!) 143/90  Pulse: (!) 101 (!) 102 (!) 102 (!) 101  Resp: 20 19 16    Temp: 98.2 F (36.8 C) 98.3 F (36.8 C) 98.2 F (36.8 C) 98 F (36.7 C)  TempSrc: Oral Oral Oral Oral  SpO2: 99% 98% 97% 100%  Weight: 113.8 kg     Height: 5' 9 (1.753 m)       General - Young  obese African American male, no apparent distress HEENT - PERRLA, EOMI, atraumatic head, non tender sinuses. Lung - distant breath sounds, basal rales, no rhonchi, wheezes. Heart - S1, S2 heard, no murmurs, rubs, trace pedal edema. Abdomen - Soft, non tender, bowel sounds good Neuro - Alert, awake and oriented x 3, non focal exam. Skin - Warm and dry.  Data Reviewed:      Latest Ref Rng & Units 08/05/2024    1:11 AM 08/05/2024   12:58 AM 07/15/2024    7:32 AM  CBC  WBC 4.0 - 10.5 K/uL  10.6  11.1   Hemoglobin 13.0 - 17.0 g/dL 86.9 - 82.9 g/dL 83.2    83.2  84.6  87.6   Hematocrit 39.0 - 52.0 % 39.0 - 52.0 % 49.0    49.0  44.1  37.0   Platelets 150 - 400 K/uL  347  344       Latest Ref Rng & Units 08/06/2024    2:34 PM 08/06/2024    4:11 AM 08/05/2024    4:25 PM  BMP  Glucose 70 - 99 mg/dL 631  851  707   BUN 6 - 20 mg/dL 11  13  18    Creatinine 0.61 - 1.24 mg/dL 8.97  9.05  8.87   Sodium 135 - 145 mmol/L 132  131  131   Potassium 3.5 - 5.1 mmol/L 3.9  3.3  3.2   Chloride 98 - 111 mmol/L 100  99  96   CO2 22 - 32 mmol/L 23  24  22    Calcium  8.9 - 10.3 mg/dL 8.7  8.7  8.7    DG Abd 1 View Result Date: 08/05/2024 EXAM: XR ABDOMEN INTRAOPERATIVE COUNT 1 VIEW(S) XRAY OF THE ABDOMEN 08/05/2024 03:07:57 AM COMPARISON:  None available. CLINICAL HISTORY: Abdominal pain FINDINGS: No unexpected retained surgical items. IMPRESSION: 1. No acute findings. Electronically signed by: Dorethia Molt MD 08/05/2024 03:11 AM EST RP Workstation: [id number]    Family Communication: Discussed with patient, he understand and agree. All questions answered.  Disposition: Status is: Inpatient Remains inpatient appropriate because: DKA, monitor sugars, dm education  Planned Discharge Destination: Home     Time spent: 46 minutes  Author: Concepcion Riser, MD 08/06/2024 3:07 PM Secure chat 7am to 7pm For on call review [email or web address].

## 2024-08-06 NOTE — Progress Notes (Signed)
 TRH night cross cover note:   Received his basal Lantus  this AM. Remain on insulin  drip with AG closed. Discontinue insulin  drip, noting existing order for SSI.    Eva Pore, DO Hospitalist

## 2024-08-07 ENCOUNTER — Other Ambulatory Visit (HOSPITAL_COMMUNITY): Payer: Self-pay

## 2024-08-07 DIAGNOSIS — E871 Hypo-osmolality and hyponatremia: Secondary | ICD-10-CM | POA: Diagnosis not present

## 2024-08-07 DIAGNOSIS — E875 Hyperkalemia: Secondary | ICD-10-CM | POA: Diagnosis not present

## 2024-08-07 DIAGNOSIS — E669 Obesity, unspecified: Secondary | ICD-10-CM | POA: Diagnosis not present

## 2024-08-07 DIAGNOSIS — F250 Schizoaffective disorder, bipolar type: Secondary | ICD-10-CM | POA: Diagnosis not present

## 2024-08-07 DIAGNOSIS — E1110 Type 2 diabetes mellitus with ketoacidosis without coma: Secondary | ICD-10-CM | POA: Diagnosis not present

## 2024-08-07 DIAGNOSIS — F1910 Other psychoactive substance abuse, uncomplicated: Secondary | ICD-10-CM | POA: Diagnosis not present

## 2024-08-07 LAB — GLUCOSE, CAPILLARY
Glucose-Capillary: 277 mg/dL — ABNORMAL HIGH (ref 70–99)
Glucose-Capillary: 283 mg/dL — ABNORMAL HIGH (ref 70–99)
Glucose-Capillary: 260 mg/dL — ABNORMAL HIGH (ref 70–99)

## 2024-08-07 MED ORDER — GUAIFENESIN-DM 100-10 MG/5ML PO SYRP
5.0000 mL | ORAL_SOLUTION | ORAL | Status: DC | PRN
  Administered 2024-08-07: 5 mL via ORAL
  Filled 2024-08-07: qty 5

## 2024-08-07 MED ORDER — INSULIN DEGLUDEC 100 UNIT/ML ~~LOC~~ SOPN
56.0000 [IU] | PEN_INJECTOR | Freq: Every day | SUBCUTANEOUS | 1 refills | Status: AC
  Filled 2024-08-07: qty 15, 26d supply, fill #0

## 2024-08-07 MED ORDER — INSULIN GLARGINE 100 UNIT/ML ~~LOC~~ SOLN
28.0000 [IU] | Freq: Two times a day (BID) | SUBCUTANEOUS | Status: DC
  Filled 2024-08-07: qty 0.28

## 2024-08-07 MED ORDER — INSULIN ASPART 100 UNIT/ML IJ SOLN: 6.0000 [IU] | Freq: Three times a day (TID) | INTRAMUSCULAR | Status: DC

## 2024-08-07 MED ORDER — INSULIN PEN NEEDLE 32G X 4 MM MISC
1.0000 | 0 refills | Status: AC
  Filled 2024-08-07: qty 100, 25d supply, fill #0

## 2024-08-07 MED ORDER — INSULIN ASPART 100 UNIT/ML FLEXPEN
4.0000 [IU] | PEN_INJECTOR | Freq: Three times a day (TID) | SUBCUTANEOUS | 1 refills | Status: AC
  Filled 2024-08-07: qty 15, 30d supply, fill #0

## 2024-08-07 MED ORDER — ACCU-CHEK SOFTCLIX LANCETS MISC
1.0000 | 0 refills | Status: AC
  Filled 2024-08-07: qty 100, 25d supply, fill #0

## 2024-08-07 MED ORDER — BLOOD GLUCOSE MONITOR SYSTEM W/DEVICE KIT
1.0000 | PACK | 0 refills | Status: AC
  Filled 2024-08-07: qty 1, 30d supply, fill #0

## 2024-08-07 MED ORDER — METFORMIN HCL ER 500 MG PO TB24
500.0000 mg | ORAL_TABLET | Freq: Two times a day (BID) | ORAL | 0 refills | Status: AC
  Filled 2024-08-07: qty 60, 30d supply, fill #0

## 2024-08-07 MED ORDER — LANCET DEVICE MISC
1.0000 | 0 refills | Status: AC
  Filled 2024-08-07: qty 1, fill #0

## 2024-08-07 MED ORDER — GVOKE HYPOPEN 2-PACK 1 MG/0.2ML ~~LOC~~ SOAJ
1.0000 mg | SUBCUTANEOUS | 1 refills | Status: AC | PRN
  Filled 2024-08-07: qty 0.4, 30d supply, fill #0

## 2024-08-07 MED ORDER — BLOOD GLUCOSE TEST VI STRP
1.0000 | ORAL_STRIP | 0 refills | Status: AC
  Filled 2024-08-07: qty 100, 25d supply, fill #0

## 2024-08-07 NOTE — Progress Notes (Incomplete)
 " Progress Note   Patient: Ryan Bartlett FMW:[id number] DOB: 1996-02-01 DOA: 08/05/2024     2 DOS: the patient was seen and examined on 08/07/2024   Brief hospital course: Ryan Bartlett is a 28 y.o. male with medical history significant for schizoaffective disorder, polysubstance abuse, and recently diagnosed diabetes mellitus who presents with polyuria, polydipsia, nausea, and vomiting. In the ED he has tachypnea, tachycardia.  Labs are most notable for glucose >700 with potassium 6.4, serum bicarbonate 16, creatinine 1.40, ketonuria, and mild leukocytosis.  Admitted to TRH service for further management and evaluation of DKA.  Assessment and Plan: DKA Uncontrolled type 2 diabetes mellitus (new diagnosis last month) Anion gap closed, bicarb improved. IV insulin  transitioned to SQ Lantus  20 unit q12, sliding scale. Diabetes educator on board. He will need extensive diet, exercise and diabetes education with resources, CGM education provided. TOC for PCP and dc needs.  Hyponatremia- Hyperkalemia In the setting of DKA. Improved with IV fluids, IV insulin . Continue to monitor and replete electrolytes accordingly.  AKI- Resolved with IV hydration. Avoid nephrotoxic drugs.  Polysubstance abuse- Use of cocaine, amphetamine, THC.  Discussed ill effects of illicit drugs. Encouraged to quit drugs.  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 Life style modifications encouraged.   {Tip this will not be part of the note when signed Body mass index is 37.05 kg/m. , ,  (Optional):26781}   Out of bed to chair. Incentive spirometry. Nursing supportive care. Fall, aspiration precautions. Diet:  Diet Orders (From admission, onward)     Start     Ordered   08/05/24 1231  Diet Carb Modified Room service appropriate? Yes  Diet effective now       Question Answer Comment  Diet-HS Snack? Nothing   Calorie Level Medium 1600-2000   Fluid consistency:  Thin   Room service appropriate? Yes      08/05/24 1230           DVT prophylaxis: enoxaparin  (LOVENOX ) injection 40 mg Start: 08/05/24 1000  Level of care: Progressive   Code Status: Full Code  Subjective: Patient is seen and examined today morning. He is hungry wishes to eat. Denies any abdominal pain, nausea. Discussed with him regarding his diabetes and management.  Physical Exam: Vitals:   08/06/24 1958 08/06/24 2306 08/07/24 0710 08/07/24 1129  BP: 117/63 122/68 (!) 154/97 122/89  Pulse: (!) 105 (!) 104 99 92  Resp:  18 16 16   Temp: 98 F (36.7 C) 98.3 F (36.8 C) 98 F (36.7 C) 98.1 F (36.7 C)  TempSrc: Oral Oral Oral Oral  SpO2: 100% 98% 97% 98%  Weight:      Height:        General - Young  obese African American male, no apparent distress HEENT - PERRLA, EOMI, atraumatic head, non tender sinuses. Lung - distant breath sounds, basal rales, no rhonchi, wheezes. Heart - S1, S2 heard, no murmurs, rubs, trace pedal edema. Abdomen - Soft, non tender, bowel sounds good Neuro - Alert, awake and oriented x 3, non focal exam. Skin - Warm and dry.  Data Reviewed:      Latest Ref Rng & Units 08/05/2024    1:11 AM 08/05/2024   12:58 AM 07/15/2024    7:32 AM  CBC  WBC 4.0 - 10.5 K/uL  10.6  11.1   Hemoglobin 13.0 - 17.0 g/dL 86.9 - 82.9 g/dL 83.2    83.2  84.6  87.6   Hematocrit 39.0 -  52.0 % 39.0 - 52.0 % 49.0    49.0  44.1  37.0   Platelets 150 - 400 K/uL  347  344       Latest Ref Rng & Units 08/06/2024    2:34 PM 08/06/2024    4:11 AM 08/05/2024    4:25 PM  BMP  Glucose 70 - 99 mg/dL 631  851  707   BUN 6 - 20 mg/dL 11  13  18    Creatinine 0.61 - 1.24 mg/dL 8.97  9.05  8.87   Sodium 135 - 145 mmol/L 132  131  131   Potassium 3.5 - 5.1 mmol/L 3.9  3.3  3.2   Chloride 98 - 111 mmol/L 100  99  96   CO2 22 - 32 mmol/L 23  24  22    Calcium  8.9 - 10.3 mg/dL 8.7  8.7  8.7    No results found.   Family Communication: Discussed with patient, he  understand and agree. All questions answered.  Disposition: Status is: Inpatient Remains inpatient appropriate because: DKA, monitor sugars, dm education  Planned Discharge Destination: Home  {Tip this will not be part of the note when signed  DVT Prophylaxis  ., Enoxaparin  (lovenox ) injection 40 mg  (Optional):26781}   Time spent: 46 minutes  Author: Concepcion Riser, MD 08/07/2024 11:56 AM Secure chat 7am to 7pm For on call review [email or web address]    "

## 2024-08-07 NOTE — Discharge Summary (Signed)
 " Physician Discharge Summary   Patient: Ryan Bartlett MRN: [id number] DOB: 17-Sep-1995  Admit date:     08/05/2024  Discharge date: {dischdate:26783}  Discharge Physician: Concepcion Riser   PCP: Patient, No Pcp Per   Recommendations at discharge:  {Tip this will not be part of the note when signed- Example include specific recommendations for outpatient follow-up, pending tests to follow-up on. (Optional):26781}  PCP follow up in 1 week.  Discharge Diagnoses: Principal Problem:   DKA (diabetic ketoacidosis) (HCC) Active Problems:   Schizoaffective disorder, bipolar type (HCC)   Hyperkalemia   Polysubstance abuse (HCC)  Resolved Problems:   * No resolved hospital problems. *  Hospital Course: Ryan Bartlett is a 28 y.o. male with medical history significant for schizoaffective disorder, polysubstance abuse, and recently diagnosed diabetes mellitus who presents with polyuria, polydipsia, nausea, and vomiting. In the ED he has tachypnea, tachycardia.  Labs are most notable for glucose >700 with potassium 6.4, serum bicarbonate 16, creatinine 1.40, ketonuria, and mild leukocytosis.  Admitted to TRH service for further management and evaluation of DKA.  Assessment and Plan: DKA Uncontrolled type 2 diabetes mellitus (new diagnosis last month A1c 12.1) Anion gap closed, bicarb improved. IV insulin  transitioned to SQ Lantus  20 unit q12, sliding scale. Diabetes educator on board. Patient got extensive diet, exercise and diabetes education with resources provided. New scripts for Tresiba , novolog , blood glucose monitor and diabetes supplies sent to [facility] pharmacy,  Hyponatremia- Hyperkalemia In the setting of DKA. Improved with IV fluids, IV insulin . Continue to monitor and replete electrolytes accordingly.  AKI- Resolved with IV hydration. Avoid nephrotoxic drugs.  Polysubstance abuse- Use of cocaine, amphetamine, THC.  Discussed ill effects of illicit  drugs. Encouraged to quit drugs.  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 Life style modifications encouraged.     {Tip this will not be part of the note when signed Body mass index is 37.05 kg/m. , ,  (Optional):26781}  {(NOTE) Pain control PDMP Statment (Optional):26782} Consultants: none Procedures performed: none  Disposition: Home Diet recommendation:  Discharge Diet Orders (From admission, onward)     Start     Ordered   08/07/24 0000  Diet Carb Modified        08/07/24 1336           Carb modified diet DISCHARGE MEDICATION: Allergies as of 08/07/2024       Reactions   Ativan  [lorazepam ] Anxiety   Worsening Anxiety/Agitation    Peanut (diagnostic) Anaphylaxis   Lactose Intolerance (gi) Diarrhea, Nausea And Vomiting        Medication List     STOP taking these medications    Lantus  SoloStar 100 UNIT/ML Solostar Pen Generic drug: insulin  glargine       TAKE these medications    Accu-Chek Softclix Lancets lancets Use up to four times daily as directed. (FOR ICD-10 E10.9, E11.9).   acetaminophen  325 MG tablet Commonly known as: TYLENOL  Take 2 tablets (650 mg total) by mouth every 6 (six) hours as needed for mild pain (pain score 1-3).   Aristada  1064 MG/3.9ML prefilled syringe Generic drug: ARIPiprazole  Lauroxil ER SMARTSIG:106 Milligram(s) IM Every 4 Weeks   Blood Glucose Monitor System w/Device Kit 1 each by Does not apply route as directed. Dispense based on patient and insurance preference. Use up to four times daily as directed. (FOR ICD-10 E10.9, E11.9). What changed: additional instructions   BLOOD GLUCOSE TEST STRIPS Strp Dispense based on patient and insurance preference.  Use up to four times daily as directed. (FOR ICD-10 E10.9, E11.9).   Gvoke HypoPen  2-Pack 1 MG/0.2ML Soaj Generic drug: Glucagon  Inject 1 mg into the skin as needed for up to 2 doses (Severe low blood sugar).   insulin  aspart  100 UNIT/ML FlexPen Commonly known as: NOVOLOG  Inject 4 Units into the skin 3 (three) times daily with meals. Only take if eating a meal AND Blood Glucose (BG) is 80 or higher. What changed:  how much to take additional instructions   insulin  degludec 100 UNIT/ML FlexTouch Pen Commonly known as: TRESIBA  Inject 56 Units into the skin at bedtime. May substitute as needed per insurance.   Insulin  Pen Needle 32G X 4 MM Misc 1 each by Does not apply route as directed. Dispense based on patient and insurance preference. Use up to four times daily as directed. (FOR ICD-10 E10.9, E11.9). What changed: medication strength   Insulin  Pen Needle 32G X 4 MM Misc 1 each by Does not apply route as directed. Dispense based on patient and insurance preference. Use up to four times daily as directed. (FOR ICD-10 E10.9, E11.9). What changed: You were already taking a medication with the same name, and this prescription was added. Make sure you understand how and when to take each.   Lancet Device Misc 1 each by Does not apply route as directed. Dispense based on patient and insurance preference. Use up to four times daily as directed. (FOR ICD-10 E10.9, E11.9).   metFORMIN  500 MG 24 hr tablet Commonly known as: GLUCOPHAGE -XR Take 1 tablet (500 mg total) by mouth 2 (two) times daily.   OLANZapine  zydis 5 MG disintegrating tablet Commonly known as: ZYPREXA  Take 1 tablet (5 mg total) by mouth 2 (two) times daily for 7 days.        Discharge Exam: Filed Weights   08/05/24 0047 08/06/24 0039  Weight: 102 kg 113.8 kg   ***  Condition at discharge: stable  The results of significant diagnostics from this hospitalization (including imaging, microbiology, ancillary and laboratory) are listed below for reference.   Imaging Studies: DG Abd 1 View Result Date: 08/05/2024 EXAM: XR ABDOMEN INTRAOPERATIVE COUNT 1 VIEW(S) XRAY OF THE ABDOMEN 08/05/2024 03:07:57 AM COMPARISON: None available. CLINICAL  HISTORY: Abdominal pain FINDINGS: No unexpected retained surgical items. IMPRESSION: 1. No acute findings. Electronically signed by: Dorethia Molt MD 08/05/2024 03:11 AM EST RP Workstation: [id number]   CT ABDOMEN PELVIS WO CONTRAST Result Date: 07/08/2024 EXAM: CT ABDOMEN AND PELVIS WITHOUT CONTRAST 07/08/2024 06:45:00 PM TECHNIQUE: CT of the abdomen and pelvis was performed without the administration of intravenous contrast. Multiplanar reformatted images are provided for review. Automated exposure control, iterative reconstruction, and/or weight-based adjustment of the mA/kV was utilized to reduce the radiation dose to as low as reasonably achievable. COMPARISON: None available. CLINICAL HISTORY: Abdominal pain, acute, nonlocalized. FINDINGS: LOWER CHEST: No acute abnormality. LIVER: The liver is unremarkable. GALLBLADDER AND BILE DUCTS: Gallbladder is unremarkable. No biliary ductal dilatation. SPLEEN: No acute abnormality. PANCREAS: No acute abnormality. ADRENAL GLANDS: No acute abnormality. KIDNEYS, URETERS AND BLADDER: No stones in the kidneys or ureters. No hydronephrosis. No perinephric or periureteral stranding. Urinary bladder is unremarkable. GI AND BOWEL: Fluid is seen within the mid esophagus which may reflect changes of esophageal dysmotility or gastroesophageal reflux. Appendix normal. The stomach, small bowel, and large bowel are otherwise unremarkable. There is no bowel obstruction. PERITONEUM AND RETROPERITONEUM: No ascites. No free air. VASCULATURE: Aorta is normal in caliber. LYMPH NODES: No lymphadenopathy. REPRODUCTIVE  ORGANS: No acute abnormality. BONES AND SOFT TISSUES: Possible injection granuloma within the subcutaneous soft tissues of the gluteal regions bilaterally. No acute osseous abnormality. IMPRESSION: 1. No acute findings in the abdomen or pelvis 2. Fluid within the mid esophagus, possibly related to esophageal dysmotility or gastroesophageal reflux Electronically signed by:  Dorethia Molt MD 07/08/2024 07:32 PM EST RP Workstation: [id number]    Microbiology: Results for orders placed or performed during the hospital encounter of 07/08/24  MRSA Next Gen by PCR, Nasal     Status: None   Collection Time: 07/10/24  3:00 AM   Specimen: Nasal Mucosa; Nasal Swab  Result Value Ref Range Status   MRSA by PCR Next Gen NOT DETECTED NOT DETECTED Final    Comment: (NOTE) The GeneXpert MRSA Assay (FDA approved for NASAL specimens only), is one component of a comprehensive MRSA colonization surveillance program. It is not intended to diagnose MRSA infection nor to guide or monitor treatment for MRSA infections. Test performance is not FDA approved in patients less than 51 years old. Performed at [facility], [address], [place], [place] [address]     Labs: CBC: Recent Labs  Lab 08/05/24 0058 08/05/24 0111  WBC 10.6*  --   NEUTROABS 7.5  --   HGB 15.3 16.7  16.7  HCT 44.1 49.0  49.0  MCV 81.8  --   PLT 347  --    Basic Metabolic Panel: Recent Labs  Lab 08/05/24 0623 08/05/24 1115 08/05/24 1625 08/06/24 0411 08/06/24 1434  NA 129* 133* 131* 131* 132*  K 4.4 3.5 3.2* 3.3* 3.9  CL 91* 94* 96* 99 100  CO2 19* 21* 22 24 23   GLUCOSE 261* 195* 292* 148* 368*  BUN 20 18 18 13 11   CREATININE 1.24 1.22 1.12 0.94 1.02  CALCIUM  10.0 9.8 8.7* 8.7* 8.7*  MG  --  2.3  --   --   --    Liver Function Tests: Recent Labs  Lab 08/05/24 0235  AST 10*  ALT 15  ALKPHOS 121  BILITOT 0.5  PROT 7.9  ALBUMIN 4.5   CBG: Recent Labs  Lab 08/06/24 1951 08/06/24 2152 08/07/24 0238 08/07/24 0622 08/07/24 1131  GLUCAP 201* 141* 277* 283* 260*    Discharge time spent: 37 minutes.  Signed: Concepcion Riser, MD [facility] 08/07/2024 "

## 2024-08-07 NOTE — Plan of Care (Signed)
" °  Problem: Coping: Goal: Ability to adjust to condition or change in health will improve Outcome: Progressing   Problem: Fluid Volume: Goal: Ability to maintain a balanced intake and output will improve Outcome: Progressing   Problem: Health Behavior/Discharge Planning: Goal: Ability to manage health-related needs will improve Outcome: Progressing   Problem: Health Behavior/Discharge Planning: Goal: Ability to identify and utilize available resources and services will improve Outcome: Progressing   Problem: Skin Integrity: Goal: Risk for impaired skin integrity will decrease Outcome: Progressing   Problem: Nutritional: Goal: Maintenance of adequate nutrition will improve Outcome: Progressing   "

## 2024-08-07 NOTE — Progress Notes (Addendum)
 Nutrition Education Note  RD consulted for nutrition education regarding diabetes. Patient with recently diagnosed type 2 diabetes. He reports not having any diet education since his diagnosis last month.   Lab Results  Component Value Date   HGBA1C 12.1 (H) 07/09/2024    RD provided Carbohydrate Counting for People with Diabetes and Plate Method for Diabetes handouts from the Academy of [employer] and Dietetics. Discussed different food groups and their effects on blood sugar, emphasizing carbohydrate-containing foods. Provided list of carbohydrates and recommended serving sizes of common foods.  Discussed importance of controlled and consistent carbohydrate intake throughout the day. Provided examples of ways to balance meals/snacks and encouraged intake of high-fiber, whole grain complex carbohydrates. Teach back method used.  Expect good compliance.  Body mass index is 37.05 kg/m. Pt meets criteria for obesity based on current BMI.  Current diet order is carbohydrate modified, patient is consuming approximately 100% of meals at this time. Labs and medications reviewed. Outpatient diabetes education referral has been ordered. No further nutrition interventions warranted at this time. RD contact information provided. If additional nutrition issues arise, please re-consult RD.  Suzen HUNT RD, LDN, CNSC Contact via secure chat. If unavailable, use group chat RD Inpatient.

## 2024-08-07 NOTE — TOC Initial Note (Signed)
 Transition of Care [facility]) - Initial/Assessment Note    Patient Details  Name: Ryan Bartlett MRN: [id number] Date of Birth: 09/28/1995  Transition of Care [facility]) CM/SW Contact:    Waddell Barnie Rama, RN Phone Number: 08/07/2024, 2:34 PM  Clinical Narrative:                 From home alone, has PCP and insurance on file, states has no HH services in place at this time or DME at home.  States he will need a cab voucher to go home at [employer] and family is support system, states gets medications from [employer] and the [facility] ( [facility]).  Pta self ambulatory.   The diabetic educator has seen this patient and he knows how to do his injections.     Expected Discharge Plan: Home/Self Care Barriers to Discharge: No Barriers Identified   Patient Goals and CMS Choice Patient states their goals for this hospitalization and ongoing recovery are:: return home   Choice offered to / list presented to : NA      Expected Discharge Plan and Services   Discharge Planning Services: CM Consult Post Acute Care Choice: NA Living arrangements for the past 2 months: Apartment Expected Discharge Date: 08/07/24                 DME Agency: NA         HH Agency: NA        Prior Living Arrangements/Services Living arrangements for the past 2 months: Apartment Lives with:: Self Patient language and need for interpreter reviewed:: Yes Do you feel safe going back to the place where you live?: Yes      Need for Family Participation in Patient Care: No (Comment) Care giver support system in place?: No (comment)   Criminal Activity/Legal Involvement Pertinent to Current Situation/Hospitalization: No - Comment as needed  Activities of Daily Living   ADL Screening (condition at time of admission) Independently performs ADLs?: Yes (appropriate for developmental age) Is the patient deaf or have difficulty hearing?: No Does the patient have difficulty seeing, even when wearing  glasses/contacts?: Yes (pt states sometimes his vision fades in his left eye) Does the patient have difficulty concentrating, remembering, or making decisions?: No  Permission Sought/Granted Permission sought to share information with : Case Manager Permission granted to share information with : Yes, Verbal Permission Granted              Emotional Assessment Appearance:: Appears stated age Attitude/Demeanor/Rapport: Engaged Affect (typically observed): Appropriate Orientation: : Oriented to Self, Oriented to Place, Oriented to  Time, Oriented to Situation Alcohol / Substance Use: Not Applicable Psych Involvement: No (comment)  Admission diagnosis:  DKA (diabetic ketoacidosis) (HCC) [E11.10] Diabetic ketoacidosis without coma associated with type 1 diabetes mellitus (HCC) [E10.10] Vomiting without nausea, unspecified vomiting type [R11.11] Patient Active Problem List   Diagnosis Date Noted   Polysubstance abuse (HCC) 08/05/2024   Acute delirium 07/10/2024   DKA (diabetic ketoacidosis) (HCC) 07/09/2024   Emesis 07/09/2024   Hyponatremia 07/09/2024   AKI (acute kidney injury) 07/09/2024   Hyperkalemia 07/09/2024   Substance induced mood disorder (HCC) 10/04/2019   PTSD (post-traumatic stress disorder) 04/09/2018   Schizoaffective disorder, bipolar type (HCC) 04/08/2018   Adjustment disorder with depressed mood 11/22/2017   Suicidal thoughts 11/07/2016   Bipolar 2 disorder (HCC) 08/08/2016   Methamphetamine abuse (HCC) 03/08/2016   Bipolar affect, depressed (HCC) 09/18/2015   Bipolar affective disorder, currently depressed,  mild (HCC) 09/17/2015   Intentional overdose of drug in tablet form (HCC)    Depression    Suicidal ideation    Cannabis use disorder, moderate, dependence (HCC) 06/06/2015   Alcohol use disorder, moderate, dependence (HCC) 06/06/2015   Bipolar I disorder (HCC) 08/19/2013   Cannabis abuse 03/30/2012   ADHD (attention deficit hyperactivity disorder),  combined type 08/28/2011   Conduct disorder, adolescent onset type 08/28/2011   PCP:  Patient, No Pcp Per Pharmacy:   [facility] - [place], [place] - [address] [facility] [address] [place] [address] Phone: [phone] Fax: [phone]  [facility] [address] [place] [address] Phone: [phone] Fax: [phone]     Social Drivers of Health (SDOH) Social History: SDOH Screenings   Food Insecurity: Food Insecurity Present (08/06/2024)  Housing: Low Risk (08/06/2024)  Transportation Needs: No Transportation Needs (08/06/2024)  Utilities: At Risk (08/06/2024)  Tobacco Use: High Risk (08/05/2024)   SDOH Interventions:     Readmission Risk Interventions    08/07/2024    2:31 PM  Readmission Risk Prevention Plan  Transportation Screening Complete  PCP or Specialist Appt within 3-5 Days Complete  HRI or Home Care Consult Complete  Palliative Care Screening Not Applicable  Medication Review (RN Care Manager) Complete

## 2024-08-07 NOTE — Inpatient Diabetes Management (Signed)
 Inpatient Diabetes Program Recommendations  AACE/ADA: New Consensus Statement on Inpatient Glycemic Control (2015)  Target Ranges:  Prepandial:   less than 140 mg/dL      Peak postprandial:   less than 180 mg/dL (1-2 hours)      Critically ill patients:  140 - 180 mg/dL   Lab Results  Component Value Date   GLUCAP 283 (H) 08/07/2024   HGBA1C 12.1 (H) 07/09/2024    Review of Glycemic Control  Latest Reference Range & Units 08/06/24 16:02 08/06/24 17:43 08/06/24 18:50 08/06/24 19:51 08/06/24 21:52 08/07/24 02:38 08/07/24 06:22  Glucose-Capillary 70 - 99 mg/dL 852 (H) 778 (H) 769 (H) 201 (H) 141 (H) 277 (H) 283 (H)  (H): Data is abnormally high Diabetes history: Type 2 DM Outpatient Diabetes medications: Lantus  20 units BID, Novolog  0-6 units TID, Metformin  500 mg BID Current orders for Inpatient glycemic control: IV insulin  transitioned to Lantus  20 units BID, Novolog  0-15 units TID & HS   Inpatient Diabetes Program Recommendations:   Consider: -Increasing Lantus  28 units BID -Adding Novolog  6 units TID (Assuming patient is consuming >50% of meals)  Thanks, Tinnie Minus, MSN, RNC-OB Diabetes Coordinator [phone] (8a-5p)

## 2024-08-07 NOTE — Plan of Care (Signed)
  Problem: Coping: Goal: Ability to adjust to condition or change in health will improve Outcome: Progressing   Problem: Health Behavior/Discharge Planning: Goal: Ability to identify and utilize available resources and services will improve Outcome: Progressing   

## 2024-08-07 NOTE — TOC Transition Note (Signed)
 Transition of Care [facility]) - Discharge Note   Patient Details  Name: Ryan Bartlett MRN: [id number] Date of Birth: 1996/08/11  [facility]) CM/SW Contact:  Waddell Barnie Rama, RN Phone Number: 08/07/2024, 2:36 PM   Clinical Narrative:    For dc today , will need cab transport in dc lounge.    Final next level of care: Home/Self Care Barriers to Discharge: No Barriers Identified   Patient Goals and CMS Choice Patient states their goals for this hospitalization and ongoing recovery are:: return home   Choice offered to / list presented to : NA      Discharge Placement                       Discharge Plan and Services Additional resources added to the After Visit Summary for     Discharge Planning Services: CM Consult Post Acute Care Choice: NA            DME Agency: NA         HH Agency: NA        Social Drivers of Health (SDOH) Interventions SDOH Screenings   Food Insecurity: Food Insecurity Present (08/06/2024)  Housing: Low Risk (08/06/2024)  Transportation Needs: No Transportation Needs (08/06/2024)  Utilities: At Risk (08/06/2024)  Tobacco Use: High Risk (08/05/2024)     Readmission Risk Interventions    08/07/2024    2:31 PM  Readmission Risk Prevention Plan  Transportation Screening Complete  PCP or Specialist Appt within 3-5 Days Complete  HRI or Home Care Consult Complete  Palliative Care Screening Not Applicable  Medication Review (RN Care Manager) Complete

## 2024-08-10 ENCOUNTER — Ambulatory Visit: Payer: MEDICAID | Admitting: Family

## 2024-08-17 ENCOUNTER — Telehealth: Payer: Self-pay | Admitting: *Deleted

## 2024-08-17 NOTE — Progress Notes (Unsigned)
 Care Guide Pharmacy Note  08/17/2024 Name: AYMAN BRULL MRN: [id number] DOB: 1996-07-02  Referred By: Patient, No Pcp Per Reason for referral: Call Attempt #1 and Complex Care Management (Outreach to schedule referral with pharmacist )   Massie ONEIDA Pander is a 28 y.o. year old male who is a primary care patient of Patient, No Pcp Per.  Massie ONEIDA Pander was referred to the pharmacist for assistance related to: DMII  An unsuccessful telephone outreach was attempted today to contact the patient who was referred to the pharmacy team for assistance with medication management. Additional attempts will be made to contact the patient.  Thedford Franks, CMA [place]  [facility], [facility] Guide Direct Dial: [phone]  Fax: [phone] Website: [email or web address]

## 2024-08-18 NOTE — Progress Notes (Unsigned)
 Care Guide Pharmacy Note  08/18/2024 Name: Ryan Bartlett MRN: [id number] DOB: 1996/04/10  Referred By: Patient, No Pcp Per Reason for referral: Call Attempt #1 and Complex Care Management (Outreach to schedule referral with pharmacist )   Ryan Bartlett is a 28 y.o. year old male who is a primary care patient of Patient, No Pcp Per.  Ryan Bartlett was referred to the pharmacist for assistance related to: DMII  A second unsuccessful telephone outreach was attempted today to contact the patient who was referred to the pharmacy team for assistance with medication management. Additional attempts will be made to contact the patient.  Thedford Franks, CMA Ryan Bartlett  [facility], [facility] Guide Direct Dial: [phone]  Fax: [phone] Website: [email or web address]

## 2024-08-19 NOTE — Progress Notes (Signed)
 Care Guide Pharmacy Note  08/19/2024 Name: Ryan Bartlett MRN: [id number] DOB: Dec 08, 1995  Referred By: Patient, No Pcp Per Reason for referral: Call Attempt #1 and Complex Care Management (Outreach to schedule referral with pharmacist )   Ryan Bartlett is a 28 y.o. year old male who is a primary care patient of Patient, No Pcp Per.  Ryan Bartlett was referred to the pharmacist for assistance related to: DMII  A third unsuccessful telephone outreach was attempted today to contact the patient who was referred to the pharmacy team for assistance with medication management. The Population Health team is pleased to engage with this patient at any time in the future upon receipt of referral and should he/she be interested in assistance from the Population Health team.  Thedford Franks, CMA [facility]  [facility], [facility] Guide Direct Dial: [phone]  Fax: [phone] Website: [email or web address]

## 2024-09-25 ENCOUNTER — Other Ambulatory Visit: Payer: Self-pay

## 2024-09-25 ENCOUNTER — Inpatient Hospital Stay (HOSPITAL_COMMUNITY): Admission: EM | Admit: 2024-09-25 | Payer: MEDICAID | Source: Home / Self Care | Admitting: Hospitalist

## 2024-09-25 ENCOUNTER — Emergency Department (HOSPITAL_COMMUNITY): Payer: MEDICAID

## 2024-09-25 ENCOUNTER — Encounter (HOSPITAL_COMMUNITY): Payer: Self-pay

## 2024-09-25 DIAGNOSIS — E1110 Type 2 diabetes mellitus with ketoacidosis without coma: Principal | ICD-10-CM | POA: Diagnosis present

## 2024-09-25 LAB — BASIC METABOLIC PANEL WITH GFR
Anion gap: 22 — ABNORMAL HIGH (ref 5–15)
BUN: 15 mg/dL (ref 6–20)
BUN: 14 mg/dL (ref 6–20)
BUN: 12 mg/dL (ref 6–20)
CO2: <7 mmol/L — ABNORMAL LOW (ref 22–32)
CO2: <7 mmol/L — ABNORMAL LOW (ref 22–32)
CO2: 10 mmol/L — ABNORMAL LOW (ref 22–32)
Calcium: 9.4 mg/dL (ref 8.9–10.3)
Calcium: 8.6 mg/dL — ABNORMAL LOW (ref 8.9–10.3)
Calcium: 9.3 mg/dL (ref 8.9–10.3)
Chloride: 87 mmol/L — ABNORMAL LOW (ref 98–111)
Chloride: 96 mmol/L — ABNORMAL LOW (ref 98–111)
Chloride: 99 mmol/L (ref 98–111)
Creatinine, Ser: 1.43 mg/dL — ABNORMAL HIGH (ref 0.61–1.24)
Creatinine, Ser: 1.2 mg/dL (ref 0.61–1.24)
Creatinine, Ser: 1.13 mg/dL (ref 0.61–1.24)
GFR, Estimated: >60 mL/min
GFR, Estimated: >60 mL/min
GFR, Estimated: >60 mL/min
Glucose, Bld: 431 mg/dL — ABNORMAL HIGH (ref 70–99)
Glucose, Bld: 210 mg/dL — ABNORMAL HIGH (ref 70–99)
Glucose, Bld: 144 mg/dL — ABNORMAL HIGH (ref 70–99)
Potassium: 5.5 mmol/L — ABNORMAL HIGH (ref 3.5–5.1)
Potassium: 4 mmol/L (ref 3.5–5.1)
Potassium: 3.6 mmol/L (ref 3.5–5.1)
Sodium: 121 mmol/L — ABNORMAL LOW (ref 135–145)
Sodium: 127 mmol/L — ABNORMAL LOW (ref 135–145)
Sodium: 131 mmol/L — ABNORMAL LOW (ref 135–145)

## 2024-09-25 LAB — I-STAT CHEM 8, ED
BUN: 19 mg/dL (ref 6–20)
Calcium, Ion: 1.24 mmol/L (ref 1.15–1.40)
Chloride: 101 mmol/L (ref 98–111)
Creatinine, Ser: 1 mg/dL (ref 0.61–1.24)
Glucose, Bld: 395 mg/dL — ABNORMAL HIGH (ref 70–99)
HCT: 56 % — ABNORMAL HIGH (ref 39.0–52.0)
Hemoglobin: 19 g/dL — ABNORMAL HIGH (ref 13.0–17.0)
Potassium: 5.4 mmol/L — ABNORMAL HIGH (ref 3.5–5.1)
Sodium: 126 mmol/L — ABNORMAL LOW (ref 135–145)
TCO2: 9 mmol/L — ABNORMAL LOW (ref 22–32)

## 2024-09-25 LAB — CBC WITH DIFFERENTIAL/PLATELET
Abs Immature Granulocytes: 0.06 10*3/uL (ref 0.00–0.07)
Basophils Absolute: 0.1 10*3/uL (ref 0.0–0.1)
Basophils Relative: 1 %
Eosinophils Absolute: 0.5 10*3/uL (ref 0.0–0.5)
Eosinophils Relative: 4 %
HCT: 52.4 % — ABNORMAL HIGH (ref 39.0–52.0)
Hemoglobin: 18 g/dL — ABNORMAL HIGH (ref 13.0–17.0)
Immature Granulocytes: 1 %
Lymphocytes Relative: 8 %
Lymphs Abs: 0.9 10*3/uL (ref 0.7–4.0)
MCH: 29.1 pg (ref 26.0–34.0)
MCHC: 34.4 g/dL (ref 30.0–36.0)
MCV: 84.8 fL (ref 80.0–100.0)
Monocytes Absolute: 1.2 10*3/uL — ABNORMAL HIGH (ref 0.1–1.0)
Monocytes Relative: 10 %
Neutro Abs: 9.1 10*3/uL — ABNORMAL HIGH (ref 1.7–7.7)
Neutrophils Relative %: 76 %
Platelets: 288 10*3/uL (ref 150–400)
RBC: 6.18 MIL/uL — ABNORMAL HIGH (ref 4.22–5.81)
RDW: 14.6 % (ref 11.5–15.5)
WBC: 11.8 10*3/uL — ABNORMAL HIGH (ref 4.0–10.5)
nRBC: 0 % (ref 0.0–0.2)

## 2024-09-25 LAB — I-STAT VENOUS BLOOD GAS, ED
Acid-base deficit: 21 mmol/L — ABNORMAL HIGH (ref 0.0–2.0)
Acid-base deficit: 23 mmol/L — ABNORMAL HIGH (ref 0.0–2.0)
Bicarbonate: 7.4 mmol/L — ABNORMAL LOW (ref 20.0–28.0)
Bicarbonate: 6.5 mmol/L — ABNORMAL LOW (ref 20.0–28.0)
Calcium, Ion: 1.2 mmol/L (ref 1.15–1.40)
Calcium, Ion: 1.19 mmol/L (ref 1.15–1.40)
HCT: 55 % — ABNORMAL HIGH (ref 39.0–52.0)
HCT: 55 % — ABNORMAL HIGH (ref 39.0–52.0)
Hemoglobin: 18.7 g/dL — ABNORMAL HIGH (ref 13.0–17.0)
Hemoglobin: 18.7 g/dL — ABNORMAL HIGH (ref 13.0–17.0)
O2 Saturation: 23 %
O2 Saturation: 62 %
Potassium: 5.3 mmol/L — ABNORMAL HIGH (ref 3.5–5.1)
Potassium: 5.3 mmol/L — ABNORMAL HIGH (ref 3.5–5.1)
Sodium: 125 mmol/L — ABNORMAL LOW (ref 135–145)
Sodium: 124 mmol/L — ABNORMAL LOW (ref 135–145)
TCO2: 8 mmol/L — ABNORMAL LOW (ref 22–32)
TCO2: 7 mmol/L — ABNORMAL LOW (ref 22–32)
pCO2, Ven: 24.6 mmHg — ABNORMAL LOW (ref 44–60)
pCO2, Ven: 24.8 mmHg — ABNORMAL LOW (ref 44–60)
pH, Ven: 7.087 — CL (ref 7.25–7.43)
pH, Ven: 7.025 — CL (ref 7.25–7.43)
pO2, Ven: 22 mmHg — CL (ref 32–45)
pO2, Ven: 46 mmHg — ABNORMAL HIGH (ref 32–45)

## 2024-09-25 LAB — COMPREHENSIVE METABOLIC PANEL WITH GFR
ALT: 13 U/L (ref 0–44)
AST: 13 U/L — ABNORMAL LOW (ref 15–41)
Albumin: 4.7 g/dL (ref 3.5–5.0)
Alkaline Phosphatase: 122 U/L (ref 38–126)
BUN: 15 mg/dL (ref 6–20)
CO2: <7 mmol/L — ABNORMAL LOW (ref 22–32)
Calcium: 9.3 mg/dL (ref 8.9–10.3)
Chloride: 89 mmol/L — ABNORMAL LOW (ref 98–111)
Creatinine, Ser: 1.39 mg/dL — ABNORMAL HIGH (ref 0.61–1.24)
GFR, Estimated: >60 mL/min
Glucose, Bld: 416 mg/dL — ABNORMAL HIGH (ref 70–99)
Potassium: 5.3 mmol/L — ABNORMAL HIGH (ref 3.5–5.1)
Sodium: 124 mmol/L — ABNORMAL LOW (ref 135–145)
Total Bilirubin: 0.5 mg/dL (ref 0.0–1.2)
Total Protein: 8.6 g/dL — ABNORMAL HIGH (ref 6.5–8.1)

## 2024-09-25 LAB — CBG MONITORING, ED
Glucose-Capillary: 149 mg/dL — ABNORMAL HIGH (ref 70–99)
Glucose-Capillary: 156 mg/dL — ABNORMAL HIGH (ref 70–99)
Glucose-Capillary: 158 mg/dL — ABNORMAL HIGH (ref 70–99)
Glucose-Capillary: 158 mg/dL — ABNORMAL HIGH (ref 70–99)
Glucose-Capillary: 173 mg/dL — ABNORMAL HIGH (ref 70–99)
Glucose-Capillary: 177 mg/dL — ABNORMAL HIGH (ref 70–99)
Glucose-Capillary: 181 mg/dL — ABNORMAL HIGH (ref 70–99)
Glucose-Capillary: 181 mg/dL — ABNORMAL HIGH (ref 70–99)
Glucose-Capillary: 184 mg/dL — ABNORMAL HIGH (ref 70–99)
Glucose-Capillary: 203 mg/dL — ABNORMAL HIGH (ref 70–99)
Glucose-Capillary: 204 mg/dL — ABNORMAL HIGH (ref 70–99)
Glucose-Capillary: 328 mg/dL — ABNORMAL HIGH (ref 70–99)
Glucose-Capillary: 401 mg/dL — ABNORMAL HIGH (ref 70–99)
Glucose-Capillary: 413 mg/dL — ABNORMAL HIGH (ref 70–99)
Glucose-Capillary: 464 mg/dL — ABNORMAL HIGH (ref 70–99)

## 2024-09-25 LAB — CBC
HCT: 43.8 % (ref 39.0–52.0)
Hemoglobin: 15.3 g/dL (ref 13.0–17.0)
MCH: 29.3 pg (ref 26.0–34.0)
MCHC: 34.9 g/dL (ref 30.0–36.0)
MCV: 83.7 fL (ref 80.0–100.0)
Platelets: 216 10*3/uL (ref 150–400)
RBC: 5.23 MIL/uL (ref 4.22–5.81)
RDW: 14.2 % (ref 11.5–15.5)
WBC: 9.8 10*3/uL (ref 4.0–10.5)
nRBC: 0 % (ref 0.0–0.2)

## 2024-09-25 LAB — TROPONIN T, HIGH SENSITIVITY
Troponin T High Sensitivity: 12 ng/L (ref 0–19)
Troponin T High Sensitivity: 11 ng/L (ref 0–19)

## 2024-09-25 LAB — BETA-HYDROXYBUTYRIC ACID
Beta-Hydroxybutyric Acid: 7.26 mmol/L — ABNORMAL HIGH (ref 0.05–0.27)
Beta-Hydroxybutyric Acid: 7.64 mmol/L — ABNORMAL HIGH (ref 0.05–0.27)
Beta-Hydroxybutyric Acid: 7.64 mmol/L — ABNORMAL HIGH (ref 0.05–0.27)
Beta-Hydroxybutyric Acid: 5.56 mmol/L — ABNORMAL HIGH (ref 0.05–0.27)

## 2024-09-25 MED ORDER — DEXTROSE IN LACTATED RINGERS 5 % IV SOLN: INTRAVENOUS | Status: AC

## 2024-09-25 MED ORDER — OLANZAPINE 5 MG PO TBDP
5.0000 mg | ORAL_TABLET | Freq: Two times a day (BID) | ORAL | Status: AC
  Administered 2024-09-25 (×2): 5 mg via ORAL
  Filled 2024-09-25 (×2): qty 1

## 2024-09-25 MED ORDER — DEXTROSE 50 % IV SOLN: 0.0000 mL | INTRAVENOUS | Status: AC | PRN

## 2024-09-25 MED ORDER — INSULIN REGULAR(HUMAN) IN NACL 100-0.9 UT/100ML-% IV SOLN
INTRAVENOUS | Status: AC
  Administered 2024-09-25: 19 [IU]/h via INTRAVENOUS
  Filled 2024-09-25 (×2): qty 100

## 2024-09-25 MED ORDER — LACTATED RINGERS IV BOLUS
1000.0000 mL | Freq: Once | INTRAVENOUS | Status: AC
  Administered 2024-09-25: 1000 mL via INTRAVENOUS

## 2024-09-25 MED ORDER — ONDANSETRON 4 MG PO TBDP
4.0000 mg | ORAL_TABLET | Freq: Three times a day (TID) | ORAL | Status: AC | PRN
  Administered 2024-09-25: 4 mg via ORAL
  Filled 2024-09-25: qty 1

## 2024-09-25 MED ORDER — ENOXAPARIN SODIUM 40 MG/0.4ML IJ SOSY
40.0000 mg | PREFILLED_SYRINGE | INTRAMUSCULAR | Status: AC
  Administered 2024-09-25: 40 mg via SUBCUTANEOUS
  Filled 2024-09-25: qty 0.4

## 2024-09-25 MED ORDER — LACTATED RINGERS IV SOLN: INTRAVENOUS | Status: AC

## 2024-09-25 MED ORDER — LACTATED RINGERS IV BOLUS
20.0000 mL/kg | Freq: Once | INTRAVENOUS | Status: AC
  Administered 2024-09-25: 1276 mL via INTRAVENOUS

## 2024-09-25 NOTE — H&P (Addendum)
 " History and Physical    Patient: Ryan Bartlett FMW:[id number] DOB: 07-28-1996 DOA: 09/25/2024 DOS: the patient was seen and examined on 09/25/2024 PCP: Patient, No Pcp Per  Patient coming from: Home  Chief Complaint:  Chief Complaint  Patient presents with   Hyperglycemia   HPI: Ryan Bartlett is a 29 y.o. male with medical history significant of schizoaffective (bipolar and depression), ADHD, PTSD, and DM2 who p/w DKA.  The patient is a poor historian due to limited participation. From what I can gather, pt admits to not taking his prescribed diabetes medications for some time. He reports feeling bad for unknown amount of time prior to presenting to the ED for evaluation. Of note, he does endorse previously taking medication for high blood pressure, but is unable to recall the medication's name.  In the ED, pt hypertensive, tachycardic, and tachypneic w/o hypoxia. Labs notable for Na 124, K 5.3, Cr 1.43, WBC 11.8, and glucose 431, and beta-hydroxybuterate 7.64. VBG pH/CO2 7.025/24.8. CXR w/ no acute proess. EDP started DKA protocol w/ Endotool, CCM consulted and requested PCU admission.   Review of Systems: As mentioned in the history of present illness. All other systems reviewed and are negative. Past Medical History:  Diagnosis Date   ADHD (attention deficit hyperactivity disorder)    Arthritis    Asthma    Bipolar disorder (HCC)    Depression    Oppositional defiant disorder    PTSD (post-traumatic stress disorder)    Schizoaffective disorder (HCC)    Unspecified episodic mood disorder    Past Surgical History:  Procedure Laterality Date   NO PAST SURGERIES     Social History:  reports that he has been smoking cigarettes. He has never used smokeless tobacco. He reports current alcohol use. He reports current drug use. Drugs: Marijuana, Benzodiazepines, and Amphetamines.  Allergies[1]  Family History  Problem Relation Age of Onset   Depression Mother    Diabetes  Other    Hyperlipidemia Other    Hypertension Other     Prior to Admission medications  Medication Sig Start Date End Date Taking? Authorizing Provider  Accu-Chek Softclix Lancets lancets Use up to four times daily as directed. (FOR ICD-10 E10.9, E11.9). 08/07/24   Darci Pore, MD  acetaminophen  (TYLENOL ) 325 MG tablet Take 2 tablets (650 mg total) by mouth every 6 (six) hours as needed for mild pain (pain score 1-3). 07/15/24   Arlice Reichert, MD  ARISTADA  [id number] MG/3.9ML prefilled syringe SMARTSIG:106 Milligram(s) IM Every 4 Weeks 04/01/24   [provider]  Blood Glucose Monitoring Suppl (BLOOD GLUCOSE MONITOR SYSTEM) w/Device KIT Dispense based on patient and insurance preference. Use up to four times daily as directed. (FOR ICD-10 E10.9, E11.9). 08/07/24   Darci Pore, MD  Glucagon  (GVOKE HYPOPEN  2-PACK) 1 MG/0.2ML SOAJ Inject 1 mg into the skin as needed for up to 2 doses (Severe low blood sugar). 08/07/24   Darci Pore, MD  Glucose Blood (BLOOD GLUCOSE TEST STRIPS) STRP Dispense based on patient and insurance preference. Use up to four times daily as directed. (FOR ICD-10 E10.9, E11.9). 08/07/24   Darci Pore, MD  insulin  aspart (NOVOLOG ) 100 UNIT/ML FlexPen Inject 4 Units into the skin 3 (three) times daily with meals. Only take if eating a meal AND Blood Glucose (BG) is 80 or higher. 08/07/24   Darci Pore, MD  insulin  degludec (TRESIBA ) 100 UNIT/ML FlexTouch Pen Inject 56 Units into the skin at bedtime. May substitute as needed per insurance. 08/07/24  Darci Pore, MD  Insulin  Pen Needle 32G X 4 MM MISC Use up to four times daily as directed. (FOR ICD-10 E10.9, E11.9). 08/07/24   Darci Pore, MD  Insulin  Pen Needle 32G X 4 MM MISC 1 each by Does not apply route as directed. Dispense based on patient and insurance preference. Use up to four times daily as directed. (FOR ICD-10 E10.9, E11.9). 08/07/24   Darci Pore, MD  Lancet Device MISC Dispense based on patient and insurance preference. Use up to four times daily as directed. (FOR ICD-10 E10.9, E11.9). 08/07/24   Darci Pore, MD  metFORMIN  (GLUCOPHAGE -XR) 500 MG 24 hr tablet Take 1 tablet (500 mg total) by mouth 2 (two) times daily. 08/07/24 09/06/24  Darci Pore, MD  OLANZapine  zydis (ZYPREXA ) 5 MG disintegrating tablet Take 1 tablet (5 mg total) by mouth 2 (two) times daily for 7 days. 07/15/24 08/05/24  Arlice Reichert, MD  albuterol  (PROVENTIL  HFA;VENTOLIN  HFA) 108 (90 Base) MCG/ACT inhaler Inhale 1-2 puffs into the lungs every 4 (four) hours as needed for wheezing or shortness of breath. 04/11/18 03/11/20  Collene Gouge I, NP  divalproex  (DEPAKOTE ) 500 MG DR tablet Take 1 tablet (500 mg total) by mouth every 12 (twelve) hours. For mood stabilization Patient not taking: Reported on 10/03/2019 04/11/18 03/11/20  Collene Gouge I, NP  omeprazole  (PRILOSEC) 20 MG capsule Take 1 capsule (20 mg total) by mouth daily. Patient not taking: Reported on 10/03/2019 12/14/18 03/11/20  Carita Senior, MD    Physical Exam: Vitals:   09/25/24 1135 09/25/24 1208 09/25/24 1300 09/25/24 1416  BP:  (!) 144/103 (!) 151/118 (!) 158/120  Pulse:  (!) 115 (!) 116 (!) 114  Resp:  (!) 25 (!) 30 (!) 21  Temp:      SpO2: 100% 100% 100% 100%  Weight:      Height:       General: Alert, oriented x3, resting comfortably in no acute distress Respiratory: Lungs clear to auscultation bilaterally with normal respiratory effort; no w/r/r Cardiovascular: Regular rate and rhythm w/o m/r/g Abdomen: Soft, nontender, nondistended. Positive bowel sounds   Data Reviewed:  Lab Results  Component Value Date   WBC 11.8 (H) 09/25/2024   HGB 18.7 (H) 09/25/2024   HCT 55.0 (H) 09/25/2024   MCV 84.8 09/25/2024   PLT 288 09/25/2024   Lab Results  Component Value Date   GLUCOSE 431 (H) 09/25/2024   CALCIUM  9.4 09/25/2024   NA 124 (L) 09/25/2024   K 5.3 (H)  09/25/2024   CO2 <7 (L) 09/25/2024   CL 87 (L) 09/25/2024   BUN 15 09/25/2024   CREATININE 1.43 (H) 09/25/2024   Lab Results  Component Value Date   ALT 13 09/25/2024   AST 13 (L) 09/25/2024   ALKPHOS 122 09/25/2024   BILITOT 0.5 09/25/2024   Lab Results  Component Value Date   INR 1.06 05/14/2012   Radiology: DG Chest 2 View Result Date: 09/25/2024 EXAM: 2 VIEW(S) XRAY OF THE CHEST 09/25/2024 11:51:00 AM COMPARISON: 12/14/2018 CLINICAL HISTORY: Chest pain. FINDINGS: LUNGS AND PLEURA: No focal pulmonary opacity. No pleural effusion. No pneumothorax. HEART AND MEDIASTINUM: No acute abnormality of the cardiac and mediastinal silhouettes. BONES AND SOFT TISSUES: No acute osseous abnormality. IMPRESSION: 1. No acute cardiopulmonary abnormality. Electronically signed by: Rogelia Myers MD 09/25/2024 12:31 PM EST RP Workstation: [id number]    Assessment and Plan: 38M h/o polysubstance abuse, schizoaffective (bipolar and depression), ADHD, PTSD, and DM2 who p/w DKA.  DKA Hyponatremia Hyperkalemia High  suspicion for noncompliance as precipitating event; no obvious infectious source -CCM consulted; recs: medidcine admission -IV insulin  w/ IVF (D5/LR) per DKA protocol/Endotool -F/u UA w/ reflex culture and treat prn  HTN Will treat DKA above and BP remains elevated consider ACEi/ARB -Consider ACEi/ARB pending repeat BP once DKA resolves -Attempted to contact [facility] [phone]) but voicemail full; will need medication reconcilliation prior to d/c  Schizoaffective disorder -PTA olanzapine  5mg  BID    Advance Care Planning:   Code Status: Full Code   Consults: CCM  Family Communication: Mother, Nichols Corter  Severity of Illness: The appropriate patient status for this patient is INPATIENT. Inpatient status is judged to be reasonable and necessary in order to provide the required intensity of service to ensure the patient's safety. The patient's presenting symptoms,  physical exam findings, and initial radiographic and laboratory data in the context of their chronic comorbidities is felt to place them at high risk for further clinical deterioration. Furthermore, it is not anticipated that the patient will be medically stable for discharge from the hospital within 2 midnights of admission.   * I certify that at the point of admission it is my clinical judgment that the patient will require inpatient hospital care spanning beyond 2 midnights from the point of admission due to high intensity of service, high risk for further deterioration and high frequency of surveillance required.*   ------- I spent 55 minutes reviewing previous notes, at the bedside counseling/discussing the treatment plan, and performing clinical documentation.  Author: Marsha Ada, MD 09/25/2024 2:36 PM  For on call review [email or web address].      [1]  Allergies Allergen Reactions   Ativan  [Lorazepam ] Anxiety    Worsening Anxiety/Agitation    Peanut (Diagnostic) Anaphylaxis   Lactose Intolerance (Gi) Diarrhea and Nausea And Vomiting   "

## 2024-09-25 NOTE — Evaluation (Signed)
 RT Evaluate and Treat Note  09/25/2024   Breathing is (select one): Same as normal    The following was found on auscultation (select multiple):                Cough Assessment:      Most Recent Chest Xray:... (DG Chest 2 View Result Date: 09/25/2024 EXAM: 2 VIEW(S) XRAY OF THE CHEST 09/25/2024 11:51:00 AM COMPARISON: 12/14/2018 CLINICAL HISTORY: Chest pain. FINDINGS: LUNGS AND PLEURA: No focal pulmonary opacity. No pleural effusion. No pneumothorax. HEART AND MEDIASTINUM: No acute abnormality of the cardiac and mediastinal silhouettes. BONES AND SOFT TISSUES: No acute osseous abnormality. IMPRESSION: 1. No acute cardiopulmonary abnormality. Electronically signed by: Rogelia Myers MD 09/25/2024 12:31 PM EST RP Workstation: [id number]      The following medications and/or interventions were ordered/changed/discontinued as part of the Respiratory Treatment protocol:   Medication Changes: No Change   Airway Clearance Changes: No Change   Oxygen Therapy Changes:No Change

## 2024-09-25 NOTE — ED Triage Notes (Signed)
 BIB EMS for hyperglycemia readying high last couple days.  Took fast acting this morning (6units)  CBG 288 with ems.    Tachypneic nausea shortness of breath.

## 2024-09-25 NOTE — Consult Note (Signed)
" ° °  NAME:  Ryan Bartlett, MRN:  [id number], DOB:  Jan 05, 1996, LOS: 0 ADMISSION DATE:  09/25/2024, CONSULTATION DATE: 09/25/2024 REFERRING MD: Ryan Ada, MD, CHIEF COMPLAINT: DKA  History of Present Illness:   28 year old with history of schizoaffective disorder, depression, ADHD, PTSD, diabetes, substance abuse with multiple admissions for DKA presenting with weakness, DKA.  He tells me that he stopped taking his insulin  several days ago because it is too cold.  Denies any fevers or chills, cough or sputum production.  PCCM asked to evaluate for low pH on VBG  Pertinent  Medical History    has a past medical history of ADHD (attention deficit hyperactivity disorder), Arthritis, Asthma, Bipolar disorder (HCC), Depression, Oppositional defiant disorder, PTSD (post-traumatic stress disorder), Schizoaffective disorder (HCC), and Unspecified episodic mood disorder.   Significant Hospital Events: Including procedures, antibiotic start and stop dates in addition to other pertinent events     Interim History / Subjective:    Objective    Blood pressure (!) 151/118, pulse (!) 116, temperature 98.2 F (36.8 C), resp. rate (!) 30, height 5' 9 (1.753 m), weight 113.8 kg, SpO2 100%.       No intake or output data in the 24 hours ending 09/25/24 1353 Filed Weights   09/25/24 1056  Weight: 113.8 kg    Examination: Awake, oriented.  Able to answer questions appropriately No distress Lungs are clear to auscultation Heart rate tachycardic, regular Abdomen soft, positive bowel sounds Extremities with no edema   Lab/imaging reviewed Significant for VBG showing pH 7.025 Sodium 145, potassium 5.3, bicarb less than 7 BUN/creatinine 19/1.00 WBC 11.8, hemoglobin 18, platelets 288  X-ray with no acute cardiopulmonary abnormality  Resolved problem list   Assessment and Plan  Severe DKA secondary to noncompliance History of polysubstance abuse, bipolar, schizoaffective disorder He is stable  and does not need ICU Low pH on VBG should correct rapidly with treatment. No evidence of any infectious source Admit to hospitalist for DKA management Check urine drug screen  PCCM will be available as needed.  Please call with questions.  Critical care time: NA   Ryan Coder MD [facility] See Amion for pager  If no response to pager , please call [phone] until 7pm After 7:00 pm call Elink  [phone] 09/25/2024, 2:44 PM        "

## 2024-09-25 NOTE — ED Provider Notes (Signed)
 " [place] EMERGENCY DEPARTMENT AT [facility] Provider Note   CSN: [id number] Arrival date & time: 09/25/24  1054     Patient presents with: Hyperglycemia   Ryan Bartlett is a 29 y.o. male.  With a history of poorly controlled type 2 diabetes DKA, polysubstance use and bipolar disorder who presents to the ED for hyperglycemia.  Recurrent hyperglycemia.  Recently admitted for DKA in December.  Has not been taking his insulin  only his fast acting this morning.  Symptoms include nausea tachypnea generalized weakness.  No fevers or recent infections    Hyperglycemia      Prior to Admission medications  Medication Sig Start Date End Date Taking? Authorizing Provider  Accu-Chek Softclix Lancets lancets Use up to four times daily as directed. (FOR ICD-10 E10.9, E11.9). 08/07/24   Darci Pore, MD  acetaminophen  (TYLENOL ) 325 MG tablet Take 2 tablets (650 mg total) by mouth every 6 (six) hours as needed for mild pain (pain score 1-3). 07/15/24   Arlice Reichert, MD  ARISTADA  [id number] MG/3.9ML prefilled syringe SMARTSIG:106 Milligram(s) IM Every 4 Weeks 04/01/24   [provider]  Blood Glucose Monitoring Suppl (BLOOD GLUCOSE MONITOR SYSTEM) w/Device KIT Dispense based on patient and insurance preference. Use up to four times daily as directed. (FOR ICD-10 E10.9, E11.9). 08/07/24   Darci Pore, MD  Glucagon  (GVOKE HYPOPEN  2-PACK) 1 MG/0.2ML SOAJ Inject 1 mg into the skin as needed for up to 2 doses (Severe low blood sugar). 08/07/24   Darci Pore, MD  Glucose Blood (BLOOD GLUCOSE TEST STRIPS) STRP Dispense based on patient and insurance preference. Use up to four times daily as directed. (FOR ICD-10 E10.9, E11.9). 08/07/24   Darci Pore, MD  insulin  aspart (NOVOLOG ) 100 UNIT/ML FlexPen Inject 4 Units into the skin 3 (three) times daily with meals. Only take if eating a meal AND Blood Glucose (BG) is 80 or higher. 08/07/24   Darci Pore, MD  insulin  degludec (TRESIBA ) 100 UNIT/ML FlexTouch Pen Inject 56 Units into the skin at bedtime. May substitute as needed per insurance. 08/07/24   Darci Pore, MD  Insulin  Pen Needle 32G X 4 MM MISC Use up to four times daily as directed. (FOR ICD-10 E10.9, E11.9). 08/07/24   Darci Pore, MD  Insulin  Pen Needle 32G X 4 MM MISC 1 each by Does not apply route as directed. Dispense based on patient and insurance preference. Use up to four times daily as directed. (FOR ICD-10 E10.9, E11.9). 08/07/24   Darci Pore, MD  Lancet Device MISC Dispense based on patient and insurance preference. Use up to four times daily as directed. (FOR ICD-10 E10.9, E11.9). 08/07/24   Darci Pore, MD  metFORMIN  (GLUCOPHAGE -XR) 500 MG 24 hr tablet Take 1 tablet (500 mg total) by mouth 2 (two) times daily. 08/07/24 09/06/24  Darci Pore, MD  OLANZapine  zydis (ZYPREXA ) 5 MG disintegrating tablet Take 1 tablet (5 mg total) by mouth 2 (two) times daily for 7 days. 07/15/24 08/05/24  Arlice Reichert, MD  albuterol  (PROVENTIL  HFA;VENTOLIN  HFA) 108 (90 Base) MCG/ACT inhaler Inhale 1-2 puffs into the lungs every 4 (four) hours as needed for wheezing or shortness of breath. 04/11/18 03/11/20  Collene Gouge I, NP  divalproex  (DEPAKOTE ) 500 MG DR tablet Take 1 tablet (500 mg total) by mouth every 12 (twelve) hours. For mood stabilization Patient not taking: Reported on 10/03/2019 04/11/18 03/11/20  Collene Gouge I, NP  omeprazole  (PRILOSEC) 20 MG capsule Take 1 capsule (20 mg total) by  mouth daily. Patient not taking: Reported on 10/03/2019 12/14/18 03/11/20  Carita Senior, MD    Allergies: Ativan  [lorazepam ], Peanut (diagnostic), and Lactose intolerance (gi)    Review of Systems  Updated Vital Signs BP (!) 158/120 (BP Location: Right Arm)   Pulse (!) 114   Temp 98 F (36.7 C) (Oral)   Resp (!) 21   Ht 5' 9 (1.753 m)   Wt 113.8 kg   SpO2 100%   BMI 37.05 kg/m    Physical Exam Vitals and nursing note reviewed.  HENT:     Head: Normocephalic and atraumatic.     Mouth/Throat:     Mouth: Mucous membranes are dry.  Eyes:     Pupils: Pupils are equal, round, and reactive to light.  Cardiovascular:     Rate and Rhythm: Regular rhythm. Tachycardia present.  Pulmonary:     Breath sounds: Normal breath sounds.     Comments: Tachypnea Abdominal:     Palpations: Abdomen is soft.     Tenderness: There is no abdominal tenderness.  Skin:    General: Skin is warm and dry.  Neurological:     Mental Status: He is alert.  Psychiatric:        Mood and Affect: Mood normal.     (all labs ordered are listed, but only abnormal results are displayed) Labs Reviewed  BETA-HYDROXYBUTYRIC ACID - Abnormal; Notable for the following components:      Result Value   Beta-Hydroxybutyric Acid 7.26 (*)    All other components within normal limits  CBC WITH DIFFERENTIAL/PLATELET - Abnormal; Notable for the following components:   WBC 11.8 (*)    RBC 6.18 (*)    Hemoglobin 18.0 (*)    HCT 52.4 (*)    Neutro Abs 9.1 (*)    Monocytes Absolute 1.2 (*)    All other components within normal limits  COMPREHENSIVE METABOLIC PANEL WITH GFR - Abnormal; Notable for the following components:   Sodium 124 (*)    Potassium 5.3 (*)    Chloride 89 (*)    CO2 <7 (*)    Glucose, Bld 416 (*)    Creatinine, Ser 1.39 (*)    Total Protein 8.6 (*)    AST 13 (*)    All other components within normal limits  BASIC METABOLIC PANEL WITH GFR - Abnormal; Notable for the following components:   Sodium 121 (*)    Potassium 5.5 (*)    Chloride 87 (*)    CO2 <7 (*)    Glucose, Bld 431 (*)    Creatinine, Ser 1.43 (*)    All other components within normal limits  BETA-HYDROXYBUTYRIC ACID - Abnormal; Notable for the following components:   Beta-Hydroxybutyric Acid 7.64 (*)    All other components within normal limits  CBG MONITORING, ED - Abnormal; Notable for the following  components:   Glucose-Capillary 464 (*)    All other components within normal limits  I-STAT VENOUS BLOOD GAS, ED - Abnormal; Notable for the following components:   pH, Ven 7.087 (*)    pCO2, Ven 24.6 (*)    pO2, Ven 22 (*)    Bicarbonate 7.4 (*)    TCO2 8 (*)    Acid-base deficit 21.0 (*)    Sodium 125 (*)    Potassium 5.3 (*)    HCT 55.0 (*)    Hemoglobin 18.7 (*)    All other components within normal limits  I-STAT CHEM 8, ED - Abnormal; Notable for the following  components:   Sodium 126 (*)    Potassium 5.4 (*)    Glucose, Bld 395 (*)    TCO2 9 (*)    Hemoglobin 19.0 (*)    HCT 56.0 (*)    All other components within normal limits  I-STAT VENOUS BLOOD GAS, ED - Abnormal; Notable for the following components:   pH, Ven 7.025 (*)    pCO2, Ven 24.8 (*)    pO2, Ven 46 (*)    Bicarbonate 6.5 (*)    TCO2 7 (*)    Acid-base deficit 23.0 (*)    Sodium 124 (*)    Potassium 5.3 (*)    HCT 55.0 (*)    Hemoglobin 18.7 (*)    All other components within normal limits  CBG MONITORING, ED - Abnormal; Notable for the following components:   Glucose-Capillary 401 (*)    All other components within normal limits  CBG MONITORING, ED - Abnormal; Notable for the following components:   Glucose-Capillary 413 (*)    All other components within normal limits  CBG MONITORING, ED - Abnormal; Notable for the following components:   Glucose-Capillary 328 (*)    All other components within normal limits  CBG MONITORING, ED - Abnormal; Notable for the following components:   Glucose-Capillary 204 (*)    All other components within normal limits  CBG MONITORING, ED - Abnormal; Notable for the following components:   Glucose-Capillary 203 (*)    All other components within normal limits  CBC  URINALYSIS, ROUTINE W REFLEX MICROSCOPIC  BASIC METABOLIC PANEL WITH GFR  BASIC METABOLIC PANEL WITH GFR  BASIC METABOLIC PANEL WITH GFR  BETA-HYDROXYBUTYRIC ACID  BETA-HYDROXYBUTYRIC ACID   BETA-HYDROXYBUTYRIC ACID  BASIC METABOLIC PANEL WITH GFR  BETA-HYDROXYBUTYRIC ACID  TROPONIN T, HIGH SENSITIVITY  TROPONIN T, HIGH SENSITIVITY    EKG: EKG Interpretation Date/Time:  Friday September 25 2024 11:02:44 EST Ventricular Rate:  125 PR Interval:  128 QRS Duration:  86 QT Interval:  342 QTC Calculation: 493 R Axis:   67  Text Interpretation: Sinus tachycardia Nonspecific ST and T wave abnormality Abnormal ECG When compared with ECG of 05-Aug-2024 01:05, PREVIOUS ECG IS PRESENT Confirmed by Pamella Sharper [phone]) on 09/25/2024 3:49:35 PM  Radiology: [facility] Chest 2 View Result Date: 09/25/2024 EXAM: 2 VIEW(S) XRAY OF THE CHEST 09/25/2024 11:51:00 AM COMPARISON: 12/14/2018 CLINICAL HISTORY: Chest pain. FINDINGS: LUNGS AND PLEURA: No focal pulmonary opacity. No pleural effusion. No pneumothorax. HEART AND MEDIASTINUM: No acute abnormality of the cardiac and mediastinal silhouettes. BONES AND SOFT TISSUES: No acute osseous abnormality. IMPRESSION: 1. No acute cardiopulmonary abnormality. Electronically signed by: Rogelia Myers MD 09/25/2024 12:31 PM EST RP Workstation: [id number]     .Critical Care  Performed by: Pamella Sharper LABOR, DO Authorized by: Pamella Sharper LABOR, DO   Critical care provider statement:    Critical care time (minutes):  30   Critical care was necessary to treat or prevent imminent or life-threatening deterioration of the following conditions:  Endocrine crisis   Critical care was time spent personally by me on the following activities:  Development of treatment plan with patient or surrogate, discussions with consultants, evaluation of patient's response to treatment, examination of patient, ordering and review of laboratory studies, ordering and review of radiographic studies, ordering and performing treatments and interventions, pulse oximetry, re-evaluation of patient's condition, review of old charts and obtaining history from patient or surrogate   I assumed  direction of critical care for this patient from another provider in  my specialty: no     Care discussed with: admitting provider   Comments:     Care discussed with ICU and hospitalist teams    Medications Ordered in the ED  insulin  regular, human (MYXREDLIN ) 100 units/ 100 mL infusion (6 Units/hr Intravenous Rate/Dose Change 09/25/24 1505)  lactated ringers  infusion (has no administration in time range)  dextrose  5 % in lactated ringers  infusion ( Intravenous New Bag/Given 09/25/24 1407)  dextrose  50 % solution 0-50 mL (has no administration in time range)  enoxaparin  (LOVENOX ) injection 40 mg (40 mg Subcutaneous Given 09/25/24 1523)  OLANZapine  zydis (ZYPREXA ) disintegrating tablet 5 mg (5 mg Oral Given 09/25/24 1523)  lactated ringers  bolus 1,000 mL (0 mLs Intravenous Stopped 09/25/24 1518)  lactated ringers  bolus 2,276 mL (1,276 mLs Intravenous New Bag/Given 09/25/24 1302)    Clinical Course as of 09/25/24 1550  Fri Sep 25, 2024  1330 Discussed with admitting hospitalist team.  Concern for profound acidemia in the setting of DKA.  Discussed with Dr. Claudene (ICU) who will evaluate patient in the ED [MP]  1427 Dr. Theophilus (ICU) has evaluated patient here in the ED and feels he would be appropriate for medicine admission.  Dr. Georgina (hospitalist) is in agreement and will evaluate the patient in the ED for admission [MP]    Clinical Course User Index [MP] Pamella Ozell LABOR, DO                                 Medical Decision Making 29 year old male with history as above presenting for recurrent DKA.  Recently admitted in December for DKA.  Has not been compliant with insulin  regimen only his fast acting this morning.  Tachypneic tachycardic normotensive.  Awake alert no recent infection.  Likely triggered by noncompliance.  Will obtain DKA workup provide IV fluids LR bolus for rehydration, started on insulin  drip and replete potassium as necessary.  Will plan for admission  Amount and/or Complexity  of Data Reviewed Labs: ordered. Radiology: ordered.  Risk Prescription drug management. Decision regarding hospitalization.        Final diagnoses:  Diabetic ketoacidosis without coma associated with type 2 diabetes mellitus [facility])    ED Discharge Orders     None          Pamella Ozell LABOR, DO 09/25/24 1550  "

## 2024-09-25 NOTE — Plan of Care (Signed)
 Hospital Medicine Transfer Accept Note Patient Name/Age: Ryan Bartlett / 28 y.o. MRN: [id number] Admission Date: 09/25/2024  MD paged by EDP to evaluate patient; however, after discussion with EDP who mentioned pH <7.2, I advised re-triaged to MICU. Will await CCM input.  Marsha Ada, MD Attending Physician Division of [facility] Medicine [facility] September 25, 2024 1:34 PM
